# Patient Record
Sex: Male | Born: 1970 | Race: White | Hispanic: No | Smoking: Former smoker
Health system: Southern US, Community
[De-identification: ages and names within clinical notes are randomized; demographics above are authoritative.]

## PROBLEM LIST (undated history)

## (undated) DIAGNOSIS — Z800 Family history of malignant neoplasm of digestive organs: Secondary | ICD-10-CM

## (undated) DIAGNOSIS — R12 Heartburn: Secondary | ICD-10-CM

## (undated) DIAGNOSIS — R309 Painful micturition, unspecified: Secondary | ICD-10-CM

## (undated) DIAGNOSIS — D494 Neoplasm of unspecified behavior of bladder: Secondary | ICD-10-CM

## (undated) DIAGNOSIS — R42 Dizziness and giddiness: Secondary | ICD-10-CM

## (undated) DIAGNOSIS — C689 Malignant neoplasm of urinary organ, unspecified: Secondary | ICD-10-CM

## (undated) DIAGNOSIS — Z808 Family history of malignant neoplasm of other organs or systems: Secondary | ICD-10-CM

## (undated) DIAGNOSIS — R001 Bradycardia, unspecified: Secondary | ICD-10-CM

## (undated) DIAGNOSIS — F329 Major depressive disorder, single episode, unspecified: Secondary | ICD-10-CM

## (undated) DIAGNOSIS — F32A Depression, unspecified: Secondary | ICD-10-CM

## (undated) DIAGNOSIS — R972 Elevated prostate specific antigen [PSA]: Secondary | ICD-10-CM

## (undated) DIAGNOSIS — R319 Hematuria, unspecified: Secondary | ICD-10-CM

## (undated) DIAGNOSIS — C801 Malignant (primary) neoplasm, unspecified: Secondary | ICD-10-CM

## (undated) DIAGNOSIS — F419 Anxiety disorder, unspecified: Secondary | ICD-10-CM

## (undated) DIAGNOSIS — C61 Malignant neoplasm of prostate: Secondary | ICD-10-CM

## (undated) DIAGNOSIS — Z8042 Family history of malignant neoplasm of prostate: Secondary | ICD-10-CM

## (undated) HISTORY — DX: Family history of malignant neoplasm of digestive organs: Z80.0

## (undated) HISTORY — DX: Family history of malignant neoplasm of prostate: Z80.42

## (undated) HISTORY — DX: Malignant (primary) neoplasm, unspecified: C80.1

## (undated) HISTORY — DX: Family history of malignant neoplasm of other organs or systems: Z80.8

## (undated) HISTORY — DX: Dizziness and giddiness: R42

## (undated) HISTORY — DX: Malignant neoplasm of urinary organ, unspecified: C68.9

## (undated) HISTORY — DX: Bradycardia, unspecified: R00.1

---

## 1999-10-27 ENCOUNTER — Encounter: Payer: Self-pay | Admitting: Emergency Medicine

## 1999-10-28 ENCOUNTER — Inpatient Hospital Stay (HOSPITAL_COMMUNITY): Admission: EM | Admit: 1999-10-28 | Discharge: 1999-10-29 | Payer: Self-pay | Admitting: Emergency Medicine

## 1999-10-28 ENCOUNTER — Encounter: Payer: Self-pay | Admitting: Emergency Medicine

## 2003-09-14 ENCOUNTER — Emergency Department (HOSPITAL_COMMUNITY): Admission: EM | Admit: 2003-09-14 | Discharge: 2003-09-15 | Payer: Self-pay | Admitting: Emergency Medicine

## 2003-09-29 ENCOUNTER — Ambulatory Visit (HOSPITAL_COMMUNITY): Admission: RE | Admit: 2003-09-29 | Discharge: 2003-09-29 | Payer: Self-pay | Admitting: Neurological Surgery

## 2006-11-07 ENCOUNTER — Ambulatory Visit (HOSPITAL_COMMUNITY): Admission: RE | Admit: 2006-11-07 | Discharge: 2006-11-07 | Payer: Self-pay | Admitting: General Surgery

## 2006-11-07 ENCOUNTER — Encounter (INDEPENDENT_AMBULATORY_CARE_PROVIDER_SITE_OTHER): Payer: Self-pay | Admitting: General Surgery

## 2008-05-15 ENCOUNTER — Ambulatory Visit: Payer: Self-pay | Admitting: Vascular Surgery

## 2010-01-16 HISTORY — PX: LEG SURGERY: SHX1003

## 2010-01-16 HISTORY — PX: BACK SURGERY: SHX140

## 2010-03-06 ENCOUNTER — Emergency Department (HOSPITAL_COMMUNITY): Payer: PRIVATE HEALTH INSURANCE

## 2010-03-06 ENCOUNTER — Emergency Department (HOSPITAL_COMMUNITY)
Admission: EM | Admit: 2010-03-06 | Discharge: 2010-03-06 | Disposition: A | Discharge status: Other Acute Inpatient Hospital | Payer: PRIVATE HEALTH INSURANCE | Attending: Emergency Medicine | Admitting: Emergency Medicine

## 2010-03-06 ENCOUNTER — Inpatient Hospital Stay (HOSPITAL_COMMUNITY)
Admission: EM | Admit: 2010-03-06 | Discharge: 2010-03-14 | DRG: 516 | Disposition: A | Discharge status: Home / Self Care | Payer: PRIVATE HEALTH INSURANCE | Attending: General Surgery | Admitting: General Surgery

## 2010-03-06 ENCOUNTER — Encounter: Payer: Self-pay | Admitting: Orthopedic Surgery

## 2010-03-06 DIAGNOSIS — D62 Acute posthemorrhagic anemia: Secondary | ICD-10-CM | POA: Diagnosis present

## 2010-03-06 DIAGNOSIS — F1910 Other psychoactive substance abuse, uncomplicated: Secondary | ICD-10-CM | POA: Diagnosis present

## 2010-03-06 DIAGNOSIS — T148XXA Other injury of unspecified body region, initial encounter: Secondary | ICD-10-CM

## 2010-03-06 DIAGNOSIS — S73016A Posterior dislocation of unspecified hip, initial encounter: Secondary | ICD-10-CM

## 2010-03-06 DIAGNOSIS — S32409A Unspecified fracture of unspecified acetabulum, initial encounter for closed fracture: Principal | ICD-10-CM | POA: Diagnosis present

## 2010-03-06 DIAGNOSIS — S73006A Unspecified dislocation of unspecified hip, initial encounter: Secondary | ICD-10-CM | POA: Insufficient documentation

## 2010-03-06 DIAGNOSIS — S329XXA Fracture of unspecified parts of lumbosacral spine and pelvis, initial encounter for closed fracture: Secondary | ICD-10-CM | POA: Insufficient documentation

## 2010-03-06 DIAGNOSIS — S52123A Displaced fracture of head of unspecified radius, initial encounter for closed fracture: Secondary | ICD-10-CM | POA: Diagnosis present

## 2010-03-06 DIAGNOSIS — S5290XA Unspecified fracture of unspecified forearm, initial encounter for closed fracture: Secondary | ICD-10-CM | POA: Insufficient documentation

## 2010-03-06 DIAGNOSIS — Y92410 Unspecified street and highway as the place of occurrence of the external cause: Secondary | ICD-10-CM | POA: Insufficient documentation

## 2010-03-06 DIAGNOSIS — IMO0002 Reserved for concepts with insufficient information to code with codable children: Secondary | ICD-10-CM | POA: Diagnosis present

## 2010-03-06 DIAGNOSIS — Z7901 Long term (current) use of anticoagulants: Secondary | ICD-10-CM

## 2010-03-06 LAB — BASIC METABOLIC PANEL
CO2: 25 mEq/L (ref 19–32)
Chloride: 104 mEq/L (ref 96–112)
GFR calc Af Amer: >60 mL/min (ref >60)
Potassium: 4.1 mEq/L (ref 3.5–5.1)
Sodium: 136 mEq/L (ref 135–145)

## 2010-03-06 LAB — DIFFERENTIAL
Basophils Absolute: 0.1 10*3/uL (ref 0.0–0.1)
Basophils Relative: 0 % (ref 0–1)
Eosinophils Absolute: 0.1 10*3/uL (ref 0.0–0.7)
Monocytes Relative: 8 % (ref 3–12)
Neutro Abs: 14.3 10*3/uL — ABNORMAL HIGH (ref 1.7–7.7)
Neutrophils Relative %: 81 % — ABNORMAL HIGH (ref 43–77)

## 2010-03-06 LAB — APTT: aPTT: 28 seconds (ref 24–37)

## 2010-03-06 LAB — CBC
Hemoglobin: 14.1 g/dL (ref 13.0–17.0)
MCH: 33 pg (ref 26.0–34.0)
Platelets: 218 10*3/uL (ref 150–400)
RBC: 4.27 MIL/uL (ref 4.22–5.81)
WBC: 17.7 10*3/uL — ABNORMAL HIGH (ref 4.0–10.5)

## 2010-03-06 LAB — PROTIME-INR
INR: 0.93 (ref 0.00–1.49)
Prothrombin Time: 12.7 seconds (ref 11.6–15.2)

## 2010-03-06 LAB — TYPE AND SCREEN
ABO/RH(D): O POS
Antibody Screen: NEGATIVE

## 2010-03-06 MED ORDER — IOHEXOL 300 MG/ML  SOLN
100.0000 mL | Freq: Once | INTRAMUSCULAR | Status: AC | PRN
  Administered 2010-03-06: 100 mL via INTRAVENOUS

## 2010-03-07 ENCOUNTER — Other Ambulatory Visit (HOSPITAL_COMMUNITY): Payer: PRIVATE HEALTH INSURANCE

## 2010-03-07 ENCOUNTER — Encounter (HOSPITAL_COMMUNITY): Payer: Self-pay

## 2010-03-07 ENCOUNTER — Inpatient Hospital Stay (HOSPITAL_COMMUNITY): Payer: PRIVATE HEALTH INSURANCE

## 2010-03-07 LAB — CBC
Hemoglobin: 13.4 g/dL (ref 13.0–17.0)
MCH: 32.4 pg (ref 26.0–34.0)
MCHC: 33.8 g/dL (ref 30.0–36.0)
RDW: 12.6 % (ref 11.5–15.5)

## 2010-03-07 LAB — BASIC METABOLIC PANEL
CO2: 25 mEq/L (ref 19–32)
Calcium: 8.7 mg/dL (ref 8.4–10.5)
Creatinine, Ser: 0.91 mg/dL (ref 0.4–1.5)
GFR calc Af Amer: >60 mL/min (ref >60)
GFR calc non Af Amer: >60 mL/min (ref >60)
Glucose, Bld: 110 mg/dL — ABNORMAL HIGH (ref 70–99)
Sodium: 136 mEq/L (ref 135–145)

## 2010-03-08 ENCOUNTER — Inpatient Hospital Stay (HOSPITAL_COMMUNITY): Payer: PRIVATE HEALTH INSURANCE

## 2010-03-08 LAB — CBC
Hemoglobin: 13.6 g/dL (ref 13.0–17.0)
MCH: 32.9 pg (ref 26.0–34.0)
MCHC: 33.8 g/dL (ref 30.0–36.0)
Platelets: 191 10*3/uL (ref 150–400)
RBC: 4.14 MIL/uL — ABNORMAL LOW (ref 4.22–5.81)

## 2010-03-08 LAB — ABO/RH: ABO/RH(D): O POS

## 2010-03-08 LAB — MRSA PCR SCREENING: MRSA by PCR: NEGATIVE

## 2010-03-10 LAB — BASIC METABOLIC PANEL
BUN: 10 mg/dL (ref 6–23)
Calcium: 8.5 mg/dL (ref 8.4–10.5)
GFR calc non Af Amer: >60 mL/min (ref >60)
Glucose, Bld: 99 mg/dL (ref 70–99)
Sodium: 133 mEq/L — ABNORMAL LOW (ref 135–145)

## 2010-03-10 LAB — CBC
HCT: 31.9 % — ABNORMAL LOW (ref 39.0–52.0)
MCHC: 34.2 g/dL (ref 30.0–36.0)
Platelets: 186 10*3/uL (ref 150–400)
RDW: 12.3 % (ref 11.5–15.5)
WBC: 10.6 10*3/uL — ABNORMAL HIGH (ref 4.0–10.5)

## 2010-03-11 LAB — PROTIME-INR: Prothrombin Time: 14.5 seconds (ref 11.6–15.2)

## 2010-03-12 LAB — CROSSMATCH
ABO/RH(D): O POS
Unit division: 0

## 2010-03-12 LAB — PROTIME-INR: Prothrombin Time: 15.4 seconds — ABNORMAL HIGH (ref 11.6–15.2)

## 2010-03-14 LAB — CBC
HCT: 30.8 % — ABNORMAL LOW (ref 39.0–52.0)
MCH: 32.7 pg (ref 26.0–34.0)
MCV: 96 fL (ref 78.0–100.0)
RBC: 3.21 MIL/uL — ABNORMAL LOW (ref 4.22–5.81)
WBC: 8.4 10*3/uL (ref 4.0–10.5)
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
## 2010-03-18 NOTE — Consult Note (Signed)
  NAMEKELVIN, [name]:  [id number]  MEDICAL RECORD NO.:  [id number]           PATIENT TYPE:  LOCATION:                                 FACILITY:  PHYSICIAN:  Estill Bamberg, MD      DATE OF BIRTH:  1970/06/10  DATE OF CONSULTATION:  03/07/2010 DATE OF DISCHARGE:                                CONSULTATION   PREPROCEDURE DIAGNOSIS:  Unstable right posterior wall acetabular fracture dislocation.  POSTPROCEDURE DIAGNOSIS:  Unstable right posterior wall acetabular fracture dislocation.  PROCEDURE:  Skeletal traction pin placement right proximal tibia.  SURGEON:  Estill Bamberg, MD  CLINICIAN:  Mearl Latin, PA  COMPLICATIONS:  None.  Explained risks and benefits of skeletal traction with the patient.  The patient wished to proceed.  Identified, approach to the lateral proximal tibia.  Distal and posterior tibial tuberosity area cleaned with alcohol swab and Betadine swabs x3 both laterally and medially, then 6 mL of 1% lidocaine was infiltrated laterally.  After adequate anesthesia, at the soft tissue a 15-blade was used to make small vertical incisions for easier K-wire insertion.  A 0.062 K-wire was selected and inserted into the proximal tibia going lateral to medial, then traction bow was applied and 20 pounds applied to the traction bow creating in-line traction with slight hip flexion, abduction and external rotation to maximize stable position.  The patient tolerated the procedure well. Postprocedure examination demonstrated intact deep peroneal nerve, superficial peroneal nerve, and tibial nerve motor and sensory function. Palpable dorsalis pedis and posterior tibialis pulses were noted.  The patient had immediate onset of relief after weight was applied.  DISPOSITION:  Check CT scan of pelvis for postreduction evaluation of the acetabulum, OR tomorrow for ORIF of right acetabulum.     Mearl Latin,  PA   ______________________________ Estill Bamberg, MD   KWP/MEDQ  D:  03/07/2010  T:  03/08/2010  Job:  [id number]  Electronically Signed by Montez Morita PA on 03/15/2010 05:24:17 PM Electronically Signed by Loraine Leriche Ozan Maclay  on 03/18/2010 08:14:08 AM

## 2010-03-22 NOTE — Op Note (Addendum)
  Rodney [name], ENBERG NO.:  [id number]  MEDICAL RECORD NO.:  [id number]           PATIENT TYPE:  E  LOCATION:  APED                          FACILITY:  [facility]  PHYSICIAN:  Vickki Hearing, M.D.DATE OF BIRTH:  February 20, 1970  DATE OF PROCEDURE:  03/06/2010 DATE OF DISCHARGE:  03/06/2010                              OPERATIVE REPORT   PREOPERATIVE DIAGNOSIS:  Posterior fracture dislocation of the right hip.  PREOPERATIVE DIAGNOSIS:  Posterior fracture dislocation of the right hip.  PROCEDURE:  Closed reduction of the right hip dislocation in the emergency room under conscious sedation.  Anesthesia used was 50 of propofol and 3 of Versed.  Surgeon was Dr. Romeo Apple.  PREREDUCTION FINDINGS:  Posterior dislocation of the hip.  Neurovascular status of the limb was normal.  The patient held a limit flexion and internal rotation.  The limb was shortened.  The consent was obtained with a family member.  We did a time-out to check his name and then 3 mg of Versed was used and a 50 of propofol was used and his vitals were monitored  We then internally rotated the hip, adducted the hip, full traction, and the hip reduced with an audible clunk.  Leg lengths came back to neutral.  Postreduction film was obtained which showed improvement of position of the hip with the [facility] pelvis, we did not take a lateral.  We did not do a exam under anesthesia due to the timing.  The patient is being transported to [facility] where he can have a postreduction CT scan at the treating physician's discretion.    Vickki Hearing, M.D.    SEH/MEDQ  D:  03/06/2010  T:  03/07/2010  Job:  [id number]  Electronically Signed by Fuller Canada M.D. on 03/22/2010 11:40:31 AM Electronically Signed by Fuller Canada M.D. on 03/22/2010 11:55:19 AM Electronically Signed by Fuller Canada M.D. on 03/22/2010 12:09:47 PM Electronically Signed by Fuller Canada M.D. on 03/22/2010  12:25:20 PM Electronically Signed by Fuller Canada M.D. on 03/22/2010 12:41:55 PM Electronically Signed by Fuller Canada M.D. on 03/22/2010 01:00:02 PM Electronically Signed by Fuller Canada M.D. on 03/22/2010 01:19:31 PM Electronically Signed by Fuller Canada M.D. on 03/22/2010 01:40:57 PM Electronically Signed by Fuller Canada M.D. on 03/22/2010 02:00:59 PM Electronically Signed by Fuller Canada M.D. on 03/22/2010 02:21:15 PM Electronically Signed by Fuller Canada M.D. on 03/22/2010 02:43:37 PM Electronically Signed by Fuller Canada M.D. on 03/22/2010 03:07:08 PM Electronically Signed by Fuller Canada M.D. on 03/22/2010 03:31:59 PM Electronically Signed by Fuller Canada M.D. on 03/22/2010 04:06:08 PM Electronically Signed by Fuller Canada M.D. on 03/22/2010 04:37:33 PM Electronically Signed by Fuller Canada M.D. on 03/22/2010 05:09:04 PM Electronically Signed by Fuller Canada M.D. on 03/22/2010 05:09:04 PM Electronically Signed by Fuller Canada M.D. on 03/22/2010 05:37:49 PM Electronically Signed by Fuller Canada M.D. on 03/22/2010 06:27:05 PM Electronically Signed by Fuller Canada M.D. on 03/22/2010 07:32:31 PM
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
## 2010-03-29 NOTE — Consult Note (Signed)
Summary: Hospital consult RT posterior fx dislocation  Hospital consult RT posterior fx dislocation   Imported By: Cammie Sickle 03/22/2010 19:20:49  _____________________________________________________________________  External Attachment:    Type:   Image     Comment:   External Document
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
## 2010-05-31 NOTE — H&P (Signed)
Rodney [name], CANADY NO.:  [id number]   MEDICAL RECORD NO.:  [id number]          PATIENT TYPE:  AMB   LOCATION:  DAY                           FACILITY:  [facility]   PHYSICIAN:  Dalia Heading, M.D.  DATE OF BIRTH:  04-25-1970   DATE OF ADMISSION:  DATE OF DISCHARGE:  LH                              HISTORY & PHYSICAL   CHIEF COMPLAINT:  Anal condyloma.   HISTORY OF PRESENT ILLNESS:  The patient is a 40 year old white male who  is referred for evaluation and treatment of perianal condyloma.  He has  been followed for several years now.  Condylox does not help.  He now  presents for laser fulguration of the anal condyloma.   PAST MEDICAL HISTORY:  Includes chronic back problems.   PAST SURGICAL HISTORY:  Back surgery.   CURRENT MEDICATIONS:  Flexeril, Nebutane.   ALLERGIES:  NO KNOWN DRUG ALLERGIES.   REVIEW OF SYSTEMS:  This patient smokes a pack and a half of cigarettes  daily.  He denies any significant alcohol use.  He denies any other  __________  pulmonary difficulties or bleeding disorders.   PHYSICAL EXAMINATION:  GENERAL:  On physical admission, the patient is a  well-developed, nourished white male in no acute distress.  LUNGS:  Clear to auscultation with equal breath sounds bilaterally.  HEART:  Examination reveals a regular rate and rhythm without S3, S4, or  murmurs.  ABDOMEN:  The abdomen is benign.  RECTAL:  Examination reveals condyloma around the anal verge.   IMPRESSION:  Chronic anal condyloma.  The patient is scheduled for laser  fulguration of the anal condyloma on November 07, 2006.  Risks and  benefits of the procedure including bleeding, infection, and recurrence  of the condyloma were fully explained to the patient, gave informed  consent.      Dalia Heading, M.D.  Electronically Signed     MAJ/MEDQ  D:  11/01/2006  T:  11/02/2006  Job:  [id number]   cc:   Dalia Heading, M.D.  Fax: [phone]

## 2010-05-31 NOTE — Op Note (Signed)
NAMEHAYDYN, Rodney [name]:  [id number]   MEDICAL RECORD NO.:  [id number]          PATIENT TYPE:  AMB   LOCATION:  DAY                           FACILITY:  [facility]   PHYSICIAN:  Dalia Heading, M.D.  DATE OF BIRTH:  [birth year]   DATE OF PROCEDURE:  11/07/2006  DATE OF DISCHARGE:                               OPERATIVE REPORT   PREOPERATIVE DIAGNOSIS:  Perianal condyloma.   POSTOPERATIVE DIAGNOSIS:  Perianal condyloma.   PROCEDURE:  Laser fulguration of anal condyloma.   SURGEON:  Dalia Heading, M.D.   ANESTHESIA:  General endotracheal.   INDICATIONS:  The patient is a 40 year old white male who presents with  a perianal condyloma.  The risks and benefits of the procedure including  the recurrence of the condyloma were fully explained to the patient who  gave informed consent.   PROCEDURE NOTE:  The patient was placed in the lithotomy position.  After general anesthesia was administered, the perineum was prepped and  draped using the usual sterile technique.  Dampened towels were placed  around the anal region.  Surgical site confirmation was performed.   The patient had multiple condyloma in the perianal region as well as the  anal verge.  Using both Bovie electrocautery for the larger lesions and  the Holmium laser, the areas were fulgurated without difficulty.  No  abnormal bleeding was noted at the end of the procedure.  The patient  was awakened in the operating room and went back to the recovery room  awake in stable condition.   COMPLICATIONS:  None.   SPECIMEN:  Perianal condyloma.   BLOOD LOSS:  Minimal.      Dalia Heading, M.D.  Electronically Signed    MAJ/MEDQ  D:  11/07/2006  T:  11/07/2006  Job:  [id number]
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
## 2010-06-03 NOTE — Op Note (Signed)
Rodney [name], Rodney [name]                           ACCOUNT NO.:  [id number]   MEDICAL RECORD NO.:  [id number]                   PATIENT TYPE:  OIB   LOCATION:  2899                                 FACILITY:  [facility]   PHYSICIAN:  Stefani Dama, M.D.               DATE OF BIRTH:  Sep 25, 1970   DATE OF PROCEDURE:  09/29/2003  DATE OF DISCHARGE:                                 OPERATIVE REPORT   PREOPERATIVE DIAGNOSES:  Herniated nucleus pulposus at L5-S1, with right S1  radiculopathy.   POSTOPERATIVE DIAGNOSES:  Herniated nucleus pulposus at L5-S1, with right S1  radiculopathy.   PROCEDURE:  L5-S1 MET-RX laminotomy and diskectomy, with operating  microscope, microdissection technique.   SURGEON:  Stefani Dama, M.D.   ASSISTANT:  Hilda Lias, M.D.   ANESTHESIA:  General endotracheal.   INDICATIONS FOR PROCEDURE:  The patient is a 40 year old individual who has  had significant back and right lower extremity pain that came on acutely a  couple of weeks ago.  He has been refractory to conservative measures, and  has been taking large doses of narcotic pain medication, to maintain some  level of comfort.  He has some modest weakness in the gastrocnemius on the  right side, and after careful evaluation, he was advised regarding surgical  decompression of the L5-S1 space on the right side with the use of MET-RX  diskectomy.  He has had a number of previous back operations and has been on  disability because of these problems.   DESCRIPTION OF PROCEDURE:  The patient was brought to the operating room  supine on the stretcher.  After the smooth induction of general endotracheal  anesthesia, he was turned prone.  His back was prepped with Hibiclens and  alcohol, and then draped in a sterile fashion.  Using fluoroscopic guidance  then the L5-S1 space was localized on the left side, and the skin was marked  over the appropriate areas.  It was infiltrated with 1% lidocaine with  epinephrine mixed 50:50 with 0.5% Marcaine.  The skin was incised and the K-  wire was then passed to the laminar arch of L5 using a wanding technique.  A  series of dilators were then passed into this area to allow for the  placement of an 18 mm x 5 cm deep endoscopic cannula, which was affixed to  the operating room table.  The position of the cannula was directly over the  L5-S1 interspace on that right side.  Then the microscope was draped and  brought into the field and through the microscopic aperture, the soft  tissues were dissected from the laminar arch and the internal laminar space.  The 2 and 3 mm Kerrison punches were then used to elevate the laminar arch  and to carefully dissect the redundant yellow ligament under this region.  Under this the common dural tube was identified on the lateral  aspect.  The  S1 nerve root was noted to be dorsally.  The nerve root was invested with a  fair amount of scar tissue, as though there had been a previous laminotomy  in this area.  This was dissected out to the lateral aspect of the nerve  root, and then by working from lateral to medial, the nerve root was well-  protected, dissected carefully, and elevated and turned medially.  The  underlying surface was then identified as a glistening whitish area which  was incised.  Through this aperture then a large fragment of disk material  was removed, which allowed for immediate decompression of the common dural  tube and the nerve root.  Further dissection revealed a second fragment,  which was somewhat smaller.  Further exploration revealed no other fragments  of disk; however, there was an opening into the disk space.  This was  enlarged again with a #15 blade, and a combination of curets and rongeurs  were used to evacuate the disk space at L5-S1 of a substantial quantity of  severely degenerated disk material.  In the end, the space was free and  clear of any disk material.  The common dural  tube and the S1 nerve root,  which were quite separate in the way they laid, were well-decompressed.  Hemostasis in the soft tissues was achieved, and then the endoscopic cannula  was removed.  The fascia was closed with singular #3-0 Vicryl suture, and #3-  0 Vicryl was used in the subcuticular skin.  The patient tolerated the procedure well and was returned to the recovery  room.  The procedure was done with the help of Dr. Jeral Fruit, who provided some  retraction to the common dural tube and the S1 nerve root.  He also  performed the lateral portion of the dissection, decompressing the  undersurface of the S1 nerve root towards the lateral aspect of the  exposure.                                               Stefani Dama, M.D.    [name]  D:  09/29/2003  T:  09/29/2003  Job:  [id number]

## 2010-06-03 NOTE — H&P (Signed)
[place]  Patient:    Rodney [name], Rodney [name]                        MRN: [id number] Adm. Date:  [id number] Disc. Date: [id number] Attending:  Molpus, John L                         History and Physical  ADMITTING DIAGNOSIS:  Herniated nucleus pulposus L5-S1 right with right lumbar radiculopathy.  HISTORY OF PRESENT ILLNESS:  The patient is a 40 year old right-handed individual who has had back pain since a work-related injury on August 12. He was seen by Dr. Annett Gula in [place] and told that he had primarily musculoligamentous strain. He was treated with bed rest and some medications. About a week and a half ago he started to develop severe and excruciating pain in his right lower extremity. This has progressed to the point where he came to the emergency room, because he could not obtain any relief. He had called our office and obtained an appointment with Dr. Channing Mutters, to be seen the following week. However, the patient notes that he cannot bear any weight, he cannot move about at all. He denies bowel or bladder control problems, but he has been essentially incapacitated. Yesterday, in the emergency room, an MRI of the lumbar spine was performed, and this demonstrates the presence of postsurgical changes on the left at L4-5, and a new moderate to large size disc herniation on the right at L5-S1.  PAST MEDICAL HISTORY:  His general health has been good.  PAST SURGICAL HISTORY:  His only other surgery has been a laminotomy and discectomy in 1997, and a vasectomy ten years ago.  ALLERGIES:  He notes no allergies to any medications.  MEDICATIONS:  He uses no medications on a chronic basis.  SOCIAL HISTORY:  He is engaged to be married. 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 In fact, a work-related injury was while he was using a jackhammer.  FAMILY HISTORY:  Noncontributory for any significant problems with back problems or disc disease.  REVIEW OF  SYSTEMS:  Notable only for the items in the history of present illness. He denies bowel or bladder control problems. He notes that he smokes about a pack of cigarettes a day. He denies any upper airway disease. A 14 point systems review is negative.  PHYSICAL EXAMINATION:  VITAL SIGNS:  Blood pressure 130/67, heart rate 54, respirations 16, temperature 97.1 degrees.  GENERAL:  He is a well-developed, well-nourished individual in moderate distress when he moves about with his back.  BACK:  He has a significant amount of paravertebral spasm and marked tenderness to palpation and percussion in his paravertebral muscles.  NEUROLOGIC:  When attempting to stand him he will not bear weight on his right lower extremity. He has a severe amount of paravertebral spasm on the right side with a functional scoliosis. His motor strength in the lower extremities reveals the iliopsoas, quadriceps, tibialis anterior and gastrocnemius to have good strength to confrontational testing. However, his Achilles reflex on the right side is absent. His patellar reflexes are 2+. Upper extremity reflexes are 2+, upper extremity strength and reflexes are otherwise normal. Cranial nerve examination reveals his pupils are 4 mm, brisk and reactive to light and accommodation. Extraocular movements are full. The face is symmetric to grimace. The tongue and uvula are in the midline. Sclera and conjunctiva are clear. Fundi reveal the disks to  be flat bilaterally.  HEART:  Regular rate and rhythm. No murmurs are heard.  LUNGS:  Clear to auscultation.  NECK:  No masses. No bruits heard.  EXTREMITIES:  Reveal no cyanosis, clubbing or edema, otherwise.  IMPRESSION:  The patient has evidence of herniated nucleus pulposus at L5-S1 on the right side.  PLAN:  He is now being admitted and will likely require surgical intervention emergently as he has not had any relief with parenteral medications, and he has been  progressively worsening over the past week and a half time at home. DD:  10/28/99 TD:  10/28/99 Job: [id number]

## 2010-06-03 NOTE — Op Note (Signed)
[place]  Patient:    Rodney [name], Rodney [name]                        MRN: [id number] Proc. Date: 10/28/99 Adm. Date:  [id number] Disc. Date: [id number] Attending:  Molpus, John L                           Operative Report  PREOPERATIVE DIAGNOSIS:  L5-S1 herniated nucleus pulposus on the right, with right lumbar radiculopathy.  POSTOPERATIVE DIAGNOSIS:  L5-S1 herniated nucleus pulposus on the right, with right lumbar radiculopathy.  PROCEDURE:  Right lumbar microdiskectomy, with microdissection technique and surgical microscope, L5-S1 right.  SURGEON:  Stefani Dama, M.D.  FIRST ASSISTANT:  Danae Orleans. Venetia Maxon, M.D.  ANESTHESIA:  General endotracheal.  INDICATIONS:  The patient is a 40 year old individual who has had significant back and right lower extremity pain.  He came to the emergency room yesterday complaining that he could not walk.  An MRI demonstrated the presence of a herniated nucleus pulposus at L5-S1 on the right side.  He was advised regarding surgery.  DESCRIPTION OF PROCEDURE:  The patient was brought to the operating room supine on a stretcher.  After a smooth induction of general endotracheal anesthesia he was turned onto the back, shaved and prepped with Duraprep and draped in a sterile fashion.  A midline incision was created and carried down to the lumbodorsal fascia on the right side.  The fascia was opened on the right side over a low spinous process.  The interlaminar space was cleared, and the L5-S1 space was identified positively, after needle placement identified the L4 interlaminar space.  McCullough retractor was used.  The dissection was extended to include area around the facet.  The margin of the lamina of L5 was then cleared using a Midas Rex and an AM-3 bur.  The yellow ligament was taken up in this region, and the interlaminar space and the common dural tube was identified.  The area was explored by using  microdissection technique, with the operating microscope to relieve epidural veins in this region and expose the common dural tube and the L5-S1 nerve root.  The S1 nerve root was noted to be bulged over a mass ventral to it.  When this was dissected free and moved medially, the mass was noted to be a portion of disk.  After some dissection, several fragments of disk were removed from outside the disk space and the free space underneath the nerve.  Further dissection identified that there was significant disk material that was loose within the disk space.  The disk space was cleaned with a combination of curettage and rongeurs, used to remove significant quantity of degenerative disk material from within it.  Once this was cleared, the area was copiously irrigated with antibiotic irrigating solution.  Several other small fragments of disk were identified laterally.  These were cleared using a combination of curets and punches. Then hemostasis was again achieved.  The area was then copiously irrigated again.  Hemostasis was doubly checked and then the microscope was brought out of the field, and the lumbodorsal fascia was closed with #1 Vicryl in interrupted fashion; 3-0 Vicryl was used in the subcuticular tissues.  A clear plastic dressing was placed on the skin.  The patient tolerated the procedure well.  Blood loss was estimated at less than 50 cc.DD:  10/28/99 TD:  10/30/99 Job: [id number]  [id number]

## 2010-10-26 LAB — BASIC METABOLIC PANEL
CO2: 28
Chloride: 102
GFR calc Af Amer: >60
Glucose, Bld: 92
Sodium: 136

## 2010-10-26 LAB — CBC
HCT: 41.2
Hemoglobin: 14.4
MCHC: 35
MCV: 96.7
RBC: 4.26

## 2011-06-30 ENCOUNTER — Encounter: Payer: Self-pay | Admitting: *Deleted

## 2014-09-18 ENCOUNTER — Encounter (HOSPITAL_COMMUNITY): Payer: Self-pay | Admitting: Emergency Medicine

## 2014-09-18 ENCOUNTER — Emergency Department (HOSPITAL_COMMUNITY): Payer: PRIVATE HEALTH INSURANCE

## 2014-09-18 ENCOUNTER — Emergency Department (HOSPITAL_COMMUNITY)
Admission: EM | Admit: 2014-09-18 | Discharge: 2014-09-18 | Disposition: A | Discharge status: Home / Self Care | Payer: PRIVATE HEALTH INSURANCE | Attending: Emergency Medicine | Admitting: Emergency Medicine

## 2014-09-18 DIAGNOSIS — S300XXD Contusion of lower back and pelvis, subsequent encounter: Secondary | ICD-10-CM | POA: Diagnosis not present

## 2014-09-18 DIAGNOSIS — W132XXD Fall from, out of or through roof, subsequent encounter: Secondary | ICD-10-CM | POA: Diagnosis not present

## 2014-09-18 DIAGNOSIS — Z87891 Personal history of nicotine dependence: Secondary | ICD-10-CM | POA: Insufficient documentation

## 2014-09-18 DIAGNOSIS — Z9889 Other specified postprocedural states: Secondary | ICD-10-CM | POA: Insufficient documentation

## 2014-09-18 DIAGNOSIS — S3992XD Unspecified injury of lower back, subsequent encounter: Secondary | ICD-10-CM | POA: Diagnosis present

## 2014-09-18 DIAGNOSIS — S3993XA Unspecified injury of pelvis, initial encounter: Secondary | ICD-10-CM

## 2014-09-18 MED ORDER — DIAZEPAM 5 MG PO TABS
10.0000 mg | ORAL_TABLET | Freq: Once | ORAL | Status: AC
  Administered 2014-09-18: 10 mg via ORAL
  Filled 2014-09-18: qty 2

## 2014-09-18 MED ORDER — OXYCODONE-ACETAMINOPHEN 5-325 MG PO TABS
2.0000 | ORAL_TABLET | Freq: Once | ORAL | Status: DC
  Filled 2014-09-18: qty 2

## 2014-09-18 MED ORDER — KETOROLAC TROMETHAMINE 10 MG PO TABS
10.0000 mg | ORAL_TABLET | Freq: Once | ORAL | Status: AC
  Administered 2014-09-18: 10 mg via ORAL
  Filled 2014-09-18: qty 1

## 2014-09-18 MED ORDER — IOHEXOL 300 MG/ML  SOLN: 75.0000 mL | Freq: Once | INTRAMUSCULAR | Status: DC | PRN
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
## 2017-06-21 ENCOUNTER — Other Ambulatory Visit: Payer: Self-pay | Admitting: Urology

## 2017-06-28 NOTE — Patient Instructions (Addendum)
Rodney [name]  06/28/2017   Your procedure is scheduled on: 07-02-17    Report to [facility] Main  Entrance    Report to admitting at 2:30PM    Call this number if you have problems the morning of surgery [phone]     Remember: Do not eat food After Midnight. You may have clear liquids from midnight until 11am day of surgery . Nothing after 11am!      CLEAR LIQUID DIET   Foods Allowed                                                                     Foods Excluded  Coffee and tea, regular and decaf                             liquids that you cannot  Plain Jell-O in any flavor                                             see through such as: Fruit ices (not with fruit pulp)                                     milk, soups, orange juice  Iced Popsicles                                    All solid food Carbonated beverages, regular and diet                                    Cranberry, grape and apple juices Sports drinks like Gatorade Lightly seasoned clear broth or consume(fat free) Sugar, honey syrup  Sample Menu Breakfast                                Lunch                                     Supper Cranberry juice                    Beef broth                            Chicken broth Jell-O                                     Grape juice                           Apple  juice Coffee or tea                        Jell-O                                      Popsicle                                                Coffee or tea                        Coffee or tea  _____________________________________________________________________       Take these medicines the morning of surgery with A SIP OF WATER: none                                 You may not have any metal on your body including hair pins and              piercings  Do not wear jewelry, make-up, lotions, powders or perfumes, deodorant                          Men may shave face  and neck.   Do not bring valuables to the hospital. [place].  Contacts, dentures or bridgework may not be worn into surgery.       Patients discharged the day of surgery will not be allowed to drive home.  Name and phone number of your driver:  Special Instructions: N/A              Please read over the following fact sheets you were given: _____________________________________________________________________             [facility] - Preparing for Surgery Before surgery, you can play an important role.  Because skin is not sterile, your skin needs to be as free of germs as possible.  You can reduce the number of germs on your skin by washing with CHG (chlorahexidine gluconate) soap before surgery.  CHG is an antiseptic cleaner which kills germs and bonds with the skin to continue killing germs even after washing. Please DO NOT use if you have an allergy to CHG or antibacterial soaps.  If your skin becomes reddened/irritated stop using the CHG and inform your nurse when you arrive at Short Stay. Do not shave (including legs and underarms) for at least 48 hours prior to the first CHG shower.  You may shave your face/neck. Please follow these instructions carefully:  1.  Shower with CHG Soap the night before surgery and the  morning of Surgery.  2.  If you choose to wash your hair, wash your hair first as usual with your  normal  shampoo.  3.  After you shampoo, rinse your hair and body thoroughly to remove the  shampoo.                           4.  Use CHG as you would any other liquid  soap.  You can apply chg directly  to the skin and wash                       Gently with a scrungie or clean washcloth.  5.  Apply the CHG Soap to your body ONLY FROM THE NECK DOWN.   Do not use on face/ open                           Wound or open sores. Avoid contact with eyes, ears mouth and genitals (private parts).                       Wash face,   Genitals (private parts) with your normal soap.             6.  Wash thoroughly, paying special attention to the area where your surgery  will be performed.  7.  Thoroughly rinse your body with warm water from the neck down.  8.  DO NOT shower/wash with your normal soap after using and rinsing off  the CHG Soap.                9.  Pat yourself dry with a clean towel.            10.  Wear clean pajamas.            11.  Place clean sheets on your bed the night of your first shower and do not  sleep with pets. Day of Surgery : Do not apply any lotions/deodorants the morning of surgery.  Please wear clean clothes to the hospital/surgery center.  FAILURE TO FOLLOW THESE INSTRUCTIONS MAY RESULT IN THE CANCELLATION OF YOUR SURGERY PATIENT SIGNATURE_________________________________  NURSE SIGNATURE__________________________________  ________________________________________________________________________

## 2017-06-29 ENCOUNTER — Encounter (HOSPITAL_COMMUNITY)
Admission: RE | Admit: 2017-06-29 | Discharge: 2017-06-29 | Disposition: A | Discharge status: Home / Self Care | Payer: PRIVATE HEALTH INSURANCE | Source: Ambulatory Visit | Attending: Urology | Admitting: Urology

## 2017-06-29 ENCOUNTER — Encounter (HOSPITAL_COMMUNITY): Payer: Self-pay

## 2017-06-29 ENCOUNTER — Other Ambulatory Visit: Payer: Self-pay

## 2017-06-29 DIAGNOSIS — D414 Neoplasm of uncertain behavior of bladder: Secondary | ICD-10-CM | POA: Diagnosis not present

## 2017-06-29 DIAGNOSIS — Z886 Allergy status to analgesic agent status: Secondary | ICD-10-CM | POA: Diagnosis not present

## 2017-06-29 DIAGNOSIS — D494 Neoplasm of unspecified behavior of bladder: Secondary | ICD-10-CM | POA: Diagnosis present

## 2017-06-29 DIAGNOSIS — Z8249 Family history of ischemic heart disease and other diseases of the circulatory system: Secondary | ICD-10-CM | POA: Diagnosis not present

## 2017-06-29 DIAGNOSIS — N3080 Other cystitis without hematuria: Secondary | ICD-10-CM | POA: Diagnosis not present

## 2017-06-29 DIAGNOSIS — Z87891 Personal history of nicotine dependence: Secondary | ICD-10-CM | POA: Diagnosis not present

## 2017-06-29 DIAGNOSIS — Z885 Allergy status to narcotic agent status: Secondary | ICD-10-CM | POA: Diagnosis not present

## 2017-06-29 DIAGNOSIS — Z8042 Family history of malignant neoplasm of prostate: Secondary | ICD-10-CM | POA: Diagnosis not present

## 2017-06-29 DIAGNOSIS — C672 Malignant neoplasm of lateral wall of bladder: Secondary | ICD-10-CM | POA: Diagnosis not present

## 2017-06-29 HISTORY — DX: Major depressive disorder, single episode, unspecified: F32.9

## 2017-06-29 HISTORY — DX: Painful micturition, unspecified: R30.9

## 2017-06-29 HISTORY — DX: Depression, unspecified: F32.A

## 2017-06-29 HISTORY — DX: Heartburn: R12

## 2017-06-29 HISTORY — DX: Anxiety disorder, unspecified: F41.9

## 2017-06-29 HISTORY — DX: Elevated prostate specific antigen (PSA): R97.20

## 2017-06-29 HISTORY — DX: Hematuria, unspecified: R31.9

## 2017-06-29 HISTORY — DX: Neoplasm of unspecified behavior of bladder: D49.4

## 2017-06-29 LAB — BASIC METABOLIC PANEL
Anion gap: 5 (ref 5–15)
BUN: 17 mg/dL (ref 6–20)
CHLORIDE: 109 mmol/L (ref 101–111)
CO2: 27 mmol/L (ref 22–32)
CREATININE: 1.04 mg/dL (ref 0.61–1.24)
Calcium: 9.6 mg/dL (ref 8.9–10.3)
GFR calc Af Amer: >60 mL/min (ref >60)
GFR calc non Af Amer: >60 mL/min (ref >60)
Glucose, Bld: 98 mg/dL (ref 65–99)
Potassium: 4.9 mmol/L (ref 3.5–5.1)
Sodium: 141 mmol/L (ref 135–145)

## 2017-06-29 LAB — CBC
HEMATOCRIT: 42.3 % (ref 39.0–52.0)
HEMOGLOBIN: 14.3 g/dL (ref 13.0–17.0)
MCH: 32.6 pg (ref 26.0–34.0)
MCHC: 33.8 g/dL (ref 30.0–36.0)
MCV: 96.4 fL (ref 78.0–100.0)
Platelets: 257 10*3/uL (ref 150–400)
RBC: 4.39 MIL/uL (ref 4.22–5.81)
RDW: 12.4 % (ref 11.5–15.5)
WBC: 7 10*3/uL (ref 4.0–10.5)

## 2017-06-29 NOTE — H&P (Signed)
Office Visit Report     06/21/2017   --------------------------------------------------------------------------------   Rodney [name]  MRN: [id number]  PRIMARY CARE:  [facility]  DOB: 07-21-1970, 47 year old Male  REFERRING:  [facility]  SSN:   PROVIDER:  Raynelle Bring, M.D.    LOCATION:  [facility] [phone]   --------------------------------------------------------------------------------   CC/HPI: Bladder mass   [name] is a 47 year old gentleman seen today at the request of Dr. Judd Lien. Approximately 10 days ago, he noticed painless gross hematuria. This subsequently has cleared. However, he did present to his primary care office in underwent a CT scan of the abdomen and pelvis with contrast. This did not demonstrate any obvious upper tract abnormalities except for a simple appearing renal cysts. No noncontrast images were performed. Incidentally, within the bladder, a hyperdense 2 cm lesion on the right side of the bladder was noted concerning for a potential bladder tumor. He denies any other associated symptoms. He did smoke 2 packs of cigarettes per day for 15 years before quitting 1 year ago. He is otherwise completely healthy. He also informs me that his brother who is 64 was just diagnosed prostate cancer in his getting scheduled to undergo prostate surgery.     ALLERGIES: Hydrocodone-Acetaminophen Tramadol    MEDICATIONS: Percocet 7.5 mg-325 mg tablet     GU PSH: None   NON-GU PSH: Back Surgery (Unspecified) Leg surgery (unspecified)    GU PMH: None   NON-GU PMH: None   FAMILY HISTORY: 2 daughters - Runs in Family Aneurysm - Mother Heart Attack - Father   SOCIAL HISTORY: Marital Status: Single Preferred Language: English; Ethnicity: Not Hispanic Or Latino; Race: White Current Smoking Status: Patient does not smoke anymore. Has not smoked since 06/16/2016. Smoked for 15 years. Smoked 2 packs per  day.   Tobacco Use Assessment Completed: Used Tobacco in last 30 days? Does drink.  Drinks 4+ caffeinated drinks per day.    REVIEW OF SYSTEMS:    GU Review Male:   Patient denies frequent urination, hard to postpone urination, burning/ pain with urination, get up at night to urinate, leakage of urine, stream starts and stops, trouble starting your streams, and have to strain to urinate .  Gastrointestinal (Lower):   Patient denies diarrhea and constipation.  Gastrointestinal (Upper):   Patient denies nausea and vomiting.  Constitutional:   Patient denies fever, night sweats, weight loss, and fatigue.  Skin:   Patient denies skin rash/ lesion and itching.  Eyes:   Patient reports blurred vision. Patient denies double vision.  Ears/ Nose/ Throat:   Patient denies sore throat and sinus problems.  Hematologic/Lymphatic:   Patient denies swollen glands and easy bruising.  Cardiovascular:   Patient denies leg swelling and chest pains.  Respiratory:   Patient denies cough and shortness of breath.  Endocrine:   Patient denies excessive thirst.  Musculoskeletal:   Patient reports back pain. Patient denies joint pain.  Neurological:   Patient denies headaches and dizziness.  Psychologic:   Patient reports anxiety and depression.    VITAL SIGNS:      06/21/2017 01:53 PM  Weight 196 lb / 88.9 kg  Height 73 in / 185.42 cm  BP 131/82 mmHg  Pulse 57 /min  BMI 25.9 kg/m   GU PHYSICAL EXAMINATION:    Anus and Perineum: No hemorrhoids. No anal stenosis. No rectal fissure, no anal fissure. No edema, no dimple, no perineal tenderness, no anal tenderness.  Scrotum: No lesions. No edema. No cysts. No warts.  Epididymides: Right: no spermatocele, no masses, no cysts, no tenderness, no induration, no enlargement. Left: no spermatocele, no masses, no cysts, no tenderness, no induration, no enlargement.  Testes: No tenderness, no swelling, no enlargement left testes. No tenderness, no swelling, no  enlargement right testes. Normal location left testes. Normal location right testes. No mass, no cyst, no varicocele, no hydrocele left testes. No mass, no cyst, no varicocele, no hydrocele right testes.  Urethral Meatus: Normal size. No lesion, no wart, no discharge, no polyp. Normal location.  Penis: Circumcised, no warts, no cracks. No dorsal Peyronie's plaques, no left corporal Peyronie's plaques, no right corporal Peyronie's plaques, no scarring, no warts. No balanitis, no meatal stenosis.  Prostate: Prostate about 30 grams. Left lobe normal consistency, right lobe normal consistency. Symmetrical lobes. No prostate nodule. Left lobe no tenderness, right lobe no tenderness.   Seminal Vesicles: Nonpalpable.  Sphincter Tone: Normal sphincter. No rectal tenderness. No rectal mass.    MULTI-SYSTEM PHYSICAL EXAMINATION:    Constitutional: Well-nourished. No physical deformities. Normally developed. Good grooming.  Neck: Neck symmetrical, not swollen. Normal tracheal position.  Respiratory: No labored breathing, no use of accessory muscles. Normal breath sounds. Clear bilaterally.  Cardiovascular: Regular rate and rhythm. No murmur, no gallop. Normal temperature, normal extremity pulses, no swelling, no varicosities.  Lymphatic: No enlargement of neck, axillae, groin.  Skin: No paleness, no jaundice, no cyanosis. No lesion, no ulcer, no rash.  Neurologic / Psychiatric: Oriented to time, oriented to place, oriented to person. No depression, no anxiety, no agitation.  Gastrointestinal: No mass, no tenderness, no rigidity, non obese abdomen.  Eyes: Normal conjunctivae. Normal eyelids.  Ears, Nose, Mouth, and Throat: Left ear no scars, no lesions, no masses. Right ear no scars, no lesions, no masses. Nose no scars, no lesions, no masses. Normal hearing. Normal lips.  Musculoskeletal: Normal gait and station of head and neck.     PAST DATA REVIEWED:  Source Of History:  Patient  Records Review:    Previous Patient Records  Urine Test Review:   Urinalysis  X-Ray Review: C.T. Abdomen/Pelvis: Reviewed Films.     PROCEDURES:         Flexible Cystoscopy - 52000  Indication: Bladder mass Risks, benefits, and potential complications of the procedure were discussed with the patient including infection, bleeding, voiding discomfort, urinary retention, fever, chills, sepsis, and others. All questions were answered. Informed consent was obtained. Sterile technique and intraurethral analgesia were used.  Meatus:  Normal size. Normal location. Normal condition.  Urethra:  No strictures.  External Sphincter:  Normal.  Verumontanum:  Normal.  Prostate:  Non-obstructing. No hyperplasia.  Bladder Neck:  At the neck of his bladder, there was noted be a small polyp with overlying smooth mucosa extending from the right, lateral aspect of the bladder neck. This did not appear suspicious for malignancy.  Ureteral Orifices:  Normal location. Normal size. Normal shape. Effluxed clear urine.  Bladder:  Systematic examination of the bladder was performed. Just beyond the right ureteral orifice, a papillary 2 cm tumor was identified suspicious for malignancy. No other bladder tumors, stones, or other mucosal pathology was identified. A bladder washing was obtained for cytology.      Chaperone: AJ The procedure was well-tolerated and without complications. Instructions were given to call the office immediately if questions or problems.         Urinalysis Dipstick Dipstick Cont'd  Color: Yellow Bilirubin: Neg  Appearance:  Clear Ketones: Neg  Specific Gravity: 1.010 Blood: Neg  pH: 7.5 Protein: Neg  Glucose: Neg Urobilinogen: 0.2    Nitrites: Neg    Leukocyte Esterase: Neg    ASSESSMENT:      ICD-10 Details  1 GU:   Family Hx of Prostate Cancer - Z80.42   2   Bladder tumor/neoplasm - D41.4    PLAN:           Orders Labs Urine Cytology, PSA with Reflex          Schedule Return Visit/Planned  Activity: Other See Visit Notes             Note: Will call to schedule surgery.          Document Letter(s):  Created for Patient: Clinical Summary         Notes:   1. Bladder tumor: I discussed his cystoscopic and imaging findings with him today. He does have a 2 cm tumor that appears suspicious for a bladder malignancy. A bladder washing has been obtained for cytology. I recommended that he proceed with cystoscopy, pelvic exam under anesthesia, bilateral retrograde pyelography, and transurethral resection of his bladder tumor with possible postoperative intravesical installation of chemotherapy. We have discussed the potential risks, complications, and the expected recovery process associated with this procedure. He gives informed consent to proceed. This will be scheduled for the near future.   2. Family history of prostate cancer: He did have a PSA drawn today prior to cystoscopy. He will be notified of his results.   Cc: Dr. Judd Lien  [facility]         Next Appointment:      Next Appointment: 07/02/2017 05:00 PM    Appointment Type: Surgery     Location: [facility] [phone]    Provider: Raynelle Bring, M.D.    Reason for Visit: WL/OP CYSTO, EUA, BIL RPG, TURBT, POSS MITOMYCIN INSTILL      * Signed by Raynelle Bring, M.D. on 06/21/17 at 8:18 PM (EDT*

## 2017-07-02 ENCOUNTER — Ambulatory Visit (HOSPITAL_COMMUNITY): Payer: PRIVATE HEALTH INSURANCE | Admitting: Anesthesiology

## 2017-07-02 ENCOUNTER — Encounter (HOSPITAL_COMMUNITY): Payer: Self-pay

## 2017-07-02 ENCOUNTER — Ambulatory Visit (HOSPITAL_COMMUNITY): Payer: PRIVATE HEALTH INSURANCE

## 2017-07-02 ENCOUNTER — Encounter (HOSPITAL_COMMUNITY)
Admission: RE | Disposition: A | Discharge status: Home / Self Care | Payer: Self-pay | Source: Ambulatory Visit | Attending: Urology

## 2017-07-02 ENCOUNTER — Ambulatory Visit (HOSPITAL_COMMUNITY)
Admission: RE | Admit: 2017-07-02 | Discharge: 2017-07-02 | Disposition: A | Discharge status: Home / Self Care | Payer: PRIVATE HEALTH INSURANCE | Source: Ambulatory Visit | Attending: Urology | Admitting: Urology

## 2017-07-02 DIAGNOSIS — C672 Malignant neoplasm of lateral wall of bladder: Secondary | ICD-10-CM | POA: Diagnosis not present

## 2017-07-02 DIAGNOSIS — N3080 Other cystitis without hematuria: Secondary | ICD-10-CM | POA: Insufficient documentation

## 2017-07-02 DIAGNOSIS — D414 Neoplasm of uncertain behavior of bladder: Secondary | ICD-10-CM | POA: Insufficient documentation

## 2017-07-02 DIAGNOSIS — Z87891 Personal history of nicotine dependence: Secondary | ICD-10-CM | POA: Insufficient documentation

## 2017-07-02 DIAGNOSIS — Z885 Allergy status to narcotic agent status: Secondary | ICD-10-CM | POA: Insufficient documentation

## 2017-07-02 DIAGNOSIS — Z8249 Family history of ischemic heart disease and other diseases of the circulatory system: Secondary | ICD-10-CM | POA: Insufficient documentation

## 2017-07-02 DIAGNOSIS — Z886 Allergy status to analgesic agent status: Secondary | ICD-10-CM | POA: Insufficient documentation

## 2017-07-02 DIAGNOSIS — Z8042 Family history of malignant neoplasm of prostate: Secondary | ICD-10-CM | POA: Insufficient documentation

## 2017-07-02 HISTORY — PX: CYSTOSCOPY W/ RETROGRADES: SHX1426

## 2017-07-02 HISTORY — PX: TRANSURETHRAL RESECTION OF BLADDER TUMOR: SHX2575

## 2017-07-02 SURGERY — CYSTOSCOPY, WITH RETROGRADE PYELOGRAM
Anesthesia: General

## 2017-07-02 MED ORDER — ACETAMINOPHEN 325 MG PO TABS: 325.0000 mg | ORAL_TABLET | ORAL | Status: DC | PRN

## 2017-07-02 MED ORDER — SUGAMMADEX SODIUM 200 MG/2ML IV SOLN
INTRAVENOUS | Status: AC
  Filled 2017-07-02: qty 2

## 2017-07-02 MED ORDER — LIDOCAINE HCL (CARDIAC) PF 100 MG/5ML IV SOSY
PREFILLED_SYRINGE | INTRAVENOUS | Status: DC | PRN
  Administered 2017-07-02: 80 mg via INTRAVENOUS

## 2017-07-02 MED ORDER — FENTANYL CITRATE (PF) 100 MCG/2ML IJ SOLN
INTRAMUSCULAR | Status: DC | PRN
  Administered 2017-07-02: 100 ug via INTRAVENOUS

## 2017-07-02 MED ORDER — MIDAZOLAM HCL 2 MG/2ML IJ SOLN
INTRAMUSCULAR | Status: AC
  Filled 2017-07-02: qty 2

## 2017-07-02 MED ORDER — FENTANYL CITRATE (PF) 100 MCG/2ML IJ SOLN
INTRAMUSCULAR | Status: AC
  Filled 2017-07-02: qty 2

## 2017-07-02 MED ORDER — PROPOFOL 10 MG/ML IV BOLUS
INTRAVENOUS | Status: DC | PRN
  Administered 2017-07-02: 200 mg via INTRAVENOUS

## 2017-07-02 MED ORDER — OXYCODONE HCL 5 MG/5ML PO SOLN
5.0000 mg | Freq: Once | ORAL | Status: DC | PRN
  Filled 2017-07-02: qty 5

## 2017-07-02 MED ORDER — SODIUM CHLORIDE 0.9 % IR SOLN
Status: DC | PRN
  Administered 2017-07-02: 6000 mL

## 2017-07-02 MED ORDER — FENTANYL CITRATE (PF) 100 MCG/2ML IJ SOLN
25.0000 ug | INTRAMUSCULAR | Status: DC | PRN
  Administered 2017-07-02 (×2): 50 ug via INTRAVENOUS

## 2017-07-02 MED ORDER — MITOMYCIN CHEMO FOR BLADDER INSTILLATION 40 MG
40.0000 mg | Freq: Once | INTRAVENOUS | Status: AC
  Administered 2017-07-02: 40 mg via INTRAVESICAL
  Filled 2017-07-02: qty 40

## 2017-07-02 MED ORDER — OXYCODONE HCL 5 MG PO TABS: 5.0000 mg | ORAL_TABLET | Freq: Once | ORAL | Status: DC | PRN

## 2017-07-02 MED ORDER — MIDAZOLAM HCL 5 MG/5ML IJ SOLN
INTRAMUSCULAR | Status: DC | PRN
  Administered 2017-07-02: 2 mg via INTRAVENOUS

## 2017-07-02 MED ORDER — PROPOFOL 10 MG/ML IV BOLUS
INTRAVENOUS | Status: AC
  Filled 2017-07-02: qty 20

## 2017-07-02 MED ORDER — IOHEXOL 300 MG/ML  SOLN
INTRAMUSCULAR | Status: DC | PRN
  Administered 2017-07-02: 20 mL

## 2017-07-02 MED ORDER — ONDANSETRON HCL 4 MG/2ML IJ SOLN: 4.0000 mg | Freq: Once | INTRAMUSCULAR | Status: DC | PRN

## 2017-07-02 MED ORDER — FENTANYL CITRATE (PF) 100 MCG/2ML IJ SOLN
INTRAMUSCULAR | Status: AC
  Administered 2017-07-02: 50 ug via INTRAVENOUS
  Filled 2017-07-02: qty 2

## 2017-07-02 MED ORDER — MEPERIDINE HCL 50 MG/ML IJ SOLN: 6.2500 mg | INTRAMUSCULAR | Status: DC | PRN

## 2017-07-02 MED ORDER — LACTATED RINGERS IV SOLN
INTRAVENOUS | Status: DC
  Administered 2017-07-02: 14:00:00 via INTRAVENOUS

## 2017-07-02 MED ORDER — PHENAZOPYRIDINE HCL 200 MG PO TABS: 200.0000 mg | ORAL_TABLET | Freq: Three times a day (TID) | ORAL | 0 refills | Status: DC | PRN

## 2017-07-02 MED ORDER — ACETAMINOPHEN 160 MG/5ML PO SOLN: 325.0000 mg | ORAL | Status: DC | PRN

## 2017-07-02 MED ORDER — ONDANSETRON HCL 4 MG/2ML IJ SOLN
INTRAMUSCULAR | Status: AC
  Filled 2017-07-02: qty 2

## 2017-07-02 MED ORDER — CEFAZOLIN SODIUM-DEXTROSE 2-4 GM/100ML-% IV SOLN
2.0000 g | Freq: Once | INTRAVENOUS | Status: AC
  Administered 2017-07-02: 2 g via INTRAVENOUS
  Filled 2017-07-02: qty 100

## 2017-07-02 MED ORDER — SUGAMMADEX SODIUM 200 MG/2ML IV SOLN
INTRAVENOUS | Status: DC | PRN
  Administered 2017-07-02: 200 mg via INTRAVENOUS

## 2017-07-02 MED ORDER — ROCURONIUM BROMIDE 100 MG/10ML IV SOLN
INTRAVENOUS | Status: DC | PRN
  Administered 2017-07-02: 10 mg via INTRAVENOUS
  Administered 2017-07-02: 40 mg via INTRAVENOUS
  Administered 2017-07-02: 10 mg via INTRAVENOUS

## 2017-07-02 SURGICAL SUPPLY — 21 items
BAG URINE DRAINAGE (UROLOGICAL SUPPLIES) IMPLANT
BAG URO CATCHER STRL LF (MISCELLANEOUS) ×3 IMPLANT
CATH FOLEY 2WAY SLVR  5CC 16FR (CATHETERS) ×2
CATH FOLEY 2WAY SLVR 5CC 16FR (CATHETERS) ×1 IMPLANT
CATH INTERMIT  6FR 70CM (CATHETERS) ×3 IMPLANT
CLOTH BEACON ORANGE TIMEOUT ST (SAFETY) IMPLANT
COVER FOOTSWITCH UNIV (MISCELLANEOUS) IMPLANT
COVER SURGICAL LIGHT HANDLE (MISCELLANEOUS) IMPLANT
ELECT REM PT RETURN 15FT ADLT (MISCELLANEOUS) ×3 IMPLANT
GLOVE BIOGEL M STRL SZ7.5 (GLOVE) ×3 IMPLANT
GOWN STRL REUS W/TWL LRG LVL3 (GOWN DISPOSABLE) ×6 IMPLANT
GUIDEWIRE STR DUAL SENSOR (WIRE) ×3 IMPLANT
LOOP CUT BIPOLAR 24F LRG (ELECTROSURGICAL) ×3 IMPLANT
MANIFOLD NEPTUNE II (INSTRUMENTS) ×3 IMPLANT
PACK CYSTO (CUSTOM PROCEDURE TRAY) ×3 IMPLANT
PLUG CATH AND CAP STER (CATHETERS) ×3 IMPLANT
SET ASPIRATION TUBING (TUBING) IMPLANT
SYRINGE IRR TOOMEY STRL 70CC (SYRINGE) ×3 IMPLANT
TUBING CONNECTING 10 (TUBING) ×2 IMPLANT
TUBING CONNECTING 10' (TUBING) ×1
TUBING UROLOGY SET (TUBING) ×3 IMPLANT

## 2017-07-02 NOTE — Op Note (Addendum)
Preoperative diagnosis: Bladder tumor, bladder neck polyp  Postoperative diagnosis: Bladder tumor, bladder neck polyp  Procedures: 1.  Cystoscopy 2.  Pelvic exam under anesthesia 3.  Bilateral retrograde pyelography with interpretation 4.  Transurethral resection of bladder tumor (3.5 cm) 5.  Transurethral resection of bladder neck polyp  Surgeon: Pryor Curia MD  Anesthesia: General  Complications: None  EBL: Minimal  Intraoperative findings: Bilateral retrograde pyelography was performed with a 6 French ureteral catheter and Omnipaque contrast.  There were no filling defects within the renal collecting systems or ureters bilaterally.  Specimens: 1.  Right lateral bladder tumor 2.  Base of right lateral bladder tumor 3.  Bladder neck polyp  Disposition of specimens: Pathology  Indication: Rodney [name] is a 47 year old gentleman who recently presented with hematuria.  He was found to have a 3.5 cm right lateral bladder tumor.  He presents today for further evaluation and to undergo transurethral resection of his bladder tumor.  We reviewed the potential risks, complications, and the expected recovery process associated with the above procedures.  He gives informed consent to proceed.  Description of procedure: The patient was taken the operating room and a general anesthetic was administered.  He was given preoperative antibiotics, placed in the dorsolithotomy position, and prepped and draped in usual sterile fashion.  A preoperative timeout was performed.  Paralysis was administered.  Cystourethroscopy was performed which revealed a normal anterior urethra.  At the bladder neck, at approximately the 8 to 9 o'clock position, a protruding bladder neck polyp was noted.  This was smooth and did not appear to be consistent with malignancy.  On entering the bladder, the bladder was carefully examined both a 30 degree and 70 degree lens.  This revealed a large 3.5 cm papillary tumor  overlying the right lateral bladder wall.  No other bladder tumors or other mucosal pathology was identified.  The ureteral orifice ease were noted in their expected anatomic location.  Retrograde pyelography was then performed with a 6 French ureteral catheter and Omnipaque contrast as described above.  No abnormalities were noted in the upper urinary tract.  Attention then returned to the bladder and the cystoscope was removed and replaced with the 26 French resectoscope sheath.  Once the patient was confirmed to be paralyzed, bipolar transurethral resection of the right lateral bladder tumor commenced.  The tumor was systematically resected down to its base.  The specimens were all then passed off and labeled as right lateral bladder tumor.  Further resection was then performed at the base of the tumor and sent as a separate specimen labeled base of the right lateral bladder tumor.  Hemostasis was achieved with bipolar cautery.  There did not appear to be evidence of a bladder perforation.  The bladder was emptied and reinspected and hemostasis remained excellent.  The bladder neck was then reinspected in the previously noted polyp that appeared to be partially obstructing was resected with bipolar cutting resection.  This area was then evaluated and hemostasis was achieved with electrocautery.  A 16 French Foley catheter was inserted into the bladder in preparation for postoperative intravesical chemotherapy.  The pelvic exam under anesthesia was performed.  No prostatic nodules or pelvic masses were noted.  Patient was able to be awakened and transferred to recovery in satisfactory condition.  He tolerated the procedure well without complications.  In the PACU, 40 mg of mitomycin and 40 cc of sterile water was instilled into his catheter and the catheter was plugged for  1 hour.  He was monitored and the catheter was eventually placed to drainage and was then removed.  He was given a voiding trial before  being discharged home.

## 2017-07-02 NOTE — Discharge Instructions (Signed)
1. You may see some blood in the urine and may have some burning with urination for 48-72 hours. You also may notice that you have to urinate more frequently or urgently after your procedure which is normal.  °2. You should call should you develop an inability urinate, fever > 101, persistent nausea and vomiting that prevents you from eating or drinking to stay hydrated.  °

## 2017-07-02 NOTE — Interval H&P Note (Signed)
History and Physical Interval Note:  07/02/2017 2:52 PM  Rodney [name]  has presented today for surgery, with the diagnosis of BLADDER TUMOR  The various methods of treatment have been discussed with the patient and family. After consideration of risks, benefits and other options for treatment, the patient has consented to  Procedure(s): CYSTOSCOPY WITH RETROGRADE PYELOGRAM/EXAM UNDER ANESTHESIA (N/A) TRANSURETHRAL RESECTION OF BLADDER TUMOR (TURBT)/ POSSIBLE INTRAVESICAL INSTILLATION OF MITOMYCIN C (N/A) as a surgical intervention .  The patient's history has been reviewed, patient examined, no change in status, stable for surgery.  I have reviewed the patient's chart and labs.  Questions were answered to the patient's satisfaction.     Willie Loy,LES

## 2017-07-02 NOTE — Anesthesia Preprocedure Evaluation (Addendum)
Anesthesia Evaluation  Patient identified by MRN, date of birth, ID band Patient awake    Reviewed: Allergy & Precautions, H&P , NPO status , Patient's Chart, lab work & pertinent test results, reviewed documented beta blocker date and time   Airway Mallampati: II  TM Distance: >3 FB Neck ROM: full    Dental no notable dental hx. (+) Teeth Intact, Poor Dentition, Chipped   Pulmonary neg pulmonary ROS, former smoker,    Pulmonary exam normal breath sounds clear to auscultation       Cardiovascular Exercise Tolerance: Good negative cardio ROS   Rhythm:regular Rate:Normal     Neuro/Psych negative neurological ROS  negative psych ROS   GI/Hepatic negative GI ROS, Neg liver ROS,   Endo/Other  negative endocrine ROS  Renal/GU negative Renal ROS  negative genitourinary   Musculoskeletal   Abdominal   Peds  Hematology negative hematology ROS (+)   Anesthesia Other Findings   Reproductive/Obstetrics negative OB ROS                            Anesthesia Physical Anesthesia Plan  ASA: II  Anesthesia Plan: General   Post-op Pain Management:    Induction: Intravenous  PONV Risk Score and Plan: 1 and Ondansetron, Treatment may vary due to age or medical condition and Dexamethasone  Airway Management Planned: Oral ETT  Additional Equipment:   Intra-op Plan:   Post-operative Plan: Extubation in OR  Informed Consent: I have reviewed the patients History and Physical, chart, labs and discussed the procedure including the risks, benefits and alternatives for the proposed anesthesia with the patient or authorized representative who has indicated his/her understanding and acceptance.   Dental Advisory Given  Plan Discussed with: CRNA, Anesthesiologist and Surgeon  Anesthesia Plan Comments: ( )       Anesthesia Quick Evaluation

## 2017-07-02 NOTE — Anesthesia Procedure Notes (Signed)
Procedure Name: Intubation Date/Time: 07/02/2017 3:24 PM Performed by: Glory Buff, CRNA Pre-anesthesia Checklist: Patient identified, Emergency Drugs available, Suction available and Patient being monitored Patient Re-evaluated:Patient Re-evaluated prior to induction Oxygen Delivery Method: Circle system utilized Preoxygenation: Pre-oxygenation with 100% oxygen Induction Type: IV induction Ventilation: Mask ventilation without difficulty Laryngoscope Size: Miller and 3 Grade View: Grade I Tube type: Oral Tube size: 7.5 mm Number of attempts: 1 Airway Equipment and Method: Stylet and Oral airway Placement Confirmation: ETT inserted through vocal cords under direct vision,  positive ETCO2 and breath sounds checked- equal and bilateral Secured at: 25 cm Tube secured with: Tape Dental Injury: Teeth and Oropharynx as per pre-operative assessment

## 2017-07-02 NOTE — Anesthesia Postprocedure Evaluation (Signed)
Anesthesia Post Note  Patient: Rodney [name]  Procedure(s) Performed: CYSTOSCOPY WITH RETROGRADE PYELOGRAM/EXAM UNDER ANESTHESIA (N/A ) TRANSURETHRAL RESECTION OF BLADDER TUMOR (TURBT) (N/A )     Patient location during evaluation: PACU Anesthesia Type: General Level of consciousness: awake and alert Pain management: pain level controlled Vital Signs Assessment: post-procedure vital signs reviewed and stable Respiratory status: spontaneous breathing, nonlabored ventilation, respiratory function stable and patient connected to nasal cannula oxygen Cardiovascular status: blood pressure returned to baseline and stable Postop Assessment: no apparent nausea or vomiting Anesthetic complications: no    Last Vitals:  Vitals:   07/02/17 1406 07/02/17 1432  BP: (!) 140/97 127/83  Pulse: 88   Resp: 16   Temp: 36.9 C   SpO2: 98%     Last Pain:  Vitals:   07/02/17 1421  TempSrc:   PainSc: 6                  Xin Klawitter

## 2017-07-02 NOTE — Transfer of Care (Signed)
Immediate Anesthesia Transfer of Care Note  Patient: Rodney [name]  Procedure(s) Performed: CYSTOSCOPY WITH RETROGRADE PYELOGRAM/EXAM UNDER ANESTHESIA (N/A ) TRANSURETHRAL RESECTION OF BLADDER TUMOR (TURBT) (N/A )  Patient Location: PACU  Anesthesia Type:General  Level of Consciousness: awake, alert  and oriented  Airway & Oxygen Therapy: Patient Spontanous Breathing and Patient connected to face mask oxygen  Post-op Assessment: Report given to RN and Post -op Vital signs reviewed and stable  Post vital signs: Reviewed and stable  Last Vitals:  Vitals Value Taken Time  BP 136/94 07/02/2017  4:15 PM  Temp    Pulse 72 07/02/2017  4:16 PM  Resp 16 07/02/2017  4:16 PM  SpO2 100 % 07/02/2017  4:16 PM  Vitals shown include unvalidated device data.  Last Pain:  Vitals:   07/02/17 1421  TempSrc:   PainSc: 6       Patients Stated Pain Goal: 3 (03/30/92 5859)  Complications: No apparent anesthesia complications

## 2017-07-03 ENCOUNTER — Encounter (HOSPITAL_COMMUNITY): Payer: Self-pay | Admitting: Urology

## 2017-07-25 ENCOUNTER — Other Ambulatory Visit: Payer: Self-pay | Admitting: Urology

## 2017-07-25 ENCOUNTER — Other Ambulatory Visit (HOSPITAL_COMMUNITY): Payer: Self-pay | Admitting: Urology

## 2017-07-25 DIAGNOSIS — R972 Elevated prostate specific antigen [PSA]: Secondary | ICD-10-CM

## 2017-08-02 NOTE — Patient Instructions (Signed)
Rodney [name]  08/02/2017   Your procedure is scheduled on: 08-09-17   Report to [facility] Main  Entrance    Report to admitting at 5:30AM    Call this number if you have problems the morning of surgery [phone]     Remember: Do not eat food or drink liquids :After Midnight.     Take these medicines the morning of surgery with A SIP OF WATER: OXYCODONE IF NEEDED                                 You may not have any metal on your body including hair pins and              piercings  Do not wear jewelry, make-up, lotions, powders or perfumes, deodorant                      Men may shave face and neck.   Do not bring valuables to the hospital. [place].  Contacts, dentures or bridgework may not be worn into surgery.     Patients discharged the day of surgery will not be allowed to drive home.  Name and phone number of your driver:  Special Instructions: N/A              Please read over the following fact sheets you were given: _____________________________________________________________________             [facility] - Preparing for Surgery Before surgery, you can play an important role.  Because skin is not sterile, your skin needs to be as free of germs as possible.  You can reduce the number of germs on your skin by washing with CHG (chlorahexidine gluconate) soap before surgery.  CHG is an antiseptic cleaner which kills germs and bonds with the skin to continue killing germs even after washing. Please DO NOT use if you have an allergy to CHG or antibacterial soaps.  If your skin becomes reddened/irritated stop using the CHG and inform your nurse when you arrive at Short Stay. Do not shave (including legs and underarms) for at least 48 hours prior to the first CHG shower.  You may shave your face/neck. Please follow these instructions carefully:  1.  Shower with CHG Soap the night before  surgery and the  morning of Surgery.  2.  If you choose to wash your hair, wash your hair first as usual with your  normal  shampoo.  3.  After you shampoo, rinse your hair and body thoroughly to remove the  shampoo.                           4.  Use CHG as you would any other liquid soap.  You can apply chg directly  to the skin and wash                       Gently with a scrungie or clean washcloth.  5.  Apply the CHG Soap to your body ONLY FROM THE NECK DOWN.   Do not use on face/ open  Wound or open sores. Avoid contact with eyes, ears mouth and genitals (private parts).                       Wash face,  Genitals (private parts) with your normal soap.             6.  Wash thoroughly, paying special attention to the area where your surgery  will be performed.  7.  Thoroughly rinse your body with warm water from the neck down.  8.  DO NOT shower/wash with your normal soap after using and rinsing off  the CHG Soap.                9.  Pat yourself dry with a clean towel.            10.  Wear clean pajamas.            11.  Place clean sheets on your bed the night of your first shower and do not  sleep with pets. Day of Surgery : Do not apply any lotions/deodorants the morning of surgery.  Please wear clean clothes to the hospital/surgery center.  FAILURE TO FOLLOW THESE INSTRUCTIONS MAY RESULT IN THE CANCELLATION OF YOUR SURGERY PATIENT SIGNATURE_________________________________  NURSE SIGNATURE__________________________________  ________________________________________________________________________

## 2017-08-03 ENCOUNTER — Encounter (INDEPENDENT_AMBULATORY_CARE_PROVIDER_SITE_OTHER): Payer: Self-pay

## 2017-08-03 ENCOUNTER — Encounter (HOSPITAL_COMMUNITY): Payer: Self-pay

## 2017-08-03 ENCOUNTER — Encounter (HOSPITAL_COMMUNITY)
Admission: RE | Admit: 2017-08-03 | Discharge: 2017-08-03 | Disposition: A | Discharge status: Home / Self Care | Payer: PRIVATE HEALTH INSURANCE | Source: Ambulatory Visit | Attending: Urology | Admitting: Urology

## 2017-08-03 ENCOUNTER — Other Ambulatory Visit: Payer: Self-pay

## 2017-08-03 DIAGNOSIS — Z01812 Encounter for preprocedural laboratory examination: Secondary | ICD-10-CM | POA: Diagnosis present

## 2017-08-03 DIAGNOSIS — C679 Malignant neoplasm of bladder, unspecified: Secondary | ICD-10-CM | POA: Diagnosis not present

## 2017-08-03 LAB — CBC
HCT: 39.9 % (ref 39.0–52.0)
Hemoglobin: 13.3 g/dL (ref 13.0–17.0)
MCH: 32.4 pg (ref 26.0–34.0)
MCHC: 33.3 g/dL (ref 30.0–36.0)
MCV: 97.3 fL (ref 78.0–100.0)
Platelets: 260 10*3/uL (ref 150–400)
RBC: 4.1 MIL/uL — ABNORMAL LOW (ref 4.22–5.81)
RDW: 12.7 % (ref 11.5–15.5)
WBC: 7.3 10*3/uL (ref 4.0–10.5)

## 2017-08-03 LAB — BASIC METABOLIC PANEL
ANION GAP: 6 (ref 5–15)
BUN: 15 mg/dL (ref 6–20)
CALCIUM: 9.2 mg/dL (ref 8.9–10.3)
CO2: 27 mmol/L (ref 22–32)
Chloride: 108 mmol/L (ref 98–111)
Creatinine, Ser: 1.05 mg/dL (ref 0.61–1.24)
GFR calc Af Amer: >60 mL/min (ref >60)
GLUCOSE: 105 mg/dL — AB (ref 70–99)
Potassium: 4.3 mmol/L (ref 3.5–5.1)
Sodium: 141 mmol/L (ref 135–145)

## 2017-08-08 NOTE — Anesthesia Preprocedure Evaluation (Addendum)
Anesthesia Evaluation  Patient identified by MRN, date of birth, ID band Patient awake    Reviewed: Allergy & Precautions, NPO status , Patient's Chart, lab work & pertinent test results  History of Anesthesia Complications Negative for: history of anesthetic complications  Airway Mallampati: I  TM Distance: >3 FB Neck ROM: Full    Dental  (+) Poor Dentition, Missing, Dental Advisory Given   Pulmonary COPD, former smoker (quit 2018),    breath sounds clear to auscultation       Cardiovascular negative cardio ROS   Rhythm:Regular Rate:Normal     Neuro/Psych Anxiety Depression negative neurological ROS     GI/Hepatic negative GI ROS, Neg liver ROS,   Endo/Other  negative endocrine ROS  Renal/GU negative Renal ROS   Bladder tumor, elevated PSA    Musculoskeletal   Abdominal   Peds  Hematology negative hematology ROS (+)   Anesthesia Other Findings   Reproductive/Obstetrics                            Anesthesia Physical Anesthesia Plan  ASA: II  Anesthesia Plan: General   Post-op Pain Management:    Induction: Intravenous  PONV Risk Score and Plan: 2 and Ondansetron and Dexamethasone  Airway Management Planned: Oral ETT  Additional Equipment:   Intra-op Plan:   Post-operative Plan: Extubation in OR  Informed Consent: I have reviewed the patients History and Physical, chart, labs and discussed the procedure including the risks, benefits and alternatives for the proposed anesthesia with the patient or authorized representative who has indicated his/her understanding and acceptance.   Dental advisory given  Plan Discussed with: CRNA and Surgeon  Anesthesia Plan Comments: (Plan routine monitors, GETA)        Anesthesia Quick Evaluation

## 2017-08-08 NOTE — H&P (Signed)
Office Visit Report     07/24/2017   --------------------------------------------------------------------------------   Rodney [name]  MRN: [id number]  PRIMARY CARE:  [facility]  DOB: [birth year], 47 year old Male  REFERRING:  [facility]  SSN:   PROVIDER:  Raynelle Bring, M.D.    LOCATION:  [facility] [phone]   --------------------------------------------------------------------------------   CC/HPI: 1. Bladder cancer  2. Elevated PSA   Rodney [name] is a 47 year old gentleman who presents today a few weeks out from his recent transurethral resection of his bladder tumor. He has recovered uneventfully and currently denies any hematuria or voiding complaints. His pathology indicated high-grade, T1 urothelial carcinoma. Muscularis propria was present in the specimen but not involved.   In addition, he has a brother who was diagnosed with prostate cancer 34 in recently underwent surgical therapy. For this reason, Rodney [name] did undergo PSA screening. His initial PSA was noted to be elevated at 5.58 with a low free percentage of 80%.     ALLERGIES: Hydrocodone-Acetaminophen Tramadol    MEDICATIONS: Percocet 7.5 mg-325 mg tablet     GU PSH: Bladder Instill AntiCA Agent - 07/02/2017 Cystoscopy - 06/21/2017 Cystoscopy TURBT <2 cm - 07/02/2017 Cystoscopy TURBT 2-5 cm - 07/02/2017    NON-GU PSH: Back Surgery (Unspecified) Leg surgery (unspecified)    GU PMH: Bladder tumor/neoplasm - 06/21/2017 Family Hx of Prostate Cancer - 06/21/2017    NON-GU PMH: None   FAMILY HISTORY: 2 daughters - Runs in Family Aneurysm - Mother Heart Attack - Father   SOCIAL HISTORY: Marital Status: Single Preferred Language: English; Ethnicity: Not Hispanic Or Latino; Race: White Current Smoking Status: Patient does not smoke anymore. Has not smoked since 06/16/2016. Smoked for 15 years. Smoked 2 packs per day.   Tobacco Use Assessment Completed: Used  Tobacco in last 30 days? Does drink.  Drinks 4+ caffeinated drinks per day.    REVIEW OF SYSTEMS:    GU Review Male:   Patient denies frequent urination, hard to postpone urination, burning/ pain with urination, get up at night to urinate, leakage of urine, stream starts and stops, trouble starting your streams, and have to strain to urinate .  Gastrointestinal (Upper):   Patient denies nausea and vomiting.  Gastrointestinal (Lower):   Patient denies diarrhea and constipation.  Constitutional:   Patient denies fever, night sweats, weight loss, and fatigue.  Skin:   Patient denies skin rash/ lesion and itching.  Eyes:   Patient denies blurred vision and double vision.  Ears/ Nose/ Throat:   Patient denies sore throat and sinus problems.  Hematologic/Lymphatic:   Patient denies swollen glands and easy bruising.  Cardiovascular:   Patient denies leg swelling and chest pains.  Respiratory:   Patient denies cough and shortness of breath.  Endocrine:   Patient denies excessive thirst.  Musculoskeletal:   Patient denies back pain and joint pain.  Neurological:   Patient denies headaches and dizziness.  Psychologic:   Patient denies depression and anxiety.   VITAL SIGNS:      07/24/2017 10:14 AM  Weight 192 lb / 87.09 kg  Height 74 in / 187.96 cm  BP 133/85 mmHg  Pulse 67 /min  BMI 24.6 kg/m   MULTI-SYSTEM PHYSICAL EXAMINATION:    Constitutional: Well-nourished. No physical deformities. Normally developed. Good grooming.     PAST DATA REVIEWED:  Source Of History:  Patient  Lab Test Review:   PSA  Records Review:   Pathology Reports  Urine  Test Review:   Urinalysis   06/21/17  PSA  Total PSA 5.58 ng/mL  Free PSA 0.45 ng/mL  % Free PSA 8 % PSA    PROCEDURES:          Urinalysis w/Scope Dipstick Dipstick Cont'd Micro  Color: Yellow Bilirubin: Neg WBC/hpf: 0 - 5/hpf  Appearance: Clear Ketones: Neg RBC/hpf: 10 - 20/hpf  Specific Gravity: 1.015 Blood: 2+ Bacteria: NS (Not Seen)   pH: 7.0 Protein: Neg Cystals: NS (Not Seen)  Glucose: Neg Urobilinogen: 0.2 Casts: Hyaline    Nitrites: Neg Trichomonas: Not Present    Leukocyte Esterase: Neg Mucous: Not Present      Epithelial Cells: NS (Not Seen)      Yeast: NS (Not Seen)      Sperm: Not Present    ASSESSMENT:      ICD-10 Details  1 GU:   Bladder Cancer Lateral - C67.2   2   Elevated PSA - R97.20    PLAN:           Schedule Return Visit/Planned Activity: Other See Visit Notes             Note: Will call to schedule surgery          Document Letter(s):  Created for Patient: Clinical Summary         Notes:   1. High-grade, T1 urothelial carcinoma the bladder: I discussed his pathology findings in detail today. We did discuss options including proceeding with radical cystectomy verses intravesical BCG. He does wish to proceed with the latter approach. I therefore recommended that he be scheduled for a repeat transurethral resection in the next couple of weeks for further clinical staging to confirm his diagnosis prior to proceeding with treatment. We did discuss intravesical BCG therapy today including the potential risks and benefits for him. He will be scheduled in the near future for cystoscopy and repeat transurethral resection. We reviewed the potential risks, complications, and expected recovery process with this procedure.   2. Elevated PSA/strong family history of prostate cancer: We discussed his elevated PSA in more detail today. I have recommended that he proceed with a prostate needle biopsy under transrectal ultrasound guidance. Considering his need for general anesthesia in the near future, we discussed performing this at the same time as his upcoming repeat TUR. We have reviewed this procedure in detail including the potential risks and complications. He gives informed consent to proceed. He will then follow up shortly after this procedure for a cancer consultation to discuss the results of his prostate  biopsy and repeat TUR.   Cc: Dr. Judd Lien     E & M CODE: I spent at least 32 minutes face to face with the patient, more than 50% of that time was spent on counseling and/or coordinating care.     * Signed by Raynelle Bring, M.D. on 07/24/17 at 1:14 PM (EDT)*

## 2017-08-09 ENCOUNTER — Ambulatory Visit (HOSPITAL_COMMUNITY): Payer: PRIVATE HEALTH INSURANCE | Admitting: Anesthesiology

## 2017-08-09 ENCOUNTER — Ambulatory Visit (HOSPITAL_COMMUNITY)
Admission: RE | Admit: 2017-08-09 | Discharge: 2017-08-09 | Disposition: A | Discharge status: Home / Self Care | Payer: PRIVATE HEALTH INSURANCE | Source: Ambulatory Visit | Attending: Urology | Admitting: Urology

## 2017-08-09 ENCOUNTER — Encounter (HOSPITAL_COMMUNITY)
Admission: RE | Disposition: A | Discharge status: Home / Self Care | Payer: Self-pay | Source: Ambulatory Visit | Attending: Urology

## 2017-08-09 ENCOUNTER — Encounter (HOSPITAL_COMMUNITY): Payer: Self-pay

## 2017-08-09 DIAGNOSIS — Z8249 Family history of ischemic heart disease and other diseases of the circulatory system: Secondary | ICD-10-CM | POA: Insufficient documentation

## 2017-08-09 DIAGNOSIS — Z79899 Other long term (current) drug therapy: Secondary | ICD-10-CM | POA: Insufficient documentation

## 2017-08-09 DIAGNOSIS — C61 Malignant neoplasm of prostate: Secondary | ICD-10-CM | POA: Insufficient documentation

## 2017-08-09 DIAGNOSIS — D090 Carcinoma in situ of bladder: Secondary | ICD-10-CM | POA: Diagnosis not present

## 2017-08-09 DIAGNOSIS — N3020 Other chronic cystitis without hematuria: Secondary | ICD-10-CM | POA: Diagnosis not present

## 2017-08-09 DIAGNOSIS — R972 Elevated prostate specific antigen [PSA]: Secondary | ICD-10-CM | POA: Diagnosis present

## 2017-08-09 DIAGNOSIS — F329 Major depressive disorder, single episode, unspecified: Secondary | ICD-10-CM | POA: Diagnosis not present

## 2017-08-09 DIAGNOSIS — Z87891 Personal history of nicotine dependence: Secondary | ICD-10-CM | POA: Insufficient documentation

## 2017-08-09 DIAGNOSIS — J449 Chronic obstructive pulmonary disease, unspecified: Secondary | ICD-10-CM | POA: Diagnosis not present

## 2017-08-09 DIAGNOSIS — F419 Anxiety disorder, unspecified: Secondary | ICD-10-CM | POA: Diagnosis not present

## 2017-08-09 DIAGNOSIS — Z8042 Family history of malignant neoplasm of prostate: Secondary | ICD-10-CM | POA: Insufficient documentation

## 2017-08-09 DIAGNOSIS — Z885 Allergy status to narcotic agent status: Secondary | ICD-10-CM | POA: Insufficient documentation

## 2017-08-09 DIAGNOSIS — C672 Malignant neoplasm of lateral wall of bladder: Secondary | ICD-10-CM | POA: Diagnosis present

## 2017-08-09 HISTORY — PX: TRANSRECTAL ULTRASOUND: SHX5146

## 2017-08-09 HISTORY — PX: TRANSURETHRAL RESECTION OF BLADDER TUMOR: SHX2575

## 2017-08-09 HISTORY — PX: CYSTOSCOPY WITH BIOPSY: SHX5122

## 2017-08-09 SURGERY — CYSTOSCOPY, WITH BIOPSY
Anesthesia: General | Site: Rectum

## 2017-08-09 MED ORDER — SUGAMMADEX SODIUM 200 MG/2ML IV SOLN
INTRAVENOUS | Status: DC | PRN
  Administered 2017-08-09: 200 mg via INTRAVENOUS

## 2017-08-09 MED ORDER — ROCURONIUM BROMIDE 10 MG/ML (PF) SYRINGE
PREFILLED_SYRINGE | INTRAVENOUS | Status: AC
  Filled 2017-08-09: qty 10

## 2017-08-09 MED ORDER — FENTANYL CITRATE (PF) 100 MCG/2ML IJ SOLN: 25.0000 ug | INTRAMUSCULAR | Status: DC | PRN

## 2017-08-09 MED ORDER — FENTANYL CITRATE (PF) 100 MCG/2ML IJ SOLN
25.0000 ug | INTRAMUSCULAR | Status: AC | PRN
  Administered 2017-08-09 (×2): 50 ug via INTRAVENOUS
  Administered 2017-08-09 (×4): 25 ug via INTRAVENOUS

## 2017-08-09 MED ORDER — PROPOFOL 10 MG/ML IV BOLUS
INTRAVENOUS | Status: DC | PRN
  Administered 2017-08-09: 200 mg via INTRAVENOUS

## 2017-08-09 MED ORDER — PHENAZOPYRIDINE HCL 200 MG PO TABS: 200.0000 mg | ORAL_TABLET | Freq: Three times a day (TID) | ORAL | 0 refills | Status: DC | PRN

## 2017-08-09 MED ORDER — FENTANYL CITRATE (PF) 100 MCG/2ML IJ SOLN
INTRAMUSCULAR | Status: AC
  Filled 2017-08-09: qty 2

## 2017-08-09 MED ORDER — ROCURONIUM BROMIDE 10 MG/ML (PF) SYRINGE
PREFILLED_SYRINGE | INTRAVENOUS | Status: DC | PRN
  Administered 2017-08-09: 50 mg via INTRAVENOUS

## 2017-08-09 MED ORDER — ONDANSETRON HCL 4 MG/2ML IJ SOLN
INTRAMUSCULAR | Status: DC | PRN
  Administered 2017-08-09: 4 mg via INTRAVENOUS

## 2017-08-09 MED ORDER — PROMETHAZINE HCL 25 MG/ML IJ SOLN: 6.2500 mg | INTRAMUSCULAR | Status: DC | PRN

## 2017-08-09 MED ORDER — SODIUM CHLORIDE 0.9 % IR SOLN
Status: DC | PRN
  Administered 2017-08-09: 9000 mL

## 2017-08-09 MED ORDER — LIDOCAINE 2% (20 MG/ML) 5 ML SYRINGE
INTRAMUSCULAR | Status: DC | PRN
  Administered 2017-08-09: 100 mg via INTRAVENOUS

## 2017-08-09 MED ORDER — MEPERIDINE HCL 50 MG/ML IJ SOLN: 6.2500 mg | INTRAMUSCULAR | Status: DC | PRN

## 2017-08-09 MED ORDER — MIDAZOLAM HCL 5 MG/5ML IJ SOLN
INTRAMUSCULAR | Status: DC | PRN
  Administered 2017-08-09: 2 mg via INTRAVENOUS

## 2017-08-09 MED ORDER — PHENAZOPYRIDINE HCL 200 MG PO TABS
ORAL_TABLET | ORAL | Status: AC
  Filled 2017-08-09: qty 1

## 2017-08-09 MED ORDER — MIDAZOLAM HCL 2 MG/2ML IJ SOLN
INTRAMUSCULAR | Status: AC
  Filled 2017-08-09: qty 2

## 2017-08-09 MED ORDER — PROPOFOL 10 MG/ML IV BOLUS
INTRAVENOUS | Status: AC
  Filled 2017-08-09: qty 20

## 2017-08-09 MED ORDER — LIDOCAINE 2% (20 MG/ML) 5 ML SYRINGE
INTRAMUSCULAR | Status: AC
  Filled 2017-08-09: qty 5

## 2017-08-09 MED ORDER — LACTATED RINGERS IV SOLN
INTRAVENOUS | Status: DC
  Administered 2017-08-09: 06:00:00 via INTRAVENOUS

## 2017-08-09 MED ORDER — SODIUM CHLORIDE 0.9 % IV SOLN
2.0000 g | Freq: Once | INTRAVENOUS | Status: AC
  Administered 2017-08-09: 2 g via INTRAVENOUS
  Filled 2017-08-09: qty 20

## 2017-08-09 MED ORDER — MIDAZOLAM HCL 2 MG/2ML IJ SOLN: 0.5000 mg | Freq: Once | INTRAMUSCULAR | Status: DC | PRN

## 2017-08-09 MED ORDER — PHENAZOPYRIDINE HCL 200 MG PO TABS
200.0000 mg | ORAL_TABLET | Freq: Three times a day (TID) | ORAL | Status: DC
  Administered 2017-08-09: 200 mg via ORAL

## 2017-08-09 MED ORDER — FENTANYL CITRATE (PF) 100 MCG/2ML IJ SOLN
INTRAMUSCULAR | Status: DC | PRN
  Administered 2017-08-09: 100 ug via INTRAVENOUS

## 2017-08-09 MED ORDER — SUGAMMADEX SODIUM 200 MG/2ML IV SOLN
INTRAVENOUS | Status: AC
  Filled 2017-08-09: qty 2

## 2017-08-09 SURGICAL SUPPLY — 19 items
BAG URINE DRAINAGE (UROLOGICAL SUPPLIES) IMPLANT
BAG URO CATCHER STRL LF (MISCELLANEOUS) ×5 IMPLANT
COVER FOOTSWITCH UNIV (MISCELLANEOUS) IMPLANT
COVER SURGICAL LIGHT HANDLE (MISCELLANEOUS) ×5 IMPLANT
ELECT REM PT RETURN 15FT ADLT (MISCELLANEOUS) ×5 IMPLANT
GLOVE BIOGEL M STRL SZ7.5 (GLOVE) ×5 IMPLANT
GOWN STRL REUS W/TWL LRG LVL3 (GOWN DISPOSABLE) ×5 IMPLANT
INST BIOPSY MAXCORE 18GX25 (NEEDLE) IMPLANT
INSTR BIOPSY MAXCORE 18GX20 (NEEDLE) ×5 IMPLANT
LOOP CUT BIPOLAR 24F LRG (ELECTROSURGICAL) ×5 IMPLANT
MANIFOLD NEPTUNE II (INSTRUMENTS) ×5 IMPLANT
PACK CYSTO (CUSTOM PROCEDURE TRAY) ×5 IMPLANT
SET ASPIRATION TUBING (TUBING) IMPLANT
SYR CONTROL 10ML LL (SYRINGE) IMPLANT
SYRINGE IRR TOOMEY STRL 70CC (SYRINGE) ×5 IMPLANT
TUBING CONNECTING 10 (TUBING) ×4 IMPLANT
TUBING CONNECTING 10' (TUBING) ×1
TUBING UROLOGY SET (TUBING) ×5 IMPLANT
UNDERPAD 30X30 (UNDERPADS AND DIAPERS) ×5 IMPLANT

## 2017-08-09 NOTE — Discharge Instructions (Signed)
1. You may see some blood in the urine and may have some burning with urination for 48-72 hours. You also may notice that you have to urinate more frequently or urgently after your procedure which is normal.  °2. You should call should you develop an inability urinate, fever > 101, persistent nausea and vomiting that prevents you from eating or drinking to stay hydrated.  °

## 2017-08-09 NOTE — Interval H&P Note (Signed)
History and Physical Interval Note:  08/09/2017 6:39 AM  Kavin Leech  has presented today for surgery, with the diagnosis of [place]  The various methods of treatment have been discussed with the patient and family. After consideration of risks, benefits and other options for treatment, the patient has consented to  Procedure(s) with comments: CYSTOSCOPY WITH PROSTATE NEEDLE BIOPSY (N/A) - GENERAL ANESTHESIA WITH PARALYSIS/ ONLY NEEDS 60 MIN FOR ALL PROCEDURES TRANSURETHRAL RESECTION OF BLADDER TUMOR (TURBT) (N/A) TRANSRECTAL ULTRASOUND (N/A) - ONLY NEEDS 60 MIN FOR ALL PROCEDURES as a surgical intervention .  The patient's history has been reviewed, patient examined, no change in status, stable for surgery.  I have reviewed the patient's chart and labs.  Questions were answered to the patient's satisfaction.     Janae Bonser,LES

## 2017-08-09 NOTE — Transfer of Care (Signed)
Immediate Anesthesia Transfer of Care Note  Patient: RUSELL MENEELY  Procedure(s) Performed: CYSTOSCOPY WITH PROSTATE NEEDLE BIOPSY (N/A Prostate) TRANSURETHRAL RESECTION OF BLADDER TUMOR (TURBT) (N/A Bladder) TRANSRECTAL ULTRASOUND (N/A Rectum)  Patient Location: PACU  Anesthesia Type:General  Level of Consciousness: awake and alert   Airway & Oxygen Therapy: Patient Spontanous Breathing and Patient connected to face mask oxygen  Post-op Assessment: Report given to RN and Post -op Vital signs reviewed and stable  Post vital signs: Reviewed and stable  Last Vitals:  Vitals Value Taken Time  BP    Temp    Pulse 66 08/09/2017  8:37 AM  Resp 15 08/09/2017  8:37 AM  SpO2 100 % 08/09/2017  8:37 AM  Vitals shown include unvalidated device data.  Last Pain:  Vitals:   08/09/17 0605  TempSrc:   PainSc: 0-No pain         Complications: No apparent anesthesia complications

## 2017-08-09 NOTE — Anesthesia Procedure Notes (Signed)
Procedure Name: Intubation Date/Time: 08/09/2017 7:40 AM Performed by: Lind Covert, CRNA Pre-anesthesia Checklist: Patient identified, Emergency Drugs available, Suction available, Patient being monitored and Timeout performed Patient Re-evaluated:Patient Re-evaluated prior to induction Oxygen Delivery Method: Circle system utilized Preoxygenation: Pre-oxygenation with 100% oxygen Induction Type: IV induction Ventilation: Mask ventilation without difficulty Laryngoscope Size: Mac and 4 Grade View: Grade I Tube type: Oral Tube size: 7.5 mm Number of attempts: 1 Airway Equipment and Method: Stylet Placement Confirmation: positive ETCO2,  breath sounds checked- equal and bilateral and ETT inserted through vocal cords under direct vision Secured at: 22 cm Tube secured with: Tape Dental Injury: Teeth and Oropharynx as per pre-operative assessment

## 2017-08-09 NOTE — Anesthesia Postprocedure Evaluation (Signed)
Anesthesia Post Note  Patient: Rodney [name]  Procedure(s) Performed: CYSTOSCOPY WITH PROSTATE NEEDLE BIOPSY (N/A Prostate) TRANSURETHRAL RESECTION OF BLADDER TUMOR (TURBT) (N/A Bladder) TRANSRECTAL ULTRASOUND (N/A Rectum)     Patient location during evaluation: PACU Anesthesia Type: General Level of consciousness: awake and alert, oriented and patient cooperative Pain management: pain level controlled Vital Signs Assessment: post-procedure vital signs reviewed and stable Respiratory status: spontaneous breathing, nonlabored ventilation and respiratory function stable Cardiovascular status: blood pressure returned to baseline and stable Postop Assessment: no apparent nausea or vomiting Anesthetic complications: no    Last Vitals:  Vitals:   08/09/17 1000 08/09/17 1025  BP: (!) 130/98 121/86  Pulse: 60   Resp: (!) 21 20  Temp: (!) 36.4 C 37 C  SpO2: 99% 98%    Last Pain:  Vitals:   08/09/17 1000  TempSrc:   PainSc: 4                  Syrianna Schillaci,E. Kaelyn Innocent

## 2017-08-09 NOTE — Op Note (Signed)
Preoperative diagnosis: 1. High-grade, T1 urothelial carcinoma the bladder 2. Elevated PSA  Postoperative diagnosis: 1.  High-grade, T1 urothelial carcinoma the bladder 2.  Elevated PSA  Procedure:  1. Cystoscopy 2. Transurethral resection of bladder tumor (2.5 cm) 3. Transrectal ultrasound-guided prostate needle biopsy 4. Prostate ultrasound  Surgeon: Pryor Curia. M.D.  Anesthesia: General  Complications: None  Intraoperative findings:  1. The patient's prior right sided lateral bladder tumor site was identified.  This measured approximately 2.5 cm with necrotic tissue consistent with his previous resection without obvious gross tumor noted.  EBL: Minimal  Specimens: 1. Re-resection of right lateral bladder tumor 2. Right lateral base prostate 3. Right base prostate 4. Right lateral mid prostate 5. Right mid prostate 6. Right lateral apex prostate 7. Right apex prostate 8. Left lateral base prostate 9. Left base prostate 10. Left lateral mid prostate 11. Left mid prostate 12. Left lateral apex prostate 13. Left apex prostate  Disposition of specimens: Pathology  Indication: Rodney [name] is a 47 year old gentleman who recently presented with hematuria and was found to have a right-sided bladder tumor.  He underwent an initial transurethral resection of his bladder tumor that demonstrated a high-grade, T1 urothelial carcinoma.  He presents today for repeat staging transurethral resection.  In addition, he was found to have an elevated PSA of 5.58 and is therefore also scheduled to undergo a prostate needle biopsy.  We have reviewed the potential risks, complications, and the expected recovery process with these procedures.  He gives informed consent to proceed.  Description of procedure: The patient was taken to the operating room and a general anesthetic was administered.  He was given preoperative antibiotics, placed in the dorsal lithotomy position, and  prepped and draped in usual sterile fashion.  A paralysis was ensured.  Cystourethroscopy was performed with a 55 French resectoscope sheath that was placed under direct vision with the visual obturator.  Systematic examination of bladder revealed the ureteral orifices to be in their expected anatomic location and effluxing clear urine.  Further inspection of the bladder revealed his previously resected tumor site on the right lateral bladder wall measuring approximately 2.5 cm.  There was noted be necrotic tissue without obvious persisting gross tumor.  No other bladder tumors, stones, or other mucosal pathology was identified.  Using loop bipolar cutting resection, the previous resection site was then re-resected in its entirety.  Once completely resected, hemostasis was ensured with bipolar cautery.  The tumor specimen was removed with a [place] syringe.  Reinspection of the bladder revealed no bleeding sites and excellent hemostasis.  Pelvic exam under anesthesia was then performed and revealed no prostate nodules.  The prostate was estimated to be approximately 40 cc on exam.  No pelvic masses were noted.  Attention then turned to the prostate biopsy.  The transrectal ultrasound probe was placed into the rectum and the prostate was visualized.  A prostate ultrasonography was performed with care to examine the prostate systematically in both the sagittal and transverse sections.  He did appear to have hyperechoic areas consistent with BPH nodules in the transition zone but without calcifications or hypoechoic areas.  No peripheral zone abnormalities were identified.  The prostate was measured and the volume was calculated at 39.7 cc.  Preparations were then made for the prostate biopsy.  All prostate biopsies were obtained under direct ultrasound guidance.  Using a spring-loaded prostate biopsy needle, biopsies were then obtained from the right lateral base, right base, right lateral mid, right  mid, right  lateral apex, right apex, left lateral base, left base, left lateral mid, left mid, left lateral apex, and left apex.  This was performed using a standard 12 core biopsy with a sextant biopsy schematic with biopsies of the lateral and parasagittal regions of each sextant area.  The biopsy probe was then removed.  A digital rectal exam revealed no significant bleeding.    The patient tolerated the procedure well without complications.  He was able to be extubated and transferred to the recovery unit in satisfactory condition.    Pryor Curia MD

## 2017-08-10 ENCOUNTER — Encounter (HOSPITAL_COMMUNITY): Payer: Self-pay | Admitting: Urology

## 2017-08-31 ENCOUNTER — Telehealth: Payer: Self-pay | Admitting: Medical Oncology

## 2017-08-31 NOTE — Telephone Encounter (Signed)
I called pt to introduce myself as the Prostate Nurse Navigator and the Coordinator of the Prostate [facility].  1. I confirmed with the patient he is aware of his referral to the clinic 09/11/17 arriving at 12:30pm.  2. I discussed the format of the clinic and the physicians he will be seeing that day.  3. I discussed where the clinic is located, [place] parking, registration and how to contact me.  4. I confirmed his address and informed him I would be mailing a packet of information and forms to be completed. I asked him to bring them with him the day of his appointment.   He voiced understanding of the above. I asked him to call me if he has any questions or concerns regarding his appointments or the forms he needs to complete.

## 2017-09-06 ENCOUNTER — Encounter: Payer: Self-pay | Admitting: Medical Oncology

## 2017-09-10 ENCOUNTER — Telehealth: Payer: Self-pay | Admitting: Medical Oncology

## 2017-09-10 ENCOUNTER — Encounter: Payer: Self-pay | Admitting: Radiation Oncology

## 2017-09-10 NOTE — Telephone Encounter (Signed)
Left a message reminding patient of appointment for [facility] 8/27 arriving at 12:30 pm. I reviewed location, valet parking, registration and reminded him to bring completed medical forms. I also reminded him to have lunch before arrival due to length of clinic.

## 2017-09-10 NOTE — Progress Notes (Signed)
GU Location of Tumor / Histology: prostatic adenocarcinoma   If Prostate Cancer, Gleason Score is (3 + 3) and PSA is (5.58). Prostate volume: 39.7 cc.  Rodney [name] presented in June 2019 to Dr. Alinda Money with gross hematuria. He was noted to have a bladder tumor and an elevated PSA of 5.58. The patient's brother has a history of prostate cancer.   Biopsies of prostate (if applicable) revealed:  FINAL DIAGNOSIS Diagnosis 1. Prostate, needle biopsy(ies), right base lateral PROSTATIC ADENOCARCINOMA, GLEASON SCORE 3+3=6 (GRADE GROUP 1) INVOLVES 10% OF ONE CORE. PERINEURAL INVASION IDENTIFIED 2. Prostate, needle biopsy(ies), right base medial BENIGN PROSTATIC TISSUE 3. Prostate, needle biopsy(ies), right mid lateral PROSTATIC ADENOCARCINOMA, GLEASON SCORE 3+3=6 INVOLVES 10% OF ONE CORE. 4. Prostate, needle biopsy(ies), right mid medial PROSTATIC ADENOCARCINOMA, GLEASON SCORE 3+3=6 DISCONTINUOUSLY INVOLVES 50% OF ONE CORE. 5. Prostate, needle biopsy(ies), right apex lateral BENIGN PROSTATIC TISSUE 6. Prostate, needle biopsy(ies), right apex medial PROSTATIC ADENOCARCINOMA, GLEASON SCORE 3+3=6 INVOLVES 80% OF ONE CORE. 7. Prostate, needle biopsy(ies), left base lateral BENIGN PROSTATIC TISSUE 8. Prostate, needle biopsy(ies), left base medial BENIGN PROSTATIC TISSUE 9. Prostate, needle biopsy(ies), left mid lateral PROSTATIC ADENOCARCINOMA, GLEASON SCORE 3+3=6 INVOLVES 50% OF ONE CORE. 10. Prostate, needle biopsy(ies), left mid medial BENIGN PROSTATIC TISSUE 11. Prostate, needle biopsy(ies), left apex lateral TWO SMALL FOCI OF PROSTATIC ADENOCARCINOMA, GLEASON SCORE 3+3=6 DISCONTINUOUSLY INVOLVES 50% OF ONE CORE. 12. Prostate, needle biopsy(ies), left apex medial BENIGN PROSTATIC TISSUE 1 of 3 FINAL for Rodney [name], Rodney [name] [phone]) Diagnosis(continued) 13. 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 SHIM 19.   Nausea/Vomiting, if any: no  Pain issues, if any:  Right side back pain managed with Percocet   SAFETY ISSUES:  Prior radiation? yes  Pacemaker/ICD? no  Possible current pregnancy? no  Is the patient on methotrexate? no  Current Complaints / other details:  47 year old male. Single. 2 daughters. Leaning toward aggressive primary surgical treatment.

## 2017-09-11 ENCOUNTER — Other Ambulatory Visit: Payer: Self-pay

## 2017-09-11 ENCOUNTER — Ambulatory Visit
Admission: RE | Admit: 2017-09-11 | Discharge: 2017-09-11 | Disposition: A | Discharge status: Home / Self Care | Payer: PRIVATE HEALTH INSURANCE | Source: Ambulatory Visit | Attending: Radiation Oncology | Admitting: Radiation Oncology

## 2017-09-11 ENCOUNTER — Encounter: Payer: Self-pay | Admitting: Radiation Oncology

## 2017-09-11 ENCOUNTER — Inpatient Hospital Stay: Payer: PRIVATE HEALTH INSURANCE | Attending: Oncology | Admitting: Oncology

## 2017-09-11 ENCOUNTER — Encounter: Payer: Self-pay | Admitting: Medical Oncology

## 2017-09-11 DIAGNOSIS — C679 Malignant neoplasm of bladder, unspecified: Secondary | ICD-10-CM

## 2017-09-11 DIAGNOSIS — C61 Malignant neoplasm of prostate: Secondary | ICD-10-CM | POA: Diagnosis present

## 2017-09-11 DIAGNOSIS — Z885 Allergy status to narcotic agent status: Secondary | ICD-10-CM | POA: Insufficient documentation

## 2017-09-11 DIAGNOSIS — Z87891 Personal history of nicotine dependence: Secondary | ICD-10-CM | POA: Insufficient documentation

## 2017-09-11 DIAGNOSIS — Z8042 Family history of malignant neoplasm of prostate: Secondary | ICD-10-CM | POA: Diagnosis not present

## 2017-09-11 DIAGNOSIS — Z923 Personal history of irradiation: Secondary | ICD-10-CM | POA: Diagnosis not present

## 2017-09-11 HISTORY — DX: Malignant neoplasm of prostate: C61

## 2017-09-11 HISTORY — DX: Malignant neoplasm of urinary organ, unspecified: C68.9
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
## 2017-09-11 NOTE — Progress Notes (Signed)
                               Care Plan Summary  Name: Rodney [name] DOB: October 17, 1970   Your Medical Team:   Urologist -  Dr. Raynelle Bring, [facility]  Radiation Oncologist - Dr. Tyler Pita, [facility]   Medical Oncologist - Dr. Zola Button, [place]  Recommendations: 1) Removal of bladder and prostate    * These recommendations are based on information available as of today's consult.      Recommendations may change depending on the results of further tests or exams.  Next Steps: 1) Dr. Lynne Logan office will schedule surgery    When appointments need to be scheduled, you will be contacted by [facility] and/or [facility].  Patient provided with business cards for all providers and a copy of "Fall Prevention Patient Safety Plan".  Questions?  Please do not hesitate to call Cira Rue, RN, BSN, OCN at [phone] any questions or concerns.  Rodney [name] is your Oncology Nurse Navigator and is available to assist you while you're receiving your medical care at [facility].
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
## 2017-09-12 ENCOUNTER — Other Ambulatory Visit (HOSPITAL_COMMUNITY): Payer: Self-pay | Admitting: Urology

## 2017-09-12 ENCOUNTER — Telehealth: Payer: Self-pay

## 2017-09-12 DIAGNOSIS — C672 Malignant neoplasm of lateral wall of bladder: Secondary | ICD-10-CM

## 2017-09-12 NOTE — Telephone Encounter (Signed)
Per 8/27 no los °

## 2017-09-13 ENCOUNTER — Encounter: Payer: Self-pay | Admitting: General Practice

## 2017-09-13 ENCOUNTER — Other Ambulatory Visit: Payer: Self-pay | Admitting: Medical Oncology

## 2017-09-13 ENCOUNTER — Telehealth: Payer: Self-pay | Admitting: General Practice

## 2017-09-13 DIAGNOSIS — C61 Malignant neoplasm of prostate: Secondary | ICD-10-CM

## 2017-09-13 NOTE — Telephone Encounter (Signed)
[place] CSW Progress Note  Request received from chaplain to follow up w patient due to "10" on Distress Screen. CSW left VM w contact information and encouraging patient to call back for help/support/resources.  CSW team alerted of potential need.   Edwyna Shell, LCSW Clinical Social Worker Phone:  [phone]

## 2017-09-13 NOTE — Progress Notes (Signed)
[place] participated in [facility] to introduce [facility] team/resources, reviewing distress screen per protocol.  The patient scored a 10 on the Psychosocial Distress Thermometer which indicates severe distress. LVM to assess further for distress and other psychosocial needs.  ONCBCN DISTRESS SCREENING 09/13/2017  Screening Type Initial Screening  Distress experienced in past week (1-10) 10  Family Problem type Partner;Children;Other (comment)  Emotional problem type Depression;Nervousness/Anxiety;Adjusting to illness;Isolation/feeling alone;Feeling hopeless;Boredom  Spiritual/Religous concerns type Loss of sense of purpose  Information Concerns Type Lack of info about diagnosis;Lack of info about treatment;Lack of info about complementary therapy choices;Lack of info about maintaining fitness  Physical Problem type Pain;Sleep/insomnia;Talking;Changes in urination;Sexual problems  Referral to support programs Yes    Follow up needed: Yes.  Representative from [employer] team will f/u by phone to offer further support and identify current needs. Please also page if needs arise/circumstances change. Thank you.   [place], [place], [facility] Pager [phone] Voicemail [phone]

## 2017-09-18 ENCOUNTER — Telehealth: Payer: Self-pay | Admitting: Medical Oncology

## 2017-09-18 NOTE — Telephone Encounter (Signed)
Spoke with [name] to follow up post [facility]. He states he is doing well and found the clinic helpful. He is scheduled for PET 9/10. He would like to know the date of his surgery and will reach out to Dr. Laney Pastor' office. I asked him to call me if I can help him or answer any questions. He voiced understanding.

## 2017-09-20 ENCOUNTER — Encounter: Payer: Self-pay | Admitting: General Practice

## 2017-09-20 NOTE — Progress Notes (Signed)
[facility] Spiritual Care Note  LVM again to f/u, offering spiritual/emotional support via Patient and [facility] and encouraging callback.   [place], [place], [facility] Pager [phone] Voicemail [phone]

## 2017-09-24 ENCOUNTER — Other Ambulatory Visit: Payer: Self-pay | Admitting: Urology

## 2017-09-25 ENCOUNTER — Ambulatory Visit (HOSPITAL_COMMUNITY)
Admission: RE | Admit: 2017-09-25 | Discharge: 2017-09-25 | Disposition: A | Discharge status: Home / Self Care | Payer: PRIVATE HEALTH INSURANCE | Source: Ambulatory Visit | Attending: Urology | Admitting: Urology

## 2017-09-25 DIAGNOSIS — C672 Malignant neoplasm of lateral wall of bladder: Secondary | ICD-10-CM | POA: Insufficient documentation

## 2017-09-25 DIAGNOSIS — I700 Atherosclerosis of aorta: Secondary | ICD-10-CM | POA: Diagnosis not present

## 2017-09-25 LAB — GLUCOSE, CAPILLARY: Glucose-Capillary: 91 mg/dL (ref 70–99)

## 2017-09-25 MED ORDER — FLUDEOXYGLUCOSE F - 18 (FDG) INJECTION
10.2100 | Freq: Once | INTRAVENOUS | Status: AC | PRN
  Administered 2017-09-25: 10.21 via INTRAVENOUS

## 2017-10-17 ENCOUNTER — Encounter: Payer: Self-pay | Admitting: Genetic Counselor

## 2017-10-17 ENCOUNTER — Inpatient Hospital Stay: Payer: PRIVATE HEALTH INSURANCE

## 2017-10-17 ENCOUNTER — Inpatient Hospital Stay: Payer: PRIVATE HEALTH INSURANCE | Attending: Oncology | Admitting: Genetic Counselor

## 2017-10-17 DIAGNOSIS — Z808 Family history of malignant neoplasm of other organs or systems: Secondary | ICD-10-CM | POA: Insufficient documentation

## 2017-10-17 DIAGNOSIS — C679 Malignant neoplasm of bladder, unspecified: Secondary | ICD-10-CM

## 2017-10-17 DIAGNOSIS — Z8042 Family history of malignant neoplasm of prostate: Secondary | ICD-10-CM

## 2017-10-17 DIAGNOSIS — C61 Malignant neoplasm of prostate: Secondary | ICD-10-CM

## 2017-10-17 DIAGNOSIS — C689 Malignant neoplasm of urinary organ, unspecified: Secondary | ICD-10-CM | POA: Insufficient documentation

## 2017-10-17 DIAGNOSIS — Z800 Family history of malignant neoplasm of digestive organs: Secondary | ICD-10-CM | POA: Diagnosis not present

## 2017-10-17 DIAGNOSIS — Z315 Encounter for genetic counseling: Secondary | ICD-10-CM

## 2017-10-17 NOTE — Progress Notes (Signed)
REFERRING PROVIDER: [name], MD [place], [place] [address]  PRIMARY PROVIDER:  Curlene Labrum, MD  PRIMARY REASON FOR VISIT:  1. Malignant neoplasm of prostate ([place])   2. Family history of prostate cancer   3. Family history of gastric cancer   4. Family history of melanoma   5. Family history of colon cancer   6. Urothelial cancer ([place])      HISTORY OF PRESENT ILLNESS:   Mr. Acevedo, a 47 y.o. male, was seen for a [place] cancer genetics consultation at the request of Dr. [name] due to a personal and family history of cancer.  Mr. Mcadory presents to clinic today to discuss the possibility of a hereditary predisposition to cancer, genetic testing, and to further clarify his future cancer risks, as well as potential cancer risks for family members.   In June 2019, at the age of 73, Mr. Cease was diagnosed with Prostate cancer.  The Gleason score is 3+3=6. This will be treated with surgery.  It is unclear if he can have more radiation.  Working up his prostate cancer, in July 2019, Mr. Gander was diagnosed with urothelial bladder cancer.    CANCER HISTORY:   No history exists.      Past Medical History:  Diagnosis Date  . Anxiety   . Bladder tumor   . Blood in urine    saw some 3 days ago but none inthe last 2 days   . Bradycardia   . Cancer ([place])   . Depression   . Dizziness    historical   . Elevated PSA   . Family history of colon cancer   . Family history of gastric cancer   . Family history of melanoma   . Family history of prostate cancer   . Heartburn    occasional   . Pain with urination   . Prostate cancer ([place])   . Urothelial cancer ([place])   . Urothelial carcinoma [facility])     Past Surgical History:  Procedure Laterality Date  . BACK SURGERY  2012   3 disc; dr Carloyn Manner  did first and dr elsner did the last 2   . CYSTOSCOPY W/ RETROGRADES N/A 07/02/2017   Procedure: CYSTOSCOPY WITH RETROGRADE PYELOGRAM/EXAM UNDER ANESTHESIA;   Surgeon: Raynelle Bring, MD;  [facility];  Service: Urology;  Laterality: N/A;  . CYSTOSCOPY WITH BIOPSY N/A 08/09/2017   Procedure: CYSTOSCOPY WITH PROSTATE NEEDLE BIOPSY;  Surgeon: Raynelle Bring, MD;  [facility];  Service: Urology;  Laterality: N/A;  GENERAL ANESTHESIA WITH PARALYSIS/ ONLY NEEDS 60 MIN FOR ALL PROCEDURES  . LEG SURGERY  2012   4 metal plates in plates   . TRANSRECTAL ULTRASOUND N/A 08/09/2017   Procedure: TRANSRECTAL ULTRASOUND;  Surgeon: Raynelle Bring, MD;  [facility];  Service: Urology;  Laterality: N/A;  ONLY NEEDS 60 MIN FOR ALL PROCEDURES  . TRANSURETHRAL RESECTION OF BLADDER TUMOR N/A 07/02/2017   Procedure: TRANSURETHRAL RESECTION OF BLADDER TUMOR (TURBT);  Surgeon: Raynelle Bring, MD;  [facility];  Service: Urology;  Laterality: N/A;  . TRANSURETHRAL RESECTION OF BLADDER TUMOR N/A 08/09/2017   Procedure: TRANSURETHRAL RESECTION OF BLADDER TUMOR (TURBT);  Surgeon: Raynelle Bring, MD;  [facility];  Service: Urology;  Laterality: N/A;    Social History   Socioeconomic History  . Marital status: Significant Other    Spouse name: Not on file  . Number of children: Not on file  . Years of education: Not  on file  . Highest education level: Not on file  Occupational History  . Not on file  Social Needs  . Financial resource strain: Not on file  . Food insecurity:    Worry: Not on file    Inability: Not on file  . Transportation needs:    Medical: Not on file    Non-medical: Not on file  Tobacco Use  . Smoking status: Former Smoker    Packs/day: 2.00    Years: 8.00    Pack years: 16.00    Types: Cigarettes    Last attempt to quit: 05/29/2016    Years since quitting: 1.3  . Smokeless tobacco: Former Systems developer  . Tobacco comment: quti 2009  Substance and Sexual Activity  . Alcohol use: No    Comment: very  little beer   . Drug use: Yes    Types: Marijuana    Comment: last use was 05-29-2017  . Sexual activity: Yes  Lifestyle  .  Physical activity:    Days per week: Not on file    Minutes per session: Not on file  . Stress: Not on file  Relationships  . Social connections:    Talks on phone: Not on file    Gets together: Not on file    Attends religious service: Not on file    Active member of club or organization: Not on file    Attends meetings of clubs or organizations: Not on file    Relationship status: Not on file  Other Topics Concern  . Not on file  Social History Narrative  . Not on file     FAMILY HISTORY:  We obtained a detailed, 4-generation family history.  Significant diagnoses are listed below: Family History  Problem Relation Age of Onset  . Heart attack Father   . Hypertension Father   . Diabetes Father   . Heart failure Father   . Hypertension Mother   . Gastric cancer Sister 39       d. 22  . Prostate cancer Brother 69  . Melanoma Maternal Aunt   . Stroke Maternal Uncle   . Stroke Paternal Uncle   . Melanoma Maternal Grandmother        dx under 61  . Colon cancer Maternal Grandmother   . Heart disease Maternal Grandfather   . Heart attack Paternal Grandfather     The patient ha two daughters who are cancer free.  He has two sisters, three full brothers and a maternal half brother.  One full brother was diagnosed with prostate cancer in May or June of this year.  One sister was diagnosed with stomach cancer at 46 and died at 50.  Both parents are deceased.  The patient's mother died at 77.  She had a brother and sister.  Her brother died of a stroke and her sister developed melanoma.  Both maternal grandparents are deceased.  The grandfather had melanoma under age 80 and colon cancer at 54.  The grandmother died of heart disease.  The patient's father died of a heart attack at 41.  He had one sister who had a stroke.  Both paternal grandparents are deceased.  The grandfather died of a heart attack at a young age.  Mr. Bonelli is unaware of previous family history of genetic  testing for hereditary cancer risks. Patient's maternal ancestors are of Caucasian descent, and paternal ancestors are of Caucasian descent. There is no reported Ashkenazi Jewish ancestry. There is no known consanguinity.  GENETIC COUNSELING ASSESSMENT: JERRARD BRADBURN is a 47 y.o. male with a personal history of prostate and urothelial cancer and family history of stomach, colon and prostate cancer, and melanoma which is somewhat suggestive of Lynch syndrome or other hereditary cancer syndrome and predisposition to cancer. We, therefore, discussed and recommended the following at today's visit.   DISCUSSION: We disucssed that about 15-20% of prostate cancer is hereditary, with most cases due to BRCA mutations.  Based on his urothelial cancer, his personal and family history of prostate cancer, as well as the family history of young stomach cancer and a colon cancer, we are more concerned about a mutation within the MMR genes causing Lynch syndrome.  There are other genes that cause all of these cancers, including melanoma, so we will also offer genetic testing for these additional genes.  We reviewed the characteristics, features and inheritance patterns of hereditary cancer syndromes. We also discussed genetic testing, including the appropriate family members to test, the process of testing, insurance coverage and turn-around-time for results. We discussed the implications of a negative, positive and/or variant of uncertain significant result. We recommended Mr. Worley pursue genetic testing for the multi-cancer gene panel. The Multi-Gene Panel offered by [facility] includes sequencing and/or deletion duplication testing of the following 84 genes: AIP, ALK, APC, ATM, AXIN2,BAP1,  BARD1, BLM, BMPR1A, BRCA1, BRCA2, BRIP1, CASR, CDC73, CDH1, CDK4, CDKN1B, CDKN1C, CDKN2A (p14ARF), CDKN2A (p16INK4a), CEBPA, CHEK2, CTNNA1, DICER1, DIS3L2, EGFR (c.2369C>T, p.Thr790Met variant only), EPCAM (Deletion/duplication testing  only), FH, FLCN, GATA2, GPC3, GREM1 (Promoter region deletion/duplication testing only), HOXB13 (c.251G>A, p.Gly84Glu), HRAS, KIT, MAX, MEN1, MET, MITF (c.952G>A, p.Glu318Lys variant only), MLH1, MSH2, MSH3, MSH6, MUTYH, NBN, NF1, NF2, NTHL1, PALB2, PDGFRA, PHOX2B, PMS2, POLD1, POLE, POT1, PRKAR1A, PTCH1, PTEN, RAD50, RAD51C, RAD51D, RB1, RECQL4, RET, RUNX1, SDHAF2, SDHA (sequence changes only), SDHB, SDHC, SDHD, SMAD4, SMARCA4, SMARCB1, SMARCE1, STK11, SUFU, TERC, TERT, TMEM127, TP53, TSC1, TSC2, VHL, WRN and WT1.    Based on Mr. Aldaco's personal and family history of cancer, he meets medical criteria for genetic testing. Despite that he meets criteria, he may still have an out of pocket cost. We discussed that if his out of pocket cost for testing is over $100, the laboratory will call and confirm whether he wants to proceed with testing.  If the out of pocket cost of testing is less than $100 he will be billed by the genetic testing laboratory.   In order to estimate his chance of having a Lynch syndrome mutation, we used statistical models (PREMM1,2,6) and laboratory data that take into account @HIS @ personal medical history, family history and ancestry.  Because each model is different, there can be a lot of variability in the risks they give.  Therefore, these numbers must be considered a rough range and not a precise risk of having a Lynch syndrome mutation.  These models estimate that she has approximately a 3.1% chance of having a mutation. Based on this assessment of her family and personal history, genetic testing is recommended.   PLAN: After considering the risks, benefits, and limitations, Mr. Schmuck  provided informed consent to pursue genetic testing and the blood sample was sent to [facility] for analysis of the Multi-cancer panel. Results should be available within approximately 2-3 weeks' time, at which point they will be disclosed by telephone to Mr. Garbers, as will any  additional recommendations warranted by these results. Mr. Zeck will receive a summary of his genetic counseling visit and a copy of his results once  available. This information will also be available in Epic. We encouraged Mr. Mapel to remain in contact with cancer genetics annually so that we can continuously update the family history and inform him of any changes in cancer genetics and testing that may be of benefit for his family. Mr. Koval questions were answered to his satisfaction today. Our contact information was provided should additional questions or concerns arise.  Lastly, we encouraged Mr. Voland to remain in contact with cancer genetics annually so that we can continuously update the family history and inform him of any changes in cancer genetics and testing that may be of benefit for this family.   Mr.  Urieta questions were answered to his satisfaction today. Our contact information was provided should additional questions or concerns arise. Thank you for the referral and allowing [place] to share in the care of your patient.   Karen P. Florene Glen, [place], [facility] Certified Genetic Counselor [email or web address] phone: [phone]  The patient was seen for a total of 60 minutes in face-to-face genetic counseling.  This patient was discussed with Drs. Magrinat, Lindi Adie and/or Burr Medico who agrees with the above.    _______________________________________________________________________ For Office Staff:  Number of people involved in session: 1 Was an Intern/ student involved with case: no

## 2017-10-26 ENCOUNTER — Encounter: Payer: Self-pay | Admitting: Genetic Counselor

## 2017-10-26 ENCOUNTER — Telehealth: Payer: Self-pay | Admitting: Genetic Counselor

## 2017-10-26 DIAGNOSIS — Z1379 Encounter for other screening for genetic and chromosomal anomalies: Secondary | ICD-10-CM | POA: Insufficient documentation

## 2017-10-26 NOTE — Telephone Encounter (Signed)
LM on VM that results were back and to please call. ?

## 2017-11-01 NOTE — Telephone Encounter (Signed)
Left second message on VM that results are back and to please call. 

## 2017-11-02 NOTE — Patient Instructions (Addendum)
SACHIT GILMAN  11/02/2017   Your procedure is scheduled on: 11-08-17     Report to [facility] Main  Entrance    Report to Admitting at 5:30  AM    Call this number if you have problems the morning of surgery [phone]     Remember: Do not eat food or drink liquids :After Midnight.    BRUSH YOUR TEETH MORNING OF SURGERY AND RINSE YOUR MOUTH OUT, NO CHEWING GUM CANDY OR MINTS.     Take these medicines the morning of surgery with A SIP OF WATER: None                                 You may not have any metal on your body including hair pins and              piercings  Do not wear jewelry, lotions, powders, cologne or deodorant             Men may shave face and neck.   Do not bring valuables to the hospital. [place].  Contacts, dentures or bridgework may not be worn into surgery.  Leave suitcase in the car. After surgery it may be brought to your room.    Special Instructions: N/A              Please read over the following fact sheets you were given: _____________________________________________________________________             [facility] - Preparing for Surgery Before surgery, you can play an important role.  Because skin is not sterile, your skin needs to be as free of germs as possible.  You can reduce the number of germs on your skin by washing with CHG (chlorahexidine gluconate) soap before surgery.  CHG is an antiseptic cleaner which kills germs and bonds with the skin to continue killing germs even after washing. Please DO NOT use if you have an allergy to CHG or antibacterial soaps.  If your skin becomes reddened/irritated stop using the CHG and inform your nurse when you arrive at Short Stay. Do not shave (including legs and underarms) for at least 48 hours prior to the first CHG shower.  You may shave your face/neck. Please follow these instructions carefully:  1.  Shower with CHG  Soap the night before surgery and the  morning of Surgery.  2.  If you choose to wash your hair, wash your hair first as usual with your  normal  shampoo.  3.  After you shampoo, rinse your hair and body thoroughly to remove the  shampoo.                           4.  Use CHG as you would any other liquid soap.  You can apply chg directly  to the skin and wash                       Gently with a scrungie or clean washcloth.  5.  Apply the CHG Soap to your body ONLY FROM THE NECK DOWN.   Do not use on face/ open  Wound or open sores. Avoid contact with eyes, ears mouth and genitals (private parts).                       Wash face,  Genitals (private parts) with your normal soap.             6.  Wash thoroughly, paying special attention to the area where your surgery  will be performed.  7.  Thoroughly rinse your body with warm water from the neck down.  8.  DO NOT shower/wash with your normal soap after using and rinsing off  the CHG Soap.                9.  Pat yourself dry with a clean towel.            10.  Wear clean pajamas.            11.  Place clean sheets on your bed the night of your first shower and do not  sleep with pets. Day of Surgery : Do not apply any lotions/deodorants the morning of surgery.  Please wear clean clothes to the hospital/surgery center.  FAILURE TO FOLLOW THESE INSTRUCTIONS MAY RESULT IN THE CANCELLATION OF YOUR SURGERY PATIENT SIGNATURE_________________________________  NURSE SIGNATURE__________________________________  ________________________________________________________________________  WHAT IS A BLOOD TRANSFUSION? Blood Transfusion Information  A transfusion is the replacement of blood or some of its parts. Blood is made up of multiple cells which provide different functions.  Red blood cells carry oxygen and are used for blood loss replacement.  White blood cells fight against infection.  Platelets control  bleeding.  Plasma helps clot blood.  Other blood products are available for specialized needs, such as hemophilia or other clotting disorders. BEFORE THE TRANSFUSION  Who gives blood for transfusions?   Healthy volunteers who are fully evaluated to make sure their blood is safe. This is blood bank blood. Transfusion therapy is the safest it has ever been in the practice of medicine. Before blood is taken from a donor, a complete history is taken to make sure that person has no history of diseases nor engages in risky social behavior (examples are intravenous drug use or sexual activity with multiple partners). The donor's travel history is screened to minimize risk of transmitting infections, such as malaria. The donated blood is tested for signs of infectious diseases, such as HIV and hepatitis. The blood is then tested to be sure it is compatible with you in order to minimize the chance of a transfusion reaction. If you or a relative donates blood, this is often done in anticipation of surgery and is not appropriate for emergency situations. It takes many days to process the donated blood. RISKS AND COMPLICATIONS Although transfusion therapy is very safe and saves many lives, the main dangers of transfusion include:   Getting an infectious disease.  Developing a transfusion reaction. This is an allergic reaction to something in the blood you were given. Every precaution is taken to prevent this. The decision to have a blood transfusion has been considered carefully by your caregiver before blood is given. Blood is not given unless the benefits outweigh the risks. AFTER THE TRANSFUSION  Right after receiving a blood transfusion, you will usually feel much better and more energetic. This is especially true if your red blood cells have gotten low (anemic). The transfusion raises the level of the red blood cells which carry oxygen, and this usually causes an energy increase.  The  nurse  administering the transfusion will monitor you carefully for complications. HOME CARE INSTRUCTIONS  No special instructions are needed after a transfusion. You may find your energy is better. Speak with your caregiver about any limitations on activity for underlying diseases you may have. SEEK MEDICAL CARE IF:   Your condition is not improving after your transfusion.  You develop redness or irritation at the intravenous (IV) site. SEEK IMMEDIATE MEDICAL CARE IF:  Any of the following symptoms occur over the next 12 hours:  Shaking chills.  You have a temperature by mouth above 102 F (38.9 C), not controlled by medicine.  Chest, back, or muscle pain.  People around you feel you are not acting correctly or are confused.  Shortness of breath or difficulty breathing.  Dizziness and fainting.  You get a rash or develop hives.  You have a decrease in urine output.  Your urine turns a dark color or changes to pink, red, or brown. Any of the following symptoms occur over the next 10 days:  You have a temperature by mouth above 102 F (38.9 C), not controlled by medicine.  Shortness of breath.  Weakness after normal activity.  The white part of the eye turns yellow (jaundice).  You have a decrease in the amount of urine or are urinating less often.  Your urine turns a dark color or changes to pink, red, or brown. Document Released: 12/31/1999 Document Revised: 03/27/2011 Document Reviewed: 08/19/2007 [facility] Patient Information 2014 [place], [place].  _______________________________________________________________________
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
## 2017-11-05 ENCOUNTER — Other Ambulatory Visit: Payer: Self-pay

## 2017-11-05 ENCOUNTER — Encounter (HOSPITAL_COMMUNITY): Payer: Self-pay

## 2017-11-05 ENCOUNTER — Encounter (HOSPITAL_COMMUNITY)
Admission: RE | Admit: 2017-11-05 | Discharge: 2017-11-05 | Disposition: A | Discharge status: Home / Self Care | Payer: PRIVATE HEALTH INSURANCE | Source: Ambulatory Visit | Attending: Urology | Admitting: Urology

## 2017-11-05 DIAGNOSIS — Z01812 Encounter for preprocedural laboratory examination: Secondary | ICD-10-CM | POA: Insufficient documentation

## 2017-11-05 LAB — CBC
HCT: 42.1 % (ref 39.0–52.0)
Hemoglobin: 14.2 g/dL (ref 13.0–17.0)
MCH: 33 pg (ref 26.0–34.0)
MCHC: 33.7 g/dL (ref 30.0–36.0)
MCV: 97.9 fL (ref 80.0–100.0)
Platelets: 247 10*3/uL (ref 150–400)
RBC: 4.3 MIL/uL (ref 4.22–5.81)
RDW: 11.7 % (ref 11.5–15.5)
WBC: 6.2 10*3/uL (ref 4.0–10.5)
nRBC: 0 % (ref 0.0–0.2)

## 2017-11-05 LAB — BASIC METABOLIC PANEL
ANION GAP: 6 (ref 5–15)
BUN: 14 mg/dL (ref 6–20)
CALCIUM: 9.7 mg/dL (ref 8.9–10.3)
CO2: 26 mmol/L (ref 22–32)
Chloride: 108 mmol/L (ref 98–111)
Creatinine, Ser: 0.97 mg/dL (ref 0.61–1.24)
GFR calc non Af Amer: >60 mL/min (ref >60)
Glucose, Bld: 100 mg/dL — ABNORMAL HIGH (ref 70–99)
Potassium: 4.3 mmol/L (ref 3.5–5.1)
SODIUM: 140 mmol/L (ref 135–145)

## 2017-11-05 LAB — ABO/RH: ABO/RH(D): O POS

## 2017-11-06 NOTE — Telephone Encounter (Signed)
Revealed negative genetic testing.  Discussed that we do not know why he has prostate and urothelial cancer or why there is cancer in the family. It could be due to a different gene that we are not testing, or maybe our current technology may not be able to pick something up.  It will be important for him to keep in contact with genetics to keep up with whether additional testing may be needed.

## 2017-11-07 NOTE — Anesthesia Preprocedure Evaluation (Addendum)
Anesthesia Evaluation  Patient identified by MRN, date of birth, ID band Patient awake    Reviewed: Allergy & Precautions, NPO status , Patient's Chart, lab work & pertinent test results  Airway Mallampati: II  TM Distance: >3 FB     Dental   Pulmonary former smoker,    breath sounds clear to auscultation       Cardiovascular  Rhythm:Regular Rate:Normal  History noted neg cardiac work up ?2004. CG   Neuro/Psych Anxiety Depression    GI/Hepatic negative GI ROS, Neg liver ROS,   Endo/Other  negative endocrine ROS  Renal/GU negative Renal ROS     Musculoskeletal   Abdominal   Peds  Hematology   Anesthesia Other Findings   Reproductive/Obstetrics                            Anesthesia Physical Anesthesia Plan  ASA: II  Anesthesia Plan: General   Post-op Pain Management:    Induction: Intravenous  PONV Risk Score and Plan: Treatment may vary due to age or medical condition  Airway Management Planned: Oral ETT  Additional Equipment: Arterial line  Intra-op Plan:   Post-operative Plan: Possible Post-op intubation/ventilation  Informed Consent: I have reviewed the patients History and Physical, chart, labs and discussed the procedure including the risks, benefits and alternatives for the proposed anesthesia with the patient or authorized representative who has indicated his/her understanding and acceptance.   Dental advisory given  Plan Discussed with: CRNA and Anesthesiologist  Anesthesia Plan Comments:        Anesthesia Quick Evaluation

## 2017-11-08 ENCOUNTER — Encounter (HOSPITAL_COMMUNITY)
Admission: RE | Disposition: A | Discharge status: Home / Self Care | Payer: Self-pay | Source: Home / Self Care | Attending: Urology

## 2017-11-08 ENCOUNTER — Ambulatory Visit: Payer: Self-pay | Admitting: Genetic Counselor

## 2017-11-08 ENCOUNTER — Inpatient Hospital Stay (HOSPITAL_COMMUNITY): Payer: PRIVATE HEALTH INSURANCE | Admitting: Anesthesiology

## 2017-11-08 ENCOUNTER — Encounter (HOSPITAL_COMMUNITY): Payer: Self-pay | Admitting: *Deleted

## 2017-11-08 ENCOUNTER — Other Ambulatory Visit: Payer: Self-pay

## 2017-11-08 ENCOUNTER — Inpatient Hospital Stay (HOSPITAL_COMMUNITY)
Admission: RE | Admit: 2017-11-08 | Discharge: 2017-11-14 | DRG: 654 | Disposition: A | Discharge status: Home / Self Care | Payer: PRIVATE HEALTH INSURANCE | Attending: Urology | Admitting: Urology

## 2017-11-08 DIAGNOSIS — C61 Malignant neoplasm of prostate: Secondary | ICD-10-CM | POA: Diagnosis present

## 2017-11-08 DIAGNOSIS — K567 Ileus, unspecified: Secondary | ICD-10-CM | POA: Diagnosis not present

## 2017-11-08 DIAGNOSIS — Z888 Allergy status to other drugs, medicaments and biological substances status: Secondary | ICD-10-CM | POA: Diagnosis not present

## 2017-11-08 DIAGNOSIS — Z1379 Encounter for other screening for genetic and chromosomal anomalies: Secondary | ICD-10-CM

## 2017-11-08 DIAGNOSIS — Z923 Personal history of irradiation: Secondary | ICD-10-CM | POA: Diagnosis not present

## 2017-11-08 DIAGNOSIS — K9189 Other postprocedural complications and disorders of digestive system: Secondary | ICD-10-CM | POA: Diagnosis not present

## 2017-11-08 DIAGNOSIS — R109 Unspecified abdominal pain: Secondary | ICD-10-CM

## 2017-11-08 DIAGNOSIS — C679 Malignant neoplasm of bladder, unspecified: Secondary | ICD-10-CM | POA: Diagnosis present

## 2017-11-08 DIAGNOSIS — Z885 Allergy status to narcotic agent status: Secondary | ICD-10-CM | POA: Diagnosis not present

## 2017-11-08 DIAGNOSIS — Z87891 Personal history of nicotine dependence: Secondary | ICD-10-CM | POA: Diagnosis not present

## 2017-11-08 DIAGNOSIS — D090 Carcinoma in situ of bladder: Principal | ICD-10-CM | POA: Diagnosis present

## 2017-11-08 DIAGNOSIS — Z906 Acquired absence of other parts of urinary tract: Secondary | ICD-10-CM

## 2017-11-08 DIAGNOSIS — Z9079 Acquired absence of other genital organ(s): Secondary | ICD-10-CM

## 2017-11-08 DIAGNOSIS — R310 Gross hematuria: Secondary | ICD-10-CM | POA: Diagnosis present

## 2017-11-08 LAB — BASIC METABOLIC PANEL
Anion gap: 9 (ref 5–15)
BUN: 20 mg/dL (ref 6–20)
CALCIUM: 8.2 mg/dL — AB (ref 8.9–10.3)
CO2: 23 mmol/L (ref 22–32)
CREATININE: 1.72 mg/dL — AB (ref 0.61–1.24)
Chloride: 106 mmol/L (ref 98–111)
GFR calc Af Amer: 52 mL/min — ABNORMAL LOW (ref >60)
GFR, EST NON AFRICAN AMERICAN: 45 mL/min — AB (ref >60)
GLUCOSE: 173 mg/dL — AB (ref 70–99)
Potassium: 5 mmol/L (ref 3.5–5.1)
SODIUM: 138 mmol/L (ref 135–145)

## 2017-11-08 LAB — HEMOGLOBIN AND HEMATOCRIT, BLOOD
HCT: 38.9 % — ABNORMAL LOW (ref 39.0–52.0)
HEMOGLOBIN: 12.8 g/dL — AB (ref 13.0–17.0)

## 2017-11-08 LAB — TYPE AND SCREEN
ABO/RH(D): O POS
ANTIBODY SCREEN: NEGATIVE

## 2017-11-08 LAB — MRSA PCR SCREENING: MRSA by PCR: NEGATIVE

## 2017-11-08 SURGERY — ROBOT ASSISTED LAPAROSCOPIC RADICAL CYSTOPROSTATECTOMY BILATERAL PELVIC LYMPHADENECTOMY,ORTHOTOPIC NEOBLADDER
Anesthesia: General

## 2017-11-08 MED ORDER — LACTATED RINGERS IV SOLN
INTRAVENOUS | Status: DC
  Administered 2017-11-08: 06:00:00 via INTRAVENOUS

## 2017-11-08 MED ORDER — ALVIMOPAN 12 MG PO CAPS
12.0000 mg | ORAL_CAPSULE | ORAL | Status: AC
  Administered 2017-11-08: 12 mg via ORAL
  Filled 2017-11-08: qty 1

## 2017-11-08 MED ORDER — LACTATED RINGERS IV SOLN
INTRAVENOUS | Status: DC | PRN
  Administered 2017-11-08: 1000 mL

## 2017-11-08 MED ORDER — MIDAZOLAM HCL 2 MG/2ML IJ SOLN
INTRAMUSCULAR | Status: AC
  Filled 2017-11-08: qty 2

## 2017-11-08 MED ORDER — DIPHENHYDRAMINE HCL 12.5 MG/5ML PO ELIX: 12.5000 mg | ORAL_SOLUTION | Freq: Four times a day (QID) | ORAL | Status: DC | PRN

## 2017-11-08 MED ORDER — ATROPINE SULFATE 0.4 MG/ML IV SOSY
PREFILLED_SYRINGE | INTRAVENOUS | Status: DC | PRN
  Administered 2017-11-08: .4 mg via INTRAVENOUS

## 2017-11-08 MED ORDER — KETOROLAC TROMETHAMINE 30 MG/ML IJ SOLN
INTRAMUSCULAR | Status: DC | PRN
  Administered 2017-11-08: 30 mg via INTRAVENOUS

## 2017-11-08 MED ORDER — HEPARIN SODIUM (PORCINE) 5000 UNIT/ML IJ SOLN
INTRAMUSCULAR | Status: DC | PRN
  Administered 2017-11-08: 5000 [IU] via SUBCUTANEOUS

## 2017-11-08 MED ORDER — ACETAMINOPHEN 10 MG/ML IV SOLN
INTRAVENOUS | Status: AC
  Filled 2017-11-08: qty 100

## 2017-11-08 MED ORDER — PROPOFOL 10 MG/ML IV BOLUS
INTRAVENOUS | Status: AC
  Filled 2017-11-08: qty 20

## 2017-11-08 MED ORDER — ZOLPIDEM TARTRATE 5 MG PO TABS: 5.0000 mg | ORAL_TABLET | Freq: Every evening | ORAL | Status: DC | PRN

## 2017-11-08 MED ORDER — ALVIMOPAN 12 MG PO CAPS
12.0000 mg | ORAL_CAPSULE | Freq: Two times a day (BID) | ORAL | Status: DC
  Administered 2017-11-09 – 2017-11-12 (×6): 12 mg via ORAL
  Filled 2017-11-08 (×7): qty 1

## 2017-11-08 MED ORDER — ROCURONIUM BROMIDE 10 MG/ML (PF) SYRINGE
PREFILLED_SYRINGE | INTRAVENOUS | Status: AC
  Filled 2017-11-08: qty 10

## 2017-11-08 MED ORDER — BUPIVACAINE HCL (PF) 0.25 % IJ SOLN
INTRAMUSCULAR | Status: AC
  Filled 2017-11-08: qty 30

## 2017-11-08 MED ORDER — HYDROMORPHONE HCL 1 MG/ML IJ SOLN
INTRAMUSCULAR | Status: DC | PRN
  Administered 2017-11-08 (×4): 0.5 mg via INTRAVENOUS

## 2017-11-08 MED ORDER — BUPIVACAINE LIPOSOME 1.3 % IJ SUSP
20.0000 mL | Freq: Once | INTRAMUSCULAR | Status: AC
  Administered 2017-11-08: 20 mL
  Filled 2017-11-08: qty 20

## 2017-11-08 MED ORDER — SODIUM CHLORIDE 0.9 % IV SOLN
1.0000 g | Freq: Three times a day (TID) | INTRAVENOUS | Status: AC
  Administered 2017-11-08 – 2017-11-09 (×2): 1 g via INTRAVENOUS
  Filled 2017-11-08 (×2): qty 1

## 2017-11-08 MED ORDER — KCL IN DEXTROSE-NACL 20-5-0.45 MEQ/L-%-% IV SOLN
INTRAVENOUS | Status: DC
  Administered 2017-11-08 – 2017-11-13 (×16): via INTRAVENOUS
  Filled 2017-11-08 (×18): qty 1000

## 2017-11-08 MED ORDER — DIPHENHYDRAMINE HCL 50 MG/ML IJ SOLN: 12.5000 mg | Freq: Four times a day (QID) | INTRAMUSCULAR | Status: DC | PRN

## 2017-11-08 MED ORDER — SUFENTANIL CITRATE 50 MCG/ML IV SOLN
INTRAVENOUS | Status: DC | PRN
  Administered 2017-11-08 (×5): 10 ug via INTRAVENOUS
  Administered 2017-11-08: 20 ug via INTRAVENOUS
  Administered 2017-11-08 (×3): 10 ug via INTRAVENOUS

## 2017-11-08 MED ORDER — LIDOCAINE 2% (20 MG/ML) 5 ML SYRINGE
INTRAMUSCULAR | Status: DC | PRN
  Administered 2017-11-08: 1.5 mg/kg/h via INTRAVENOUS

## 2017-11-08 MED ORDER — SODIUM CHLORIDE 0.9 % IV SOLN
INTRAVENOUS | Status: AC
  Filled 2017-11-08: qty 2

## 2017-11-08 MED ORDER — LIP MEDEX EX OINT
TOPICAL_OINTMENT | CUTANEOUS | Status: AC
  Filled 2017-11-08: qty 7

## 2017-11-08 MED ORDER — HEPARIN SODIUM (PORCINE) 1000 UNIT/ML IJ SOLN
INTRAMUSCULAR | Status: AC
  Filled 2017-11-08: qty 1

## 2017-11-08 MED ORDER — KETAMINE HCL 10 MG/ML IJ SOLN
INTRAMUSCULAR | Status: AC
  Filled 2017-11-08: qty 1

## 2017-11-08 MED ORDER — SODIUM CHLORIDE 0.9 % IJ SOLN
INTRAMUSCULAR | Status: AC
  Filled 2017-11-08: qty 10

## 2017-11-08 MED ORDER — HYDROMORPHONE HCL 2 MG/ML IJ SOLN
INTRAMUSCULAR | Status: AC
  Filled 2017-11-08: qty 1

## 2017-11-08 MED ORDER — LIDOCAINE 2% (20 MG/ML) 5 ML SYRINGE
INTRAMUSCULAR | Status: DC | PRN
  Administered 2017-11-08: 100 mg via INTRAVENOUS

## 2017-11-08 MED ORDER — DEXAMETHASONE SODIUM PHOSPHATE 10 MG/ML IJ SOLN
INTRAMUSCULAR | Status: DC | PRN
  Administered 2017-11-08: 10 mg via INTRAVENOUS

## 2017-11-08 MED ORDER — SUFENTANIL CITRATE 50 MCG/ML IV SOLN
INTRAVENOUS | Status: AC
  Filled 2017-11-08: qty 1

## 2017-11-08 MED ORDER — SUGAMMADEX SODIUM 200 MG/2ML IV SOLN
INTRAVENOUS | Status: DC | PRN
  Administered 2017-11-08: 220 mg via INTRAVENOUS

## 2017-11-08 MED ORDER — ENOXAPARIN SODIUM 40 MG/0.4ML ~~LOC~~ SOLN
40.0000 mg | SUBCUTANEOUS | Status: DC
  Administered 2017-11-09 – 2017-11-14 (×6): 40 mg via SUBCUTANEOUS
  Filled 2017-11-08 (×6): qty 0.4

## 2017-11-08 MED ORDER — ACETAMINOPHEN 10 MG/ML IV SOLN
1000.0000 mg | Freq: Four times a day (QID) | INTRAVENOUS | Status: AC
  Administered 2017-11-08 – 2017-11-09 (×4): 1000 mg via INTRAVENOUS
  Filled 2017-11-08 (×3): qty 100

## 2017-11-08 MED ORDER — SODIUM CHLORIDE 0.9 % IJ SOLN
INTRAMUSCULAR | Status: DC | PRN
  Administered 2017-11-08: 20 mL

## 2017-11-08 MED ORDER — KETOROLAC TROMETHAMINE 15 MG/ML IJ SOLN
15.0000 mg | Freq: Four times a day (QID) | INTRAMUSCULAR | Status: AC
  Administered 2017-11-08 – 2017-11-09 (×6): 15 mg via INTRAVENOUS
  Filled 2017-11-08 (×6): qty 1

## 2017-11-08 MED ORDER — LIDOCAINE 2% (20 MG/ML) 5 ML SYRINGE
INTRAMUSCULAR | Status: AC
  Filled 2017-11-08: qty 10

## 2017-11-08 MED ORDER — LIDOCAINE HCL 2 % IJ SOLN
INTRAMUSCULAR | Status: AC
  Filled 2017-11-08: qty 20

## 2017-11-08 MED ORDER — HYDROMORPHONE HCL 1 MG/ML IJ SOLN
0.5000 mg | INTRAMUSCULAR | Status: DC | PRN
  Administered 2017-11-08: 1 mg via INTRAVENOUS
  Administered 2017-11-08: 0.5 mg via INTRAVENOUS
  Administered 2017-11-09: 1 mg via INTRAVENOUS
  Administered 2017-11-09: 0.5 mg via INTRAVENOUS
  Administered 2017-11-09: 1 mg via INTRAVENOUS
  Administered 2017-11-09: 0.5 mg via INTRAVENOUS
  Administered 2017-11-09 (×2): 1 mg via INTRAVENOUS
  Administered 2017-11-09: 0.5 mg via INTRAVENOUS
  Administered 2017-11-09 – 2017-11-11 (×12): 1 mg via INTRAVENOUS
  Administered 2017-11-11 (×2): 0.5 mg via INTRAVENOUS
  Administered 2017-11-11 (×2): 1 mg via INTRAVENOUS
  Administered 2017-11-11: 0.5 mg via INTRAVENOUS
  Administered 2017-11-11: 1 mg via INTRAVENOUS
  Administered 2017-11-11 – 2017-11-12 (×2): 0.5 mg via INTRAVENOUS
  Administered 2017-11-12 (×2): 1 mg via INTRAVENOUS
  Administered 2017-11-12: 0.5 mg via INTRAVENOUS
  Administered 2017-11-12 – 2017-11-13 (×3): 1 mg via INTRAVENOUS
  Filled 2017-11-08 (×38): qty 1

## 2017-11-08 MED ORDER — ONDANSETRON HCL 4 MG/2ML IJ SOLN
INTRAMUSCULAR | Status: AC
  Filled 2017-11-08: qty 2

## 2017-11-08 MED ORDER — DEXAMETHASONE SODIUM PHOSPHATE 10 MG/ML IJ SOLN
INTRAMUSCULAR | Status: AC
  Filled 2017-11-08: qty 1

## 2017-11-08 MED ORDER — HEPARIN SODIUM (PORCINE) 5000 UNIT/ML IJ SOLN
INTRAMUSCULAR | Status: AC
  Filled 2017-11-08: qty 1

## 2017-11-08 MED ORDER — 0.9 % SODIUM CHLORIDE (POUR BTL) OPTIME
TOPICAL | Status: DC | PRN
  Administered 2017-11-08: 1000 mL

## 2017-11-08 MED ORDER — LACTATED RINGERS IV SOLN
INTRAVENOUS | Status: DC | PRN
  Administered 2017-11-08 (×4): via INTRAVENOUS

## 2017-11-08 MED ORDER — SUGAMMADEX SODIUM 500 MG/5ML IV SOLN
INTRAVENOUS | Status: AC
  Filled 2017-11-08: qty 5

## 2017-11-08 MED ORDER — BUPIVACAINE HCL (PF) 0.25 % IJ SOLN
INTRAMUSCULAR | Status: DC | PRN
  Administered 2017-11-08: 30 mL

## 2017-11-08 MED ORDER — LIDOCAINE 2% (20 MG/ML) 5 ML SYRINGE
INTRAMUSCULAR | Status: AC
  Filled 2017-11-08: qty 5

## 2017-11-08 MED ORDER — SODIUM CHLORIDE 0.9 % IV SOLN
2.0000 g | INTRAVENOUS | Status: AC
  Administered 2017-11-08 (×2): 2 g via INTRAVENOUS
  Filled 2017-11-08: qty 2

## 2017-11-08 MED ORDER — ONDANSETRON HCL 4 MG/2ML IJ SOLN
INTRAMUSCULAR | Status: DC | PRN
  Administered 2017-11-08: 4 mg via INTRAVENOUS

## 2017-11-08 MED ORDER — MIDAZOLAM HCL 2 MG/2ML IJ SOLN
INTRAMUSCULAR | Status: DC | PRN
  Administered 2017-11-08: 2 mg via INTRAVENOUS

## 2017-11-08 MED ORDER — KETOROLAC TROMETHAMINE 30 MG/ML IJ SOLN
INTRAMUSCULAR | Status: AC
  Filled 2017-11-08: qty 1

## 2017-11-08 MED ORDER — FENTANYL CITRATE (PF) 100 MCG/2ML IJ SOLN
25.0000 ug | INTRAMUSCULAR | Status: DC | PRN
  Administered 2017-11-08 (×3): 50 ug via INTRAVENOUS

## 2017-11-08 MED ORDER — PROPOFOL 10 MG/ML IV BOLUS
INTRAVENOUS | Status: DC | PRN
  Administered 2017-11-08: 170 mg via INTRAVENOUS

## 2017-11-08 MED ORDER — FENTANYL CITRATE (PF) 100 MCG/2ML IJ SOLN
INTRAMUSCULAR | Status: AC
  Filled 2017-11-08: qty 4

## 2017-11-08 MED ORDER — ROCURONIUM BROMIDE 10 MG/ML (PF) SYRINGE
PREFILLED_SYRINGE | INTRAVENOUS | Status: DC | PRN
  Administered 2017-11-08: 5 mg via INTRAVENOUS
  Administered 2017-11-08: 20 mg via INTRAVENOUS
  Administered 2017-11-08 (×2): 10 mg via INTRAVENOUS
  Administered 2017-11-08 (×3): 20 mg via INTRAVENOUS
  Administered 2017-11-08: 60 mg via INTRAVENOUS
  Administered 2017-11-08 (×5): 20 mg via INTRAVENOUS
  Administered 2017-11-08: 10 mg via INTRAVENOUS

## 2017-11-08 MED ORDER — ONDANSETRON HCL 4 MG/2ML IJ SOLN: 4.0000 mg | INTRAMUSCULAR | Status: DC | PRN

## 2017-11-08 MED ORDER — HYDROMORPHONE HCL 1 MG/ML IJ SOLN
0.2500 mg | INTRAMUSCULAR | Status: DC | PRN
  Administered 2017-11-08: 0.5 mg via INTRAVENOUS

## 2017-11-08 MED ORDER — HYDROMORPHONE HCL 1 MG/ML IJ SOLN
INTRAMUSCULAR | Status: AC
  Administered 2017-11-08: 0.5 mg
  Filled 2017-11-08: qty 2

## 2017-11-08 MED ORDER — KETAMINE HCL 10 MG/ML IJ SOLN
INTRAMUSCULAR | Status: DC | PRN
  Administered 2017-11-08: 5 mg via INTRAVENOUS
  Administered 2017-11-08 (×4): 10 mg via INTRAVENOUS
  Administered 2017-11-08: 5 mg via INTRAVENOUS
  Administered 2017-11-08: 50 mg via INTRAVENOUS
  Administered 2017-11-08: 10 mg via INTRAVENOUS

## 2017-11-08 SURGICAL SUPPLY — 98 items
APPLICATOR COTTON TIP 6 STRL (MISCELLANEOUS) ×1 IMPLANT
APPLICATOR COTTON TIP 6IN STRL (MISCELLANEOUS) ×3
APPLICATOR SURGIFLO ENDO (HEMOSTASIS) IMPLANT
BAG LAPAROSCOPIC 12 15 PORT 16 (BASKET) ×1 IMPLANT
BAG RETRIEVAL 12/15 (BASKET) ×2
BAG RETRIEVAL 12/15MM (BASKET) ×1
BLADE SURG SZ10 CARB STEEL (BLADE) IMPLANT
CATH FOLEY 2WAY SLVR 30CC 20FR (CATHETERS) ×3 IMPLANT
CATH HEMA 3WAY 30CC 24FR COUDE (CATHETERS) IMPLANT
CATH ROBINSON RED A/P 16FR (CATHETERS) IMPLANT
CHLORAPREP W/TINT 26ML (MISCELLANEOUS) ×3 IMPLANT
CLIP VESOLOCK LG 6/CT PURPLE (CLIP) ×21 IMPLANT
CLIP VESOLOCK MED LG 6/CT (CLIP) ×3 IMPLANT
CLIP VESOLOCK XL 6/CT (CLIP) ×3 IMPLANT
CONT SPEC 4OZ CLIKSEAL STRL BL (MISCELLANEOUS) ×3 IMPLANT
COVER SURGICAL LIGHT HANDLE (MISCELLANEOUS) ×3 IMPLANT
COVER TIP SHEARS 8 DVNC (MISCELLANEOUS) ×2 IMPLANT
COVER TIP SHEARS 8MM DA VINCI (MISCELLANEOUS) ×4
COVER WAND RF STERILE (DRAPES) ×3 IMPLANT
CUTTER ECHEON FLEX ENDO 45 340 (ENDOMECHANICALS) ×3 IMPLANT
DECANTER SPIKE VIAL GLASS SM (MISCELLANEOUS) ×3 IMPLANT
DERMABOND ADVANCED (GAUZE/BANDAGES/DRESSINGS) ×4
DERMABOND ADVANCED .7 DNX12 (GAUZE/BANDAGES/DRESSINGS) ×2 IMPLANT
DRAIN CHANNEL 15F RND FF 3/16 (WOUND CARE) IMPLANT
DRAIN CHANNEL 19F RND (DRAIN) ×3 IMPLANT
DRAPE ARM DVNC X/XI (DISPOSABLE) ×4 IMPLANT
DRAPE COLUMN DVNC XI (DISPOSABLE) ×1 IMPLANT
DRAPE DA VINCI XI ARM (DISPOSABLE) ×8
DRAPE DA VINCI XI COLUMN (DISPOSABLE) ×2
DRAPE WARM FLUID 44X44 (DRAPE) ×3 IMPLANT
DRSG TEGADERM 4X4.75 (GAUZE/BANDAGES/DRESSINGS) ×6 IMPLANT
ELECT REM PT RETURN 15FT ADLT (MISCELLANEOUS) ×3 IMPLANT
EVACUATOR SILICONE 100CC (DRAIN) ×3 IMPLANT
GLOVE BIO SURGEON STRL SZ 6.5 (GLOVE) ×4 IMPLANT
GLOVE BIO SURGEONS STRL SZ 6.5 (GLOVE) ×2
GLOVE BIOGEL M STRL SZ7.5 (GLOVE) ×9 IMPLANT
GOWN STRL REUS W/TWL LRG LVL3 (GOWN DISPOSABLE) ×9 IMPLANT
HOLDER FOLEY CATH W/STRAP (MISCELLANEOUS) ×3 IMPLANT
IRRIG SUCT STRYKERFLOW 2 WTIP (MISCELLANEOUS) ×3
IRRIGATION SUCT STRKRFLW 2 WTP (MISCELLANEOUS) ×1 IMPLANT
LIGASURE IMPACT 36 18CM CVD LR (INSTRUMENTS) ×3 IMPLANT
LIGASURE VESSEL 5MM BLUNT TIP (ELECTROSURGICAL) IMPLANT
LOOP VESSEL MAXI BLUE (MISCELLANEOUS) ×3 IMPLANT
NDL SAFETY ECLIPSE 18X1.5 (NEEDLE) ×1 IMPLANT
NEEDLE HYPO 18GX1.5 SHARP (NEEDLE) ×2
NS IRRIG 1000ML POUR BTL (IV SOLUTION) ×3 IMPLANT
PACK ROBOT UROLOGY CUSTOM (CUSTOM PROCEDURE TRAY) ×3 IMPLANT
PLUG CATH AND CAP STER (CATHETERS) ×3 IMPLANT
POSITIONER SURGICAL ARM (MISCELLANEOUS) ×6 IMPLANT
POUCH SPECIMEN RETRIEVAL 10MM (ENDOMECHANICALS) ×3 IMPLANT
RELOAD PROXIMATE 75MM BLUE (ENDOMECHANICALS) ×9 IMPLANT
RELOAD PROXIMATE TA60MM BLUE (ENDOMECHANICALS) IMPLANT
RELOAD STAPLER GOLD 60MM (STAPLE) IMPLANT
RELOAD STAPLER WHITE 60MM (STAPLE) IMPLANT
SEAL CANN UNIV 5-8 DVNC XI (MISCELLANEOUS) ×4 IMPLANT
SEAL XI 5MM-8MM UNIVERSAL (MISCELLANEOUS) ×8
SOLUTION ELECTROLUBE (MISCELLANEOUS) ×3 IMPLANT
SPONGE LAP 18X18 RF (DISPOSABLE) ×6 IMPLANT
SPONGE LAP 4X18 RFD (DISPOSABLE) ×3 IMPLANT
STAPLE RELOAD 45 GRN (STAPLE) ×1 IMPLANT
STAPLE RELOAD 45MM GREEN (STAPLE) ×2
STAPLER ECHELON LONG 60 440 (INSTRUMENTS) IMPLANT
STAPLER GUN LINEAR PROX 60 (STAPLE) IMPLANT
STAPLER PROXIMATE 75MM BLUE (STAPLE) ×3 IMPLANT
STAPLER RELOAD GOLD 60MM (STAPLE)
STAPLER RELOAD WHITE 60MM (STAPLE)
STENT SET URETHERAL LEFT 7FR (STENTS) ×3 IMPLANT
STENT SET URETHERAL RIGHT 7FR (STENTS) ×3 IMPLANT
SURGIFLO W/THROMBIN 8M KIT (HEMOSTASIS) IMPLANT
SUT CHROMIC 3 0 SH 27 (SUTURE) IMPLANT
SUT ETHILON 3 0 PS 1 (SUTURE) ×6 IMPLANT
SUT MNCRL AB 4-0 PS2 18 (SUTURE) ×6 IMPLANT
SUT MON 2-0 UR5 36 ×18 IMPLANT
SUT MONO 2 0 UR5 27IN (SUTURE) ×18 IMPLANT
SUT PDS AB 1 CTX 36 (SUTURE) IMPLANT
SUT PDS AB 1 TP1 96 (SUTURE) ×6 IMPLANT
SUT SILK 3 0 SH CR/8 (SUTURE) ×6 IMPLANT
SUT VIC AB 0 CT1 27 (SUTURE) ×4
SUT VIC AB 0 CT1 27XBRD ANTBC (SUTURE) ×2 IMPLANT
SUT VIC AB 2-0 CT1 27 (SUTURE) ×2
SUT VIC AB 2-0 CT1 TAPERPNT 27 (SUTURE) ×1 IMPLANT
SUT VIC AB 2-0 SH 27 (SUTURE) ×2
SUT VIC AB 2-0 SH 27X BRD (SUTURE) ×1 IMPLANT
SUT VIC AB 3-0 SH 27 (SUTURE) ×20
SUT VIC AB 3-0 SH 27X BRD (SUTURE) ×3 IMPLANT
SUT VIC AB 3-0 SH 27XBRD (SUTURE) ×7 IMPLANT
SUT VIC AB 4-0 RB1 18 (SUTURE) IMPLANT
SUT VIC AB 4-0 RB1 27 (SUTURE) ×18
SUT VIC AB 4-0 RB1 27XBRD (SUTURE) ×9 IMPLANT
SUT VIC AB 4-0 SH 18 (SUTURE) IMPLANT
SYRINGE 3CC LL L/F (MISCELLANEOUS) ×3 IMPLANT
SYSTEM UROSTOMY GENTLE TOUCH (WOUND CARE) IMPLANT
TOWEL OR NON WOVEN STRL DISP B (DISPOSABLE) ×3 IMPLANT
TUBE RECTAL 22F 20 RED (TUBING) ×3 IMPLANT
TUBE RECTAL 26F 20 RED (TUBING) IMPLANT
TUBING INSUFFLATION 10FT LAP (TUBING) ×3 IMPLANT
WATER STERILE IRR 1000ML POUR (IV SOLUTION) ×3 IMPLANT
YANKAUER SUCT BULB TIP 10FT TU (MISCELLANEOUS) ×3 IMPLANT

## 2017-11-08 NOTE — Progress Notes (Signed)
Patient ID: Rodney [name], male   DOB: 01-Jan-1971, 47 y.o.   MRN: [id number]  Post-op note  Subjective: The patient is doing well.  No complaints.  Objective: Vital signs in last 24 hours: Temp:  [98.1 F (36.7 C)-100.5 F (38.1 C)] 98.4 F (36.9 C) (10/24 1744) Pulse Rate:  [74-99] 86 (10/24 1739) Resp:  [9-18] 9 (10/24 1739) BP: (129-146)/(87-102) 146/99 (10/24 1739) SpO2:  [97 %-100 %] 97 % (10/24 1739)  Intake/Output from previous day: No intake/output data recorded. Intake/Output this shift: Total I/O In: 4200 [I.V.:4000; IV Piggyback:200] Out: 665 [Urine:250; Drains:65; Blood:350]  Physical Exam:  General: Alert and oriented. Abdomen: Soft, Nondistended. Incisions: Clean and dry. GU: Urine draining well.  Lab Results: Recent Labs    11/08/17 1619  HGB 12.8*  HCT 38.9*    Assessment/Plan: POD#0   1) Continue to monitor, OOB to chair tonight   Pryor Curia. MD   LOS: 0 days   Torrance Stockley,LES 11/08/2017, 6:33 PM

## 2017-11-08 NOTE — Anesthesia Postprocedure Evaluation (Signed)
Anesthesia Post Note  Patient: Rodney [name]  Procedure(s) Performed: ROBOT ASSISTED LAPAROSCOPIC RADICAL CYSTOPROSTATECTOMY BILATERAL PELVIC LYMPHADENECTOMY,ORTHOTOPIC NEOBLADDER (N/A )     Patient location during evaluation: PACU Anesthesia Type: General Level of consciousness: awake Pain management: pain level controlled Vital Signs Assessment: post-procedure vital signs reviewed and stable Respiratory status: spontaneous breathing Cardiovascular status: stable Postop Assessment: no apparent nausea or vomiting Anesthetic complications: no    Last Vitals:  Vitals:   11/08/17 1605 11/08/17 1615  BP: (!) 136/101 (!) 145/98  Pulse: 90 86  Resp: 12 14  Temp: (!) 38.1 C   SpO2: 100% 100%    Last Pain:  Vitals:   11/08/17 0553  TempSrc:   PainSc: 2                  Maud Rubendall

## 2017-11-08 NOTE — Op Note (Signed)
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 Robotic assisted laparoscopic radical cystoprostatectomy 2. Bilateral extended robotic assisted laparoscopic pelvic lymphadenectomy 3. Orthotopic ileal neobladder creation  Surgeon: Pryor Curia. M.D.  Assistant: Debbrah Alar, PA-C  An assistant was required for this surgical procedure.  The duties of the assistant included but were not limited to suctioning, passing suture, camera manipulation, retraction. This procedure would not be able to be performed without an Environmental consultant.  Resident: Dr. Basilio Cairo  Anesthesia: General  Complications: None  EBL: 350 mL  IVF:  3700 mL crystalloid  Intraoperative findings: 1. Left ureteral margin: Benign 2. Right ureteral margin: Benign 3. Urethral margin: Benign  Specimens: 1. Bladder, prostate, and seminal vesicles 2. Left hypogastric and obturator lymph nodes 3. Left external iliac and common iliac lymph nodes 4. Right hypogastric and obturator lymph nodes 5. Right hypogastric and obturator lymph nodes  Disposition of specimens: Pathology  Drains: # 19 Blake drain to pelvis and # 19 Blake drain to abdomen  Indication: Rodney [name] is a 47 y.o. patient with urothelial carcinoma of the bladder and prostate cancer. After a thorough review of the management options for treatment of bladder cancer, he elected to proceed with surgical treatment and the above procedure.  We have discussed the potential benefits and risks of the procedure, side effects of the proposed treatment, the likelihood of the patient achieving the goals of the procedure, and any potential problems that might occur during the procedure or recuperation. Informed consent has been  obtained.  Description of procedure:  The patient was taken to the operating room and a general anesthetic was administered. He was given preoperative antibiotics, placed in the dorsal lithotomy position, and prepped and draped in the usual sterile fashion. Next a preoperative timeout was performed. A urethral catheter was placed into the bladder and a site was selected near the umbilicus for placement of the camera port. This was placed using a standard open Hassan technique which allowed entry into the peritoneal cavity under direct vision and without difficulty. A 12 mm port was placed and a pneumoperitoneum established. The camera was then used to inspect the abdomen and there was no evidence of any intra-abdominal injuries or other abnormalities. The remaining abdominal ports were then placed. 8 mm robotic ports were placed in the right lower quadrant, left lower quadrant, and far left lateral abdominal wall. An 8 mm port was placed in the right upper quadrant and a 12 mm port was placed in the right lateral abdominal wall for laparoscopic assistance. All ports were placed under direct vision without difficulty. The surgical cart was then docked.   Utilizing the cautery scissors, the sigmoid colon was reflected medially after incising the peritoneum.  The left ureter was identified at the point where it crossed the iliac vessels and was dissected free from the surrounding retroperitoneal fat.  The dissection of the ureter was then carried down into the pelvis to its insertion into the bladder.  The ureter was then ligated with a 10 mm Weck clip distally on the ureter and bladder side.  A ureteral margin was sent for frozen section analysis.  The ureter was then placed up in the abdomen out of the pelvis after it was brought under the sigmoid colon mesentery to the  right side of the abdomen. Attention then turned to the right side of the abdomen and the peritoneum was incised allowing the colon and ileum  to be mobilized medially.  The right ureter was identified and isolated from the surrounding retroperitoneal fat in a similar fashion to the contralateral side. The right ureter was ligated with 10 mm Weck clips and a ureteral margin was sent for frozen section analysis. The right ureter was placed up into the abdomen.  The posterior peritoneum was then examined and the bladder was retracted anteriorly.  The posterior peritoneum between the bladder and rectum was opened connecting the peritoneal incisions near each ureteral stump.  The posterior plane between the rectum and bladder/prostate was developed helping to isolate the vascular pedicles of the bladder. The peritoneum was incised lateral to the bladder on either side and the bladder mobilized on each side down to the endopelvic fascia leaving the bladder suspended by the urachus. The vas deferens and medial umbilical ligaments were divided and the vascular pedicles of the bladder were prepared for ligation.  The vascular pedicles of the bladder were then ligated and divided with Weck clips.   The urachus was then divided and the bladder was fully mobilized anteriorly. The periprostatic fat was then removed from the prostate allowing full exposure of the endopelvic fascia. The endopelvic fascia was then incised from the apex back to the base of the prostate bilaterally and the underlying levator muscle fibers were swept laterally off the prostate thereby isolating the dorsal venous complex. The dorsal vein was then stapled and divided with a 45 mm Flex Eschelon stapler.  The prostatic vascular pedicles were then ligated with Weck clips and the neurovascular bundles were separated away from the prostate capsule.  The neurovascular bundles were released from the urethra bilaterally and the urethra was incised sharply anteriorly.  The catheter was then clipped with a 10 mm Weck clip and divided distally. The posterior urethra was sharply divided and the  bladder and prostate specimen was placed into an Endocatch II bag. A urethral margin was sent for frozen section analysis.  The pelvis was copiously irrigated.   Attention then turned to the right pelvic sidewall. The fibrofatty tissue extending from the genitofemoral nerve laterally to the aorta proximally to the hypogastric artery posteriorly and Cooper's ligament distally was dissected free from the pelvic sidewall with care to preserve the obturator nerve, genitofemoral nerve, and major vascular structures. Weck clips were used for lymphostasis and hemostasis. An identical procedure was then performed on the contralateral side and the lymphatic packets were removed for permanent pathologic analysis.  Hemostasis was ensured and the ureters were placed back into the pelvis. A 2-0 vicryl suture was placed between the urethra and Denonvillier's fascia to bring the urethra back into the pelvis.  At this point, the surgical cart was undocked and the 12 mm port site was closed with 0-vicryl sutures placed laparoscopically.    The patient was taken out of Trendelenburg and a lower midline incision was made and carried up to the umbilicus and extended to include the camera port incision. The specimens were removed.  A self-retaining Bookwalter retractor was placed.  Six 2-0 Monocryl sutures were placed in the urethra and clamped. The small bowel mesentery was brought out of the pelvis and appeared to reach the pelvis without difficulty indicating that an ileal orthotopic neobladder would be appropriate.  The terminal ileum was inspected in a 3-0 silk suture was used to mark a place approximately  15-20 cm from the ileocecal valve.  The mesentery was transilluminated and an incision was made in the mesentery taking care to avoid any major mesenteric vessels.  Smaller distal mesenteric vessels were ligated with the Ligasure.  Extending proximally, 60 cm of ileum was measured out with silk sutures placed 40 cm from  the distal mark and also 60 cm from the distal mark.  At the most proximal extent of this marked ileum, a short mesenteric window was created with electrocautery after transillumination of the mesentery.  Again the Ligasure was used to ligate any small distal mesenteric vessels.  The edge of the ileum was cleared in anticipation of division of the bowel.  The 75 mm Endo GIA stapler was then placed across the ileum proximally and distally and the ileum was stapled and divided.  This marked portion of the ileum was then placed caudally in preparation for creation of the ileal neobladder later.  The proximal and distal ends of the native ileum were then brought together.  Curved Mayo scissors were used to cut the edge of the staple portion of the bowel allowing the 75 mm Endo GIA stapler to be inserted and the bowel oriented along its antimesenteric border resulting in a stapled side-to-side anastomosis.  Babcock clamps were then placed on the remaining open portion of the bowel and it was stapled with another fire of the 75 mm Endo GIA stapler.  The bowel anastomosis appeared to be widely patent.  Additional 3-0 silk sutures were placed at the edges of the stapled bowel for further reinforcement.  A running 3-0 silk suture was used to close the mesentery of the small intestine.  Attention then turned to the portion of the ileum that had been isolated for creation of the orthotopic neobladder.   The distal staple edge of this portion of bowel was opened with electrocautery and a small opening at the proximal 40 cm mark was made.  Using a bulb syringe, this portion of ileum was irrigated with saline allowing the remaining succus to be irrigated into a bowl for removal from the operative field.  A 26 French catheter was then inserted through this opening in the ileum and extended distally and the ileum was opened on its antimesenteric border along the length of the distal 40 cm.  This portion of the bowel was folded  upon itself and sewn together with a 3-0 Vicryl suture.  This suture was then used to begin a running closure of the posterior neobladder serosa of the small intestine resulting in a U-shaped portion of bowel with distal 40 cm some back upon itself.  The proximal 20 cm chimney was then prepared for the ureteral anastomoses.  The left ureter had been brought through a window behind the sigmoid mesentery and brought over to the right lower quadrant.  The ureter was then secured to the proximal end of the chimney with a 4-0 Vicryl suture placed to the periureteral adventitial tissue.  A small plug of bowel was then removed near the site of the ureteral orifice.  The ureter was then opened and spatulated.  3 interrupted 4-0 Vicryl sutures were placed at the spatulated end of the ureter.  A Bander ureteral stent was then placed over a wire into the renal pelvis and the wire was removed.  A Fraser-tip sucker was then brought through the ileal chimney and out the opening at the anastomotic site where the stent was able to be final through the [place] tip sucker and  into the main body of the orthotopic neobladder.  The ureteral anastomosis was then completed with 4-0 Vicryl sutures run on either side of the anastomosis and then sewn together.  This appeared to be off tension and watertight.  An identical procedure was then performed for the contralateral right ureter that was placed into a corresponding portion of the ileal chimney and anastomosed in an identical fashion with a Bander ureteral stent placed in an identical fashion.  Attention then turned to the ileal neobladder.  The neobladder was folded upon itself and secured with a 3-0 Vicryl U stitch.  The caudal opening of the neobladder was then sewn with running 3-0 Vicryl sutures.  It was noted that the deep ended portion of the neobladder was the caudal most opening.  A 1 cm opening was left for the bladder neck in the 3-0 Vicryl running Stitch was used to  completely the closure of this portion of the neobladder otherwise.  3-0 Vicryl sutures were then used to evert the mucosa of the neobladder after neo-bladder neck site.  The preplaced urethral anastomotic sutures were then brought through their corresponding portions of the neobladder neck and the neobladder was sewn down to the urethra.  I then inserted a 24 Fr hematuria catheter in the urethra and was able to manipulate it into the neobladder without significant difficulty.  The Bander ureteral stents were then cut distally with 2-0 silk sutures placed through them and attached to the distal end of the urethral catheter.  The catheter balloon was then inflated and brought down into the bladder neck area.  The cranial portion of the neobladder was then closed with 3-0 Vicryl sutures placed through the serosa.  I then irrigated the neobladder with saline.  There was no evidence of an obvious anastomotic leak or leak of the neobladder. The neobladder was again irrigated and appeared to be watertight.  2 #19 Blake drains were then placed with one brought through one of the 8 mm robotic port sites and placed near the site of the ureteral anastomoses.  The other was brought to the left robotic port sites and placed near the urethral anastomosis.  These were placed to bulb suction and secured to the skin with 3-0 nylon sutures.  Attention then turned to closure of the fascia.  Prior to closure, the abdomen was inspected.  The bowel appeared viable and the bowel anastomosis appeared to be intact and viable.  The bowel was placed back in its normal anatomic position.   The fascia was then closed with 2 looped #1 PDS sutures.  The superficial wound was copiously irrigated.  The skin was reapproximated with 4-0 monocryl sutures at all incision sites. Sterile dressings were applied.    The patient appeared to tolerate the procedure well without complications.  He was able to be extubated and transferred to the recovery  unit in satisfactory condition.

## 2017-11-08 NOTE — Anesthesia Procedure Notes (Signed)
Procedure Name: Intubation Date/Time: 11/08/2017 7:31 AM Performed by: Sharlette Dense, CRNA Patient Re-evaluated:Patient Re-evaluated prior to induction Oxygen Delivery Method: Circle system utilized Preoxygenation: Pre-oxygenation with 100% oxygen Induction Type: IV induction Ventilation: Mask ventilation without difficulty and Oral airway inserted - appropriate to patient size Laryngoscope Size: Miller and 3 Grade View: Grade I Tube type: Oral Tube size: 8.0 mm Number of attempts: 1 Airway Equipment and Method: Stylet Placement Confirmation: ETT inserted through vocal cords under direct vision,  positive ETCO2 and breath sounds checked- equal and bilateral Secured at: 22 cm Tube secured with: Tape Dental Injury: Teeth and Oropharynx as per pre-operative assessment

## 2017-11-08 NOTE — Transfer of Care (Signed)
Immediate Anesthesia Transfer of Care Note  Patient: Rodney [name]  Procedure(s) Performed: ROBOT ASSISTED LAPAROSCOPIC RADICAL CYSTOPROSTATECTOMY BILATERAL PELVIC LYMPHADENECTOMY,ORTHOTOPIC NEOBLADDER (N/A )  Patient Location: PACU  Anesthesia Type:General  Level of Consciousness: awake  Airway & Oxygen Therapy: Patient Spontanous Breathing and Patient connected to face mask oxygen  Post-op Assessment: Report given to RN and Post -op Vital signs reviewed and stable  Post vital signs: Reviewed and stable  Last Vitals:  Vitals Value Taken Time  BP 136/101 11/08/2017  4:05 PM  Temp    Pulse 91 11/08/2017  4:08 PM  Resp 18 11/08/2017  4:08 PM  SpO2 100 % 11/08/2017  4:08 PM  Vitals shown include unvalidated device data.  Last Pain:  Vitals:   11/08/17 0553  TempSrc:   PainSc: 2       Patients Stated Pain Goal: 1 (85/27/78 2423)  Complications: No apparent anesthesia complications

## 2017-11-08 NOTE — Progress Notes (Signed)
HPI:  Rodney [name] was previously seen in the [place] [facility] due to a personal and family history of cancer and concerns regarding a hereditary predisposition to cancer. Please refer to our prior cancer genetics clinic note for more information regarding Rodney [name]'s medical, social and family histories, and our assessment and recommendations, at the time. Rodney [name] recent genetic test results were disclosed to him, as were recommendations warranted by these results. These results and recommendations are discussed in more detail below.  CANCER HISTORY:  Oncology History   Negative genetic testing for Lynch syndrome     Malignant neoplasm of prostate ([place])   09/11/2017 Initial Diagnosis    Malignant neoplasm of prostate ([place])    10/24/2017 Genetic Testing    Negative genetic testing on the Multicancer panel.  The Multi-Gene Panel offered by [facility] includes sequencing and/or deletion duplication testing of the following 84 genes: AIP, ALK, APC, ATM, AXIN2,BAP1,  BARD1, BLM, BMPR1A, BRCA1, BRCA2, BRIP1, CASR, CDC73, CDH1, CDK4, CDKN1B, CDKN1C, CDKN2A (p14ARF), CDKN2A (p16INK4a), CEBPA, CHEK2, CTNNA1, DICER1, DIS3L2, EGFR (c.2369C>T, p.Thr790Met variant only), EPCAM (Deletion/duplication testing only), FH, FLCN, GATA2, GPC3, GREM1 (Promoter region deletion/duplication testing only), HOXB13 (c.251G>A, p.Gly84Glu), HRAS, KIT, MAX, MEN1, MET, MITF (c.952G>A, p.Glu318Lys variant only), MLH1, MSH2, MSH3, MSH6, MUTYH, NBN, NF1, NF2, NTHL1, PALB2, PDGFRA, PHOX2B, PMS2, POLD1, POLE, POT1, PRKAR1A, PTCH1, PTEN, RAD50, RAD51C, RAD51D, RB1, RECQL4, RET, RUNX1, SDHAF2, SDHA (sequence changes only), SDHB, SDHC, SDHD, SMAD4, SMARCA4, SMARCB1, SMARCE1, STK11, SUFU, TERC, TERT, TMEM127, TP53, TSC1, TSC2, VHL, WRN and WT1.  The report date is 10/24/2017.     Urothelial cancer [facility])    Initial Diagnosis    Urothelial cancer ([place])    10/24/2017 Genetic Testing    Negative genetic testing on the  Multicancer panel.  The Multi-Gene Panel offered by [facility] includes sequencing and/or deletion duplication testing of the following 84 genes: AIP, ALK, APC, ATM, AXIN2,BAP1,  BARD1, BLM, BMPR1A, BRCA1, BRCA2, BRIP1, CASR, CDC73, CDH1, CDK4, CDKN1B, CDKN1C, CDKN2A (p14ARF), CDKN2A (p16INK4a), CEBPA, CHEK2, CTNNA1, DICER1, DIS3L2, EGFR (c.2369C>T, p.Thr790Met variant only), EPCAM (Deletion/duplication testing only), FH, FLCN, GATA2, GPC3, GREM1 (Promoter region deletion/duplication testing only), HOXB13 (c.251G>A, p.Gly84Glu), HRAS, KIT, MAX, MEN1, MET, MITF (c.952G>A, p.Glu318Lys variant only), MLH1, MSH2, MSH3, MSH6, MUTYH, NBN, NF1, NF2, NTHL1, PALB2, PDGFRA, PHOX2B, PMS2, POLD1, POLE, POT1, PRKAR1A, PTCH1, PTEN, RAD50, RAD51C, RAD51D, RB1, RECQL4, RET, RUNX1, SDHAF2, SDHA (sequence changes only), SDHB, SDHC, SDHD, SMAD4, SMARCA4, SMARCB1, SMARCE1, STK11, SUFU, TERC, TERT, TMEM127, TP53, TSC1, TSC2, VHL, WRN and WT1.  The report date is 10/24/2017.     FAMILY HISTORY:  We obtained a detailed, 4-generation family history.  Significant diagnoses are listed below: Family History  Problem Relation Age of Onset  . Heart attack Father   . Hypertension Father   . Diabetes Father   . Heart failure Father   . Hypertension Mother   . Gastric cancer Sister 24       d. 62  . Prostate cancer Brother 28  . Melanoma Maternal Aunt   . Stroke Maternal Uncle   . Stroke Paternal Uncle   . Melanoma Maternal Grandmother        dx under 90  . Colon cancer Maternal Grandmother   . Heart disease Maternal Grandfather   . Heart attack Paternal Grandfather     The patient ha two daughters who are cancer free.  He has two sisters, three full brothers and a maternal half brother.  One full brother was diagnosed with  prostate cancer in May or June of this year.  One sister was diagnosed with stomach cancer at 38 and died at 58.  Both parents are deceased.  The patient's mother died at 25.  She had a brother and  sister.  Her brother died of a stroke and her sister developed melanoma.  Both maternal grandparents are deceased.  The grandfather had melanoma under age 68 and colon cancer at 57.  The grandmother died of heart disease.  The patient's father died of a heart attack at 72.  He had one sister who had a stroke.  Both paternal grandparents are deceased.  The grandfather died of a heart attack at a young age.  Rodney [name] is unaware of previous family history of genetic testing for hereditary cancer risks. Patient's maternal ancestors are of Caucasian descent, and paternal ancestors are of Caucasian descent. There is no reported Ashkenazi Jewish ancestry. There is no known consanguinity.  GENETIC TEST RESULTS: Genetic testing reported out on October 24, 2017 through the multi-cancer panel found no deleterious mutations.  The Multi-Gene Panel offered by [facility] includes sequencing and/or deletion duplication testing of the following 84 genes: AIP, ALK, APC, ATM, AXIN2,BAP1,  BARD1, BLM, BMPR1A, BRCA1, BRCA2, BRIP1, CASR, CDC73, CDH1, CDK4, CDKN1B, CDKN1C, CDKN2A (p14ARF), CDKN2A (p16INK4a), CEBPA, CHEK2, CTNNA1, DICER1, DIS3L2, EGFR (c.2369C>T, p.Thr790Met variant only), EPCAM (Deletion/duplication testing only), FH, FLCN, GATA2, GPC3, GREM1 (Promoter region deletion/duplication testing only), HOXB13 (c.251G>A, p.Gly84Glu), HRAS, KIT, MAX, MEN1, MET, MITF (c.952G>A, p.Glu318Lys variant only), MLH1, MSH2, MSH3, MSH6, MUTYH, NBN, NF1, NF2, NTHL1, PALB2, PDGFRA, PHOX2B, PMS2, POLD1, POLE, POT1, PRKAR1A, PTCH1, PTEN, RAD50, RAD51C, RAD51D, RB1, RECQL4, RET, RUNX1, SDHAF2, SDHA (sequence changes only), SDHB, SDHC, SDHD, SMAD4, SMARCA4, SMARCB1, SMARCE1, STK11, SUFU, TERC, TERT, TMEM127, TP53, TSC1, TSC2, VHL, WRN and WT1.   The test report has been scanned into EPIC and is located under the Molecular Pathology section of the Results Review tab.    We discussed with Rodney [name] that since the current genetic testing is  not perfect, it is possible there may be a gene mutation in one of these genes that current testing cannot detect, but that chance is small.  We also discussed, that it is possible that another gene that has not yet been discovered, or that we have not yet tested, is responsible for the cancer diagnoses in the family, and it is, therefore, important to remain in touch with cancer genetics in the future so that we can continue to offer Rodney [name] the most up to date genetic testing.   CANCER SCREENING RECOMMENDATIONS:  This result is reassuring and indicates that Rodney [name] likely does not have an increased risk for a future cancer due to a mutation in one of these genes. This normal test also suggests that Rodney [name]'s cancer was most likely not due to an inherited predisposition associated with one of these genes.  Most cancers happen by chance and this negative test suggests that his cancer falls into this category.  We, therefore, recommended he continue to follow the cancer management and screening guidelines provided by his oncology and primary healthcare provider.   An individual's cancer risk and medical management are not determined by genetic test results alone. Overall cancer risk assessment incorporates additional factors, including personal medical history, family history, and any available genetic information that may result in a personalized plan for cancer prevention and surveillance.  RECOMMENDATIONS FOR FAMILY MEMBERS:  Women in this family might be at some increased risk of  developing cancer, over the general population risk, simply due to the family history of cancer.  We recommended women in this family have a yearly mammogram beginning at age 57, or 86 years younger than the earliest onset of cancer, an annual clinical breast exam, and perform monthly breast self-exams. Women in this family should also have a gynecological exam as recommended by their primary provider. All family members  should have a colonoscopy by age 8.  FOLLOW-UP: Lastly, we discussed with Rodney [name] that cancer genetics is a rapidly advancing field and it is possible that new genetic tests will be appropriate for him and/or his family members in the future. We encouraged him to remain in contact with cancer genetics on an annual basis so we can update his personal and family histories and let him know of advances in cancer genetics that may benefit this family.   Our contact number was provided. Rodney [name] questions were answered to his satisfaction, and he knows he is welcome to call us at anytime with additional questions or concerns.   Roma Kayser, MS, [facility] Certified Genetic Counselor [email or web address]

## 2017-11-09 LAB — BASIC METABOLIC PANEL
Anion gap: 7 (ref 5–15)
BUN: 21 mg/dL — AB (ref 6–20)
CO2: 25 mmol/L (ref 22–32)
Calcium: 8.3 mg/dL — ABNORMAL LOW (ref 8.9–10.3)
Chloride: 106 mmol/L (ref 98–111)
Creatinine, Ser: 1.44 mg/dL — ABNORMAL HIGH (ref 0.61–1.24)
GFR calc Af Amer: >60 mL/min (ref >60)
GFR calc non Af Amer: 56 mL/min — ABNORMAL LOW (ref >60)
Glucose, Bld: 133 mg/dL — ABNORMAL HIGH (ref 70–99)
POTASSIUM: 4.7 mmol/L (ref 3.5–5.1)
SODIUM: 138 mmol/L (ref 135–145)

## 2017-11-09 LAB — HEMOGLOBIN AND HEMATOCRIT, BLOOD
HCT: 36.4 % — ABNORMAL LOW (ref 39.0–52.0)
HEMOGLOBIN: 11.9 g/dL — AB (ref 13.0–17.0)

## 2017-11-09 MED ORDER — METOCLOPRAMIDE HCL 5 MG/ML IJ SOLN
5.0000 mg | Freq: Three times a day (TID) | INTRAMUSCULAR | Status: DC
  Administered 2017-11-11 – 2017-11-12 (×3): 5 mg via INTRAVENOUS
  Filled 2017-11-09 (×4): qty 2

## 2017-11-09 MED ORDER — BISACODYL 10 MG RE SUPP
10.0000 mg | Freq: Once | RECTAL | Status: AC
  Administered 2017-11-09: 10 mg via RECTAL
  Filled 2017-11-09: qty 1

## 2017-11-09 NOTE — Progress Notes (Signed)
Patient ID: Rodney [name], male   DOB: 04-24-1970, 47 y.o.   MRN: [id number]  Pt doing well.  Still has not ambulated.  Feeling bloated.  Encouraged ambulation.  He will have sips of liquids and ice chips for now and most of tomorrow and will have diet progressed over the weekend as he is able to tolerate.

## 2017-11-09 NOTE — Progress Notes (Signed)
Patient continues to that state that he is "unable to get out of bed at this time due to pain." MD aware of increased pain this AM and orders received to continue current plan due to high risk of ileus. Ice offered to patient for surgical site, patient declined.  Patient compliant with using insetive spirometer. Will continue to monitor.

## 2017-11-09 NOTE — Progress Notes (Signed)
Patient ID: Rodney [name], male   DOB: [birth year], 47 y.o.   MRN: [id number]  . 1 Day Post-Op Subjective: Pt doing well.  No nausea or vomiting.  No flatus.  Pain controlled.  Has not been out of bed yet.  Objective: Vital signs in last 24 hours: Temp:  [98 F (36.7 C)-100.5 F (38.1 C)] 98 F (36.7 C) (10/25 0400) Pulse Rate:  [56-99] 72 (10/25 0600) Resp:  [9-19] 11 (10/25 0600) BP: (96-146)/(55-102) 112/69 (10/25 0600) SpO2:  [96 %-100 %] 96 % (10/25 0600)  Intake/Output from previous day: 10/24 0701 - 10/25 0700 In: 5902.2 [I.V.:5304.1; IV Piggyback:598.1] Out: 2025 [TZGYF:7494; Drains:300; Blood:350] Intake/Output this shift: No intake/output data recorded.  Physical Exam:  General: Alert and oriented CV: RRR Lungs: Clear Abdomen: Soft, ND, positive BS Incisions: C/D/I GU: Catheter with grossly clear urine Ext: NT, No erythema  Lab Results: Recent Labs    11/08/17 1619 11/09/17 0328  HGB 12.8* 11.9*  HCT 38.9* 36.4*   BMET Recent Labs    11/08/17 1619 11/09/17 0328  NA 138 138  K 5.0 4.7  CL 106 106  CO2 23 25  GLUCOSE 173* 133*  BUN 20 21*  CREATININE 1.72* 1.44*  CALCIUM 8.2* 8.3*     Studies/Results: No results found.  Assessment/Plan: POD # 1 s/p radical cystoprostatectomy/BPLND/orthotopic neobladder - Ambulate, IS, DVT prophylaxis - Will consider sips and chips this afternoon if doing well this morning - Will start irrigating catheter later today - Transfer to floor - IV pain control as needed for now with minimization of narcotics - Alvimopan   LOS: 1 day   Jame Seelig,LES 11/09/2017, 7:48 AM

## 2017-11-10 ENCOUNTER — Inpatient Hospital Stay (HOSPITAL_COMMUNITY): Payer: PRIVATE HEALTH INSURANCE

## 2017-11-10 LAB — BASIC METABOLIC PANEL
ANION GAP: 3 — AB (ref 5–15)
BUN: 21 mg/dL — ABNORMAL HIGH (ref 6–20)
CALCIUM: 8.4 mg/dL — AB (ref 8.9–10.3)
CO2: 24 mmol/L (ref 22–32)
Chloride: 108 mmol/L (ref 98–111)
Creatinine, Ser: 1.23 mg/dL (ref 0.61–1.24)
GFR calc Af Amer: >60 mL/min (ref >60)
Glucose, Bld: 131 mg/dL — ABNORMAL HIGH (ref 70–99)
Potassium: 5.1 mmol/L (ref 3.5–5.1)
SODIUM: 135 mmol/L (ref 135–145)

## 2017-11-10 LAB — HEMOGLOBIN AND HEMATOCRIT, BLOOD
HCT: 32.9 % — ABNORMAL LOW (ref 39.0–52.0)
HEMOGLOBIN: 10.6 g/dL — AB (ref 13.0–17.0)

## 2017-11-10 MED ORDER — ACETAMINOPHEN 10 MG/ML IV SOLN
1000.0000 mg | Freq: Four times a day (QID) | INTRAVENOUS | Status: AC
  Administered 2017-11-10 (×4): 1000 mg via INTRAVENOUS
  Filled 2017-11-10 (×4): qty 100

## 2017-11-10 NOTE — Progress Notes (Signed)
2 Days Post-Op  Subjective: He had some lower abdominal pain and distention in the night.   A KUB is consistent with an ileus.  He has minimal flatus.  He is better this morning.  He has had no nausea or vomiting.  His urine is slightly bloody with good output.  Drain output is declining.   Hgb is down slightly at 10.6.  Cr is 1.23.   ROS:  Review of Systems  Gastrointestinal: Positive for abdominal pain. Negative for nausea and vomiting.  All other systems reviewed and are negative.   Anti-infectives: Anti-infectives (From admission, onward)   Start     Dose/Rate Route Frequency Ordered Stop   11/08/17 2200  cefOXitin (MEFOXIN) 1 g in sodium chloride 0.9 % 100 mL IVPB     1 g 200 mL/hr over 30 Minutes Intravenous Every 8 hours 11/08/17 1740 11/09/17 0543   11/08/17 1303  sodium chloride 0.9 % with cefoTEtan (CEFOTAN) ADS Med    Note to Pharmacy:  Danley Danker   : cabinet override      11/08/17 1303 11/08/17 1317   11/08/17 0600  cefoTEtan (CEFOTAN) 2 g in sodium chloride 0.9 % 100 mL IVPB     2 g 200 mL/hr over 30 Minutes Intravenous On call to O.R. 11/08/17 0537 11/09/17 1850      Current Facility-Administered Medications  Medication Dose Route Frequency Provider Last Rate Last Dose  . acetaminophen (OFIRMEV) IV 1,000 mg  1,000 mg Intravenous Q6H Irine Seal, MD 400 mL/hr at 11/10/17 0615 1,000 mg at 11/10/17 0615  . alvimopan (ENTEREG) capsule 12 mg  12 mg Oral BID Raynelle Bring, MD   12 mg at 11/09/17 2147  . dextrose 5 % and 0.45 % NaCl with KCl 20 mEq/L infusion   Intravenous Continuous Raynelle Bring, MD 125 mL/hr at 11/10/17 0600    . diphenhydrAMINE (BENADRYL) injection 12.5-25 mg  12.5-25 mg Intravenous Q6H PRN Raynelle Bring, MD       Or  . diphenhydrAMINE (BENADRYL) 12.5 MG/5ML elixir 12.5-25 mg  12.5-25 mg Oral Q6H PRN Raynelle Bring, MD      . enoxaparin (LOVENOX) injection 40 mg  40 mg Subcutaneous Q24H Raynelle Bring, MD   40 mg at 11/10/17 0802  . HYDROmorphone  (DILAUDID) injection 0.5-1 mg  0.5-1 mg Intravenous Q2H PRN Raynelle Bring, MD   1 mg at 11/10/17 0803  . metoCLOPramide (REGLAN) injection 5 mg  5 mg Intravenous Queen Blossom, MD      . ondansetron [facility]) injection 4 mg  4 mg Intravenous Q4H PRN Raynelle Bring, MD      . zolpidem (AMBIEN) tablet 5 mg  5 mg Oral QHS PRN,MR X 1 Raynelle Bring, MD         Objective: Vital signs in last 24 hours: Temp:  [97.8 F (36.6 C)-99 F (37.2 C)] 97.8 F (36.6 C) (10/26 2440) Pulse Rate:  [71-91] 79 (10/26 0613) Resp:  [16-20] 18 (10/26 1027) BP: (105-130)/(61-84) 114/61 (10/26 2536) SpO2:  [95 %-98 %] 97 % (10/26 6440)  Intake/Output from previous day: 10/25 0701 - 10/26 0700 In: 2458.9 [P.O.:50; I.V.:2114.8; IV Piggyback:294.1] Out: 1871 [HKVQQ:5956; Drains:140; Stool:1] Intake/Output this shift: No intake/output data recorded.   Physical Exam  Constitutional: He is oriented to person, place, and time. He appears well-developed and well-nourished.  Cardiovascular: Normal rate, regular rhythm and normal heart sounds.  Pulmonary/Chest: Effort normal and breath sounds normal. No respiratory distress.  Abdominal: Soft. Bowel sounds are normal. He exhibits  distension. There is tenderness (mild).  Incisions intact without erythema.   Musculoskeletal: Normal range of motion. He exhibits no edema or tenderness.  Neurological: He is alert and oriented to person, place, and time.  Skin: Skin is warm and dry.  Psychiatric: He has a normal mood and affect.  Vitals reviewed.   Lab Results:  Recent Labs    11/09/17 0328 11/10/17 0511  HGB 11.9* 10.6*  HCT 36.4* 32.9*   BMET Recent Labs    11/09/17 0328 11/10/17 0511  NA 138 135  K 4.7 5.1  CL 106 108  CO2 25 24  GLUCOSE 133* 131*  BUN 21* 21*  CREATININE 1.44* 1.23  CALCIUM 8.3* 8.4*   PT/INR No results for input(s): LABPROT, INR in the last 72 hours. ABG No results for input(s): PHART, HCO3 in the last 72  hours.  Invalid input(s): PCO2, PO2  Studies/Results: Dg Abd 1 View  Result Date: 11/10/2017 CLINICAL DATA:  Postop ileus. EXAM: ABDOMEN - 1 VIEW COMPARISON:  None. FINDINGS: Bilateral ureteral stents are noted. Surgical drains are identified in the pelvis. Air-filled mildly dilated loops of small bowel. There is air in the colon extending to the rectum. No other acute abnormalities. IMPRESSION: 1. Support apparatus as above. 2. Probable ileus given history. Small bowel obstruction could not be excluded on this single study but is probably less likely given history. Recommend clinical correlation and attention on follow-up. Electronically Signed   By: Dorise Bullion III M.D   On: 11/10/2017 07:29     Assessment and Plan: POD#2 from radical cystoprostatectomy with BPLND and neobladder.       He has a post op ileus and will continue only on sips and chips for now.  Encouraged to ambulate.      LOS: 2 days    Irine Seal 11/10/2017 [phone] ID: Rodney [name], male   DOB: 13-Aug-1970, 47 y.o.   MRN: [id number]

## 2017-11-11 ENCOUNTER — Inpatient Hospital Stay (HOSPITAL_COMMUNITY): Payer: PRIVATE HEALTH INSURANCE

## 2017-11-11 MED ORDER — HYDROMORPHONE HCL 1 MG/ML IJ SOLN
0.5000 mg | Freq: Once | INTRAMUSCULAR | Status: AC
  Administered 2017-11-11: 0.5 mg via INTRAVENOUS

## 2017-11-11 MED ORDER — BISACODYL 10 MG RE SUPP
10.0000 mg | Freq: Once | RECTAL | Status: AC
  Administered 2017-11-11: 10 mg via RECTAL
  Filled 2017-11-11: qty 1

## 2017-11-11 MED ORDER — ACETAMINOPHEN 500 MG PO TABS
500.0000 mg | ORAL_TABLET | Freq: Four times a day (QID) | ORAL | Status: DC
  Administered 2017-11-11 – 2017-11-14 (×14): 500 mg via ORAL
  Filled 2017-11-11 (×16): qty 1

## 2017-11-11 MED ORDER — KETOROLAC TROMETHAMINE 15 MG/ML IJ SOLN
15.0000 mg | Freq: Four times a day (QID) | INTRAMUSCULAR | Status: AC
  Administered 2017-11-11 – 2017-11-12 (×8): 15 mg via INTRAVENOUS
  Filled 2017-11-11 (×8): qty 1

## 2017-11-11 NOTE — Progress Notes (Signed)
Patient still c/o 9/10 pain despite medications given. Dr. Alinda Money paged again. New orders received. Will continue to monitor.

## 2017-11-11 NOTE — Progress Notes (Signed)
3 Days Post-Op  Subjective: He continues to have some abdominal discomfort and distention but has started passing gas.  He has taken very little po sips and chips.   He has no other complaints.  JP drainage continues to decline and UOP is good.  ROS:  Review of Systems  Constitutional: Negative for chills and fever.  Respiratory: Negative for shortness of breath.   Cardiovascular: Negative for chest pain and leg swelling.  Gastrointestinal: Negative for nausea and vomiting.    Anti-infectives: Anti-infectives (From admission, onward)   Start     Dose/Rate Route Frequency Ordered Stop   11/08/17 2200  cefOXitin (MEFOXIN) 1 g in sodium chloride 0.9 % 100 mL IVPB     1 g 200 mL/hr over 30 Minutes Intravenous Every 8 hours 11/08/17 1740 11/09/17 0543   11/08/17 1303  sodium chloride 0.9 % with cefoTEtan (CEFOTAN) ADS Med    Note to Pharmacy:  Danley Danker   : cabinet override      11/08/17 1303 11/08/17 1317   11/08/17 0600  cefoTEtan (CEFOTAN) 2 g in sodium chloride 0.9 % 100 mL IVPB     2 g 200 mL/hr over 30 Minutes Intravenous On call to O.R. 11/08/17 0537 11/09/17 1850      Current Facility-Administered Medications  Medication Dose Route Frequency Provider Last Rate Last Dose  . acetaminophen (TYLENOL) tablet 500 mg  500 mg Oral Q6H Raynelle Bring, MD   500 mg at 11/11/17 0801  . alvimopan (ENTEREG) capsule 12 mg  12 mg Oral BID Raynelle Bring, MD   12 mg at 11/11/17 0858  . dextrose 5 % and 0.45 % NaCl with KCl 20 mEq/L infusion   Intravenous Continuous Raynelle Bring, MD 125 mL/hr at 11/11/17 0900    . diphenhydrAMINE (BENADRYL) injection 12.5-25 mg  12.5-25 mg Intravenous Q6H PRN Raynelle Bring, MD       Or  . diphenhydrAMINE (BENADRYL) 12.5 MG/5ML elixir 12.5-25 mg  12.5-25 mg Oral Q6H PRN Raynelle Bring, MD      . enoxaparin (LOVENOX) injection 40 mg  40 mg Subcutaneous Q24H Raynelle Bring, MD   40 mg at 11/11/17 0802  . HYDROmorphone (DILAUDID) injection 0.5-1 mg  0.5-1 mg  Intravenous Q2H PRN Raynelle Bring, MD   1 mg at 11/11/17 0858  . ketorolac (TORADOL) 15 MG/ML injection 15 mg  15 mg Intravenous Q6H Raynelle Bring, MD   15 mg at 11/11/17 0801  . metoCLOPramide (REGLAN) injection 5 mg  5 mg Intravenous Queen Blossom, MD      . ondansetron [facility]) injection 4 mg  4 mg Intravenous Q4H PRN Raynelle Bring, MD      . zolpidem (AMBIEN) tablet 5 mg  5 mg Oral QHS PRN,MR X 1 Raynelle Bring, MD         Objective: Vital signs in last 24 hours: Temp:  [98.3 F (36.8 C)-98.8 F (37.1 C)] 98.7 F (37.1 C) (10/27 0535) Pulse Rate:  [82-85] 82 (10/27 0535) Resp:  [18-20] 20 (10/27 0535) BP: (123-136)/(78-86) 136/79 (10/27 0535) SpO2:  [96 %-98 %] 97 % (10/27 0535)  Intake/Output from previous day: 10/26 0701 - 10/27 0700 In: 2534.1 [P.O.:50; I.V.:2343.8; IV Piggyback:140.3] Out: 1900 [Urine:1765; Drains:135] Intake/Output this shift: Total I/O In: -  Out: 55 [Drains:55]   Physical Exam  Constitutional: He is oriented to person, place, and time. He appears well-developed and well-nourished. No distress.  Cardiovascular: Normal rate, regular rhythm and normal heart sounds.  Pulmonary/Chest: Effort normal and breath  sounds normal. No respiratory distress.  Abdominal: Soft. Bowel sounds are normal. He exhibits distension.  Distention is improved.  Incisions are intact.   Genitourinary:  Genitourinary Comments: Mild genital edema.   Musculoskeletal: Normal range of motion. He exhibits no edema or deformity.  Neurological: He is alert and oriented to person, place, and time.  Skin: Skin is warm and dry.  Psychiatric: He has a normal mood and affect.  Vitals reviewed.   Lab Results:  Recent Labs    11/09/17 0328 11/10/17 0511  HGB 11.9* 10.6*  HCT 36.4* 32.9*   BMET Recent Labs    11/09/17 0328 11/10/17 0511  NA 138 135  K 4.7 5.1  CL 106 108  CO2 25 24  GLUCOSE 133* 131*  BUN 21* 21*  CREATININE 1.44* 1.23  CALCIUM 8.3* 8.4*    PT/INR No results for input(s): LABPROT, INR in the last 72 hours. ABG No results for input(s): PHART, HCO3 in the last 72 hours.  Invalid input(s): PCO2, PO2  Studies/Results: Dg Abd 1 View  Result Date: 11/10/2017 CLINICAL DATA:  Postop ileus. EXAM: ABDOMEN - 1 VIEW COMPARISON:  None. FINDINGS: Bilateral ureteral stents are noted. Surgical drains are identified in the pelvis. Air-filled mildly dilated loops of small bowel. There is air in the colon extending to the rectum. No other acute abnormalities. IMPRESSION: 1. Support apparatus as above. 2. Probable ileus given history. Small bowel obstruction could not be excluded on this single study but is probably less likely given history. Recommend clinical correlation and attention on follow-up. Electronically Signed   By: Dorise Bullion III M.D   On: 11/10/2017 07:29     Assessment and Plan: POD#3 from radical cystoprostatectomy with BPLND and neobladder.   He is doing well and has started passing flatus.  I will order a clear liquid diet but he is going to take it slow.       LOS: 3 days    Irine Seal 11/11/2017 [phone] ID: Kavin Leech, male   DOB: [birth year], 47 y.o.   MRN: [id number]

## 2017-11-11 NOTE — Progress Notes (Signed)
Patient is complaining of severe pain 9/10. Dr. Alinda Money paged. Verbal order for 1 time dose of 0.5mg  dilaudid IV and dulcolax suppository. Will continue to monitor patient closely.

## 2017-11-11 NOTE — Progress Notes (Signed)
Patient is feeling slight relief and rating pain at an 8/10 now. Abdominal xray performed and per Dr. Alinda Money, place NG tube. Dr. Alinda Money gave verbal order to hold off on NG tube if patient desires to wait and that he will re-assess during his AM rounds.Educated patient on reason behind needing NG tube and what it is used for. Patient states "I think I'll hold off for now." Will continue to monitor patient closely.

## 2017-11-12 ENCOUNTER — Inpatient Hospital Stay (HOSPITAL_COMMUNITY): Payer: PRIVATE HEALTH INSURANCE

## 2017-11-12 LAB — BASIC METABOLIC PANEL
Anion gap: 4 — ABNORMAL LOW (ref 5–15)
BUN: 15 mg/dL (ref 6–20)
CALCIUM: 8.5 mg/dL — AB (ref 8.9–10.3)
CO2: 22 mmol/L (ref 22–32)
CREATININE: 1.06 mg/dL (ref 0.61–1.24)
Chloride: 110 mmol/L (ref 98–111)
GFR calc non Af Amer: >60 mL/min (ref >60)
Glucose, Bld: 127 mg/dL — ABNORMAL HIGH (ref 70–99)
Potassium: 5 mmol/L (ref 3.5–5.1)
Sodium: 136 mmol/L (ref 135–145)

## 2017-11-12 LAB — CREATININE, FLUID (PLEURAL, PERITONEAL, JP DRAINAGE): Creat, Fluid: 1.1 mg/dL

## 2017-11-12 NOTE — Progress Notes (Signed)
Patient ID: Rodney [name], male   DOB: 1970/11/08, 47 y.o.   MRN: [id number]  4 Days Post-Op Subjective: Pt did ok over the weekend. Ambulating better (walked 6 times yesterday) and had flatus and a couple of bowel movements.  Was advanced to clears yesterday but admittedly did not take hardly anything by mouth.  He then developed severe upper abdominal pain last evening.  Abdominal x-ray indicated findings consistent with ileus and he also had relief after passing flatus and a small BM after a suppository.  However, he also had his catheter irrigated at that time and it began draining better with relief of his pain as well.  He states nursing staff have been irrigating catheter about once per day. He ultimately did not require NG tube placement and feels much improved this morning.  He did have a BM and flatus this morning.  Objective: Vital signs in last 24 hours: Temp:  [98 F (36.7 C)-98.2 F (36.8 C)] 98.2 F (36.8 C) (10/28 0430) Pulse Rate:  [81-87] 87 (10/28 0430) Resp:  [18-20] 20 (10/28 0430) BP: (118-134)/(77-87) 118/77 (10/28 0430) SpO2:  [97 %-99 %] 97 % (10/28 0430)  Intake/Output from previous day: 10/27 0701 - 10/28 0700 In: 3210 [P.O.:180; I.V.:3000] Out: 1555 [Urine:1315; Drains:240] Intake/Output this shift: No intake/output data recorded.  Physical Exam:  General: Alert and oriented CV: RRR Lungs: Clear Abdomen: Mild distention, hypoactive BS, NT Incisions: C/D/I, bilateral drains with serosanguineous drainage  Ext: NT, No erythema  Lab Results: Recent Labs    11/10/17 0511  HGB 10.6*  HCT 32.9*   BMET Recent Labs    11/10/17 0511 11/12/17 0549  NA 135 136  K 5.1 5.0  CL 108 110  CO2 24 22  GLUCOSE 131* 127*  BUN 21* 15  CREATININE 1.23 1.06  CALCIUM 8.4* 8.5*     Studies/Results: Dg Abd 1 View  Result Date: 11/11/2017 CLINICAL DATA:  47 y/o M; abdominal pain post surgery for prostate and bladder cancer. EXAM: ABDOMEN - 1 VIEW COMPARISON:   11/10/2017 abdomen radiographs. FINDINGS: Bilateral ureteral stents and multiple pelvic drains are stable. Stable dilatation of small bowel predominantly in left hemiabdomen. Bones are unremarkable. IMPRESSION: 1. Stable ureteral stents and pelvic drains. 2. Stable small bowel dilatation probably representing ileus, possibly small-bowel obstruction. Electronically Signed   By: Kristine Garbe M.D.   On: 11/11/2017 23:59    Assessment/Plan: POD # 4 s/p radical cystoprostatectomy, BPLND and ileal neobladder - Pathology pending - Exam improved this morning.  Will obtain formal AAS for further evaluation and keep NPO until this is reviewed.  However, pain last evening may have been due to urethral catheter obstruction since he had significant relief after irrigation.  Will make sure catheter is being irrigated every 8 hours.  After review of abdominal films and if exam is still improved later today, will start clear liquids back.  - Ambulate, IS, DVT prophylaxis (will begin Lovenox injection teaching) - Continue IVF for now   LOS: 4 days   Sylvia Helms,LES 11/12/2017, 7:55 AM

## 2017-11-13 ENCOUNTER — Encounter: Payer: Self-pay | Admitting: Medical Oncology

## 2017-11-13 LAB — CREATININE, FLUID (PLEURAL, PERITONEAL, JP DRAINAGE): CREAT FL: 0.9 mg/dL

## 2017-11-13 MED ORDER — TRAMADOL HCL 50 MG PO TABS: 50.0000 mg | ORAL_TABLET | Freq: Four times a day (QID) | ORAL | 0 refills | Status: DC | PRN

## 2017-11-13 MED ORDER — ENOXAPARIN SODIUM 40 MG/0.4ML ~~LOC~~ SOLN: 40.0000 mg | SUBCUTANEOUS | 0 refills | Status: DC

## 2017-11-13 MED ORDER — ENSURE ENLIVE PO LIQD
237.0000 mL | Freq: Three times a day (TID) | ORAL | Status: DC
  Administered 2017-11-13: 237 mL via ORAL

## 2017-11-13 MED ORDER — TRAMADOL HCL 50 MG PO TABS
50.0000 mg | ORAL_TABLET | Freq: Four times a day (QID) | ORAL | Status: DC | PRN
  Administered 2017-11-13 – 2017-11-14 (×2): 50 mg via ORAL
  Filled 2017-11-13 (×2): qty 1

## 2017-11-13 MED ORDER — DOCUSATE SODIUM 100 MG PO CAPS
100.0000 mg | ORAL_CAPSULE | Freq: Two times a day (BID) | ORAL | Status: DC
  Filled 2017-11-13: qty 1

## 2017-11-13 MED ORDER — KETOROLAC TROMETHAMINE 15 MG/ML IJ SOLN
15.0000 mg | Freq: Four times a day (QID) | INTRAMUSCULAR | Status: DC
  Administered 2017-11-13 – 2017-11-14 (×5): 15 mg via INTRAVENOUS
  Filled 2017-11-13 (×5): qty 1

## 2017-11-13 NOTE — Progress Notes (Signed)
Patient ID: Rodney [name], male   DOB: 19-Sep-1970, 47 y.o.   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.  His prognosis is excellent.

## 2017-11-13 NOTE — Progress Notes (Signed)
Patient ID: Rodney [name], male   DOB: 04-14-1970, 47 y.o.   MRN: [id number]  5 Days Post-Op Subjective: Pt feeling much better.  AAS yesterday consistent with ileus but he has progressively had increased flatus and multiple bowel movements.  No more severe abdominal pain complaints since catheter draining better and with frequent irrigation of mucus.  Tolerated clear liquid diet yesterday.  R drain Cr 1.1 - removed.  Objective: Vital signs in last 24 hours: Temp:  [98.1 F (36.7 C)-98.5 F (36.9 C)] 98.5 F (36.9 C) (10/29 0508) Pulse Rate:  [70-87] 70 (10/29 0508) Resp:  [18] 18 (10/29 0508) BP: (117-134)/(76-87) 123/84 (10/29 0508) SpO2:  [97 %-99 %] 97 % (10/29 0508)  Intake/Output from previous day: 10/28 0701 - 10/29 0700 In: 3224.7 [P.O.:60; I.V.:2994.7] Out: 2320 [Urine:2150; Drains:170] Intake/Output this shift: No intake/output data recorded.  Physical Exam:  General: Alert and oriented CV: RRR Lungs: Clear Abdomen: Soft, ND, minimal BS Incisions: C/D/I GU: Urine draining well with clear urine. Ext: NT, No erythema  Lab Results: No results for input(s): HGB, HCT in the last 72 hours. BMET Recent Labs    11/12/17 0549  NA 136  K 5.0  CL 110  CO2 22  GLUCOSE 127*  BUN 15  CREATININE 1.06  CALCIUM 8.5*     Studies/Results:   Assessment/Plan: POD #5 s/p radical cystoprostatectomy/BPLND and orthotopic ileal neobladder - Ambulate, IS - Continue Lovenox and teaching for patient to continue at home - Advance diet - Decrease IVF - Continue catheter irrigation and teaching for patient - Pathology pending - Anticipate possible discharge tomorrow if injection  - Check JP Cr on left   LOS: 5 days   Leeum Sankey,LES 11/13/2017, 7:38 AM

## 2017-11-14 LAB — BASIC METABOLIC PANEL
Anion gap: 3 — ABNORMAL LOW (ref 5–15)
BUN: 14 mg/dL (ref 6–20)
CHLORIDE: 109 mmol/L (ref 98–111)
CO2: 23 mmol/L (ref 22–32)
Calcium: 8.8 mg/dL — ABNORMAL LOW (ref 8.9–10.3)
Creatinine, Ser: 0.93 mg/dL (ref 0.61–1.24)
Glucose, Bld: 94 mg/dL (ref 70–99)
POTASSIUM: 4.1 mmol/L (ref 3.5–5.1)
SODIUM: 135 mmol/L (ref 135–145)

## 2017-11-14 NOTE — Progress Notes (Signed)
Patient ID: Rodney [name], male   DOB: 21-Jan-1970, 47 y.o.   MRN: [id number]  6 Days Post-Op Subjective: He has continued to do well.  Now tolerating regular diet.  Passing flatus and having bowel movements.  Both drains now removed (consistent with serum).  Objective: Vital signs in last 24 hours: Temp:  [97.9 F (36.6 C)-98.7 F (37.1 C)] 97.9 F (36.6 C) (10/30 0543) Pulse Rate:  [71-85] 71 (10/30 0543) Resp:  [16-18] 16 (10/30 0543) BP: (119-132)/(79-83) 132/83 (10/30 0543) SpO2:  [98 %-99 %] 98 % (10/30 0543)  Intake/Output from previous day: 10/29 0701 - 10/30 0700 In: 100 [P.O.:50] Out: 2730 [Urine:2600; Drains:130] Intake/Output this shift: No intake/output data recorded.  Physical Exam:  General: Alert and oriented Abdomen: Soft, ND, positive BS Incisions: C/D/I Ext: NT, No erythema  Lab Results:  BMET Recent Labs    11/12/17 0549 11/14/17 0534  NA 136 135  K 5.0 4.1  CL 110 109  CO2 22 23  GLUCOSE 127* 94  BUN 15 14  CREATININE 1.06 0.93  CALCIUM 8.5* 8.8*      Assessment/Plan: POD # 6 s/p radical cystoprostatectomy/BPLND and orthotopic ileal neobladder - Ambulate, IS, DVT prophylaxis (will reinforce teaching for Lovenox injections at home) - Regular diet - SL IVF - Probable D/C home later today if tolerating diet   LOS: 6 days   Mikail Goostree,LES 11/14/2017, 8:07 AM

## 2017-11-14 NOTE — Discharge Summary (Signed)
Date of admission: 11/08/2017  Date of discharge: 11/14/2017  Admission diagnosis:  1.  Prostate cancer 2.  Bladder cancer  Discharge diagnosis:  1.  Prostate cancer 2.  Bladder cancer  Secondary diagnoses: None  History and Physical: For full details, please see admission history and physical. Briefly, Rodney [name] is a 47 y.o. year old patient with prostate cancer and urothelial carcinoma the bladder.   Hospital Course: He was taken to the operating room on 11/08/2017 and underwent a robot-assisted laparoscopic radical cystoprostatectomy and bilateral pelvic lymphadenectomy and an orthotopic ileal neobladder.  He tolerated his procedure well and without complications.  Postoperatively, he was transferred to the stepdown intensive care unit for close monitoring.  He remained stable and was able to be transferred to a regular hospital room on postoperative day 1.  He was able to begin ambulating and was provided pharmacologic DVT prophylaxis.  His diet was gradually advanced.  On postoperative day 3, he did have an episode of severe abdominal pain.  Ultimately, this was thought to be related to mucous obstruction of his catheter and his pain was immediately relieved following irrigation.  He continued to demonstrate return of bowel function and his diet was gradually advanced until he was tolerating a regular diet beginning on the evening of postoperative day 5 and throughout postoperative day 6.  Both of his drains were evaluated and the fluid was found to be consistent with serum and they were removed.  His pain was able to be controlled with oral pain medication.  He demonstrated an ability to both irrigate his own catheter and administer Lovenox subcutaneously.  By postoperative day 6, he was able to be discharged home.  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  These results were discussed with the patient  during his hospitalization.  Laboratory values: No results for input(s): HGB, HCT in the last 72 hours. Recent Labs    11/12/17 0549 11/14/17 0534  CREATININE 1.06 0.93    Disposition: Home  Discharge instruction: The patient was instructed to be ambulatory but told to refrain from heavy lifting, strenuous activity, or driving.   Discharge medications:  Allergies as of 11/14/2017      Reactions   Morphine And Related Itching   Vicodin [hydrocodone-acetaminophen] Itching      Medication List    STOP taking these medications   oxyCODONE-acetaminophen 7.5-325 MG tablet Commonly known as:  PERCOCET     TAKE these medications   enoxaparin 40 MG/0.4ML injection Commonly known as:  LOVENOX Inject 0.4 mLs (40 mg total) into the skin daily.   traMADol 50 MG tablet Commonly known as:  ULTRAM Take 1-2 tablets (50-100 mg total) by mouth every 6 (six) hours as needed for moderate pain.       Followup:  Follow-up Information    Raynelle Bring, MD On 11/28/2017.   Specialty:  Urology Why:  at 12:45 Contact information: [address] [place] [address] [phone]

## 2017-11-14 NOTE — Discharge Instructions (Signed)
1. Activity:  You are encouraged to ambulate frequently (about every hour during waking hours) to help prevent blood clots from forming in your legs or lungs.  However, you should not engage in any heavy lifting (> 10-15 lbs), strenuous activity, or straining. 2. Diet: You can continue a regular diet.  It will be normal to have some amount of bloating, nausea, and abdominal discomfort intermittently. 3. Prescriptions:  You will be provided a prescription for pain medication to take as needed.  If your pain is not severe enough to require the prescription pain medication, you may take Tylenol instead.  You should also take an over the counter stool softener (Colace 100 mg twice daily) to avoid straining with bowel movements as the pain medication may constipate you.  4. Catheter care: You will be taught how to take care of the catheter by the nursing staff prior to discharge from the hospital.  You may use both a leg bag and the larger bedside bag but it is recommended to at least use the bigger bedside bag at nighttime as the leg bag is small and will fill up overnight and also does not drain as well when lying flat. You may periodically feel a strong urge to void with the catheter in place.  This is a bladder spasm and most often can occur when having a bowel movement or when you are moving around. It is typically self-limited and usually will stop after a few minutes.  You may use some Vaseline or Neosporin around the tip of the catheter to reduce friction at the tip of the penis. 5. Incisions: You may remove your dressing bandages the 2nd day after surgery.  You most likely will have a few small staples in each of the incisions and once the bandages are removed, the incisions may stay open to air.  You may start showering (not soaking or bathing in water) 48 hours after surgery and the incisions simply need to be patted dry after the shower.  No additional care is needed. 6. What to call us about: You should  call the office [phone]) if you develop fever > 101, persistent vomiting, or the catheter stops draining. Also, feel free to call with any other questions you may have and remember the handout that was provided to you as a reference preoperatively which answers many of the common questions that arise after surgery. 7. You may resume aspirin, advil, aleve, vitamins, and supplements 7 days after surgery.

## 2018-06-28 ENCOUNTER — Encounter (HOSPITAL_COMMUNITY): Payer: Self-pay

## 2018-06-28 ENCOUNTER — Emergency Department (HOSPITAL_COMMUNITY)
Admission: EM | Admit: 2018-06-28 | Discharge: 2018-06-28 | Disposition: A | Discharge status: Home / Self Care | Payer: PRIVATE HEALTH INSURANCE | Attending: Emergency Medicine | Admitting: Emergency Medicine

## 2018-06-28 DIAGNOSIS — T83091A Other mechanical complication of indwelling urethral catheter, initial encounter: Secondary | ICD-10-CM

## 2018-06-28 DIAGNOSIS — Z8546 Personal history of malignant neoplasm of prostate: Secondary | ICD-10-CM | POA: Diagnosis not present

## 2018-06-28 DIAGNOSIS — Y828 Other medical devices associated with adverse incidents: Secondary | ICD-10-CM | POA: Insufficient documentation

## 2018-06-28 DIAGNOSIS — Z87891 Personal history of nicotine dependence: Secondary | ICD-10-CM | POA: Diagnosis not present

## 2018-06-28 DIAGNOSIS — Z8551 Personal history of malignant neoplasm of bladder: Secondary | ICD-10-CM | POA: Insufficient documentation

## 2018-06-28 DIAGNOSIS — R338 Other retention of urine: Secondary | ICD-10-CM | POA: Diagnosis present

## 2018-06-28 NOTE — ED Triage Notes (Signed)
Pt complains of urinary retention since 930pm, he changed the leg bag to a regular bag and it quit draining Pt's Dr advised not to take this catheter out for any reason Pt had bladder cancer and his intestines are now his bladder, the scar tissue was growing over the urethra opening and that's why he had to have the catheter inserted yesterday.

## 2018-06-28 NOTE — ED Provider Notes (Signed)
[facility] DEPT Provider Note   CSN: [id number] Arrival date & time: 06/28/18  6568    History   Chief Complaint Chief Complaint  Patient presents with  . Urinary Retention    HPI Rodney [name] is a 48 y.o. male.     Patient presents to the emergency department for evaluation of urinary retention.  Patient has a history of ileal orthotopic neobladder surgery secondary to bladder cancer and prostate cancer.  He had been having symptoms of urinary retention for more than 1 day, was seen at urology earlier today and had a Foley catheter placed.  He has noticed that he has not been putting out any urine over the last hours and now is experiencing bladder fullness and pain similar to when he was obstructed.     Past Medical History:  Diagnosis Date  . Anxiety   . Bladder tumor   . Blood in urine    saw some 3 days ago but none inthe last 2 days   . Bradycardia   . Cancer ([place])   . Depression   . Dizziness    historical   . Elevated PSA   . Family history of colon cancer   . Family history of gastric cancer   . Family history of melanoma   . Family history of prostate cancer   . Heartburn    occasional   . Pain with urination   . Prostate cancer ([place])   . Urothelial cancer ([place])   . 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 Bladder cancer ([place]) 11/08/2017  . Genetic testing 10/26/2017  . Family history of prostate cancer   . Family history of gastric cancer   . Family history of melanoma   . Family history of colon cancer   . Urothelial cancer ([place])   . Malignant neoplasm of prostate ([place]) 09/11/2017    Past Surgical History:  Procedure Laterality Date  . BACK SURGERY  2012   3 disc; dr Carloyn Manner  did first and dr elsner did the last 2   . CYSTOSCOPY W/ RETROGRADES N/A 07/02/2017   Procedure: CYSTOSCOPY WITH RETROGRADE PYELOGRAM/EXAM  UNDER ANESTHESIA;  Surgeon: Raynelle Bring, MD;  Location: [facility];  Service: Urology;  Laterality: N/A;  . CYSTOSCOPY WITH BIOPSY N/A 08/09/2017   Procedure: CYSTOSCOPY WITH PROSTATE NEEDLE BIOPSY;  Surgeon: Raynelle Bring, MD;  Location: [facility];  Service: Urology;  Laterality: N/A;  GENERAL ANESTHESIA WITH PARALYSIS/ ONLY NEEDS 60 MIN FOR ALL PROCEDURES  . LEG SURGERY  2012   4 metal plates in plates   . TRANSRECTAL ULTRASOUND N/A 08/09/2017   Procedure: TRANSRECTAL ULTRASOUND;  Surgeon: Raynelle Bring, MD;  [facility];  Service: Urology;  Laterality: N/A;  ONLY NEEDS 60 MIN FOR ALL PROCEDURES  . TRANSURETHRAL RESECTION OF BLADDER TUMOR N/A 07/02/2017   Procedure: TRANSURETHRAL RESECTION OF BLADDER TUMOR (TURBT);  Surgeon: Raynelle Bring, MD;  [facility];  Service: Urology;  Laterality: N/A;  . TRANSURETHRAL RESECTION OF BLADDER TUMOR N/A 08/09/2017   Procedure: TRANSURETHRAL RESECTION OF BLADDER TUMOR (TURBT);  Surgeon: Raynelle Bring, MD;  [facility];  Service: Urology;  Laterality: N/A;        Home Medications    Prior to Admission medications   Medication Sig Start Date End Date Taking? Authorizing Provider  enoxaparin (LOVENOX) 40 MG/0.4ML injection Inject 0.4 mLs (40 mg total)  into the skin daily. 11/14/17   Raynelle Bring, MD  traMADol (ULTRAM) 50 MG tablet Take 1-2 tablets (50-100 mg total) by mouth every 6 (six) hours as needed for moderate pain. 11/13/17   Raynelle Bring, MD    Family History Family History  Problem Relation Age of Onset  . Heart attack Father   . Hypertension Father   . Diabetes Father   . Heart failure Father   . Hypertension Mother   . Gastric cancer Sister 26       d. 18  . Prostate cancer Brother 1  . Melanoma Maternal Aunt   . Stroke Maternal Uncle   . Stroke Paternal Uncle   . Melanoma Maternal Grandmother        dx under 40  . Colon cancer Maternal Grandmother   . Heart disease Maternal Grandfather   . Heart attack Paternal  Grandfather     Social History Social History   Tobacco Use  . Smoking status: Former Smoker    Packs/day: 2.00    Years: 8.00    Pack years: 16.00    Types: Cigarettes    Quit date: 05/29/2016    Years since quitting: 2.0  . Smokeless tobacco: Former Systems developer  . Tobacco comment: [place] 2009  Substance Use Topics  . Alcohol use: Not Currently  . Drug use: Not Currently    Types: Marijuana    Comment: last use was 05-29-2017     Allergies   Morphine and related and Vicodin [hydrocodone-acetaminophen]   Review of Systems Review of Systems  Genitourinary: Positive for decreased urine volume.  All other systems reviewed and are negative.    Physical Exam Updated Vital Signs BP 131/89 (BP Location: Left Arm)   Pulse 67   Temp 97.8 F (36.6 C) (Oral)   Resp 18   SpO2 98%   Physical Exam Vitals signs and nursing note reviewed.  Constitutional:      General: He is not in acute distress.    Appearance: Normal appearance. He is well-developed.  HENT:     Head: Normocephalic and atraumatic.     Right Ear: Hearing normal.     Left Ear: Hearing normal.     Nose: Nose normal.  Eyes:     Conjunctiva/sclera: Conjunctivae normal.     Pupils: Pupils are equal, round, and reactive to light.  Neck:     Musculoskeletal: Normal range of motion and neck supple.  Cardiovascular:     Rate and Rhythm: Regular rhythm.     Heart sounds: S1 normal and S2 normal. No murmur. No friction rub. No gallop.   Pulmonary:     Effort: Pulmonary effort is normal. No respiratory distress.     Breath sounds: Normal breath sounds.  Chest:     Chest wall: No tenderness.  Abdominal:     General: Bowel sounds are normal.     Palpations: Abdomen is soft.     Tenderness: There is no abdominal tenderness. There is no guarding or rebound. Negative signs include Murphy's sign and McBurney's sign.     Hernia: No hernia is present.  Musculoskeletal: Normal range of motion.  Skin:    General: Skin is  warm and dry.     Findings: No rash.  Neurological:     Mental Status: He is alert and oriented to person, place, and time.     GCS: GCS eye subscore is 4. GCS verbal subscore is 5. GCS motor subscore is 6.  Cranial Nerves: No cranial nerve deficit.     Sensory: No sensory deficit.     Coordination: Coordination normal.  Psychiatric:        Speech: Speech normal.        Behavior: Behavior normal.        Thought Content: Thought content normal.      ED Treatments / Results  Labs (all labs ordered are listed, but only abnormal results are displayed) Labs Reviewed - No data to display  EKG None  Radiology No results found.  Procedures Procedures (including critical care time)  Medications Ordered in ED Medications - No data to display   Initial Impression / Assessment and Plan / ED Course  I have reviewed the triage vital signs and the nursing notes.  Pertinent labs & imaging results that were available during my care of the patient were reviewed by me and considered in my medical decision making (see chart for details).        Patient presents to the emergency department for evaluation of urinary retention.  Patient had a Foley catheter placed earlier at his urologists office.  He reports that they had difficulty placing the Foley catheter at the urologist office this morning and therefore replacing the catheter was not considered.  He reports that he has significant amount of mucus production secondary to his ileal neobladder, aching obstruction likely.  His Foley catheter was irrigated and promptly began to drain. He will be discharged, follow-up with urology as needed.  Final Clinical Impressions(s) / ED Diagnoses   Final diagnoses:  Obstruction of Foley catheter, initial encounter [facility])    ED Discharge Orders    None       Betsey Holiday Gwenyth Allegra, MD 06/28/18 [phone]

## 2018-07-04 ENCOUNTER — Encounter (HOSPITAL_COMMUNITY): Payer: Self-pay | Admitting: Emergency Medicine

## 2018-07-04 ENCOUNTER — Emergency Department (HOSPITAL_COMMUNITY)
Admission: EM | Admit: 2018-07-04 | Discharge: 2018-07-05 | Disposition: A | Discharge status: Home / Self Care | Payer: PRIVATE HEALTH INSURANCE | Attending: Emergency Medicine | Admitting: Emergency Medicine

## 2018-07-04 DIAGNOSIS — Z87891 Personal history of nicotine dependence: Secondary | ICD-10-CM | POA: Insufficient documentation

## 2018-07-04 DIAGNOSIS — Z8546 Personal history of malignant neoplasm of prostate: Secondary | ICD-10-CM | POA: Diagnosis not present

## 2018-07-04 DIAGNOSIS — R1010 Upper abdominal pain, unspecified: Secondary | ICD-10-CM | POA: Diagnosis present

## 2018-07-04 DIAGNOSIS — R1013 Epigastric pain: Secondary | ICD-10-CM | POA: Insufficient documentation

## 2018-07-04 DIAGNOSIS — Z8551 Personal history of malignant neoplasm of bladder: Secondary | ICD-10-CM | POA: Diagnosis not present

## 2018-07-04 MED ORDER — DICYCLOMINE HCL 10 MG PO CAPS
10.0000 mg | ORAL_CAPSULE | Freq: Once | ORAL | Status: AC
  Administered 2018-07-04: 10 mg via ORAL
  Filled 2018-07-04: qty 1

## 2018-07-04 MED ORDER — ALUM & MAG HYDROXIDE-SIMETH 200-200-20 MG/5ML PO SUSP
30.0000 mL | Freq: Once | ORAL | Status: AC
  Administered 2018-07-04: 30 mL via ORAL
  Filled 2018-07-04: qty 30

## 2018-07-04 NOTE — ED Triage Notes (Signed)
Patient here from home with complaints of urinary retention that started at 6pm tonight. Reports that he has tried to flush foley with no relief. Hx of bladder cancer with extensive surgery.

## 2018-07-05 ENCOUNTER — Emergency Department (HOSPITAL_COMMUNITY): Payer: PRIVATE HEALTH INSURANCE

## 2018-07-05 LAB — COMPREHENSIVE METABOLIC PANEL
ALT: 15 U/L (ref 0–44)
AST: 15 U/L (ref 15–41)
Albumin: 3.6 g/dL (ref 3.5–5.0)
Alkaline Phosphatase: 124 U/L (ref 38–126)
Anion gap: 9 (ref 5–15)
BUN: 24 mg/dL — ABNORMAL HIGH (ref 6–20)
CO2: 27 mmol/L (ref 22–32)
Calcium: 10 mg/dL (ref 8.9–10.3)
Chloride: 102 mmol/L (ref 98–111)
Creatinine, Ser: 0.92 mg/dL (ref 0.61–1.24)
GFR calc Af Amer: >60 mL/min (ref >60)
GFR calc non Af Amer: >60 mL/min (ref >60)
Glucose, Bld: 112 mg/dL — ABNORMAL HIGH (ref 70–99)
Potassium: 4.5 mmol/L (ref 3.5–5.1)
Sodium: 138 mmol/L (ref 135–145)
Total Bilirubin: 0.2 mg/dL — ABNORMAL LOW (ref 0.3–1.2)
Total Protein: 7.5 g/dL (ref 6.5–8.1)

## 2018-07-05 LAB — CBC WITH DIFFERENTIAL/PLATELET
Abs Immature Granulocytes: 0.06 10*3/uL (ref 0.00–0.07)
Basophils Absolute: 0 10*3/uL (ref 0.0–0.1)
Basophils Relative: 0 %
Eosinophils Absolute: 0.2 10*3/uL (ref 0.0–0.5)
Eosinophils Relative: 2 %
HCT: 38 % — ABNORMAL LOW (ref 39.0–52.0)
Hemoglobin: 12.3 g/dL — ABNORMAL LOW (ref 13.0–17.0)
Immature Granulocytes: 1 %
Lymphocytes Relative: 22 %
Lymphs Abs: 2.6 10*3/uL (ref 0.7–4.0)
MCH: 32.6 pg (ref 26.0–34.0)
MCHC: 32.4 g/dL (ref 30.0–36.0)
MCV: 100.8 fL — ABNORMAL HIGH (ref 80.0–100.0)
Monocytes Absolute: 0.9 10*3/uL (ref 0.1–1.0)
Monocytes Relative: 7 %
Neutro Abs: 8.1 10*3/uL — ABNORMAL HIGH (ref 1.7–7.7)
Neutrophils Relative %: 68 %
Platelets: 354 10*3/uL (ref 150–400)
RBC: 3.77 MIL/uL — ABNORMAL LOW (ref 4.22–5.81)
RDW: 11.6 % (ref 11.5–15.5)
WBC: 12 10*3/uL — ABNORMAL HIGH (ref 4.0–10.5)
nRBC: 0 % (ref 0.0–0.2)

## 2018-07-05 LAB — LIPASE, BLOOD: Lipase: 34 U/L (ref 11–51)

## 2018-07-05 MED ORDER — POLYETHYLENE GLYCOL 3350 17 G PO PACK
17.0000 g | PACK | Freq: Once | ORAL | Status: AC
  Administered 2018-07-05: 17 g via ORAL
  Filled 2018-07-05: qty 1

## 2018-07-05 NOTE — ED Provider Notes (Signed)
[facility] DEPT Provider Note   CSN: [id number] Arrival date & time: 07/04/18  2139    History   Chief Complaint Chief Complaint  Patient presents with   Urinary Retention    HPI Rodney [name] is a 48 y.o. male.     48 yo M with a chief complaints of upper abdominal pain.  Going on for about a week and a half.  Denies fevers or chills denies chest pain or shortness of breath.  Denies vomiting.  Patient has unfortunately had his prostate and bladder removed due to cancer and has a neobladder in place.  He is worried that it is obstructed.  Was seen in the ED not long ago with a similar presentation and had his catheter irrigated with improvement.  Describing the upper abdominal pain is sharp and goes to his back.  Feels that the pain is actually worse in his back.  Worse with movement palpation twisting.  Not worse with eating.  The history is provided by the patient.  Illness Severity:  Moderate Onset quality:  Gradual Duration:  10 days Timing:  Constant Progression:  Worsening Chronicity:  New Associated symptoms: abdominal pain   Associated symptoms: no chest pain, no congestion, no diarrhea, no fever, no headaches, no myalgias, no rash, no shortness of breath and no vomiting     Past Medical History:  Diagnosis Date   Anxiety    Bladder tumor    Blood in urine    saw some 3 days ago but none inthe last 2 days    Bradycardia    Cancer ([facility])    Depression    Dizziness    historical    Elevated PSA    Family history of colon cancer    Family history of gastric cancer    Family history of melanoma    Family history of prostate cancer    Heartburn    occasional    Pain with urination    Prostate cancer ([place])    Urothelial cancer ([place])    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[place])     Patient Active Problem List   Diagnosis Date Noted   Status post radical cystoprostatectomy 11/08/2017   Bladder cancer ([place]) 11/08/2017    Genetic testing 10/26/2017   Family history of prostate cancer    Family history of gastric cancer    Family history of melanoma    Family history of colon cancer    Urothelial cancer ([place])    Malignant neoplasm of prostate ([place]) 09/11/2017    Past Surgical History:  Procedure Laterality Date   BACK SURGERY  2012   3 disc; dr Carloyn Manner  did first and dr elsner did the last 2    CYSTOSCOPY W/ RETROGRADES N/A 07/02/2017   Procedure: CYSTOSCOPY WITH RETROGRADE PYELOGRAM/EXAM UNDER ANESTHESIA;  Surgeon: Raynelle Bring, MD;  [facility];  Service: Urology;  Laterality: N/A;   CYSTOSCOPY WITH BIOPSY N/A 08/09/2017   Procedure: CYSTOSCOPY WITH PROSTATE NEEDLE BIOPSY;  Surgeon: Raynelle Bring, MD;  [facility];  Service: Urology;  Laterality: N/A;  GENERAL ANESTHESIA WITH PARALYSIS/ ONLY NEEDS 60 MIN FOR ALL PROCEDURES   LEG SURGERY  2012   4 metal plates in plates    TRANSRECTAL ULTRASOUND N/A 08/09/2017   Procedure: TRANSRECTAL ULTRASOUND;  Surgeon: Raynelle Bring, MD;  [facility];  Service: Urology;  Laterality: N/A;  ONLY NEEDS 60 MIN FOR ALL PROCEDURES   TRANSURETHRAL RESECTION OF BLADDER TUMOR N/A 07/02/2017   Procedure:  TRANSURETHRAL RESECTION OF BLADDER TUMOR (TURBT);  Surgeon: Raynelle Bring, MD;  [facility];  Service: Urology;  Laterality: N/A;   TRANSURETHRAL RESECTION OF BLADDER TUMOR N/A 08/09/2017   Procedure: TRANSURETHRAL RESECTION OF BLADDER TUMOR (TURBT);  Surgeon: Raynelle Bring, MD;  [facility];  Service: Urology;  Laterality: N/A;        Home Medications    Prior to Admission medications   Medication Sig Start Date End Date Taking? Authorizing Provider  enoxaparin (LOVENOX) 40 MG/0.4ML injection Inject 0.4 mLs (40 mg total) into the skin daily. 11/14/17   Raynelle Bring, MD  traMADol (ULTRAM) 50 MG tablet Take 1-2 tablets (50-100 mg total) by mouth every 6 (six) hours as needed for moderate pain. 11/13/17   Raynelle Bring, MD    Family  History Family History  Problem Relation Age of Onset   Heart attack Father    Hypertension Father    Diabetes Father    Heart failure Father    Hypertension Mother    Gastric cancer Sister 25       d. 6   Prostate cancer Brother 52   Melanoma Maternal Aunt    Stroke Maternal Uncle    Stroke Paternal Uncle    Melanoma Maternal Grandmother        dx under 35   Colon cancer Maternal Grandmother    Heart disease Maternal Grandfather    Heart attack Paternal Grandfather     Social History Social History   Tobacco Use   Smoking status: Former Smoker    Packs/day: 2.00    Years: 8.00    Pack years: 16.00    Types: Cigarettes    Quit date: 05/29/2016    Years since quitting: 2.1   Smokeless tobacco: Former Systems developer   Tobacco comment: [place] 2009  Substance Use Topics   Alcohol use: Not Currently   Drug use: Not Currently    Types: Marijuana    Comment: last use was 05-29-2017     Allergies   Morphine and related and Vicodin [hydrocodone-acetaminophen]   Review of Systems Review of Systems  Constitutional: Negative for chills and fever.  HENT: Negative for congestion and facial swelling.   Eyes: Negative for discharge and visual disturbance.  Respiratory: Negative for shortness of breath.   Cardiovascular: Negative for chest pain and palpitations.  Gastrointestinal: Positive for abdominal pain. Negative for diarrhea and vomiting.  Musculoskeletal: Negative for arthralgias and myalgias.  Skin: Negative for color change and rash.  Neurological: Negative for tremors, syncope and headaches.  Psychiatric/Behavioral: Negative for confusion and dysphoric mood.     Physical Exam Updated Vital Signs BP 128/89 (BP Location: Right Arm)    Pulse (!) 106    Temp 98.6 F (37 C) (Oral)    Resp 20    SpO2 97%   Physical Exam Vitals signs and nursing note reviewed.  Constitutional:      Appearance: He is well-developed.  HENT:     Head: Normocephalic and  atraumatic.  Eyes:     Pupils: Pupils are equal, round, and reactive to light.  Neck:     Musculoskeletal: Normal range of motion and neck supple.     Vascular: No JVD.  Cardiovascular:     Rate and Rhythm: Normal rate and regular rhythm.     Heart sounds: No murmur. No friction rub. No gallop.   Pulmonary:     Effort: No respiratory distress.     Breath sounds: No wheezing.  Abdominal:  General: There is no distension.     Tenderness: There is abdominal tenderness. There is no guarding or rebound.     Comments: Diffusely tender to the abdomen along the upper portion.  Worse in the right upper quadrant.  Negative Murphy sign.  Musculoskeletal: Normal range of motion.  Skin:    Coloration: Skin is not pale.     Findings: No rash.  Neurological:     Mental Status: He is alert and oriented to person, place, and time.  Psychiatric:        Behavior: Behavior normal.      ED Treatments / Results  Labs (all labs ordered are listed, but only abnormal results are displayed) Labs Reviewed  CBC WITH DIFFERENTIAL/PLATELET - Abnormal; Notable for the following components:      Result Value   WBC 12.0 (*)    RBC 3.77 (*)    Hemoglobin 12.3 (*)    HCT 38.0 (*)    MCV 100.8 (*)    Neutro Abs 8.1 (*)    All other components within normal limits  COMPREHENSIVE METABOLIC PANEL - Abnormal; Notable for the following components:   Glucose, Bld 112 (*)    BUN 24 (*)    Total Bilirubin 0.2 (*)    All other components within normal limits  LIPASE, BLOOD    EKG None  Radiology Ct Renal Stone Study  Result Date: 07/05/2018 CLINICAL DATA:  History of prostate cancer, bladder cancer, postop. Flank pain. EXAM: CT ABDOMEN AND PELVIS WITHOUT CONTRAST TECHNIQUE: Multidetector CT imaging of the abdomen and pelvis was performed following the standard protocol without IV contrast. COMPARISON:  PET CT 09/25/2017.  CT abdomen and pelvis 06/18/2017. FINDINGS: Lower chest: Mild wall thickening in  the lingula with airspace opacity, likely atelectasis. No effusions. Heart is normal size. Hepatobiliary: No focal hepatic abnormality. Gallbladder unremarkable. Pancreas: No focal abnormality or ductal dilatation. Spleen: No focal abnormality.  Normal size. Adrenals/Urinary Tract: Multiple bilateral renal cysts. No hydronephrosis. No renal or ureteral stones. Adrenal glands unremarkable. Foley catheter present in the neobladder which is grossly unremarkable. Stomach/Bowel: Postoperative changes in the right lower quadrant. Moderate stool burden in the colon. No evidence of a bowel obstruction. Vascular/Lymphatic: Aortic atherosclerosis. No enlarged abdominal or pelvic lymph nodes. Reproductive: No visible focal abnormality. Other: No free fluid or free air. Musculoskeletal: Postoperative changes in the right pelvis. No acute bony abnormality. IMPRESSION: Foley catheter in decompressed neobladder.  No hydronephrosis. Bilateral renal cysts are stable since prior study. Moderate stool in the colon. Aortic atherosclerosis. No acute findings. Electronically Signed   By: Rolm Baptise M.D.   On: 07/05/2018 00:49    Procedures Procedures (including critical care time)  Medications Ordered in ED Medications  polyethylene glycol (MIRALAX / GLYCOLAX) packet 17 g (has no administration in time range)  alum & mag hydroxide-simeth (MAALOX/MYLANTA) 200-200-20 MG/5ML suspension 30 mL (30 mLs Oral Given 07/04/18 2352)  dicyclomine (BENTYL) capsule 10 mg (10 mg Oral Given 07/04/18 2352)     Initial Impression / Assessment and Plan / ED Course  I have reviewed the triage vital signs and the nursing notes.  Pertinent labs & imaging results that were available during my care of the patient were reviewed by me and considered in my medical decision making (see chart for details).        48 yo M with a chief complaint of upper abdominal pain.  Going on for the past week and a half.  Patient has a history of  prostate  cancer which required removal of the prostate and the bladder.  Patient is concerned that he may be has an obstruction to his Foley catheter.  Patient had a bladder scan with no urine in the bladder and a freely flowing urine in his catheter bag.  His pain for me is mostly to the upper portion of the abdomen.  Will obtain abdominal lab work.  Give a GI cocktail.  CT stone study.  Patient has a very mild leukocytosis.  LFTs and lipase are normal.  His CT scan has an increase stool burden as viewed by me.  No acute findings otherwise.  I discussed this with him and he will do a trial of MiraLAX at home.  Urology and PCP follow-up.  1:10 AM:  I have discussed the diagnosis/risks/treatment options with the patient and believe the pt to be eligible for discharge home to follow-up with PCP, urology. We also discussed returning to the ED immediately if new or worsening sx occur. We discussed the sx which are most concerning (e.g., sudden worsening pain, fever, inability to tolerate by mouth) that necessitate immediate return. Medications administered to the patient during their visit and any new prescriptions provided to the patient are listed below.  Medications given during this visit Medications  polyethylene glycol (MIRALAX / GLYCOLAX) packet 17 g (has no administration in time range)  alum & mag hydroxide-simeth (MAALOX/MYLANTA) 200-200-20 MG/5ML suspension 30 mL (30 mLs Oral Given 07/04/18 2352)  dicyclomine (BENTYL) capsule 10 mg (10 mg Oral Given 07/04/18 2352)     The patient appears reasonably screen and/or stabilized for discharge and I doubt any other medical condition or other [facility] requiring further screening, evaluation, or treatment in the ED at this time prior to discharge.    Final Clinical Impressions(s) / ED Diagnoses   Final diagnoses:  Epigastric abdominal pain    ED Discharge Orders    None       Deno Etienne, DO 07/05/18 0110

## 2018-07-05 NOTE — Discharge Instructions (Signed)
Take 8 scoops of miralax in 32oz of whatever you would like to drink.(Gatorade comes in this size) You can also use a fleets enema which you can buy over the counter at the pharmacy.  Return for worsening abdominal pain, vomiting or fever. ? ?

## 2018-07-07 ENCOUNTER — Emergency Department (HOSPITAL_COMMUNITY): Payer: PRIVATE HEALTH INSURANCE

## 2018-07-07 ENCOUNTER — Emergency Department (HOSPITAL_COMMUNITY)
Admission: EM | Admit: 2018-07-07 | Discharge: 2018-07-07 | Disposition: A | Discharge status: Home / Self Care | Payer: PRIVATE HEALTH INSURANCE | Attending: Emergency Medicine | Admitting: Emergency Medicine

## 2018-07-07 ENCOUNTER — Other Ambulatory Visit: Payer: Self-pay

## 2018-07-07 DIAGNOSIS — Z7901 Long term (current) use of anticoagulants: Secondary | ICD-10-CM | POA: Diagnosis not present

## 2018-07-07 DIAGNOSIS — G8929 Other chronic pain: Secondary | ICD-10-CM | POA: Diagnosis not present

## 2018-07-07 DIAGNOSIS — M545 Low back pain, unspecified: Secondary | ICD-10-CM

## 2018-07-07 DIAGNOSIS — Z87891 Personal history of nicotine dependence: Secondary | ICD-10-CM | POA: Diagnosis not present

## 2018-07-07 MED ORDER — OXYCODONE-ACETAMINOPHEN 10-325 MG PO TABS: 1.0000 | ORAL_TABLET | ORAL | 0 refills | Status: DC | PRN

## 2018-07-07 MED ORDER — OXYCODONE-ACETAMINOPHEN 5-325 MG PO TABS
1.0000 | ORAL_TABLET | Freq: Once | ORAL | Status: AC
  Administered 2018-07-07: 1 via ORAL
  Filled 2018-07-07: qty 1

## 2018-07-07 MED ORDER — DEXAMETHASONE SODIUM PHOSPHATE 10 MG/ML IJ SOLN
10.0000 mg | Freq: Once | INTRAMUSCULAR | Status: AC
  Administered 2018-07-07: 07:00:00 10 mg via INTRAMUSCULAR
  Filled 2018-07-07: qty 1

## 2018-07-07 MED ORDER — PREDNISONE 10 MG PO TABS: 20.0000 mg | ORAL_TABLET | Freq: Every day | ORAL | 0 refills | Status: DC

## 2018-07-07 MED ORDER — GADOBUTROL 1 MMOL/ML IV SOLN
8.0000 mL | Freq: Once | INTRAVENOUS | Status: AC | PRN
  Administered 2018-07-07: 8 mL via INTRAVENOUS

## 2018-07-07 NOTE — ED Notes (Addendum)
Attempted to ambulate pt. Pt states having pain in left leg and lower abdominal pain. Pt unable to ambulate at this time.

## 2018-07-07 NOTE — ED Provider Notes (Signed)
Patient seen after signout from prior ED provider.  Patient's back pain is failed to improve significantly.  He is unable to ambulate without significant discomfort.  MRI L-spine ordered for further evaluation.  Will re-evaluate pending MRI imaging.    Valarie Merino, MD 07/07/18 [phone]

## 2018-07-07 NOTE — ED Provider Notes (Signed)
Patient seen after signout from prior ED provider.  Patient with persistent low back discomfort.  Patient's symptoms have not improved with minimal pain intervention.  ED MRI obtained for further work-up.  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  No evidence of spinal cord impingement or emergent process.   Patient does have established follow-up for further treatment of this finding.  He now desires discharge home following his ED evaluation.  He reports that he feels improved.     Valarie Merino, MD 07/07/18 1525

## 2018-07-07 NOTE — ED Triage Notes (Signed)
Pt c/o Left lower back pain that shoots down his left leg.  Pt states he can't stand on his left leg

## 2018-07-07 NOTE — ED Notes (Signed)
Pt c/o lower lt sided back pain and lt lower abd pain for one week worse for the past 2 days  He has a foley catheter

## 2018-07-07 NOTE — ED Provider Notes (Signed)
[facility] Provider Note   CSN: [id number] Arrival date & time: 07/07/18  0205     History   Chief Complaint Chief Complaint  Patient presents with  . Back Pain    HPI Rodney [name] is a 48 y.o. male.     HPI  This is a 48 year old male with a history of bladder cancer, urinary retention with Foley catheter who presents with back pain.  Patient reports 1 week history of worsening back pain that radiates into the left leg.  He reports history of the same and sciatica.  He states the pain has gotten so bad that he has had difficulty walking.  He denies weakness or numbness.  He has a urinary catheter so does not know whether he has retention.  He rates his pain at 10 out of 10.  It is worse with ambulation.  Patient has taken Aleve with minimal relief.  Of note, he had a recent CT scan 2 days ago which was reassuring.  There were no obvious metastatic lesions to the lower lumbar spine.  Past Medical History:  Diagnosis Date  . Anxiety   . Bladder tumor   . Blood in urine    saw some 3 days ago but none inthe last 2 days   . Bradycardia   . Cancer ([place])   . Depression   . Dizziness    historical   . Elevated PSA   . Family history of colon cancer   . Family history of gastric cancer   . Family history of melanoma   . Family history of prostate cancer   . Heartburn    occasional   . Pain with urination   . Prostate cancer ([place])   . Urothelial cancer ([place])   . Urothelial carcinoma [facility])     Patient Active Problem List   Diagnosis Date Noted  . 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 Bladder cancer ([place]) 11/08/2017  . Genetic testing 10/26/2017  . Family history of prostate cancer   . Family history of gastric cancer   . Family history of melanoma   . Family history of colon cancer   . Urothelial cancer ([place])   . Malignant neoplasm of prostate ([place]) 09/11/2017    Past Surgical History:  Procedure  Laterality Date  . BACK SURGERY  2012   3 disc; dr Carloyn Manner  did first and dr elsner did the last 2   . CYSTOSCOPY W/ RETROGRADES N/A 07/02/2017   Procedure: CYSTOSCOPY WITH RETROGRADE PYELOGRAM/EXAM UNDER ANESTHESIA;  Surgeon: Raynelle Bring, MD;  Location: [facility];  Service: Urology;  Laterality: N/A;  . CYSTOSCOPY WITH BIOPSY N/A 08/09/2017   Procedure: CYSTOSCOPY WITH PROSTATE NEEDLE BIOPSY;  Surgeon: Raynelle Bring, MD;  Location: [facility];  Service: Urology;  Laterality: N/A;  GENERAL ANESTHESIA WITH PARALYSIS/ ONLY NEEDS 60 MIN FOR ALL PROCEDURES  . LEG SURGERY  2012   4 metal plates in plates   . TRANSRECTAL ULTRASOUND N/A 08/09/2017   Procedure: TRANSRECTAL ULTRASOUND;  Surgeon: Raynelle Bring, MD;  Location: [facility];  Service: Urology;  Laterality: N/A;  ONLY NEEDS 60 MIN FOR ALL PROCEDURES  . TRANSURETHRAL RESECTION OF BLADDER TUMOR N/A 07/02/2017   Procedure: TRANSURETHRAL RESECTION OF BLADDER TUMOR (TURBT);  Surgeon: Raynelle Bring, MD;  Location: [facility];  Service: Urology;  Laterality: N/A;  . TRANSURETHRAL RESECTION OF BLADDER TUMOR N/A 08/09/2017   Procedure: TRANSURETHRAL RESECTION OF BLADDER TUMOR (TURBT);  Surgeon: Raynelle Bring, MD;  Location: [facility];  Service: Urology;  Laterality: N/A;        Home Medications    Prior to Admission medications   Medication Sig Start Date End Date Taking? Authorizing Provider  enoxaparin (LOVENOX) 40 MG/0.4ML injection Inject 0.4 mLs (40 mg total) into the skin daily. 11/14/17   Raynelle Bring, MD  traMADol (ULTRAM) 50 MG tablet Take 1-2 tablets (50-100 mg total) by mouth every 6 (six) hours as needed for moderate pain. 11/13/17   Raynelle Bring, MD    Family History Family History  Problem Relation Age of Onset  . Heart attack Father   . Hypertension Father   . Diabetes Father   . Heart failure Father   . Hypertension Mother   . Gastric cancer Sister 78       d. 18  . Prostate cancer Brother 38  . Melanoma Maternal Aunt   . Stroke  Maternal Uncle   . Stroke Paternal Uncle   . Melanoma Maternal Grandmother        dx under 31  . Colon cancer Maternal Grandmother   . Heart disease Maternal Grandfather   . Heart attack Paternal Grandfather     Social History Social History   Tobacco Use  . Smoking status: Former Smoker    Packs/day: 2.00    Years: 8.00    Pack years: 16.00    Types: Cigarettes    Quit date: 05/29/2016    Years since quitting: 2.1  . Smokeless tobacco: Former Systems developer  . Tobacco comment: quti 2009  Substance Use Topics  . Alcohol use: Not Currently  . Drug use: Not Currently    Types: Marijuana    Comment: last use was 05-29-2017     Allergies   Morphine and related and Vicodin [hydrocodone-acetaminophen]   Review of Systems Review of Systems  Constitutional: Negative for fever.  Respiratory: Negative for shortness of breath.   Cardiovascular: Negative for chest pain.  Gastrointestinal: Negative for abdominal pain.  Genitourinary: Negative for difficulty urinating.  Musculoskeletal: Positive for back pain.  Neurological: Negative for weakness and numbness.  All other systems reviewed and are negative.    Physical Exam Updated Vital Signs BP 122/85   Pulse 87   Temp 98.6 F (37 C) (Oral)   Resp 18   Ht 1.88 m (6\' 2" )   Wt 88 kg   SpO2 95%   BMI 24.91 kg/m   Physical Exam Vitals signs and nursing note reviewed.  Constitutional:      Appearance: He is well-developed.  HENT:     Head: Normocephalic and atraumatic.  Eyes:     Pupils: Pupils are equal, round, and reactive to light.  Neck:     Musculoskeletal: Neck supple.  Cardiovascular:     Rate and Rhythm: Normal rate and regular rhythm.     Heart sounds: Normal heart sounds. No murmur.  Pulmonary:     Effort: Pulmonary effort is normal. No respiratory distress.     Breath sounds: Normal breath sounds. No wheezing.  Abdominal:     General: Bowel sounds are normal.     Palpations: Abdomen is soft.     Tenderness:  There is no abdominal tenderness. There is no rebound.  Genitourinary:    Comments: Urinary catheter in place Musculoskeletal:     Comments: No midline tenderness palpation, step-off, deformity  Lymphadenopathy:     Cervical: No cervical adenopathy.  Skin:    General: Skin is warm and dry.  Neurological:  Mental Status: He is alert and oriented to person, place, and time.     Comments: Cranial nerves II through XII intact, speech, 5 out of 5 strength bilateral upper extremities, 4+/5 bilateral lower extremities, symmetric, no clonus, normal patellar reflexes bilaterally, positive straight leg raise on the left      ED Treatments / Results  Labs (all labs ordered are listed, but only abnormal results are displayed) Labs Reviewed - No data to display  EKG    Radiology No results found.  Procedures Procedures (including critical care time)  Medications Ordered in ED Medications  dexamethasone (DECADRON) injection 10 mg (10 mg Intramuscular Given 07/07/18 0636)  oxyCODONE-acetaminophen (PERCOCET/ROXICET) 5-325 MG per tablet 1 tablet (1 tablet Oral Given 07/07/18 0636)     Initial Impression / Assessment and Plan / ED Course  I have reviewed the triage vital signs and the nursing notes.  Pertinent labs & imaging results that were available during my care of the patient were reviewed by me and considered in my medical decision making (see chart for details).        Patient presents with back pain radiating into the left leg.  Symptoms are very suggestive of sciatica.  He is nonfocal on exam and strength appears intact.  He was given Decadron and hydrocodone.  I have reviewed his chart.  He had a CT scan 2 days ago with no obvious metastatic lesions.  However, given his history of cancer, this would be a red flag.  If he does not improve significantly with conservative management, would recommend MRI of the lower lumbar spine to rule out cauda equina.  Cannot assess for  bladder dysfunction given that he has a chronic indwelling Foley at this time.  Patient signed out to oncoming provider.  Final Clinical Impressions(s) / ED Diagnoses   Final diagnoses:  Chronic low back pain without sciatica, unspecified back pain laterality    ED Discharge Orders    None       Merryl Hacker, MD 07/07/18 2257

## 2018-07-07 NOTE — Discharge Instructions (Signed)
Please return any problem.  Follow-up with your regular care providers as instructed.  Your MRI performed today suggest the possibility of metastatic disease in your spine.  This is likely the cause of your pain.  It is important to follow-up with your regular care providers for further pain management planning.  Use Percocet and prednisone as prescribed for pain control over the short term.

## 2018-07-09 ENCOUNTER — Other Ambulatory Visit: Payer: Self-pay | Admitting: Oncology

## 2018-07-09 ENCOUNTER — Telehealth: Payer: Self-pay | Admitting: Oncology

## 2018-07-09 DIAGNOSIS — C61 Malignant neoplasm of prostate: Secondary | ICD-10-CM

## 2018-07-09 NOTE — Telephone Encounter (Signed)
Scheduled appt per 6/23 sch message - unable to reach pt . Left message with app date and time

## 2018-07-11 ENCOUNTER — Telehealth: Payer: Self-pay

## 2018-07-11 NOTE — Telephone Encounter (Signed)
Called and left voicemail regarding pre-screening questions for appt on 6/26  

## 2018-07-12 ENCOUNTER — Other Ambulatory Visit: Payer: Self-pay

## 2018-07-12 ENCOUNTER — Inpatient Hospital Stay: Payer: PRIVATE HEALTH INSURANCE | Attending: Oncology | Admitting: Oncology

## 2018-07-12 ENCOUNTER — Inpatient Hospital Stay: Payer: PRIVATE HEALTH INSURANCE

## 2018-07-12 VITALS — BP 125/89 | HR 88 | Temp 99.1°F | Resp 18 | Ht 74.0 in | Wt 189.9 lb

## 2018-07-12 DIAGNOSIS — K5900 Constipation, unspecified: Secondary | ICD-10-CM | POA: Insufficient documentation

## 2018-07-12 DIAGNOSIS — C679 Malignant neoplasm of bladder, unspecified: Secondary | ICD-10-CM | POA: Insufficient documentation

## 2018-07-12 DIAGNOSIS — M898X9 Other specified disorders of bone, unspecified site: Secondary | ICD-10-CM

## 2018-07-12 DIAGNOSIS — C7951 Secondary malignant neoplasm of bone: Secondary | ICD-10-CM | POA: Diagnosis not present

## 2018-07-12 DIAGNOSIS — C61 Malignant neoplasm of prostate: Secondary | ICD-10-CM | POA: Diagnosis present

## 2018-07-12 MED ORDER — PREDNISONE 10 MG PO TABS: 20.0000 mg | ORAL_TABLET | Freq: Every day | ORAL | 0 refills | Status: DC

## 2018-07-12 MED ORDER — OXYCODONE-ACETAMINOPHEN 10-325 MG PO TABS: 1.0000 | ORAL_TABLET | ORAL | 0 refills | Status: DC | PRN

## 2018-07-12 NOTE — Progress Notes (Signed)
Hematology and Oncology Follow Up Visit  Rodney [name] [id number] 1970/05/04 48 y.o. 07/12/2018 1:32 PM Rodney [name], MDBurdine, [name], MD   Principle Diagnosis: 48 year old man with:  1.  Prostate cancer diagnosed in July 2019.  His PSA was 5.58 with a Gleason score 3+3 = 6.  He is status post prostatectomy.  2.  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  He is status post cystectomy.  3.  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   Prior Therapy:  He is status post a robotic assisted laparoscopic radical cystoprostatectomy and bilateral pelvic lymphadenectomy and neobladder creation completed on November 08, 2017 by Rodney [name].  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  Prostate adenocarcinoma is 3+4 = 7 with 0 out of 14 lymph nodes noted.  Current therapy: Under further therapy related to his metastatic malignancy to the bone.  Interim History: Rodney [name] is a 48 year old man was seen in the prostate cancer multidisciplinary clinic for evaluation for diagnosis of both bladder and prostate cancer.  He underwent a cystoprostatectomy with successful surgery and recovery back in October 2019.    He was seen in the emergency department on 06/28/2018 for urinary retention and subsequently on 07/05/2018 for the same reason.  He had a CT scan of the abdomen and pelvis which did not show any abnormalities.  He presented to the emergency department on 07/07/2018 with complaint of lower back discomfort and MRI obtained at the time showed widespread metastatic disease throughout the lower thoracic spine and lumbar spine as well as the sacrum.  He had a 3 cm expansile mass in the left iliac bone without any pathological fractures.  He was started on Percocet and prednisone which have helped his pain slightly.  He is able to sleep and rest most of the night.  During the day his pain gets worse with increased activity.  He denies any  neurological deficits.  His urinary retention has resolved and his catheter has been removed.  He does report some constipation for which she takes MiraLAX.  He denies any respiratory complaints including cough or wheezing.  He denies any shortness of breath or difficulty breathing.  He denies any neurological complaints.  He does not report any headaches, blurry vision, syncope or seizures. Does not report any fevers, chills or sweats.  Does not report any cough, wheezing or hemoptysis.  Does not report any chest pain, palpitation, orthopnea or leg edema.  Does not report any nausea, vomiting or abdominal pain.  Does not report frequency, urgency or hematuria.  Does not report any skin rashes or lesions. Does not report any heat or cold intolerance.  Does not report any lymphadenopathy or petechiae.  Does not report any anxiety or depression.  Remaining review of systems is negative.    Medications: I have reviewed the patient's current medications.  Current Outpatient Medications  Medication Sig Dispense Refill  . amoxicillin-clavulanate (AUGMENTIN) 875-125 MG tablet Take 1 tablet by mouth 2 (two) times daily. 7 day course starting on 06/30/2018    . cyanocobalamin (,VITAMIN B-12,) 1000 MCG/ML injection Inject 1,000 mcg into the muscle every 30 (thirty) days.    . Multiple Vitamin (MULTIVITAMIN WITH MINERALS) TABS tablet Take 1 tablet by mouth daily.    . naproxen sodium (ALEVE) 220 MG tablet Take 220 mg by mouth daily as needed (for pain).    [name] oxyCODONE-acetaminophen (PERCOCET) 10-325 MG tablet Take 1 tablet by  mouth every 4 (four) hours as needed for pain. 30 tablet 0  . predniSONE (DELTASONE) 10 MG tablet Take 2 tablets (20 mg total) by mouth daily. 10 tablet 0  . sildenafil (VIAGRA) 100 MG tablet Take 100 mg by mouth daily as needed.    . vitamin B-12 (CYANOCOBALAMIN) 1000 MCG tablet Take 1,000 mcg by mouth 2 (two) times a day.     No current facility-administered medications for this visit.       Allergies:  Allergies  Allergen Reactions  . Morphine And Related Itching  . Vicodin [Hydrocodone-Acetaminophen] Itching    Past Medical History, Surgical history, Social history, and Family History were reviewed and updated.    Physical Exam: Blood pressure 125/89, pulse 88, temperature 99.1 F (37.3 C), temperature source Oral, resp. rate 18, height 6\' 2"  (1.88 m), weight 189 lb 14.4 oz (86.1 kg), SpO2 98 %.   ECOG: 1 General appearance: alert and cooperative appeared without distress. Head: Normocephalic, without obvious abnormality Oropharynx: No oral thrush or ulcers. Eyes: No scleral icterus.  Pupils are equal and round reactive to light. Lymph nodes: Cervical, supraclavicular, and axillary nodes normal. Heart:regular rate and rhythm, S1, S2 normal, no murmur, click, rub or gallop Lung:chest clear, no wheezing, rales, normal symmetric air entry Abdomin: soft, non-tender, without masses or organomegaly. Neurological: No motor, sensory deficits.  Intact deep tendon reflexes.  He is able to ambulate without any major difficulties. Skin: No rashes or lesions.  No ecchymosis or petechiae. Musculoskeletal: No joint deformity or effusion. Psychiatric: Mood and affect are appropriate.    Lab Results: Lab Results  Component Value Date   WBC 12.0 (H) 07/04/2018   HGB 12.3 (L) 07/04/2018   HCT 38.0 (L) 07/04/2018   MCV 100.8 (H) 07/04/2018   PLT 354 07/04/2018     Chemistry      Component Value Date/Time   NA 138 07/04/2018 2324   K 4.5 07/04/2018 2324   CL 102 07/04/2018 2324   CO2 27 07/04/2018 2324   BUN 24 (H) 07/04/2018 2324   CREATININE 0.92 07/04/2018 2324      Component Value Date/Time   CALCIUM 10.0 07/04/2018 2324   ALKPHOS 124 07/04/2018 2324   AST 15 07/04/2018 2324   ALT 15 07/04/2018 2324   BILITOT 0.2 (L) 07/04/2018 2324       Radiological Studies:  IMPRESSION: Widespread metastatic disease throughout the lower thoracic spine, tire  lumbar spine, and sacrum. 3 cm expansile mass left iliac bone.   Flank pain.  EXAM: CT ABDOMEN AND PELVIS WITHOUT CONTRAST  TECHNIQUE: Multidetector CT imaging of the abdomen and pelvis was performed following the standard protocol without IV contrast.  COMPARISON:  PET CT 09/25/2017.  CT abdomen and pelvis 06/18/2017.  FINDINGS: Lower chest: Mild wall thickening in the lingula with airspace opacity, likely atelectasis. No effusions. Heart is normal size.  Hepatobiliary: No focal hepatic abnormality. Gallbladder unremarkable.  Pancreas: No focal abnormality or ductal dilatation.  Spleen: No focal abnormality.  Normal size.  Adrenals/Urinary Tract: Multiple bilateral renal cysts. No hydronephrosis. No renal or ureteral stones. Adrenal glands unremarkable. Foley catheter present in the neobladder which is grossly unremarkable.  Stomach/Bowel: Postoperative changes in the right lower quadrant. Moderate stool burden in the colon. No evidence of a bowel obstruction.  Vascular/Lymphatic: Aortic atherosclerosis. No enlarged abdominal or pelvic lymph nodes.  Reproductive: No visible focal abnormality.  Other: No free fluid or free air.  Musculoskeletal: Postoperative changes in the right pelvis. No acute bony  abnormality.  IMPRESSION: Foley catheter in decompressed neobladder.  No hydronephrosis.  Bilateral renal cysts are stable since prior study.  Moderate stool in the colon.  Aortic atherosclerosis.  No acute findings.   No pathologic fracture  Lower lumbar degenerative change as above. Prior laminectomy at L3-4, L4-5, L5-S1.     Impression and Plan:   48 year old with the following:  1.  Metastatic malignancy with bone involvement noted on MRI of the spine on 07/07/2018.  He has widespread disease in the thoracic spine as well as lumbar spine and sacrum.  He has also a 3 cm expansile mass in the left iliac bone.  His case was  discussed in the GU tumor board with discussion with Rodney [name] as well as radiology.  These findings are concerning for advanced malignancy although it would be unlikely to be of a prostate primary given his low disease volume as well as comprehensive surgery that he is received.  These findings are also new compared to PET/CT scan that was done in September 2019.  The differential diagnosis would include metastatic bladder cancer, primary lung neoplasm, or cancer of unknown primary.  From a management standpoint, I recommended completing staging with CT scan chest abdomen pelvis which is scheduled by Rodney [name] in the near future.  We also discussed obtaining tissue biopsy from his spine and iliac lesion appears to be amendable.  He is agreeable to proceed with this at this time.  After the results of the pathology is available, we will discuss treatment options moving forward.  He will likely require systemic therapy will be in the form of systemic chemotherapy, immunotherapy or combination.  It is unlikely that we are dealing with a benign process.  2.  Bone pain: Prescription for Percocet and redness on was refilled at least for the time being.  I will also refer him to Dr. Tammi Klippel for palliative radiation therapy of the spine.  3.  Follow-up: We will be in the near future after completing his staging work-up and biopsy.  40  minutes was spent with the patient face-to-face today.  More than 50% of time was dedicated to reviewing his disease status, imaging studies, differential diagnosis and management options.    Zola Button, MD 6/26/20201:32 PM

## 2018-07-15 ENCOUNTER — Telehealth: Payer: Self-pay | Admitting: Radiation Oncology

## 2018-07-15 NOTE — Telephone Encounter (Signed)
Contacted pt to verify webex visit for pre reg °

## 2018-07-15 NOTE — Progress Notes (Signed)
Histology and Location of Primary Cancer: Status post a robotic assisted laparoscopic radical cystoprostatectomy and bilateral pelvic lymphadenectomy and neobladder creation completed on November 08, 2017 by Dr. Alinda Money.  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  Prostate adenocarcinoma is 3+4 = 7 with 0 out of 14 lymph nodes noted.  Sites of Visceral and Bony Metastatic Disease: bony-low thoracic spine, lumbar spine, sacrum, 3 cm left iliac bone  Location(s) of Symptomatic Metastases: spine  Past/Anticipated chemotherapy by medical oncology, if any: No prior chemotherapy   Pain on a scale of 0-10 is: Low back pain. Right side flank pain. Worse with activity. Taking percocet and prednisone.   If Spine Met(s), symptoms, if any, include:  Bowel/Bladder retention or incontinence (please describe):   Numbness or weakness in extremities (please describe):   Current Decadron regimen, if applicable:   Ambulatory status? Walker? Wheelchair?: Ambulatory  SAFETY ISSUES:  Prior radiation?   Pacemaker/ICD?   Possible current pregnancy? no, male patient  Is the patient on methotrexate? no  Current Complaints / other details:  48 year old male. Originally consulted in [facility] on 09/10/2017. Presented to ED on 06/28/2018 with urinary retention and again on 07/05/2018. Patient returned again to ED on 07/07/2018 with low back pain.   APPOINTMENT CANCELLED BY ASHLYN. PATIENT URGENTLY REFERRED TO [facility]. SHIRLEY CONTACTED PATIENT.

## 2018-07-16 ENCOUNTER — Telehealth: Payer: Self-pay | Admitting: Oncology

## 2018-07-16 ENCOUNTER — Telehealth: Payer: Self-pay

## 2018-07-16 ENCOUNTER — Encounter: Payer: Self-pay | Admitting: Medical Oncology

## 2018-07-16 ENCOUNTER — Ambulatory Visit
Admission: RE | Admit: 2018-07-16 | Discharge: 2018-07-16 | Disposition: A | Discharge status: Home / Self Care | Payer: PRIVATE HEALTH INSURANCE | Source: Ambulatory Visit | Attending: Radiation Oncology | Admitting: Radiation Oncology

## 2018-07-16 ENCOUNTER — Other Ambulatory Visit: Payer: Self-pay | Admitting: Urology

## 2018-07-16 ENCOUNTER — Ambulatory Visit: Payer: PRIVATE HEALTH INSURANCE

## 2018-07-16 ENCOUNTER — Telehealth: Payer: Self-pay | Admitting: Urology

## 2018-07-16 ENCOUNTER — Other Ambulatory Visit: Payer: Self-pay | Admitting: Oncology

## 2018-07-16 DIAGNOSIS — C7951 Secondary malignant neoplasm of bone: Secondary | ICD-10-CM

## 2018-07-16 DIAGNOSIS — C61 Malignant neoplasm of prostate: Secondary | ICD-10-CM

## 2018-07-16 DIAGNOSIS — C689 Malignant neoplasm of urinary organ, unspecified: Secondary | ICD-10-CM

## 2018-07-16 NOTE — Telephone Encounter (Signed)
Called and spoke with patient. Confirmed appt  °

## 2018-07-16 NOTE — Telephone Encounter (Signed)
I called and spoke with Rodney [name] by phone prior to our scheduled consult visit this afternoon to obtain additional information.  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  More recently, he developed severe mid to low back pain and acute urinary retention requiring multiple visits to the emergency room.  Recent MRI scans show diffuse osseous metastatic disease throughout the thoracic and lumbar spine as well as a 3 cm expansile metastasis in the left iliac bone.  His case was discussed at recent multidisciplinary urology conference and recommendations are to proceed with disease restaging with CT chest, abdomen and pelvis as well as a biopsy of the iliac and/or spinal lesion(s) for tissue confirmation.  He is scheduled for CT imaging at [facility] urology with Dr. Alinda Money on 07/30/2018 and has not yet been scheduled for the CT biopsy.  However, he reports that his pain is very poorly controlled on prednisone and Percocet.  The pain is severely limiting his mobility and ability to participate in daily ADLs.  I have discussed his case with Dr. Tammi Klippel who recommends proceeding with palliative radiation to the painful disease in the spine without delay while awaiting restaging scans and tissue biopsy.  In speaking with the patient, he expresses concerns for daily trips over for XRT since he lives close to the [place], in [place], [place].  Therefore, we will make arrangements for him to have a consultation with Dr. Francesca Jewett at [facility], ASAP for consideration of palliative radiation to the spine closer to home in [place].  He is quite grateful for this consideration and eager to start treatment to obtain better pain control and improved quality of life.  In the interim, he will continue taking prednisone and Percocet as  prescribed.  I have placed the referral for urgent consult and spoken personally with Dr. Francesca Jewett who will make arrangements to get the patient in to be seen and treated in the very near future.  Nicholos Johns, MMS, PA-C [facility] at [place]: [phone]  Fax: [phone]

## 2018-07-16 NOTE — Telephone Encounter (Signed)
Records have been faxed to [facility] in [place] at [phone]

## 2018-07-17 ENCOUNTER — Other Ambulatory Visit: Payer: Self-pay | Admitting: Radiology

## 2018-07-18 ENCOUNTER — Other Ambulatory Visit: Payer: Self-pay | Admitting: Radiology

## 2018-07-20 ENCOUNTER — Emergency Department (HOSPITAL_COMMUNITY): Payer: PRIVATE HEALTH INSURANCE

## 2018-07-20 ENCOUNTER — Inpatient Hospital Stay (HOSPITAL_COMMUNITY)
Admission: EM | Admit: 2018-07-20 | Discharge: 2018-08-02 | DRG: 854 | Disposition: A | Discharge status: Home Health | Payer: PRIVATE HEALTH INSURANCE | Attending: Internal Medicine | Admitting: Internal Medicine

## 2018-07-20 ENCOUNTER — Encounter (HOSPITAL_COMMUNITY): Payer: Self-pay | Admitting: Emergency Medicine

## 2018-07-20 ENCOUNTER — Other Ambulatory Visit: Payer: Self-pay

## 2018-07-20 DIAGNOSIS — Z87891 Personal history of nicotine dependence: Secondary | ICD-10-CM

## 2018-07-20 DIAGNOSIS — C7951 Secondary malignant neoplasm of bone: Secondary | ICD-10-CM | POA: Diagnosis present

## 2018-07-20 DIAGNOSIS — G893 Neoplasm related pain (acute) (chronic): Secondary | ICD-10-CM | POA: Diagnosis present

## 2018-07-20 DIAGNOSIS — R7303 Prediabetes: Secondary | ICD-10-CM | POA: Diagnosis present

## 2018-07-20 DIAGNOSIS — C799 Secondary malignant neoplasm of unspecified site: Secondary | ICD-10-CM

## 2018-07-20 DIAGNOSIS — F329 Major depressive disorder, single episode, unspecified: Secondary | ICD-10-CM | POA: Diagnosis present

## 2018-07-20 DIAGNOSIS — Z79899 Other long term (current) drug therapy: Secondary | ICD-10-CM | POA: Diagnosis not present

## 2018-07-20 DIAGNOSIS — Z8546 Personal history of malignant neoplasm of prostate: Secondary | ICD-10-CM

## 2018-07-20 DIAGNOSIS — Z923 Personal history of irradiation: Secondary | ICD-10-CM

## 2018-07-20 DIAGNOSIS — E860 Dehydration: Secondary | ICD-10-CM | POA: Diagnosis present

## 2018-07-20 DIAGNOSIS — F419 Anxiety disorder, unspecified: Secondary | ICD-10-CM | POA: Diagnosis present

## 2018-07-20 DIAGNOSIS — C787 Secondary malignant neoplasm of liver and intrahepatic bile duct: Secondary | ICD-10-CM | POA: Diagnosis present

## 2018-07-20 DIAGNOSIS — Z808 Family history of malignant neoplasm of other organs or systems: Secondary | ICD-10-CM

## 2018-07-20 DIAGNOSIS — Z1159 Encounter for screening for other viral diseases: Secondary | ICD-10-CM | POA: Diagnosis not present

## 2018-07-20 DIAGNOSIS — Z8042 Family history of malignant neoplasm of prostate: Secondary | ICD-10-CM | POA: Diagnosis not present

## 2018-07-20 DIAGNOSIS — N390 Urinary tract infection, site not specified: Secondary | ICD-10-CM | POA: Diagnosis present

## 2018-07-20 DIAGNOSIS — Z800 Family history of malignant neoplasm of digestive organs: Secondary | ICD-10-CM | POA: Diagnosis not present

## 2018-07-20 DIAGNOSIS — M79609 Pain in unspecified limb: Secondary | ICD-10-CM

## 2018-07-20 DIAGNOSIS — K5903 Drug induced constipation: Secondary | ICD-10-CM | POA: Diagnosis not present

## 2018-07-20 DIAGNOSIS — M8458XA Pathological fracture in neoplastic disease, other specified site, initial encounter for fracture: Secondary | ICD-10-CM | POA: Diagnosis present

## 2018-07-20 DIAGNOSIS — R0902 Hypoxemia: Secondary | ICD-10-CM | POA: Diagnosis present

## 2018-07-20 DIAGNOSIS — D72829 Elevated white blood cell count, unspecified: Secondary | ICD-10-CM | POA: Diagnosis present

## 2018-07-20 DIAGNOSIS — T402X5A Adverse effect of other opioids, initial encounter: Secondary | ICD-10-CM | POA: Diagnosis present

## 2018-07-20 DIAGNOSIS — T380X5A Adverse effect of glucocorticoids and synthetic analogues, initial encounter: Secondary | ICD-10-CM | POA: Diagnosis present

## 2018-07-20 DIAGNOSIS — Z8551 Personal history of malignant neoplasm of bladder: Secondary | ICD-10-CM

## 2018-07-20 DIAGNOSIS — I472 Ventricular tachycardia: Secondary | ICD-10-CM | POA: Diagnosis not present

## 2018-07-20 DIAGNOSIS — K219 Gastro-esophageal reflux disease without esophagitis: Secondary | ICD-10-CM | POA: Diagnosis present

## 2018-07-20 DIAGNOSIS — Z79891 Long term (current) use of opiate analgesic: Secondary | ICD-10-CM

## 2018-07-20 DIAGNOSIS — R739 Hyperglycemia, unspecified: Secondary | ICD-10-CM | POA: Diagnosis present

## 2018-07-20 DIAGNOSIS — K5900 Constipation, unspecified: Secondary | ICD-10-CM | POA: Diagnosis present

## 2018-07-20 DIAGNOSIS — Z7952 Long term (current) use of systemic steroids: Secondary | ICD-10-CM | POA: Diagnosis not present

## 2018-07-20 DIAGNOSIS — M545 Low back pain: Secondary | ICD-10-CM | POA: Diagnosis present

## 2018-07-20 DIAGNOSIS — R0602 Shortness of breath: Secondary | ICD-10-CM

## 2018-07-20 DIAGNOSIS — R52 Pain, unspecified: Secondary | ICD-10-CM | POA: Diagnosis not present

## 2018-07-20 DIAGNOSIS — Z66 Do not resuscitate: Secondary | ICD-10-CM | POA: Diagnosis present

## 2018-07-20 DIAGNOSIS — A419 Sepsis, unspecified organism: Secondary | ICD-10-CM | POA: Diagnosis not present

## 2018-07-20 DIAGNOSIS — C801 Malignant (primary) neoplasm, unspecified: Secondary | ICD-10-CM

## 2018-07-20 DIAGNOSIS — M62838 Other muscle spasm: Secondary | ICD-10-CM | POA: Diagnosis present

## 2018-07-20 DIAGNOSIS — Z7189 Other specified counseling: Secondary | ICD-10-CM

## 2018-07-20 DIAGNOSIS — C61 Malignant neoplasm of prostate: Secondary | ICD-10-CM

## 2018-07-20 DIAGNOSIS — E875 Hyperkalemia: Secondary | ICD-10-CM | POA: Diagnosis present

## 2018-07-20 LAB — URINALYSIS, ROUTINE W REFLEX MICROSCOPIC
Bilirubin Urine: NEGATIVE
Glucose, UA: NEGATIVE mg/dL
Ketones, ur: NEGATIVE mg/dL
Nitrite: NEGATIVE
Protein, ur: NEGATIVE mg/dL
Specific Gravity, Urine: 1.01 (ref 1.005–1.030)
WBC, UA: >50 WBC/hpf — ABNORMAL HIGH (ref 0–5)
pH: 7 (ref 5.0–8.0)

## 2018-07-20 LAB — BASIC METABOLIC PANEL
Anion gap: 13 (ref 5–15)
BUN: 25 mg/dL — ABNORMAL HIGH (ref 6–20)
CO2: 26 mmol/L (ref 22–32)
Calcium: 10.4 mg/dL — ABNORMAL HIGH (ref 8.9–10.3)
Chloride: 96 mmol/L — ABNORMAL LOW (ref 98–111)
Creatinine, Ser: 0.93 mg/dL (ref 0.61–1.24)
GFR calc Af Amer: >60 mL/min (ref >60)
GFR calc non Af Amer: >60 mL/min (ref >60)
Glucose, Bld: 129 mg/dL — ABNORMAL HIGH (ref 70–99)
Potassium: 4.3 mmol/L (ref 3.5–5.1)
Sodium: 135 mmol/L (ref 135–145)

## 2018-07-20 LAB — CBC WITH DIFFERENTIAL/PLATELET
Abs Immature Granulocytes: 0.06 10*3/uL (ref 0.00–0.07)
Basophils Absolute: 0 10*3/uL (ref 0.0–0.1)
Basophils Relative: 0 %
Eosinophils Absolute: 0.1 10*3/uL (ref 0.0–0.5)
Eosinophils Relative: 1 %
HCT: 41.5 % (ref 39.0–52.0)
Hemoglobin: 13.1 g/dL (ref 13.0–17.0)
Immature Granulocytes: 1 %
Lymphocytes Relative: 7 %
Lymphs Abs: 0.9 10*3/uL (ref 0.7–4.0)
MCH: 31.6 pg (ref 26.0–34.0)
MCHC: 31.6 g/dL (ref 30.0–36.0)
MCV: 100.2 fL — ABNORMAL HIGH (ref 80.0–100.0)
Monocytes Absolute: 1.1 10*3/uL — ABNORMAL HIGH (ref 0.1–1.0)
Monocytes Relative: 9 %
Neutro Abs: 10.1 10*3/uL — ABNORMAL HIGH (ref 1.7–7.7)
Neutrophils Relative %: 82 %
Platelets: 388 10*3/uL (ref 150–400)
RBC: 4.14 MIL/uL — ABNORMAL LOW (ref 4.22–5.81)
RDW: 11.6 % (ref 11.5–15.5)
WBC: 12.2 10*3/uL — ABNORMAL HIGH (ref 4.0–10.5)
nRBC: 0 % (ref 0.0–0.2)

## 2018-07-20 LAB — HEPATIC FUNCTION PANEL
ALT: 19 U/L (ref 0–44)
AST: 14 U/L — ABNORMAL LOW (ref 15–41)
Albumin: 2.8 g/dL — ABNORMAL LOW (ref 3.5–5.0)
Alkaline Phosphatase: 129 U/L — ABNORMAL HIGH (ref 38–126)
Bilirubin, Direct: <0.1 mg/dL (ref 0.0–0.2)
Total Bilirubin: 0.6 mg/dL (ref 0.3–1.2)
Total Protein: 6.9 g/dL (ref 6.5–8.1)

## 2018-07-20 LAB — PROTIME-INR
INR: 1.1 (ref 0.8–1.2)
Prothrombin Time: 13.7 seconds (ref 11.4–15.2)

## 2018-07-20 LAB — LIPASE, BLOOD: Lipase: 20 U/L (ref 11–51)

## 2018-07-20 LAB — TROPONIN I (HIGH SENSITIVITY)
Troponin I (High Sensitivity): <2 ng/L (ref <18)
Troponin I (High Sensitivity): 2 ng/L (ref <18)

## 2018-07-20 LAB — SARS CORONAVIRUS 2 BY RT PCR (HOSPITAL ORDER, PERFORMED IN ~~LOC~~ HOSPITAL LAB): SARS Coronavirus 2: NEGATIVE

## 2018-07-20 MED ORDER — DEXAMETHASONE SODIUM PHOSPHATE 4 MG/ML IJ SOLN
4.0000 mg | Freq: Once | INTRAMUSCULAR | Status: AC
  Administered 2018-07-20: 4 mg via INTRAVENOUS
  Filled 2018-07-20: qty 1

## 2018-07-20 MED ORDER — SODIUM CHLORIDE 0.9 % IV SOLN
1.0000 g | Freq: Once | INTRAVENOUS | Status: AC
  Administered 2018-07-20: 1 g via INTRAVENOUS
  Filled 2018-07-20: qty 10

## 2018-07-20 MED ORDER — SODIUM CHLORIDE 0.9% FLUSH: 9.0000 mL | INTRAVENOUS | Status: DC | PRN

## 2018-07-20 MED ORDER — ENOXAPARIN SODIUM 40 MG/0.4ML ~~LOC~~ SOLN
40.0000 mg | Freq: Every day | SUBCUTANEOUS | Status: DC
  Administered 2018-07-21: 40 mg via SUBCUTANEOUS
  Filled 2018-07-20: qty 0.4

## 2018-07-20 MED ORDER — NALOXONE HCL 0.4 MG/ML IJ SOLN: 0.4000 mg | INTRAMUSCULAR | Status: DC | PRN

## 2018-07-20 MED ORDER — HYDROMORPHONE HCL 1 MG/ML IJ SOLN: 1.0000 mg | INTRAMUSCULAR | Status: DC | PRN

## 2018-07-20 MED ORDER — ACETAMINOPHEN 650 MG RE SUPP: 650.0000 mg | Freq: Four times a day (QID) | RECTAL | Status: DC | PRN

## 2018-07-20 MED ORDER — HYDROMORPHONE HCL 1 MG/ML IJ SOLN
1.0000 mg | INTRAMUSCULAR | Status: AC | PRN
  Administered 2018-07-21: 1 mg via INTRAVENOUS
  Filled 2018-07-20: qty 1

## 2018-07-20 MED ORDER — PREDNISONE 20 MG PO TABS
20.0000 mg | ORAL_TABLET | Freq: Every day | ORAL | Status: DC
  Administered 2018-07-21: 20 mg via ORAL
  Filled 2018-07-20: qty 1

## 2018-07-20 MED ORDER — DIPHENHYDRAMINE HCL 50 MG/ML IJ SOLN: 12.5000 mg | Freq: Four times a day (QID) | INTRAMUSCULAR | Status: DC | PRN

## 2018-07-20 MED ORDER — FENTANYL CITRATE (PF) 100 MCG/2ML IJ SOLN
50.0000 ug | INTRAMUSCULAR | Status: AC | PRN
  Administered 2018-07-20 (×2): 50 ug via INTRAVENOUS
  Filled 2018-07-20 (×2): qty 2

## 2018-07-20 MED ORDER — HYDROMORPHONE HCL 1 MG/ML IJ SOLN
0.5000 mg | Freq: Once | INTRAMUSCULAR | Status: AC
  Administered 2018-07-20: 0.5 mg via INTRAVENOUS
  Filled 2018-07-20: qty 1

## 2018-07-20 MED ORDER — SODIUM CHLORIDE 0.9 % IV SOLN
1.0000 g | INTRAVENOUS | Status: DC
  Administered 2018-07-21 – 2018-07-23 (×3): 1 g via INTRAVENOUS
  Filled 2018-07-20 (×3): qty 10
  Filled 2018-07-20: qty 1

## 2018-07-20 MED ORDER — ONDANSETRON HCL 4 MG/2ML IJ SOLN
4.0000 mg | Freq: Four times a day (QID) | INTRAMUSCULAR | Status: DC | PRN
  Administered 2018-07-22 – 2018-07-30 (×5): 4 mg via INTRAVENOUS
  Filled 2018-07-20 (×5): qty 2

## 2018-07-20 MED ORDER — ONDANSETRON HCL 4 MG PO TABS: 4.0000 mg | ORAL_TABLET | Freq: Four times a day (QID) | ORAL | Status: DC | PRN

## 2018-07-20 MED ORDER — GADOBUTROL 1 MMOL/ML IV SOLN
8.0000 mL | Freq: Once | INTRAVENOUS | Status: AC | PRN
  Administered 2018-07-20: 20:00:00 8 mL via INTRAVENOUS

## 2018-07-20 MED ORDER — ALBUTEROL SULFATE (2.5 MG/3ML) 0.083% IN NEBU: 2.5000 mg | INHALATION_SOLUTION | RESPIRATORY_TRACT | Status: DC | PRN

## 2018-07-20 MED ORDER — ACETAMINOPHEN 325 MG PO TABS
650.0000 mg | ORAL_TABLET | Freq: Four times a day (QID) | ORAL | Status: DC | PRN
  Administered 2018-07-21 – 2018-07-29 (×2): 650 mg via ORAL
  Filled 2018-07-20 (×2): qty 2

## 2018-07-20 MED ORDER — SODIUM CHLORIDE 0.9% FLUSH
3.0000 mL | Freq: Two times a day (BID) | INTRAVENOUS | Status: DC
  Administered 2018-07-21 – 2018-07-31 (×12): 3 mL via INTRAVENOUS

## 2018-07-20 MED ORDER — SORBITOL 70 % SOLN
960.0000 mL | TOPICAL_OIL | Freq: Once | ORAL | Status: AC
  Administered 2018-07-21: 960 mL via RECTAL
  Filled 2018-07-20: qty 473

## 2018-07-20 MED ORDER — OXYCODONE HCL 5 MG PO TABS: 10.0000 mg | ORAL_TABLET | ORAL | Status: DC | PRN

## 2018-07-20 MED ORDER — HYDROMORPHONE 1 MG/ML IV SOLN
INTRAVENOUS | Status: DC
  Administered 2018-07-21: 30 mg via INTRAVENOUS
  Filled 2018-07-20: qty 30

## 2018-07-20 MED ORDER — OXYCODONE HCL ER 10 MG PO T12A
10.0000 mg | EXTENDED_RELEASE_TABLET | Freq: Two times a day (BID) | ORAL | Status: DC
  Administered 2018-07-21: 10 mg via ORAL
  Filled 2018-07-20: qty 1

## 2018-07-20 MED ORDER — DIPHENHYDRAMINE HCL 12.5 MG/5ML PO ELIX: 12.5000 mg | ORAL_SOLUTION | Freq: Four times a day (QID) | ORAL | Status: DC | PRN

## 2018-07-20 MED ORDER — SENNOSIDES-DOCUSATE SODIUM 8.6-50 MG PO TABS: 1.0000 | ORAL_TABLET | Freq: Two times a day (BID) | ORAL | Status: DC

## 2018-07-20 MED ORDER — IOHEXOL 300 MG/ML  SOLN
100.0000 mL | Freq: Once | INTRAMUSCULAR | Status: AC | PRN
  Administered 2018-07-20: 100 mL via INTRAVENOUS

## 2018-07-20 NOTE — ED Notes (Signed)
Report given to [facility]. 

## 2018-07-20 NOTE — ED Notes (Signed)
ED TO INPATIENT HANDOFF REPORT  ED Nurse Name and Phone #: Clayton Bibles [phone]  S Name/Age/Gender Rodney [name] 48 y.o. male Room/Bed: APA09/APA09  Code Status   Code Status: Prior  Home/SNF/Other Home Patient oriented to: self, place, time and situation Is this baseline? Yes   Triage Complete: Triage complete  Chief Complaint Chest Pain  Triage Note Pt has cancer of the spine he is hurting in his back chest and going down his legs.    Allergies Allergies  Allergen Reactions  . Morphine And Related Itching  . Vicodin [Hydrocodone-Acetaminophen] Itching    Level of Care/Admitting Diagnosis ED Disposition    ED Disposition Condition Comment   Transfer to Another Facility  The patient appears reasonably stabilized for transfer considering the current resources, flow, and capabilities available in the ED at this time, and I doubt any other [facility] requiring further screening and/or treatment in the ED prior to transfer is p resent.       B Medical/Surgery History Past Medical History:  Diagnosis Date  . Anxiety   . Bladder tumor   . Blood in urine    saw some 3 days ago but none inthe last 2 days   . Bradycardia   . Cancer ([place])   . Depression   . Dizziness    historical   . Elevated PSA   . Family history of colon cancer   . Family history of gastric cancer   . Family history of melanoma   . Family history of prostate cancer   . Heartburn    occasional   . Pain with urination   . Prostate cancer ([place])   . Urothelial cancer ([place])   . 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 BACK SURGERY  2012   3 disc; dr Carloyn Manner  did first and dr elsner did the last 2   . CYSTOSCOPY W/ RETROGRADES N/A 07/02/2017   Procedure: CYSTOSCOPY WITH RETROGRADE PYELOGRAM/EXAM UNDER ANESTHESIA;  Surgeon: Raynelle Bring, MD;  Location: [facility];  Service: Urology;  Laterality: N/A;  . CYSTOSCOPY WITH BIOPSY N/A 08/09/2017   Procedure:  CYSTOSCOPY WITH PROSTATE NEEDLE BIOPSY;  Surgeon: Raynelle Bring, MD;  Location: [facility];  Service: Urology;  Laterality: N/A;  GENERAL ANESTHESIA WITH PARALYSIS/ ONLY NEEDS 60 MIN FOR ALL PROCEDURES  . LEG SURGERY  2012   4 metal plates in plates   . TRANSRECTAL ULTRASOUND N/A 08/09/2017   Procedure: TRANSRECTAL ULTRASOUND;  Surgeon: Raynelle Bring, MD;  Location: [facility];  Service: Urology;  Laterality: N/A;  ONLY NEEDS 60 MIN FOR ALL PROCEDURES  . TRANSURETHRAL RESECTION OF BLADDER TUMOR N/A 07/02/2017   Procedure: TRANSURETHRAL RESECTION OF BLADDER TUMOR (TURBT);  Surgeon: Raynelle Bring, MD;  Location: [facility];  Service: Urology;  Laterality: N/A;  . TRANSURETHRAL RESECTION OF BLADDER TUMOR N/A 08/09/2017   Procedure: TRANSURETHRAL RESECTION OF BLADDER TUMOR (TURBT);  Surgeon: Raynelle Bring, MD;  Location: [facility];  Service: Urology;  Laterality: N/A;     A IV Location/Drains/Wounds Patient Lines/Drains/Airways Status   Active Line/Drains/Airways    Name:   Placement date:   Placement time:   Site:   Days:   Peripheral IV 07/20/18 Right Antecubital   07/20/18    1053    Antecubital   less than 1   Urethral Catheter DR. BORDEN Latex;Triple-lumen 24 Fr.   11/08/17    1430    Latex;Triple-lumen   254   Ureteral Drain/Stent Left  ureter 7 Fr.   11/08/17    1412    Left ureter   254   Ureteral Drain/Stent Right ureter 7 Fr.   11/08/17    1414    Right ureter   254   Incision (Closed) 11/08/17 Abdomen   11/08/17    1449     254   Incision - 3 Ports Abdomen 1: Left;Lateral 2: Right;Lateral 3: Right;Upper;Lateral   11/08/17    0810     254   Pressure Injury 11/08/17 Stage I -  Intact skin with non-blanchable redness of a localized area usually over a bony prominence.   11/08/17    1730     254          Intake/Output Last 24 hours No intake or output data in the 24 hours ending 07/20/18 1230  Labs/Imaging Results for orders placed or performed during the hospital encounter of 07/20/18 (from the  past 48 hour(s))  Basic metabolic panel     Status: Abnormal   Collection Time: 07/20/18 10:53 AM  Result Value Ref Range   Sodium 135 135 - 145 mmol/L   Potassium 4.3 3.5 - 5.1 mmol/L   Chloride 96 (L) 98 - 111 mmol/L   CO2 26 22 - 32 mmol/L   Glucose, Bld 129 (H) 70 - 99 mg/dL   BUN 25 (H) 6 - 20 mg/dL   Creatinine, Ser 0.93 0.61 - 1.24 mg/dL   Calcium 10.4 (H) 8.9 - 10.3 mg/dL   GFR calc non Af Amer >60 >60 mL/min   GFR calc Af Amer >60 >60 mL/min   Anion gap 13 5 - 15    Comment: Performed at [facility], [address], [place], [place] [address]  Troponin I (High Sensitivity)     Status: None   Collection Time: 07/20/18 10:53 AM  Result Value Ref Range   Troponin I (High Sensitivity) <2.0 <18 ng/L    Comment: Performed at [facility], [address] [place], [place] [address]  CBC with Differential     Status: Abnormal   Collection Time: 07/20/18 10:53 AM  Result Value Ref Range   WBC 12.2 (H) 4.0 - 10.5 K/uL   RBC 4.14 (L) 4.22 - 5.81 MIL/uL   Hemoglobin 13.1 13.0 - 17.0 g/dL   HCT 41.5 39.0 - 52.0 %   MCV 100.2 (H) 80.0 - 100.0 fL   MCH 31.6 26.0 - 34.0 pg   MCHC 31.6 30.0 - 36.0 g/dL   RDW 11.6 11.5 - 15.5 %   Platelets 388 150 - 400 K/uL   nRBC 0.0 0.0 - 0.2 %   Neutrophils Relative % 82 %   Neutro Abs 10.1 (H) 1.7 - 7.7 K/uL   Lymphocytes Relative 7 %   Lymphs Abs 0.9 0.7 - 4.0 K/uL   Monocytes Relative 9 %   Monocytes Absolute 1.1 (H) 0.1 - 1.0 K/uL   Eosinophils Relative 1 %   Eosinophils Absolute 0.1 0.0 - 0.5 K/uL   Basophils Relative 0 %   Basophils Absolute 0.0 0.0 - 0.1 K/uL   Immature Granulocytes 1 %   Abs Immature Granulocytes 0.06 0.00 - 0.07 K/uL    Comment: Performed at [facility], [address], [place], [place] [address]  Protime-INR     Status: None   Collection Time: 07/20/18 10:53 AM  Result Value Ref Range   Prothrombin Time 13.7 11.4 - 15.2 seconds   INR 1.1 0.8 - 1.2    Comment: (NOTE)  INR goal varies based on device and disease  states. Performed at [facility], [address], [place], [place] [address]   SARS Coronavirus 2 ([place] - Performed in [facility] hospital lab), Hosp Order     Status: None   Collection Time: 07/20/18 11:06 AM   Specimen: Nasopharyngeal Swab  Result Value Ref Range   SARS Coronavirus 2 NEGATIVE NEGATIVE    Comment: (NOTE) If result is NEGATIVE SARS-CoV-2 target nucleic acids are NOT DETECTED. The SARS-CoV-2 RNA is generally detectable in upper and lower  respiratory specimens during the acute phase of infection. The lowest  concentration of SARS-CoV-2 viral copies this assay can detect is 250  copies / mL. A negative result does not preclude SARS-CoV-2 infection  and should not be used as the sole basis for treatment or other  patient management decisions.  A negative result may occur with  improper specimen collection / handling, submission of specimen other  than nasopharyngeal swab, presence of viral mutation(s) within the  areas targeted by this assay, and inadequate number of viral copies  (<250 copies / mL). A negative result must be combined with clinical  observations, patient history, and epidemiological information. If result is POSITIVE SARS-CoV-2 target nucleic acids are DETECTED. The SARS-CoV-2 RNA is generally detectable in upper and lower  respiratory specimens dur ing the acute phase of infection.  Positive  results are indicative of active infection with SARS-CoV-2.  Clinical  correlation with patient history and other diagnostic information is  necessary to determine patient infection status.  Positive results do  not rule out bacterial infection or co-infection with other viruses. If result is PRESUMPTIVE POSTIVE SARS-CoV-2 nucleic acids MAY BE PRESENT.   A presumptive positive result was obtained on the submitted specimen  and confirmed on repeat testing.  While 2019 novel coronavirus  (SARS-CoV-2) nucleic acids may be present in the submitted sample   additional confirmatory testing may be necessary for epidemiological  and / or clinical management purposes  to differentiate between  SARS-CoV-2 and other Sarbecovirus currently known to infect humans.  If clinically indicated additional testing with an alternate test  methodology [phone]) is advised. The SARS-CoV-2 RNA is generally  detectable in upper and lower respiratory sp ecimens during the acute  phase of infection. The expected result is Negative. Fact Sheet for Patients:  StrictlyIdeas.no Fact Sheet for Healthcare Providers: BankingDealers.co.za This test is not yet approved or cleared by the [place] FDA and has been authorized for detection and/or diagnosis of SARS-CoV-2 by FDA under an Emergency Use Authorization (EUA).  This EUA will remain in effect (meaning this test can be used) for the duration of the COVID-19 declaration under Section 564(b)(1) of the Act, 21 U.S.C. section 360bbb-3(b)(1), unless the authorization is terminated or revoked sooner. Performed at [facility], [address], [place], [place] [address]    Dg Chest Port 1 View  Result Date: 07/20/2018 CLINICAL DATA:  Chest pain.  Metastatic bladder and prostate cancer. EXAM: PORTABLE CHEST 1 VIEW COMPARISON:  Chest x-ray dated November 12, 2017. FINDINGS: The heart size and mediastinal contours are within normal limits. Normal pulmonary vascularity. Low lung volumes with mild right basilar atelectasis. No acute osseous abnormality. No focal consolidation, pleural effusion, or pneumothorax. IMPRESSION: Mild right basilar atelectasis.  No active disease. Electronically Signed   By: Titus Dubin M.D.   On: 07/20/2018 11:31    Pending Labs [employer] (From admission, onward)    Start     Ordered  07/20/18 1041  Troponin I (High Sensitivity)  STAT Now then every 2 hours,   STAT     07/20/18 1040   07/20/18 1041  Urinalysis, Routine w reflex  microscopic  ONCE - STAT,   STAT     07/20/18 1040          Vitals/Pain Today's Vitals   07/20/18 1150 07/20/18 1200 07/20/18 1207 07/20/18 1215  BP:  (!) 130/98    Pulse:  (!) 106  (!) 103  Resp:  (!) 28  (!) 23  Temp:      TempSrc:      SpO2:  96%  95%  Weight:      Height:      PainSc: 10-Worst pain ever  9      Isolation Precautions No active isolations  Medications Medications  fentaNYL (SUBLIMAZE) injection 50 mcg (50 mcg Intravenous Given 07/20/18 1142)  dexamethasone (DECADRON) injection 4 mg (4 mg Intravenous Given 07/20/18 1146)    Mobility walks with person assist Low fall risk   Focused Assessments    R Recommendations: See Admitting Provider Note  Report given to:   Additional Notes:

## 2018-07-20 NOTE — ED Provider Notes (Signed)
48 yo M with a chief complaints of diffuse back pain.  This been an ongoing issue for him.  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  Patient was transferred here from [facility] for MRI of the spine and then admission to the hospital for pain control and evaluation by oncology and radiation oncology.  He has had 2 radiation treatments.  On my exam the patient has diffuse abdominal pain which the patient thinks is related to constipation.  He had a spasm of severe pain as he was getting examined.  We will add LFTs and lipase to the work-up.  CT scan of the abdomen pelvis with contrast.  MRI of the CT and L-spine.  CT scan with a large stool burden but no other acute issue.  Awaiting MRI.  Signed out to Langston Masker, PA-C please see her note for further details of care in the ED.   Deno Etienne, DO 07/20/18 1920

## 2018-07-20 NOTE — ED Provider Notes (Signed)
[facility] EMERGENCY DEPARTMENT Provider Note   CSN: [id number] Arrival date & time: 07/20/18  1013     History   Chief Complaint Chief Complaint  Patient presents with   pain management   Chest Pain   Back Pain    HPI Rodney [name] is a 48 y.o. male.     HPI  Pt was seen at 1030. Per pt, c/o gradual onset and worsening of persistent "entire back pains" for the past 2 weeks, worse over the past 1 week. States his back pain radiates around his ribs to the front of his chest, and this has been constant for the past week. Has been associated with generalized weakness. Pt states he has new dx metastatic disease to his spine 2 weeks ago, and is currently scheduled for biopsy of left iliac crest metastatic lesion on Monday (in 2 days). Pt states he has been taking PO pain meds, muscle relaxer and steroids without improvement. Pt states for the past 1-2 weeks he has been unable to lift his LLE "without using my left arm" because "it hurts my back too much." Pt also states he has been unable to lift his arms up without causing increased pain in his neck and back. Denies falls, no LOC/AMS, no slurred speech, no facial droop, no incont bowels/bladder, no palpitations, no SOB/cough, no abd pain, no N/V/D, no rash, no fevers.    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   Status post radical cystoprostatectomy 11/08/2017    Bladder cancer ([place]) 11/08/2017   Genetic testing 10/26/2017   Family history of prostate cancer    Family history of gastric cancer    Family history of melanoma    Family history of colon cancer    Urothelial cancer ([place])    Malignant neoplasm of prostate ([place]) 09/11/2017    Past Surgical History:  Procedure Laterality Date   BACK SURGERY  2012   3 disc; dr Carloyn Manner  did first and dr elsner did the last 2    CYSTOSCOPY W/ RETROGRADES N/A 07/02/2017   Procedure: CYSTOSCOPY WITH RETROGRADE PYELOGRAM/EXAM UNDER ANESTHESIA;  Surgeon: Raynelle Bring, MD;  Location: [facility];  Service: Urology;  Laterality: N/A;   CYSTOSCOPY WITH BIOPSY N/A 08/09/2017   Procedure: CYSTOSCOPY WITH PROSTATE NEEDLE BIOPSY;  Surgeon: Raynelle Bring, MD;  Location: [facility];  Service: Urology;  Laterality: N/A;  GENERAL ANESTHESIA WITH PARALYSIS/ ONLY NEEDS 60 MIN FOR ALL PROCEDURES   LEG SURGERY  2012   4 metal plates in plates    TRANSRECTAL ULTRASOUND N/A 08/09/2017   Procedure: TRANSRECTAL ULTRASOUND;  Surgeon: Raynelle Bring, MD;  Location: [facility];  Service: Urology;  Laterality: N/A;  ONLY NEEDS 60 MIN FOR  ALL PROCEDURES   TRANSURETHRAL RESECTION OF BLADDER TUMOR N/A 07/02/2017   Procedure: TRANSURETHRAL RESECTION OF BLADDER TUMOR (TURBT);  Surgeon: Raynelle Bring, MD;  Location: [facility];  Service: Urology;  Laterality: N/A;   TRANSURETHRAL RESECTION OF BLADDER TUMOR N/A 08/09/2017   Procedure: TRANSURETHRAL RESECTION OF BLADDER TUMOR (TURBT);  Surgeon: Raynelle Bring, MD;  Location: [facility];  Service: Urology;  Laterality: N/A;        Home Medications    Prior to Admission medications   Medication Sig Start Date End Date Taking? Authorizing Provider  Oxycodone HCl 10 MG TABS Take 1 tablet by mouth every 4 (four) hours as needed. 07/18/18  Yes [provider]  cyanocobalamin (,VITAMIN B-12,) 1000 MCG/ML injection Inject 1,000 mcg into the muscle every 30 (thirty) days.    [provider]  cyclobenzaprine (FLEXERIL) 10 MG tablet Take 10 mg by mouth 3 (three) times daily.    [provider]  dexamethasone (DECADRON) 4 MG tablet Take 1 tablet by mouth 2 (two) times a day. Take 1 tablet by mouth twice a day for seven days 07/18/18   [provider]  Multiple Vitamin (MULTIVITAMIN WITH MINERALS) TABS tablet Take 1 tablet by mouth daily.    [provider]  naproxen sodium (ALEVE) 220 MG tablet Take 220-440 mg by mouth 2 (two) times daily as needed.    [provider]  oxyCODONE-acetaminophen (PERCOCET) 10-325 MG tablet Take 1 tablet by mouth every 4 (four) hours as needed for pain. Patient taking differently: Take 1 tablet by mouth every 4 (four) hours.  07/12/18   [name], MD  OXYCONTIN 10 MG 12 hr tablet Take 1 tablet by mouth every 12 (twelve) hours. 07/18/18   [provider]  predniSONE (DELTASONE) 10 MG tablet Take 2 tablets (20 mg total) by mouth daily. 07/12/18   [name], MD  sildenafil (VIAGRA) 100 MG tablet Take 100 mg by mouth daily as needed. 03/22/18   [provider]  vitamin B-12 (CYANOCOBALAMIN) 1000 MCG tablet Take 1,000 mcg by mouth 2 (two) times a day.    [provider]    Family History Family History  Problem Relation Age of Onset   Heart attack Father    Hypertension Father    Diabetes Father    Heart failure Father    Hypertension Mother    Gastric cancer Sister 36       d. 94   Prostate cancer Brother 60   Melanoma Maternal Aunt    Stroke Maternal Uncle    Stroke Paternal Uncle    Melanoma Maternal Grandmother        dx under 75   Colon cancer Maternal Grandmother    Heart disease Maternal Grandfather    Heart attack Paternal Grandfather     Social History Social History   Tobacco Use   Smoking status: Former Smoker    Packs/day: 2.00    Years: 8.00    Pack years: 16.00    Types: Cigarettes    Quit date: 05/29/2016    Years since quitting: 2.1    Smokeless tobacco: Former Systems developer   Tobacco comment: [place] 2009  Substance Use Topics   Alcohol use: Not Currently   Drug use: Not Currently    Types: Marijuana    Comment: last use was 05-29-2017     Allergies   Morphine and related and Vicodin [hydrocodone-acetaminophen]   Review of Systems Review of Systems ROS: Statement: All systems negative except  as marked or noted in the HPI; Constitutional: Negative for fever and chills. ; ; Eyes: Negative for eye pain, redness and discharge. ; ; ENMT: Negative for ear pain, hoarseness, nasal congestion, sinus pressure and sore throat. ; ; Cardiovascular: Negative for chest pain, palpitations, diaphoresis, dyspnea and peripheral edema. ; ; Respiratory: Negative for cough, wheezing and stridor. ; ; Gastrointestinal: Negative for nausea, vomiting, diarrhea, abdominal pain, blood in stool, hematemesis, jaundice and rectal bleeding. . ; ; Genitourinary: Negative for dysuria, flank pain and hematuria. ; ; Musculoskeletal: +back pain, neck pain, left hip pain. Negative for swelling and trauma.; ; Skin: Negative for pruritus, rash, abrasions, blisters, bruising and skin lesion.; ; Neuro: Negative for headache, lightheadedness and neck stiffness. Negative for weakness, altered level of consciousness, altered mental status, extremity weakness, paresthesias, involuntary movement, seizure and syncope.       Physical Exam Updated Vital Signs BP (!) 142/96 (BP Location: Left Arm)    Pulse (!) 120    Temp 98.3 F (36.8 C) (Oral)    Resp (!) 23    Ht 6\' 2"  (1.88 m)    Wt 83.5 kg    SpO2 97%    BMI 23.62 kg/m   Patient Vitals for the past 24 hrs:  BP Temp Temp src Pulse Resp SpO2 Height Weight  07/20/18 1020 (!) 142/96 98.3 F (36.8 C) Oral (!) 120 (!) 23 97 % -- --  07/20/18 1019 -- -- -- -- -- -- 6\' 2"  (1.88 m) 83.5 kg      Physical Exam 1035: Physical examination:  Nursing notes reviewed; Vital signs and O2 SAT reviewed;  Constitutional: Well  developed, Well nourished, Uncomfortable appearing.; Head:  Normocephalic, atraumatic; Eyes: EOMI, PERRL, No scleral icterus; ENMT: Mouth and pharynx normal, Mucous membranes moist; Neck: Supple, Full range of motion, No lymphadenopathy; Cardiovascular: Tachycardic rate and rhythm, No gallop; Respiratory: Breath sounds clear & equal bilaterally, No wheezes.  Speaking full sentences with ease, Normal respiratory effort/excursion; Chest: Nontender, Movement normal; Abdomen: Soft, Nontender, Nondistended, Normal bowel sounds; Genitourinary: No CVA tenderness; Spine:  +TTP midline CS, TS, LS tenderness.;;  Extremities: Peripheral pulses normal, +left>right hip tenderness to palp. No edema, No calf edema or asymmetry.; Neuro: AA&Ox3, Major CN grossly intact. No facial droop. Speech clear. Grips equal. Pt states he is unable to raise his arms up due to increasing pain in CS/TS. Pt states he can bend his left knee, but "I have to lift the leg up with my arm for the past 2 weeks."  +5/5 strength bilat feet plantarflexion and dorsiflexion, including great toes..; Skin: Color normal, Warm, Dry.   ED Treatments / Results  Labs (all labs ordered are listed, but only abnormal results are displayed)   EKG EKG Interpretation  Date/Time:  Saturday July 20 2018 10:20:18 EDT Ventricular Rate:  117 PR Interval:    QRS Duration: 89 QT Interval:  297 QTC Calculation: 415 R Axis:   65 Text Interpretation:  Sinus tachycardia Baseline wander Artifact No old tracing to compare Confirmed by Francine Graven [phone]) on 07/20/2018 11:23:00 AM   Radiology   Procedures Procedures (including critical care time)  Medications Ordered in ED Medications  fentaNYL (SUBLIMAZE) injection 50 mcg (has no administration in time range)     Initial Impression / Assessment and Plan / ED Course  I have reviewed the triage vital signs and the nursing notes.  Pertinent labs & imaging results that were available during my care  of the patient were reviewed by  me and considered in my medical decision making (see chart for details).  Clinical Course as of Jul 22 743  Sat Jul 20, 2018  2135 Discussed case with Dr. Maylon Peppers who recommends medical admission for pain control and once pain is more adequately controlled, recommends further assessment of possible isolated metastasis that are hurting the worst.  These areas may be amenable to radiation oncology.  Therefore, he does not think that there is a indication to acutely transfer patient to [facility].  Recommends continue to treat constipation.  Appreciate his involvement.   [AM]    Clinical Course User Index [AM] Tamala Julian    MDM Reviewed: vitals, nursing note and previous chart Reviewed previous: MRI, ECG and labs Interpretation: labs, ECG and x-ray Total time providing critical care: 30-74 minutes. This excludes time spent performing separately reportable procedures and services. Consults: Elsie Lincoln MD, EDP, Neurosurgery.   CRITICAL CARE Performed by: Francine Graven Total critical care time: 45 minutes Critical care time was exclusive of separately billable procedures and treating other patients. Critical care was necessary to treat or prevent imminent or life-threatening deterioration. Critical care was time spent personally by me on the following activities: development of treatment plan with patient and/or surrogate as well as nursing, discussions with consultants, evaluation of patient's response to treatment, examination of patient, obtaining history from patient or surrogate, ordering and performing treatments and interventions, ordering and review of laboratory studies, ordering and review of radiographic studies, pulse oximetry and re-evaluation of patient's condition.    Freeman Caldron, PA-C   07/16/18 2:03 PM Note   I called and spoke with Mr. Halleck by phone prior to our scheduled consult visit this afternoon to obtain  additional information.  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  More recently, he developed severe mid to low back pain and acute urinary retention requiring multiple visits to the emergency room.  Recent MRI scans show diffuse osseous metastatic disease throughout the thoracic and lumbar spine as well as a 3 cm expansile metastasis in the left iliac bone.  His case was discussed at recent multidisciplinary urology conference and recommendations are to proceed with disease restaging with CT chest, abdomen and pelvis as well as a biopsy of the iliac and/or spinal lesion(s) for tissue confirmation.  He is scheduled for CT imaging at [facility] urology with Dr. Alinda Money on 07/30/2018 and has not yet been scheduled for the CT biopsy.  However, he reports that his pain is very poorly controlled on prednisone and Percocet.  The pain is severely limiting his mobility and ability to participate in daily ADLs.  I have discussed his case with Dr. Tammi Klippel who recommends proceeding with palliative radiation to the painful disease in the spine without delay while awaiting restaging scans and tissue biopsy.  In speaking with the patient, he expresses concerns for daily trips over for XRT since he lives close to the [place], in [place], [place].  Therefore, we will make arrangements for him to have a consultation with Dr. Francesca Jewett at [facility], ASAP for consideration of palliative radiation to the spine closer to home in [place].  He is quite grateful for this consideration and eager to start treatment to obtain better pain control and improved quality of life.  In the interim, he will continue taking prednisone and  Percocet as prescribed.  I have placed the referral for urgent consult and spoken personally with Dr. Francesca Jewett who will  make arrangements to get the patient in to be seen and treated in the very near future.  Nicholos Johns, MMS, PA-C [place] [facility] [place]: [phone]   Fax: [phone]       Pryor Curia, MD - 07/16/2018 2:00 PM EDT Formatting of this note might be different from the original. Radiation Oncology Initial Visit Note  Patient Name: Mecca Barga Patient Age: 48 y.o. Encounter Date: 07/16/2018 MRN: [id number]  Assessment:  Mr. Benning is a 48 y.o. man with multiple sites of osseous metastatic disease presenting to discuss options for palliative management with radiation. The underlying malignant pathology is not entirely clear to me since he was found to have favorable intermediate-risk prostate cancer with a recent undetectable PSA and a non-muscle invasive bladder cancer that were both appropriately treated less than a year ago. Further, he has a negative PET/CT scan nine months ago, so this is very rapid progression of a highly aggressive malignant process. He dose have a heavy smoking history and a concerning family history of malignancies so a metachronous third primary is feasible, but for someone to have three separate malignancies within one year would be remarkable.   Mr. Crean was originally going to see Dr. Ledon Snare in [place] but given the patient's situation and since he lives in [place], Dr. Tammi Klippel kindly referred the patient to our facility. I will speak with the patient's other treating physicians, including Dr. [name] to understand his plan and see his interest in obtaining tissue. I do not think we need tissue to proceed with radiation and would like to expedite the patient's planning both to help with his pain and prevent neurologic progression of disease. I will complete the evaluation of his spine and will obtain a C- and T-spine MRI with and without contrast today. The patient  is in such terrible pain and his last MRI was difficult for him to obtain. I will forego brain MRI for the time being since a careful neurologic exam revealed no suspicious findings and I would like to make sure we can full evaluate the upper spine first. We will plan to have the patient return tomorrow for a CT simulation followed by his first fraction of radiation, pending results of the remainder of the spine. I suspect the patient will have additional sites of disease, but should the only measurable disease be within the planned radiation field and if Dr. [name] agrees a biopsy would be helpful, we will wait until a biopsy can be obtained before proceeding with radiation, but I think this would be unlikely. Finally, the patient is taking 20mg  prednisone (I believe 10mg  BID) along with percocet (? Dose) with some relief of pain. He had good relief of pain from flexeril that he had left over from an old injury, so I will provide a new prescription for this.  Mr. Laura asked several good questions and agreed to proceed with treatment. I explained the possible side effects of radiation to the lower spine and possibly the pelvis, but will speak with him about any additional risks once we have additional cross-sectional studies.  Recommendations: - MRI C- and T-spine with and without contrast today - Will consider PET and MRI brain with and without in the future - Will plan for palliative-intent radiation to the lower spine and possibly the  pelvis pending further imaging.  - Likely sim and treat tomorrow. Exact dose and fractionation to be determined tomorrow. I'll hold RT if he needs a biopsy and the only disease is within the field but I am comfortable proceeding with radiation without new tissue - Consider biopsy of new site of disease and also genetics consultation, I will speak with Dr. [name] about these along with the overall plan - Continue steroids  - Given Rx for flexeril  *ADDENDUM  07/17/2018* MRI C- and T-spine were obtained late yesterday and I'm addending my initial consult note for clarity in the plan. The patient has widespread disease through the spine and likely numerous liver lesions. Of particular concern, he has metastases at C3, T4 and T8 that I believe come with the highest risk of neurologic complications. Based on the L-spine MRI and my CT simulation this morning, he also has two lesions in the L1 vertebral body with the superior most lesion breaking through the right lateral posterior aspect of the vertebral body and entering the canal, but I do not believe there is epidural disease here, which is just above the conus. Patient also has multiple lesions in the pelvis, including the bilateral acetabulum.  I will speak with the patient and am getting in touch with Dr. [name]. My plan will be to treat the C3 lesion and T4-8 lesions since these are the highest risk and we can consider palliative treatment to the other spine and pelvic lesions as needed in the future. I would like to preserve as much functioning marrow as possible and I do not think the patient could tolerate more than two sites of treatment at a time anyway.  Thank you for involving me in the care of Mr. Wynonia Lawman, please contact my office with any questions."    Results for orders placed or performed during the hospital encounter of 07/20/18  SARS Coronavirus 2 (CEPHEID - Performed in [facility] hospital lab), [facility] Order   Specimen: Nasopharyngeal Swab  Result Value Ref Range   SARS Coronavirus 2 NEGATIVE NEGATIVE  Basic metabolic panel  Result Value Ref Range   Sodium 135 135 - 145 mmol/L   Potassium 4.3 3.5 - 5.1 mmol/L   Chloride 96 (L) 98 - 111 mmol/L   CO2 26 22 - 32 mmol/L   Glucose, Bld 129 (H) 70 - 99 mg/dL   BUN 25 (H) 6 - 20 mg/dL   Creatinine, Ser 0.93 0.61 - 1.24 mg/dL   Calcium 10.4 (H) 8.9 - 10.3 mg/dL   GFR calc non Af Amer >60 >60 mL/min   GFR calc Af Amer >60 >60 mL/min   Anion  gap 13 5 - 15  Troponin I (High Sensitivity)  Result Value Ref Range   Troponin I (High Sensitivity) <2.0 <18 ng/L  CBC with Differential  Result Value Ref Range   WBC 12.2 (H) 4.0 - 10.5 K/uL   RBC 4.14 (L) 4.22 - 5.81 MIL/uL   Hemoglobin 13.1 13.0 - 17.0 g/dL   HCT 41.5 39.0 - 52.0 %   MCV 100.2 (H) 80.0 - 100.0 fL   MCH 31.6 26.0 - 34.0 pg   MCHC 31.6 30.0 - 36.0 g/dL   RDW 11.6 11.5 - 15.5 %   Platelets 388 150 - 400 K/uL   nRBC 0.0 0.0 - 0.2 %   Neutrophils Relative % 82 %   Neutro Abs 10.1 (H) 1.7 - 7.7 K/uL   Lymphocytes Relative 7 %   Lymphs Abs 0.9 0.7 -  4.0 K/uL   Monocytes Relative 9 %   Monocytes Absolute 1.1 (H) 0.1 - 1.0 K/uL   Eosinophils Relative 1 %   Eosinophils Absolute 0.1 0.0 - 0.5 K/uL   Basophils Relative 0 %   Basophils Absolute 0.0 0.0 - 0.1 K/uL   Immature Granulocytes 1 %   Abs Immature Granulocytes 0.06 0.00 - 0.07 K/uL  Protime-INR  Result Value Ref Range   Prothrombin Time 13.7 11.4 - 15.2 seconds   INR 1.1 0.8 - 1.2  Urinalysis, Routine w reflex microscopic  Result Value Ref Range   Color, Urine YELLOW YELLOW   APPearance CLOUDY (A) CLEAR   Specific Gravity, Urine 1.010 1.005 - 1.030   pH 7.0 5.0 - 8.0   Glucose, UA NEGATIVE NEGATIVE mg/dL   Hgb urine dipstick SMALL (A) NEGATIVE   Bilirubin Urine NEGATIVE NEGATIVE   Ketones, ur NEGATIVE NEGATIVE mg/dL   Protein, ur NEGATIVE NEGATIVE mg/dL   Nitrite NEGATIVE NEGATIVE   Leukocytes,Ua MODERATE (A) NEGATIVE   RBC / HPF 0-5 0 - 5 RBC/hpf   WBC, UA >50 (H) 0 - 5 WBC/hpf   Bacteria, UA MANY (A) NONE SEEN   Squamous Epithelial / LPF 0-5 0 - 5   WBC Clumps PRESENT    Non Squamous Epithelial 0-5 (A) NONE SEEN    Dg Chest Port 1 View Result Date: 07/20/2018 CLINICAL DATA:  Chest pain.  Metastatic bladder and prostate cancer. EXAM: PORTABLE CHEST 1 VIEW COMPARISON:  Chest x-ray dated November 12, 2017. FINDINGS: The heart size and mediastinal contours are within normal limits. Normal pulmonary  vascularity. Low lung volumes with mild right basilar atelectasis. No acute osseous abnormality. No focal consolidation, pleural effusion, or pneumothorax. IMPRESSION: Mild right basilar atelectasis.  No active disease. Electronically Signed   By: Titus Dubin M.D.   On: 07/20/2018 11:31     CHARLIE SEDA was evaluated in Emergency Department on 07/20/2018 for the symptoms described in the history of present illness. He was evaluated in the context of the global COVID-19 pandemic, which necessitated consideration that the patient might be at risk for infection with the SARS-CoV-2 virus that causes COVID-19. Institutional protocols and algorithms that pertain to the evaluation of patients at risk for COVID-19 are in a state of rapid change based on information released by regulatory bodies including the CDC and federal and state organizations. These policies and algorithms were followed during the patient's care in the ED.    1055:  Rad Onc MD note from [facility] has above. Pt denies any new focal deficits, but ROM is limited by extreme pain at this time. Pt will need MRI CS/TS/LS, to r/o any acute neurosurgical issue first, then admit to [facility] service for Med Onc and Rad Onc consults.  T[facility]C returned [facility] Rad Onc Dr. Isidore Moos, case discussed, including:  HPI, pertinent PM/SHx, VS/PE, dx testing, ED course and treatment:  Agrees with MDM above and pt will need repeat MRI CS/TS/LS first , OK to give IV decadron 4mg  now, transfer to [facility] for MRI's then admit for consults.    1115:  T/C to [facility] EDP Dr. Ralene Bathe, case discussed, including:  HPI, pertinent PM/SHx, VS/PE, dx testing, ED course and treatment:  Agrees to accept transfer for emergent MRI's, then admit for consults.   1200:  T/C returned from [facility] Neuro Surgery Dr. Zada Finders, case discussed, including:  HPI, pertinent PM/SHx, VS/PE, dx testing, ED course and treatment:  Aware of pt's transfer to ED to obtain  MRI's, agreeable to consult if needed.        Final Clinical Impressions(s) / ED Diagnoses   Final diagnoses:  None    ED Discharge Orders    None       Francine Graven, DO 07/22/18 [phone]

## 2018-07-20 NOTE — ED Provider Notes (Signed)
Physical Exam  BP (!) 131/92   Pulse (!) 107   Temp 98.3 F (36.8 C) (Oral)   Resp (!) 21   Ht 6\' 2"  (1.88 m)   Wt 83.5 kg   SpO2 93%   BMI 23.62 kg/m   Assumed care from Dr. Deno Etienne at [phone]. Briefly, the patient is a 48 y.o. 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 here with diffuse spinal pain.  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  Labs Reviewed  BASIC METABOLIC PANEL - Abnormal; Notable for the following components:      Result Value   Chloride 96 (*)    Glucose, Bld 129 (*)    BUN 25 (*)    Calcium 10.4 (*)    All other components within normal limits  CBC WITH DIFFERENTIAL/PLATELET - Abnormal; Notable for the following components:   WBC 12.2 (*)    RBC 4.14 (*)    MCV 100.2 (*)    Neutro Abs 10.1 (*)    Monocytes Absolute 1.1 (*)    All other components within normal limits  URINALYSIS, ROUTINE W REFLEX MICROSCOPIC - Abnormal; Notable for the following components:   APPearance CLOUDY (*)    Hgb urine dipstick SMALL (*)    Leukocytes,Ua MODERATE (*)    WBC, UA >50 (*)    Bacteria, UA MANY (*)    Non Squamous Epithelial 0-5 (*)    All other components within normal limits  HEPATIC FUNCTION PANEL - Abnormal; Notable for the following components:   Albumin 2.8 (*)    AST 14 (*)    Alkaline Phosphatase 129 (*)    All other components within normal limits  SARS CORONAVIRUS 2 (HOSPITAL ORDER, [facility])  URINE CULTURE  TROPONIN I (HIGH SENSITIVITY)  TROPONIN I (HIGH SENSITIVITY)  PROTIME-INR  LIPASE, BLOOD    Course of Care:   Physical Exam Vitals signs and  nursing note reviewed.  Constitutional:      General: He is not in acute distress.    Appearance: He is well-developed. He is not ill-appearing or diaphoretic.     Comments: Appears uncomfortable but nontoxic appearing.   HENT:     Head: Normocephalic and atraumatic.  Eyes:     General:        Right eye: No discharge.        Left eye: No discharge.     Conjunctiva/sclera: Conjunctivae normal.     Comments: EOMs normal to gross examination.  Neck:     Musculoskeletal: Normal range of motion.  Cardiovascular:     Rate and Rhythm: Normal rate and regular rhythm.     Comments: Intact, 2+ radial pulse. Abdominal:     General: There is no distension.  Musculoskeletal: Normal range of motion.     Comments: TTP diffusely over ribs.   Skin:    General: Skin is warm and dry.  Neurological:     Mental Status: He is alert.     Comments: Cranial nerves intact to gross observation. Patient moves extremities without difficulty.  Psychiatric:        Behavior: Behavior normal.        Thought  Content: Thought content normal.        Judgment: Judgment normal.     ED Course/Procedures   Clinical Course as of Jul 20 2134  Sat Jul 20, 2018  2135 Discussed case with Dr. Maylon Peppers who recommends medical admission for pain control and once pain is more adequately controlled, recommends further assessment of possible isolated metastasis that are hurting the worst.  These areas may be amenable to radiation oncology.  Therefore, he does not think that there is a indication to acutely transfer patient to [facility].  Recommends continue to treat constipation.  Appreciate his involvement.   [AM]    Clinical Course User Index [AM] Albesa Seen, PA-C    Procedures  MDM   Patient awaiting MRI of cervical, thoracic, and lumbar spine.  If there is evidence of spinal cord compression, will consult neurosurgery.  Otherwise, patient to be admitted for pain control by hospital medicine as well as  treatment of urinary tract infection.  MRI demonstrating diffuse progression of bony mets throughout the spine.  He also has a large medial left iliac wing metastasis with extraosseous extension of tumor substantially increase in's MRI on 07-07-18.  Case was discussed with Dr. Eliezer Lofts, who recommends further medical management in the hospital, and only transferred to [facility] long once an area of more focal pain is identified that may be amenable to palliative radiation.  Otherwise, medical treatment is indicated.  Appreciate his involvement.  Dr. Tamala Julian to admit.  Appreciate his involvement.    Albesa Seen, PA-C 07/20/18 [place], [place], DO 07/21/18 Gloriajean Dell

## 2018-07-20 NOTE — ED Notes (Signed)
Patient transported to MRI 

## 2018-07-20 NOTE — H&P (Signed)
History and Physical    Rodney [name] ZOX:[id number] DOB: [birth year] DOA: 07/20/2018  Referring MD/NP/PA: Langston Masker, PA-C PCP: Curlene Labrum, MD  Patient coming from: home  Chief Complaint: Back pain  I have personally briefly reviewed patient's old medical records in [place]   HPI: Rodney [name] is a 48 y.o. male with medical history significant of bladder tumor, prostate cancer s/p robotic assisted cystoprostatectomy, anxiety, and depression; who presents with complaints of severe pain in his back over the last week.  Complains of having pain in the right shoulder moved to the left side and now goes down  his back wrapping around his chest.  He denies any recent falls or sustaining any trauma.  He reports utilizing his home pain medications, steroids, and muscle relaxants without relief of symptoms.  He has been unable to lift his legs due to symptoms.  Patient reports that he was supposed to have a biopsy of the left iliac crest lesion on 7/7.  Associated symptoms include constipation as patient reports last bowel movement was approximately 6 days ago.  Denies having any fever, shortness of breath, cough, or abdominal pain.  ED Course: Upon admission into the emergency department patient was noted to be afebrile, pulse up to 120, respirations up to 28, and O2 saturation relatively maintained on room air.  Labs revealed WBC 12.2.  Urinalysis positive for moderate leukocytes, many bacteria, and greater than 50 WBCs.  COVID-19 testing was negative.  Patient was initially seen at [facility] and transferred here to [facility] for need of MRI.  MRI revealed diffuse progression of bony mets throughout the spine and a large medial left iliac wing metastases with extraosseous extension of tumor increased from previous study done on 6/21.  Dr. Maylon Peppers of oncology recommended patient admission for pain control.  Patient may need palliative radiation but no need to transport to [place] [facility] at  this time.  Patient was given Rocephin IV, dexamethasone 4 mg, fentanyl IV, and Dilaudid IV.  Review of Systems  Constitutional: Negative for fever.  HENT: Negative for congestion and ear discharge.   Eyes: Negative for double vision and pain.  Respiratory: Negative for cough.   Cardiovascular: Positive for chest pain. Negative for leg swelling.  Gastrointestinal: Positive for constipation.  Musculoskeletal: Positive for back pain and joint pain. Negative for falls.  Skin: Positive for itching.  Neurological: Negative for focal weakness and loss of consciousness.  All other systems reviewed and are negative.   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[place])     Past Surgical History:  Procedure Laterality Date   BACK SURGERY  2012   3 disc; dr Carloyn Manner  did first and dr elsner did the last 2    CYSTOSCOPY W/ RETROGRADES N/A 07/02/2017   Procedure: CYSTOSCOPY WITH RETROGRADE  PYELOGRAM/EXAM UNDER ANESTHESIA;  Surgeon: Raynelle Bring, MD;  Location: [facility];  Service: Urology;  Laterality: N/A;   CYSTOSCOPY WITH BIOPSY N/A 08/09/2017   Procedure: CYSTOSCOPY WITH PROSTATE NEEDLE BIOPSY;  Surgeon: Raynelle Bring, MD;  Location: [facility];  Service: Urology;  Laterality: N/A;  GENERAL ANESTHESIA WITH PARALYSIS/ ONLY NEEDS 60 MIN FOR ALL PROCEDURES   LEG SURGERY  2012   4 metal plates in plates    TRANSRECTAL ULTRASOUND N/A 08/09/2017   Procedure: TRANSRECTAL ULTRASOUND;  Surgeon: Raynelle Bring, MD;  Location: [facility];  Service:  Urology;  Laterality: N/A;  ONLY NEEDS 60 MIN FOR ALL PROCEDURES   TRANSURETHRAL RESECTION OF BLADDER TUMOR N/A 07/02/2017   Procedure: TRANSURETHRAL RESECTION OF BLADDER TUMOR (TURBT);  Surgeon: Raynelle Bring, MD;  Location: [facility];  Service: Urology;  Laterality: N/A;   TRANSURETHRAL RESECTION OF BLADDER TUMOR N/A 08/09/2017   Procedure: TRANSURETHRAL RESECTION OF BLADDER TUMOR (TURBT);  Surgeon: Raynelle Bring, MD;  Location: [facility];  Service: Urology;  Laterality: N/A;     reports that he quit smoking about 2 years ago. His smoking use included cigarettes. He has a 16.00 pack-year smoking history. He has quit using smokeless tobacco. He reports previous alcohol use. He reports previous drug use. Drug: Marijuana.  Allergies  Allergen Reactions   Morphine And Related Itching   Vicodin [Hydrocodone-Acetaminophen] Itching    Family History  Problem Relation Age of Onset   Heart attack Father    Hypertension Father    Diabetes Father    Heart failure Father    Hypertension Mother    Gastric cancer Sister 73       d. 76   Prostate cancer Brother 57   Melanoma Maternal Aunt    Stroke Maternal Uncle    Stroke Paternal Uncle    Melanoma Maternal Grandmother        dx under 49   Colon cancer Maternal Grandmother    Heart disease Maternal Grandfather    Heart attack Paternal Grandfather     Prior to Admission medications   Medication Sig Start Date End Date Taking? Authorizing Provider  cyanocobalamin (,VITAMIN B-12,) 1000 MCG/ML injection Inject 1,000 mcg into the muscle every 30 (thirty) days.   Yes [provider]  cyclobenzaprine (FLEXERIL) 10 MG tablet Take 10 mg by mouth 3 (three) times daily.   Yes [provider]  Multiple Vitamin (MULTIVITAMIN WITH MINERALS) TABS tablet Take 1 tablet by mouth daily.   Yes [provider]  naproxen sodium (ALEVE) 220 MG tablet Take 220-440 mg by mouth 2 (two) times daily as needed.   Yes [provider]  Oxycodone HCl 10 MG TABS Take 1 tablet by mouth every 4 (four) hours as needed. 07/18/18  Yes [provider]  oxyCODONE-acetaminophen (PERCOCET) 10-325 MG tablet Take 1 tablet by mouth every 4 (four) hours as needed for pain. Patient taking differently: Take 1 tablet by mouth every 4 (four) hours.  07/12/18  Yes Shadad, Mathis Dad, MD  OXYCONTIN 10 MG 12 hr tablet Take 1 tablet by mouth every 12 (twelve) hours. 07/18/18  Yes [provider]  predniSONE (DELTASONE) 10 MG tablet Take 2 tablets (20 mg total) by mouth daily. 07/12/18  Yes [name], MD  sildenafil (VIAGRA) 100 MG tablet Take 100 mg by mouth daily as needed. 03/22/18  Yes [provider]  vitamin B-12 (CYANOCOBALAMIN) 1000 MCG tablet Take 1,000 mcg by mouth 2 (two) times a day.   Yes [provider]    Physical Exam:  Constitutional: Middle-aged male who appears to be in acute distress Vitals:   07/20/18 1215 07/20/18 1230 07/20/18 1300 07/20/18 1330  BP:  (!) 117/92 127/84 (!) 131/92  Pulse: (!) 103 (!) 107 95 (!) 107  Resp: (!) 23 (!) 26 17 (!) 21  Temp:      TempSrc:      SpO2: 95% (!) 89% 94% 93%  Weight:      Height:       Eyes: PERRL, lids and conjunctivae normal ENMT: Mucous membranes are dry. Posterior pharynx clear of any exudate or lesions.  Neck: normal, supple, no masses, no thyromegaly Respiratory: clear to auscultation bilaterally, no wheezing, no crackles. Normal respiratory effort. No accessory muscle use.  Cardiovascular: Regular rate and rhythm, no murmurs / rubs / gallops. No extremity edema. 2+ pedal pulses. No carotid bruits.  Abdomen: No significant tenderness to palpation.  Appreciate fullness of the abdomen.  Bowel sounds are present, but slightly decreased. Musculoskeletal: Patient appears to be significant distress with any movement or manipulation Skin: no rashes, lesions, ulcers. No induration Neurologic: CN 2-12 grossly intact. Sensation intact,  DTR normal. Strength 5/5 in all 4.  Psychiatric: Normal judgment and insight. Alert and oriented x 3.  Anxious mood.     Labs on Admission: I have personally reviewed following labs and imaging studies  CBC: Recent Labs  Lab 07/20/18 1053  WBC 12.2*  NEUTROABS 10.1*  HGB 13.1  HCT 41.5  MCV 100.2*  PLT 628   Basic Metabolic Panel: Recent Labs  Lab 07/20/18 1053  NA 135  K 4.3  CL 96*  CO2 26  GLUCOSE 129*  BUN 25*  CREATININE 0.93  CALCIUM 10.4*   GFR: Estimated Creatinine Clearance: 110.5 mL/min (by C-G formula based on SCr of 0.93 mg/dL). Liver Function Tests: Recent Labs  Lab 07/20/18 1527  AST 14*  ALT 19  ALKPHOS 129*  BILITOT 0.6  PROT 6.9  ALBUMIN 2.8*   Recent Labs  Lab 07/20/18 1527  LIPASE 20   No results for input(s): AMMONIA in the last 168 hours. Coagulation Profile: Recent Labs  Lab 07/20/18 1053  INR 1.1   Cardiac Enzymes: No results for input(s): CKTOTAL, CKMB, CKMBINDEX, TROPONINI in the last 168 hours. BNP (last 3 results) No results for input(s): PROBNP in the last 8760 hours. HbA1C: No results for input(s): HGBA1C in the last 72 hours. CBG: No results for input(s): GLUCAP in the last 168 hours. Lipid Profile: No results for input(s): CHOL, HDL, LDLCALC, TRIG, CHOLHDL, LDLDIRECT in the last 72 hours. Thyroid Function Tests: No results for input(s): TSH, T4TOTAL, FREET4, T3FREE, THYROIDAB in the last 72 hours. Anemia Panel: No results for input(s): VITAMINB12, FOLATE, FERRITIN, TIBC, IRON, RETICCTPCT in the last 72 hours. Urine analysis:    Component Value Date/Time   COLORURINE YELLOW 07/20/2018 1208   APPEARANCEUR CLOUDY (A) 07/20/2018 1208   LABSPEC 1.010 07/20/2018 1208   PHURINE 7.0 07/20/2018 1208   GLUCOSEU NEGATIVE 07/20/2018 1208   HGBUR SMALL (A) 07/20/2018 1208   BILIRUBINUR NEGATIVE 07/20/2018 [place] 07/20/2018 1208   PROTEINUR NEGATIVE 07/20/2018 1208   NITRITE NEGATIVE 07/20/2018 1208     LEUKOCYTESUR MODERATE (A) 07/20/2018 1208   Sepsis Labs: Recent Results (from the past 240 hour(s))  SARS Coronavirus 2 (CEPHEID - Performed in [place] hospital lab), Hosp Order     Status: None   Collection Time: 07/20/18 11:06 AM   Specimen:  Nasopharyngeal Swab  Result Value Ref Range Status   SARS Coronavirus 2 NEGATIVE NEGATIVE Final    Comment: (NOTE) If result is NEGATIVE SARS-CoV-2 target nucleic acids are NOT DETECTED. The SARS-CoV-2 RNA is generally detectable in upper and lower  respiratory specimens during the acute phase of infection. The lowest  concentration of SARS-CoV-2 viral copies this assay can detect is 250  copies / mL. A negative result does not preclude SARS-CoV-2 infection  and should not be used as the sole basis for treatment or other  patient management decisions.  A negative result may occur with  improper specimen collection / handling, submission of specimen other  than nasopharyngeal swab, presence of viral mutation(s) within the  areas targeted by this assay, and inadequate number of viral copies  (<250 copies / mL). A negative result must be combined with clinical  observations, patient history, and epidemiological information. If result is POSITIVE SARS-CoV-2 target nucleic acids are DETECTED. The SARS-CoV-2 RNA is generally detectable in upper and lower  respiratory specimens dur ing the acute phase of infection.  Positive  results are indicative of active infection with SARS-CoV-2.  Clinical  correlation with patient history and other diagnostic information is  necessary to determine patient infection status.  Positive results do  not rule out bacterial infection or co-infection with other viruses. If result is PRESUMPTIVE POSTIVE SARS-CoV-2 nucleic acids MAY BE PRESENT.   A presumptive positive result was obtained on the submitted specimen  and confirmed on repeat testing.  While 2019 novel coronavirus  (SARS-CoV-2) nucleic acids may be  present in the submitted sample  additional confirmatory testing may be necessary for epidemiological  and / or clinical management purposes  to differentiate between  SARS-CoV-2 and other Sarbecovirus currently known to infect humans.  If clinically indicated additional testing with an alternate test  methodology [id number]) is advised. The SARS-CoV-2 RNA is generally  detectable in upper and lower respiratory sp ecimens during the acute  phase of infection. The expected result is Negative. Fact Sheet for Patients:  StrictlyIdeas.no Fact Sheet for Healthcare Providers: BankingDealers.co.za This test is not yet approved or cleared by the [place] FDA and has been authorized for detection and/or diagnosis of SARS-CoV-2 by FDA under an Emergency Use Authorization (EUA).  This EUA will remain in effect (meaning this test can be used) for the duration of the COVID-19 declaration under Section 564(b)(1) of the Act, 21 U.S.C. section 360bbb-3(b)(1), unless the authorization is terminated or revoked sooner. Performed at [facility], [address], [place], [place] [address]      Radiological Exams on Admission: Mr Cervical Spine W Or Wo Contrast  Result Date: 07/20/2018 CLINICAL DATA:  48 year old male with metastatic prostate cancer. EXAM: MRI TOTAL SPINE WITHOUT AND WITH CONTRAST TECHNIQUE: Multisequence MR imaging of the spine from the cervical spine to the sacrum was performed prior to and following IV contrast administration for evaluation of spinal metastatic disease. CONTRAST:  8 milliliters Gadavist COMPARISON:  Cervical and thoracic MRI 07/16/2018. Lumbar MRI 07/07/2018. FINDINGS: MRI CERVICAL SPINE FINDINGS Alignment: Stable cervical lordosis. Vertebrae: C3 vertebral body metastasis redemonstrated and stable in size, approximately 13 millimeters diameter. No epidural or extraosseous extension. There is a subtle C2 inferior endplate  metastasis (series 6, image 7) with no extraosseous extension, increased. No other cervical spine metastasis; mild degenerative marrow edema suspected in the right C3 inferior articulating facet is also stable. No metastasis at the visible skull base. Cord: Spinal cord signal is within normal limits  at all visualized levels. No abnormal intradural enhancement. No dural thickening. Posterior Fossa, vertebral arteries, paraspinal tissues: Cervicomedullary junction is within normal limits. Negative visible posterior fossa. Preserved major vascular flow voids in the neck. The right vertebral artery appears mildly dominant. Disc levels: Stable degenerative changes with spinal stenosis at C3-C4, C5-C6 and C6-C7 again noted. Degenerative cervical neural foraminal stenosis as detailed last month. MRI THORACIC SPINE FINDINGS Segmentation:  Normal. Alignment:  Stable thoracic kyphosis. Vertebrae: Widespread thoracic metastases. A small T2 metastasis is redemonstrated in the midline lamina, and demonstrates early dorsal epidural space extension on series 7, image 7 which appears increased. Posterior vertebral body T4 metastasis with epidural extension to the right of midline measures about 19 millimeters diameter and has not significantly changed. There is no associated spinal stenosis, although there is early epidural involvement of the right T4 neural foramen on series 30, image 6, stable. Metastasis in the T5 spinous process and enlarging the right costovertebral junction is stable (the latter up to 22 millimeters). There is early extension into the right T5 neural foramen but no associated neural impingement. T6 left inferior pedicle metastasis and posterior left 6th rib metastasis redemonstrated, the former with epidural extension into the left T6 neural foramen on series 29, image 13, stable. Left T7 vertebral body metastasis is stable without extraosseous extension. Bulky left side metastasis with ventral epidural and  left T8 neural foraminal extension is stable measuring about 31 millimeters diameter. There is associated left foraminal stenosis, stable. Possible small T9 posterior inferior endplate metastasis is stable, and there are expansile bilateral posterior 9th rib metastases located near the costovertebral junctions (series 32, image 22). The larger is 24 millimeters. The T10 level appears spared. Superior endplate metastases at F79 and T12 are redemonstrated without extraosseous extension. There is a left 12 costovertebral junction metastasis, stable (series 33, image 33). Cord: Capacious spinal canal. Spinal cord signal is within normal limits at all visualized levels. No abnormal intradural enhancement. No dural thickening. Paraspinal and other soft tissues: Small pleural effusions. Disc levels: No thoracic spinal stenosis despite ventral epidural tumor at T4 and T8. There is stable neural foraminal impingement at the left T6 and T8 levels. MRI LUMBAR SPINE FINDINGS Segmentation:  Normal. Alignment:  Stable mild straightening of lumbar lordosis. Vertebrae: Multilevel lumbar and partially visible sac, T2, ral and pelvic metastases. L1 posterosuperior endplate, right side vertebral body, and right inferior articulating facet metastases re-demonstrated. There is early ventral epidural extension of tumor with the former (series 35, image 10) which has increased since 07/07/2018. The facet metastasis has also increased. Small L2 superior endplate metastasis is stable without extraosseous extension. Three L3 vertebral body metastases, with mildly enlarged involvement at the anterior superior endplate since 02/40/9735, and early extension into the inferior left L3 neural foramen which is stable (series 38, image 16). Previous left L4 laminectomy. A small right L4 vertebral metastasis is minimally increased on series 37, image 5. A midline posterosuperior endplate L5 metastasis with early ventral epidural extension also  appears slightly larger on series 38, image 23. At that same level a bulky metastasis in the left iliac wing with extraosseous extension has increased from 31-45 millimeters over the same time. 24 millimeter left ventral S1 metastasis with early extraosseous extension into the presacral space has also mildly increased. There is no sacral metastasis related neural impingement identified. Conus medullaris: Extends to the T12-L1 level and appears normal. No lower spinal cord or conus signal abnormality. Normal cauda equina nerve roots. No abnormal  intradural enhancement or dural thickening. Paraspinal and other soft tissues: Stable visualized abdominal viscera. Disc levels: No lumbar spinal stenosis or malignant neural impingement despite early epidural and foraminal involvement at several levels. IMPRESSION: 1. 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 Epidural and/or neural foraminal extension of tumor at the C3, T4, T6, T8, L1, L3, and L5. But no malignant spinal stenosis or cord compression. Multiple metastases throughout the spine are slightly larger since the MRIs last month, but there is no pathologic vertebral fracture. 2. No intradural metastasis. 3. A large medial left iliac wing metastasis with extraosseous extension of tumor has substantially increased since 07/07/18 (45 mm now versus 31 mm previously). Electronically Signed   By: Genevie Ann M.D.   On: 07/20/2018 21:16   Mr Thoracic Spine W Wo Contrast  Result Date: 07/20/2018 CLINICAL DATA:  48 year old male with metastatic prostate cancer. EXAM: MRI TOTAL SPINE WITHOUT AND WITH CONTRAST TECHNIQUE: Multisequence MR imaging of the spine from the cervical spine to the sacrum was performed prior to and following IV contrast administration for evaluation of spinal metastatic disease. CONTRAST:  8 milliliters Gadavist COMPARISON:  Cervical and thoracic MRI 07/16/2018. Lumbar MRI 07/07/2018. FINDINGS: MRI CERVICAL SPINE FINDINGS  Alignment: Stable cervical lordosis. Vertebrae: C3 vertebral body metastasis redemonstrated and stable in size, approximately 13 millimeters diameter. No epidural or extraosseous extension. There is a subtle C2 inferior endplate metastasis (series 6, image 7) with no extraosseous extension, increased. No other cervical spine metastasis; mild degenerative marrow edema suspected in the right C3 inferior articulating facet is also stable. No metastasis at the visible skull base. Cord: Spinal cord signal is within normal limits at all visualized levels. No abnormal intradural enhancement. No dural thickening. Posterior Fossa, vertebral arteries, paraspinal tissues: Cervicomedullary junction is within normal limits. Negative visible posterior fossa. Preserved major vascular flow voids in the neck. The right vertebral artery appears mildly dominant. Disc levels: Stable degenerative changes with spinal stenosis at C3-C4, C5-C6 and C6-C7 again noted. Degenerative cervical neural foraminal stenosis as detailed last month. MRI THORACIC SPINE FINDINGS Segmentation:  Normal. Alignment:  Stable thoracic kyphosis. Vertebrae: Widespread thoracic metastases. A small T2 metastasis is redemonstrated in the midline lamina, and demonstrates early dorsal epidural space extension on series 7, image 7 which appears increased. Posterior vertebral body T4 metastasis with epidural extension to the right of midline measures about 19 millimeters diameter and has not significantly changed. There is no associated spinal stenosis, although there is early epidural involvement of the right T4 neural foramen on series 30, image 6, stable. Metastasis in the T5 spinous process and enlarging the right costovertebral junction is stable (the latter up to 22 millimeters). There is early extension into the right T5 neural foramen but no associated neural impingement. T6 left inferior pedicle metastasis and posterior left 6th rib metastasis redemonstrated,  the former with epidural extension into the left T6 neural foramen on series 29, image 13, stable. Left T7 vertebral body metastasis is stable without extraosseous extension. Bulky left side metastasis with ventral epidural and left T8 neural foraminal extension is stable measuring about 31 millimeters diameter. There is associated left foraminal stenosis, stable. Possible small T9 posterior inferior endplate metastasis is stable, and there are expansile bilateral posterior 9th rib metastases located near the costovertebral junctions (series 32, image 22). The larger is 24 millimeters. The T10 level appears spared. Superior endplate metastases at U20 and T12 are redemonstrated without extraosseous extension. There is a left 12 costovertebral junction metastasis, stable (  series 33, image 33). Cord: Capacious spinal canal. Spinal cord signal is within normal limits at all visualized levels. No abnormal intradural enhancement. No dural thickening. Paraspinal and other soft tissues: Small pleural effusions. Disc levels: No thoracic spinal stenosis despite ventral epidural tumor at T4 and T8. There is stable neural foraminal impingement at the left T6 and T8 levels. MRI LUMBAR SPINE FINDINGS Segmentation:  Normal. Alignment:  Stable mild straightening of lumbar lordosis. Vertebrae: Multilevel lumbar and partially visible sac, T2, ral and pelvic metastases. L1 posterosuperior endplate, right side vertebral body, and right inferior articulating facet metastases re-demonstrated. There is early ventral epidural extension of tumor with the former (series 35, image 10) which has increased since 07/07/2018. The facet metastasis has also increased. Small L2 superior endplate metastasis is stable without extraosseous extension. Three L3 vertebral body metastases, with mildly enlarged involvement at the anterior superior endplate since 16/10/9602, and early extension into the inferior left L3 neural foramen which is stable (series  38, image 16). Previous left L4 laminectomy. A small right L4 vertebral metastasis is minimally increased on series 37, image 5. A midline posterosuperior endplate L5 metastasis with early ventral epidural extension also appears slightly larger on series 38, image 23. At that same level a bulky metastasis in the left iliac wing with extraosseous extension has increased from 31-45 millimeters over the same time. 24 millimeter left ventral S1 metastasis with early extraosseous extension into the presacral space has also mildly increased. There is no sacral metastasis related neural impingement identified. Conus medullaris: Extends to the T12-L1 level and appears normal. No lower spinal cord or conus signal abnormality. Normal cauda equina nerve roots. No abnormal intradural enhancement or dural thickening. Paraspinal and other soft tissues: Stable visualized abdominal viscera. Disc levels: No lumbar spinal stenosis or malignant neural impingement despite early epidural and foraminal involvement at several levels. IMPRESSION: 1. 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 Epidural and/or neural foraminal extension of tumor at the C3, T4, T6, T8, L1, L3, and L5. But no malignant spinal stenosis or cord compression. Multiple metastases throughout the spine are slightly larger since the MRIs last month, but there is no pathologic vertebral fracture. 2. No intradural metastasis. 3. A large medial left iliac wing metastasis with extraosseous extension of tumor has substantially increased since 07/07/18 (45 mm now versus 31 mm previously). Electronically Signed   By: Genevie Ann M.D.   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 EXAM: MRI TOTAL SPINE WITHOUT AND WITH CONTRAST TECHNIQUE: Multisequence MR imaging of the spine from the cervical spine to the sacrum was performed prior to and following IV  contrast administration for evaluation of spinal metastatic disease. CONTRAST:  8 milliliters Gadavist COMPARISON:  Cervical and thoracic MRI 07/16/2018. Lumbar MRI 07/07/2018. FINDINGS: MRI CERVICAL SPINE FINDINGS Alignment: Stable cervical lordosis. Vertebrae: C3 vertebral body metastasis redemonstrated and stable in size, approximately 13 millimeters diameter. No epidural or extraosseous extension. There is a subtle C2 inferior endplate metastasis (series 6, image 7) with no extraosseous extension, increased. No other cervical spine metastasis; mild degenerative marrow edema suspected in the right C3 inferior articulating facet is also stable. No metastasis at the visible skull base. Cord: Spinal cord signal is within normal limits at all visualized levels. No abnormal intradural enhancement. No dural thickening. Posterior Fossa, vertebral arteries, paraspinal tissues: Cervicomedullary junction is within normal limits. Negative visible posterior fossa. Preserved  major vascular flow voids in the neck. The right vertebral artery appears mildly dominant. Disc levels: Stable degenerative changes with spinal stenosis at C3-C4, C5-C6 and C6-C7 again noted. Degenerative cervical neural foraminal stenosis as detailed last month. MRI THORACIC SPINE FINDINGS Segmentation:  Normal. Alignment:  Stable thoracic kyphosis. Vertebrae: Widespread thoracic metastases. A small T2 metastasis is redemonstrated in the midline lamina, and demonstrates early dorsal epidural space extension on series 7, image 7 which appears increased. Posterior vertebral body T4 metastasis with epidural extension to the right of midline measures about 19 millimeters diameter and has not significantly changed. There is no associated spinal stenosis, although there is early epidural involvement of the right T4 neural foramen on series 30, image 6, stable. Metastasis in the T5 spinous process and enlarging the right costovertebral junction is stable (the  latter up to 22 millimeters). There is early extension into the right T5 neural foramen but no associated neural impingement. T6 left inferior pedicle metastasis and posterior left 6th rib metastasis redemonstrated, the former with epidural extension into the left T6 neural foramen on series 29, image 13, stable. Left T7 vertebral body metastasis is stable without extraosseous extension. Bulky left side metastasis with ventral epidural and left T8 neural foraminal extension is stable measuring about 31 millimeters diameter. There is associated left foraminal stenosis, stable. Possible small T9 posterior inferior endplate metastasis is stable, and there are expansile bilateral posterior 9th rib metastases located near the costovertebral junctions (series 32, image 22). The larger is 24 millimeters. The T10 level appears spared. Superior endplate metastases at B14 and T12 are redemonstrated without extraosseous extension. There is a left 12 costovertebral junction metastasis, stable (series 33, image 33). Cord: Capacious spinal canal. Spinal cord signal is within normal limits at all visualized levels. No abnormal intradural enhancement. No dural thickening. Paraspinal and other soft tissues: Small pleural effusions. Disc levels: No thoracic spinal stenosis despite ventral epidural tumor at T4 and T8. There is stable neural foraminal impingement at the left T6 and T8 levels. MRI LUMBAR SPINE FINDINGS Segmentation:  Normal. Alignment:  Stable mild straightening of lumbar lordosis. Vertebrae: Multilevel lumbar and partially visible sac, T2, ral and pelvic metastases. L1 posterosuperior endplate, right side vertebral body, and right inferior articulating facet metastases re-demonstrated. There is early ventral epidural extension of tumor with the former (series 35, image 10) which has increased since 07/07/2018. The facet metastasis has also increased. Small L2 superior endplate metastasis is stable without extraosseous  extension. Three L3 vertebral body metastases, with mildly enlarged involvement at the anterior superior endplate since 78/29/5621, and early extension into the inferior left L3 neural foramen which is stable (series 38, image 16). Previous left L4 laminectomy. A small right L4 vertebral metastasis is minimally increased on series 37, image 5. A midline posterosuperior endplate L5 metastasis with early ventral epidural extension also appears slightly larger on series 38, image 23. At that same level a bulky metastasis in the left iliac wing with extraosseous extension has increased from 31-45 millimeters over the same time. 24 millimeter left ventral S1 metastasis with early extraosseous extension into the presacral space has also mildly increased. There is no sacral metastasis related neural impingement identified. Conus medullaris: Extends to the T12-L1 level and appears normal. No lower spinal cord or conus signal abnormality. Normal cauda equina nerve roots. No abnormal intradural enhancement or dural thickening. Paraspinal and other soft tissues: Stable visualized abdominal viscera. Disc levels: No lumbar spinal stenosis or malignant neural impingement despite early epidural and  foraminal involvement at several levels. IMPRESSION: 1. 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 Epidural and/or neural foraminal extension of tumor at the C3, T4, T6, T8, L1, L3, and L5. But no malignant spinal stenosis or cord compression. Multiple metastases throughout the spine are slightly larger since the MRIs last month, but there is no pathologic vertebral fracture. 2. No intradural metastasis. 3. A large medial left iliac wing metastasis with extraosseous extension of tumor has substantially increased since 07/07/18 (45 mm now versus 31 mm previously). Electronically Signed   By: Genevie Ann M.D.   On: 07/20/2018 21:16   Ct Abdomen Pelvis W Contrast  Result Date: 07/20/2018 CLINICAL DATA:  Back pain.  EXAM: CT ABDOMEN AND PELVIS WITH CONTRAST TECHNIQUE: Multidetector CT imaging of the abdomen and pelvis was performed using the standard protocol following bolus administration of intravenous contrast. CONTRAST:  158mL OMNIPAQUE IOHEXOL 300 MG/ML  SOLN COMPARISON:  CT scan of July 05, 2018.  MRI of July 07, 2018. FINDINGS: Lower chest: Mild bilateral posterior basilar subsegmental atelectasis is noted. Hepatobiliary: No gallstones or biliary dilatation is noted. Multiple small rounded hypoechoic lesions are seen throughout hepatic parenchyma concerning for metastatic disease. The largest measures 13 mm. Pancreas: Unremarkable. No pancreatic ductal dilatation or surrounding inflammatory changes. Spleen: Normal in size without focal abnormality. Adrenals/Urinary Tract: Adrenal glands appear normal. Bilateral renal cysts are noted. No hydronephrosis or renal obstruction is noted. No renal or ureteral calculi are noted. The bladder is very irregular in contour which may be postsurgical in etiology. Stomach/Bowel: The stomach appears normal. There is no evidence of bowel obstruction or inflammation. Stool is noted throughout the colon. The appendix appears normal. Vascular/Lymphatic: No significant vascular findings are present. No enlarged abdominal or pelvic lymph nodes. Reproductive: No definite mass is noted. Other: No abdominal wall hernia or abnormality. No abdominopelvic ascites. Musculoskeletal: Multiple lytic metastatic lesions are noted in the visualized left ribs, spine and pelvis. The largest lesion is noted in the T8 vertebral body and measures 2.9 cm. It extends slightly posteriorly into the posterior spinal canal. IMPRESSION: Multiple small hepatic metastatic lesions are noted. Multiple lytic metastatic lesions are noted in the visualized ribs, spine and pelvis. The bladder is irregular in contour which most likely is postsurgical in etiology. Stool is noted throughout the colon. Electronically Signed    By: Marijo Conception M.D.   On: 07/20/2018 16:40   Dg Chest Port 1 View  Result Date: 07/20/2018 CLINICAL DATA:  Chest pain.  Metastatic bladder and prostate cancer. EXAM: PORTABLE CHEST 1 VIEW COMPARISON:  Chest x-ray dated November 12, 2017. FINDINGS: The heart size and mediastinal contours are within normal limits. Normal pulmonary vascularity. Low lung volumes with mild right basilar atelectasis. No acute osseous abnormality. No focal consolidation, pleural effusion, or pneumothorax. IMPRESSION: Mild right basilar atelectasis.  No active disease. Electronically Signed   By: Titus Dubin M.D.   On: 07/20/2018 11:31    EKG: Independently reviewed.  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  MRI of the spine revealed several lesions from the cervical down to the lumbar spine.  Patient was given 4 mg of Decadron IV, fentanyl, and Dilaudid IV without relief of symptoms. -Admit to MedSurg bed -Continuous pulse oximetry overnight -Continue home medications of OxyContin twice daily -Dilaudid PCA pump  per protocol -Continue prednisone  -Palliative care consult for pain management  Malignant prostate cancer: Patient with history of prostate cancer status post robotic radical cystoprostatectomy with bilateral pelvic lymphadenectomy with creation of neobladder in 10/2017.  Patient followed in the outpatient setting currently by Dr. [name]. -Oncology notified -May warrant/benefit from palliative radiation if able to localize pain  Urinary tract infection: Acute.  Urinalysis was positive for moderate leukocytes with many bacteria seen.  -Follow-up urine culture -Continue Rocephin  SIRS/sepsis, leukocytosis: Acute.  Patient was initially noted to be tachycardic and tachypneic with WBC elevated up to 12.2  meeting SIRS criteria.  Patient was found to have a urinary tract infection which could be caused leukocytosis, but he is also on steroids.  -Add-on lactic acid level -Recheck CBC in a.m.  Constipation: Patient reports that he has not had a bowel movement since Monday.  Imaging shows significant stool burden with no signs of obstruction. -Senokot twice daily -Trial of smog enema in a.m.   DVT prophylaxis: lovenox Code Status: full Family Communication: No family present at bedside. Disposition Plan: TBD Consults called: oncology Admission status: observation  Norval Morton MD [facility] Pager [phone]   If 7PM-7AM, please contact night-coverage [email or web address] Password [facility]  07/20/2018, 9:51 PM

## 2018-07-20 NOTE — ED Triage Notes (Signed)
Pt has cancer of the spine he is hurting in his back chest and going down his legs.

## 2018-07-21 ENCOUNTER — Encounter (HOSPITAL_COMMUNITY): Payer: Self-pay

## 2018-07-21 DIAGNOSIS — C801 Malignant (primary) neoplasm, unspecified: Secondary | ICD-10-CM

## 2018-07-21 DIAGNOSIS — C689 Malignant neoplasm of urinary organ, unspecified: Secondary | ICD-10-CM | POA: Diagnosis present

## 2018-07-21 DIAGNOSIS — Z79891 Long term (current) use of opiate analgesic: Secondary | ICD-10-CM | POA: Diagnosis not present

## 2018-07-21 DIAGNOSIS — M545 Low back pain: Secondary | ICD-10-CM | POA: Diagnosis present

## 2018-07-21 DIAGNOSIS — D72829 Elevated white blood cell count, unspecified: Secondary | ICD-10-CM | POA: Diagnosis not present

## 2018-07-21 DIAGNOSIS — Z8042 Family history of malignant neoplasm of prostate: Secondary | ICD-10-CM | POA: Diagnosis not present

## 2018-07-21 DIAGNOSIS — Z7189 Other specified counseling: Secondary | ICD-10-CM

## 2018-07-21 DIAGNOSIS — A419 Sepsis, unspecified organism: Secondary | ICD-10-CM | POA: Diagnosis present

## 2018-07-21 DIAGNOSIS — Z7952 Long term (current) use of systemic steroids: Secondary | ICD-10-CM | POA: Diagnosis not present

## 2018-07-21 DIAGNOSIS — K5903 Drug induced constipation: Secondary | ICD-10-CM | POA: Diagnosis not present

## 2018-07-21 DIAGNOSIS — R52 Pain, unspecified: Secondary | ICD-10-CM | POA: Diagnosis not present

## 2018-07-21 DIAGNOSIS — Z515 Encounter for palliative care: Secondary | ICD-10-CM

## 2018-07-21 DIAGNOSIS — Z1159 Encounter for screening for other viral diseases: Secondary | ICD-10-CM | POA: Diagnosis not present

## 2018-07-21 DIAGNOSIS — C7951 Secondary malignant neoplasm of bone: Secondary | ICD-10-CM

## 2018-07-21 DIAGNOSIS — Z79899 Other long term (current) drug therapy: Secondary | ICD-10-CM | POA: Diagnosis not present

## 2018-07-21 DIAGNOSIS — I472 Ventricular tachycardia: Secondary | ICD-10-CM | POA: Diagnosis not present

## 2018-07-21 DIAGNOSIS — G893 Neoplasm related pain (acute) (chronic): Secondary | ICD-10-CM | POA: Diagnosis present

## 2018-07-21 DIAGNOSIS — E875 Hyperkalemia: Secondary | ICD-10-CM | POA: Diagnosis present

## 2018-07-21 DIAGNOSIS — Z8546 Personal history of malignant neoplasm of prostate: Secondary | ICD-10-CM | POA: Diagnosis not present

## 2018-07-21 DIAGNOSIS — Z808 Family history of malignant neoplasm of other organs or systems: Secondary | ICD-10-CM | POA: Diagnosis not present

## 2018-07-21 DIAGNOSIS — C61 Malignant neoplasm of prostate: Secondary | ICD-10-CM | POA: Diagnosis present

## 2018-07-21 DIAGNOSIS — C799 Secondary malignant neoplasm of unspecified site: Secondary | ICD-10-CM | POA: Diagnosis not present

## 2018-07-21 DIAGNOSIS — Z87891 Personal history of nicotine dependence: Secondary | ICD-10-CM | POA: Diagnosis not present

## 2018-07-21 DIAGNOSIS — Z800 Family history of malignant neoplasm of digestive organs: Secondary | ICD-10-CM | POA: Diagnosis not present

## 2018-07-21 DIAGNOSIS — N390 Urinary tract infection, site not specified: Secondary | ICD-10-CM | POA: Diagnosis present

## 2018-07-21 DIAGNOSIS — M8458XA Pathological fracture in neoplastic disease, other specified site, initial encounter for fracture: Secondary | ICD-10-CM | POA: Diagnosis present

## 2018-07-21 DIAGNOSIS — Z8551 Personal history of malignant neoplasm of bladder: Secondary | ICD-10-CM | POA: Diagnosis not present

## 2018-07-21 DIAGNOSIS — C787 Secondary malignant neoplasm of liver and intrahepatic bile duct: Secondary | ICD-10-CM | POA: Diagnosis present

## 2018-07-21 DIAGNOSIS — C679 Malignant neoplasm of bladder, unspecified: Secondary | ICD-10-CM | POA: Diagnosis not present

## 2018-07-21 LAB — BASIC METABOLIC PANEL
Anion gap: 11 (ref 5–15)
BUN: 23 mg/dL — ABNORMAL HIGH (ref 6–20)
CO2: 28 mmol/L (ref 22–32)
Calcium: 10.7 mg/dL — ABNORMAL HIGH (ref 8.9–10.3)
Chloride: 96 mmol/L — ABNORMAL LOW (ref 98–111)
Creatinine, Ser: 1.05 mg/dL (ref 0.61–1.24)
GFR calc Af Amer: >60 mL/min (ref >60)
GFR calc non Af Amer: >60 mL/min (ref >60)
Glucose, Bld: 97 mg/dL (ref 70–99)
Potassium: 5.2 mmol/L — ABNORMAL HIGH (ref 3.5–5.1)
Sodium: 135 mmol/L (ref 135–145)

## 2018-07-21 LAB — CBC
HCT: 41.5 % (ref 39.0–52.0)
Hemoglobin: 13.1 g/dL (ref 13.0–17.0)
MCH: 31.3 pg (ref 26.0–34.0)
MCHC: 31.6 g/dL (ref 30.0–36.0)
MCV: 99.3 fL (ref 80.0–100.0)
Platelets: 384 10*3/uL (ref 150–400)
RBC: 4.18 MIL/uL — ABNORMAL LOW (ref 4.22–5.81)
RDW: 11.9 % (ref 11.5–15.5)
WBC: 12.2 10*3/uL — ABNORMAL HIGH (ref 4.0–10.5)
nRBC: 0 % (ref 0.0–0.2)

## 2018-07-21 LAB — LACTIC ACID, PLASMA: Lactic Acid, Venous: 1 mmol/L (ref 0.5–1.9)

## 2018-07-21 MED ORDER — HYDROMORPHONE HCL 1 MG/ML IJ SOLN
2.0000 mg | INTRAMUSCULAR | Status: DC | PRN
  Administered 2018-07-21 (×2): 2 mg via INTRAVENOUS
  Filled 2018-07-21 (×2): qty 2

## 2018-07-21 MED ORDER — FENTANYL CITRATE (PF) 100 MCG/2ML IJ SOLN
50.0000 ug | Freq: Once | INTRAMUSCULAR | Status: AC
  Administered 2018-07-21: 50 ug via INTRAVENOUS
  Filled 2018-07-21: qty 2

## 2018-07-21 MED ORDER — FENTANYL CITRATE (PF) 100 MCG/2ML IJ SOLN
75.0000 ug | INTRAMUSCULAR | Status: DC | PRN
  Administered 2018-07-21 – 2018-07-22 (×10): 75 ug via INTRAVENOUS
  Filled 2018-07-21 (×10): qty 2

## 2018-07-21 MED ORDER — FENTANYL 50 MCG/HR TD PT72
1.0000 | MEDICATED_PATCH | TRANSDERMAL | Status: DC
  Administered 2018-07-21: 1 via TRANSDERMAL
  Filled 2018-07-21: qty 1

## 2018-07-21 MED ORDER — SENNOSIDES-DOCUSATE SODIUM 8.6-50 MG PO TABS
2.0000 | ORAL_TABLET | Freq: Two times a day (BID) | ORAL | Status: DC
  Administered 2018-07-21 – 2018-07-22 (×4): 2 via ORAL
  Filled 2018-07-21 (×5): qty 2

## 2018-07-21 MED ORDER — SODIUM CHLORIDE 0.9 % IV SOLN
INTRAVENOUS | Status: DC
  Administered 2018-07-21 – 2018-08-01 (×20): via INTRAVENOUS

## 2018-07-21 MED ORDER — DEXAMETHASONE SODIUM PHOSPHATE 4 MG/ML IJ SOLN
4.0000 mg | Freq: Two times a day (BID) | INTRAMUSCULAR | Status: DC
  Administered 2018-07-21 – 2018-08-02 (×25): 4 mg via INTRAVENOUS
  Filled 2018-07-21 (×26): qty 1

## 2018-07-21 MED ORDER — BISACODYL 5 MG PO TBEC
10.0000 mg | DELAYED_RELEASE_TABLET | Freq: Every day | ORAL | Status: DC
  Administered 2018-07-21 – 2018-07-23 (×3): 10 mg via ORAL
  Filled 2018-07-21 (×3): qty 2

## 2018-07-21 MED ORDER — POLYETHYLENE GLYCOL 3350 17 G PO PACK
17.0000 g | PACK | Freq: Every day | ORAL | Status: DC
  Administered 2018-07-22 – 2018-07-25 (×4): 17 g via ORAL
  Filled 2018-07-21 (×4): qty 1

## 2018-07-21 MED ORDER — OXYCODONE HCL ER 10 MG PO T12A
20.0000 mg | EXTENDED_RELEASE_TABLET | Freq: Two times a day (BID) | ORAL | Status: DC
  Administered 2018-07-21: 20 mg via ORAL
  Filled 2018-07-21: qty 2

## 2018-07-21 NOTE — Progress Notes (Addendum)
PROGRESS NOTE    Rodney [name]  MBE:[id number] DOB: 1970/05/18 DOA: 07/20/2018 PCP: Curlene Labrum, MD    Brief Narrative:48 y.o. male with medical history significant of bladder tumor, prostate cancer s/p robotic assisted cystoprostatectomy, anxiety, and depression; who presents with complaints of severe pain in his back over the last week.  Complains of having pain in the right shoulder moved to the left side and now goes down  his back wrapping around his chest.  He denies any recent falls or sustaining any trauma.  He reports utilizing his home pain medications, steroids, and muscle relaxants without relief of symptoms.  He has been unable to lift his legs due to symptoms.  Patient reports that he was supposed to have a biopsy of the left iliac crest lesion on 7/7.  Associated symptoms include constipation as patient reports last bowel movement was approximately 6 days ago.  Denies having any fever, shortness of breath, cough, or abdominal pain.  ED Course: Upon admission into the emergency department patient was noted to be afebrile, pulse up to 120, respirations up to 28, and O2 saturation relatively maintained on room air.  Labs revealed WBC 12.2.  Urinalysis positive for moderate leukocytes, many bacteria, and greater than 50 WBCs.  COVID-19 testing was negative.  Patient was initially seen at [facility] and transferred here to [facility] for need of MRI.  MRI revealed diffuse progression of bony mets throughout the spine and a large medial left iliac wing metastases with extraosseous extension of tumor increased from previous study done on 6/21.  Dr. Maylon Peppers of oncology recommended patient admission for pain control.  Patient may need palliative radiation but no need to transport to [place] [facility] at this time.  Patient was given Rocephin IV, dexamethasone 4 mg, fentanyl IV, and Dilaudid IV.  Assessment & Plan:   Principal Problem:   Intractable pain Active Problems:   Malignant neoplasm of  prostate (HCC)   Spine metastasis (HCC)   Constipation   Acute lower UTI   Leukocytosis  #1 metastatic prostate cancer-patient admitted for pain control.  He was started on Dilaudid PCA which the patient said is not working and wanted to get off of it as it is making noise all the time.  However he was willing to take IV Dilaudid.  He was in 10 out of 10 pain when I saw him this morning.  I gave him 1 dose of fentanyl 50 MCG after that the nursing staff were able to move him in bed and reposition him.  I will change his pain management to fentanyl patch with Dilaudid as needed and OxyContin to be continued.  Palliative care has been consulted for pain management.  Oncology has been informed at the time of admission.?  If he might need palliative radiation to spine.  MRI of the spine  Widespread spinal and partially visible rib and pelvic metastases. Epidural and/or neural foraminal extension of tumor at the C3, T4, T6, T8, L1, L3, and L5. But no malignant spinal stenosis or cord compression. Multiple metastases throughout the spine are slightly larger since the MRIs last month, but there is no pathologic vertebral fracture.. No intradural metastasis.  A large medial left iliac wing metastasis with extraosseous extension of tumor has substantially increased since 07/07/18 (45 mm now versus 31 mm previously).  With widespread meta stasis as above patient in excruciating pain and needs ongoing IV pain management and palliative care input prior to discharge.  He failed outpatient treatment  with oxycodone OxyContin Flexeril and Percocet.  #2 constipation -reports no bowel movements for 6 days prior to admission to hospital.  Continue Senokot and smog enema today.  I will add Dulcolax.  #3 patient met Sirs criteria upon admission with tachycardia tachypnea and leukocytosis.  UA appears dirty with leukocytes and many bacteria started on Rocephin continue and follow-up urine culture.  #4  hyperkalemia with increase in creatinine since admission I will start him on IV fluids patient appears dehydrated with dry mucous membrane not had much to eat or drink due to pain.  Follow-up labs tomorrow.  #5 hypercalcemia increase since admission from 10.4-10.8 possibly due to decreased p.o. intake and dehydration we will see if this improves with IV fluids.  DVT prophylaxis: lovenox Code Status: full Family Communication: He did not want me to call anyone. Disposition Plan: TBD Consults called: oncology palliative care  Estimated body mass index is 23.62 kg/m as calculated from the following:   Height as of this encounter: 6' 2"  (1.88 m).   Weight as of this encounter: 83.5 kg.    Subjective: Complaints  of 10 out of 10 pain cannot move at all staff gave him 50 MCG of fentanyl was able to reposition him in bed after that.  Reports no BMs for the last 6 days.  Objective: Vitals:   07/20/18 1330 07/21/18 0037 07/21/18 0423 07/21/18 0805  BP: (!) 131/92 128/82 124/89 121/90  Pulse: (!) 107 96 (!) 103 (!) 108  Resp: (!) 21 16 (!) 24 20  Temp:  97.8 F (36.6 C) 98.6 F (37 C) 98.6 F (37 C)  TempSrc:  Oral Oral Oral  SpO2: 93% 99% 95% 97%  Weight:      Height:        Intake/Output Summary (Last 24 hours) at 07/21/2018 3532 Last data filed at 07/21/2018 9924 Gross per 24 hour  Intake --  Output 900 ml  Net -900 ml   Filed Weights   07/20/18 1019  Weight: 83.5 kg    Examination: Oral mucosa dry  General exam: Appears restless uncomfortable in distress due to excruciating back pain  Respiratory system: Clear to auscultation. Respiratory effort normal. Cardiovascular system: S1 & S2 heard, RRR. No JVD, murmurs, rubs, gallops or clicks. No pedal edema. Gastrointestinal system: Abdomen is distended, soft and tender. No organomegaly or masses felt. Normal bowel sounds heard. Central nervous system: Alert and oriented. No focal neurological deficits. Extremities: Symmetric 5  x 5 power. Skin: No rashes, lesions or ulcers Psychiatry: Judgement and insight appear normal. Mood & affect appropriate.     Data Reviewed: I have personally reviewed following labs and imaging studies  CBC: Recent Labs  Lab 07/20/18 1053 07/21/18 0743  WBC 12.2* 12.2*  NEUTROABS 10.1*  --   HGB 13.1 13.1  HCT 41.5 41.5  MCV 100.2* 99.3  PLT 388 268   Basic Metabolic Panel: Recent Labs  Lab 07/20/18 1053 07/21/18 0743  NA 135 135  K 4.3 5.2*  CL 96* 96*  CO2 26 28  GLUCOSE 129* 97  BUN 25* 23*  CREATININE 0.93 1.05  CALCIUM 10.4* 10.7*   GFR: Estimated Creatinine Clearance: 97.9 mL/min (by C-G formula based on SCr of 1.05 mg/dL). Liver Function Tests: Recent Labs  Lab 07/20/18 1527  AST 14*  ALT 19  ALKPHOS 129*  BILITOT 0.6  PROT 6.9  ALBUMIN 2.8*   Recent Labs  Lab 07/20/18 1527  LIPASE 20   No results for input(s): AMMONIA in  the last 168 hours. Coagulation Profile: Recent Labs  Lab 07/20/18 1053  INR 1.1   Cardiac Enzymes: No results for input(s): CKTOTAL, CKMB, CKMBINDEX, TROPONINI in the last 168 hours. BNP (last 3 results) No results for input(s): PROBNP in the last 8760 hours. HbA1C: No results for input(s): HGBA1C in the last 72 hours. CBG: No results for input(s): GLUCAP in the last 168 hours. Lipid Profile: No results for input(s): CHOL, HDL, LDLCALC, TRIG, CHOLHDL, LDLDIRECT in the last 72 hours. Thyroid Function Tests: No results for input(s): TSH, T4TOTAL, FREET4, T3FREE, THYROIDAB in the last 72 hours. Anemia Panel: No results for input(s): VITAMINB12, FOLATE, FERRITIN, TIBC, IRON, RETICCTPCT in the last 72 hours. Sepsis Labs: Recent Labs  Lab 07/21/18 0039  LATICACIDVEN 1.0    Recent Results (from the past 240 hour(s))  SARS Coronavirus 2 (CEPHEID - Performed in [facility] hospital lab), Hosp Order     Status: None   Collection Time: 07/20/18 11:06 AM   Specimen: Nasopharyngeal Swab  Result Value Ref Range Status    SARS Coronavirus 2 NEGATIVE NEGATIVE Final    Comment: (NOTE) If result is NEGATIVE SARS-CoV-2 target nucleic acids are NOT DETECTED. The SARS-CoV-2 RNA is generally detectable in upper and lower  respiratory specimens during the acute phase of infection. The lowest  concentration of SARS-CoV-2 viral copies this assay can detect is 250  copies / mL. A negative result does not preclude SARS-CoV-2 infection  and should not be used as the sole basis for treatment or other  patient management decisions.  A negative result may occur with  improper specimen collection / handling, submission of specimen other  than nasopharyngeal swab, presence of viral mutation(s) within the  areas targeted by this assay, and inadequate number of viral copies  (<250 copies / mL). A negative result must be combined with clinical  observations, patient history, and epidemiological information. If result is POSITIVE SARS-CoV-2 target nucleic acids are DETECTED. The SARS-CoV-2 RNA is generally detectable in upper and lower  respiratory specimens dur ing the acute phase of infection.  Positive  results are indicative of active infection with SARS-CoV-2.  Clinical  correlation with patient history and other diagnostic information is  necessary to determine patient infection status.  Positive results do  not rule out bacterial infection or co-infection with other viruses. If result is PRESUMPTIVE POSTIVE SARS-CoV-2 nucleic acids MAY BE PRESENT.   A presumptive positive result was obtained on the submitted specimen  and confirmed on repeat testing.  While 2019 novel coronavirus  (SARS-CoV-2) nucleic acids may be present in the submitted sample  additional confirmatory testing may be necessary for epidemiological  and / or clinical management purposes  to differentiate between  SARS-CoV-2 and other Sarbecovirus currently known to infect humans.  If clinically indicated additional testing with an alternate test    methodology [phone]) is advised. The SARS-CoV-2 RNA is generally  detectable in upper and lower respiratory sp ecimens during the acute  phase of infection. The expected result is Negative. Fact Sheet for Patients:  StrictlyIdeas.no Fact Sheet for Healthcare Providers: BankingDealers.co.za This test is not yet approved or cleared by the [place] FDA and has been authorized for detection and/or diagnosis of SARS-CoV-2 by FDA under an Emergency Use Authorization (EUA).  This EUA will remain in effect (meaning this test can be used) for the duration of the COVID-19 declaration under Section 564(b)(1) of the Act, 21 U.S.C. section 360bbb-3(b)(1), unless the authorization is terminated or revoked sooner. Performed  at [facility], [address] [place], [place] [address]          Radiology Studies: Mr Cervical Spine W Or Wo Contrast  Result Date: 07/20/2018 CLINICAL DATA:  48 year old male with metastatic prostate cancer. EXAM: MRI TOTAL SPINE WITHOUT AND WITH CONTRAST TECHNIQUE: Multisequence MR imaging of the spine from the cervical spine to the sacrum was performed prior to and following IV contrast administration for evaluation of spinal metastatic disease. CONTRAST:  8 milliliters Gadavist COMPARISON:  Cervical and thoracic MRI 07/16/2018. Lumbar MRI 07/07/2018. FINDINGS: MRI CERVICAL SPINE FINDINGS Alignment: Stable cervical lordosis. Vertebrae: C3 vertebral body metastasis redemonstrated and stable in size, approximately 13 millimeters diameter. No epidural or extraosseous extension. There is a subtle C2 inferior endplate metastasis (series 6, image 7) with no extraosseous extension, increased. No other cervical spine metastasis; mild degenerative marrow edema suspected in the right C3 inferior articulating facet is also stable. No metastasis at the visible skull base. Cord: Spinal cord signal is within normal limits at all visualized  levels. No abnormal intradural enhancement. No dural thickening. Posterior Fossa, vertebral arteries, paraspinal tissues: Cervicomedullary junction is within normal limits. Negative visible posterior fossa. Preserved major vascular flow voids in the neck. The right vertebral artery appears mildly dominant. Disc levels: Stable degenerative changes with spinal stenosis at C3-C4, C5-C6 and C6-C7 again noted. Degenerative cervical neural foraminal stenosis as detailed last month. MRI THORACIC SPINE FINDINGS Segmentation:  Normal. Alignment:  Stable thoracic kyphosis. Vertebrae: Widespread thoracic metastases. A small T2 metastasis is redemonstrated in the midline lamina, and demonstrates early dorsal epidural space extension on series 7, image 7 which appears increased. Posterior vertebral body T4 metastasis with epidural extension to the right of midline measures about 19 millimeters diameter and has not significantly changed. There is no associated spinal stenosis, although there is early epidural involvement of the right T4 neural foramen on series 30, image 6, stable. Metastasis in the T5 spinous process and enlarging the right costovertebral junction is stable (the latter up to 22 millimeters). There is early extension into the right T5 neural foramen but no associated neural impingement. T6 left inferior pedicle metastasis and posterior left 6th rib metastasis redemonstrated, the former with epidural extension into the left T6 neural foramen on series 29, image 13, stable. Left T7 vertebral body metastasis is stable without extraosseous extension. Bulky left side metastasis with ventral epidural and left T8 neural foraminal extension is stable measuring about 31 millimeters diameter. There is associated left foraminal stenosis, stable. Possible small T9 posterior inferior endplate metastasis is stable, and there are expansile bilateral posterior 9th rib metastases located near the costovertebral junctions (series  32, image 22). The larger is 24 millimeters. The T10 level appears spared. Superior endplate metastases at W62 and T12 are redemonstrated without extraosseous extension. There is a left 12 costovertebral junction metastasis, stable (series 33, image 33). Cord: Capacious spinal canal. Spinal cord signal is within normal limits at all visualized levels. No abnormal intradural enhancement. No dural thickening. Paraspinal and other soft tissues: Small pleural effusions. Disc levels: No thoracic spinal stenosis despite ventral epidural tumor at T4 and T8. There is stable neural foraminal impingement at the left T6 and T8 levels. MRI LUMBAR SPINE FINDINGS Segmentation:  Normal. Alignment:  Stable mild straightening of lumbar lordosis. Vertebrae: Multilevel lumbar and partially visible sac, T2, ral and pelvic metastases. L1 posterosuperior endplate, right side vertebral body, and right inferior articulating facet metastases re-demonstrated. There is early ventral epidural extension of tumor with the  former (series 35, image 10) which has increased since 07/07/2018. The facet metastasis has also increased. Small L2 superior endplate metastasis is stable without extraosseous extension. Three L3 vertebral body metastases, with mildly enlarged involvement at the anterior superior endplate since 35/36/1443, and early extension into the inferior left L3 neural foramen which is stable (series 38, image 16). Previous left L4 laminectomy. A small right L4 vertebral metastasis is minimally increased on series 37, image 5. A midline posterosuperior endplate L5 metastasis with early ventral epidural extension also appears slightly larger on series 38, image 23. At that same level a bulky metastasis in the left iliac wing with extraosseous extension has increased from 31-45 millimeters over the same time. 24 millimeter left ventral S1 metastasis with early extraosseous extension into the presacral space has also mildly increased. There  is no sacral metastasis related neural impingement identified. Conus medullaris: Extends to the T12-L1 level and appears normal. No lower spinal cord or conus signal abnormality. Normal cauda equina nerve roots. No abnormal intradural enhancement or dural thickening. Paraspinal and other soft tissues: Stable visualized abdominal viscera. Disc levels: No lumbar spinal stenosis or malignant neural impingement despite early epidural and foraminal involvement at several levels. IMPRESSION: 1. 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 Epidural and/or neural foraminal extension of tumor at the C3, T4, T6, T8, L1, L3, and L5. But no malignant spinal stenosis or cord compression. Multiple metastases throughout the spine are slightly larger since the MRIs last month, but there is no pathologic vertebral fracture. 2. No intradural metastasis. 3. A large medial left iliac wing metastasis with extraosseous extension of tumor has substantially increased since 07/07/18 (45 mm now versus 31 mm previously). Electronically Signed   By: Genevie Ann M.D.   On: 07/20/2018 21:16   Mr Thoracic Spine W Wo Contrast  Result Date: 07/20/2018 CLINICAL DATA:  48 year old male with metastatic prostate cancer. EXAM: MRI TOTAL SPINE WITHOUT AND WITH CONTRAST TECHNIQUE: Multisequence MR imaging of the spine from the cervical spine to the sacrum was performed prior to and following IV contrast administration for evaluation of spinal metastatic disease. CONTRAST:  8 milliliters Gadavist COMPARISON:  Cervical and thoracic MRI 07/16/2018. Lumbar MRI 07/07/2018. FINDINGS: MRI CERVICAL SPINE FINDINGS Alignment: Stable cervical lordosis. Vertebrae: C3 vertebral body metastasis redemonstrated and stable in size, approximately 13 millimeters diameter. No epidural or extraosseous extension. There is a subtle C2 inferior endplate metastasis (series 6, image 7) with no extraosseous extension, increased. No other cervical spine  metastasis; mild degenerative marrow edema suspected in the right C3 inferior articulating facet is also stable. No metastasis at the visible skull base. Cord: Spinal cord signal is within normal limits at all visualized levels. No abnormal intradural enhancement. No dural thickening. Posterior Fossa, vertebral arteries, paraspinal tissues: Cervicomedullary junction is within normal limits. Negative visible posterior fossa. Preserved major vascular flow voids in the neck. The right vertebral artery appears mildly dominant. Disc levels: Stable degenerative changes with spinal stenosis at C3-C4, C5-C6 and C6-C7 again noted. Degenerative cervical neural foraminal stenosis as detailed last month. MRI THORACIC SPINE FINDINGS Segmentation:  Normal. Alignment:  Stable thoracic kyphosis. Vertebrae: Widespread thoracic metastases. A small T2 metastasis is redemonstrated in the midline lamina, and demonstrates early dorsal epidural space extension on series 7, image 7 which appears increased. Posterior vertebral body T4 metastasis with epidural extension to the right of midline measures about 19 millimeters diameter and has not significantly changed. There is no associated spinal stenosis, although there is early  epidural involvement of the right T4 neural foramen on series 30, image 6, stable. Metastasis in the T5 spinous process and enlarging the right costovertebral junction is stable (the latter up to 22 millimeters). There is early extension into the right T5 neural foramen but no associated neural impingement. T6 left inferior pedicle metastasis and posterior left 6th rib metastasis redemonstrated, the former with epidural extension into the left T6 neural foramen on series 29, image 13, stable. Left T7 vertebral body metastasis is stable without extraosseous extension. Bulky left side metastasis with ventral epidural and left T8 neural foraminal extension is stable measuring about 31 millimeters diameter. There is  associated left foraminal stenosis, stable. Possible small T9 posterior inferior endplate metastasis is stable, and there are expansile bilateral posterior 9th rib metastases located near the costovertebral junctions (series 32, image 22). The larger is 24 millimeters. The T10 level appears spared. Superior endplate metastases at E72 and T12 are redemonstrated without extraosseous extension. There is a left 12 costovertebral junction metastasis, stable (series 33, image 33). Cord: Capacious spinal canal. Spinal cord signal is within normal limits at all visualized levels. No abnormal intradural enhancement. No dural thickening. Paraspinal and other soft tissues: Small pleural effusions. Disc levels: No thoracic spinal stenosis despite ventral epidural tumor at T4 and T8. There is stable neural foraminal impingement at the left T6 and T8 levels. MRI LUMBAR SPINE FINDINGS Segmentation:  Normal. Alignment:  Stable mild straightening of lumbar lordosis. Vertebrae: Multilevel lumbar and partially visible sac, T2, ral and pelvic metastases. L1 posterosuperior endplate, right side vertebral body, and right inferior articulating facet metastases re-demonstrated. There is early ventral epidural extension of tumor with the former (series 35, image 10) which has increased since 07/07/2018. The facet metastasis has also increased. Small L2 superior endplate metastasis is stable without extraosseous extension. Three L3 vertebral body metastases, with mildly enlarged involvement at the anterior superior endplate since 09/47/0962, and early extension into the inferior left L3 neural foramen which is stable (series 38, image 16). Previous left L4 laminectomy. A small right L4 vertebral metastasis is minimally increased on series 37, image 5. A midline posterosuperior endplate L5 metastasis with early ventral epidural extension also appears slightly larger on series 38, image 23. At that same level a bulky metastasis in the left  iliac wing with extraosseous extension has increased from 31-45 millimeters over the same time. 24 millimeter left ventral S1 metastasis with early extraosseous extension into the presacral space has also mildly increased. There is no sacral metastasis related neural impingement identified. Conus medullaris: Extends to the T12-L1 level and appears normal. No lower spinal cord or conus signal abnormality. Normal cauda equina nerve roots. No abnormal intradural enhancement or dural thickening. Paraspinal and other soft tissues: Stable visualized abdominal viscera. Disc levels: No lumbar spinal stenosis or malignant neural impingement despite early epidural and foraminal involvement at several levels. IMPRESSION: 1. 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 Epidural and/or neural foraminal extension of tumor at the C3, T4, T6, T8, L1, L3, and L5. But no malignant spinal stenosis or cord compression. Multiple metastases throughout the spine are slightly larger since the MRIs last month, but there is no pathologic vertebral fracture. 2. No intradural metastasis. 3. A large medial left iliac wing metastasis with extraosseous extension of tumor has substantially increased since 07/07/18 (45 mm now versus 31 mm previously). Electronically Signed   By: Genevie Ann M.D.   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 EXAM: MRI TOTAL SPINE WITHOUT AND WITH CONTRAST TECHNIQUE: Multisequence MR imaging of the spine from the cervical spine to the sacrum was performed prior to and following IV contrast administration for evaluation of spinal metastatic disease. CONTRAST:  8 milliliters Gadavist COMPARISON:  Cervical and thoracic MRI 07/16/2018. Lumbar MRI 07/07/2018. FINDINGS: MRI CERVICAL SPINE FINDINGS Alignment: Stable cervical lordosis. Vertebrae: C3 vertebral body metastasis redemonstrated and stable in  size, approximately 13 millimeters diameter. No epidural or extraosseous extension. There is a subtle C2 inferior endplate metastasis (series 6, image 7) with no extraosseous extension, increased. No other cervical spine metastasis; mild degenerative marrow edema suspected in the right C3 inferior articulating facet is also stable. No metastasis at the visible skull base. Cord: Spinal cord signal is within normal limits at all visualized levels. No abnormal intradural enhancement. No dural thickening. Posterior Fossa, vertebral arteries, paraspinal tissues: Cervicomedullary junction is within normal limits. Negative visible posterior fossa. Preserved major vascular flow voids in the neck. The right vertebral artery appears mildly dominant. Disc levels: Stable degenerative changes with spinal stenosis at C3-C4, C5-C6 and C6-C7 again noted. Degenerative cervical neural foraminal stenosis as detailed last month. MRI THORACIC SPINE FINDINGS Segmentation:  Normal. Alignment:  Stable thoracic kyphosis. Vertebrae: Widespread thoracic metastases. A small T2 metastasis is redemonstrated in the midline lamina, and demonstrates early dorsal epidural space extension on series 7, image 7 which appears increased. Posterior vertebral body T4 metastasis with epidural extension to the right of midline measures about 19 millimeters diameter and has not significantly changed. There is no associated spinal stenosis, although there is early epidural involvement of the right T4 neural foramen on series 30, image 6, stable. Metastasis in the T5 spinous process and enlarging the right costovertebral junction is stable (the latter up to 22 millimeters). There is early extension into the right T5 neural foramen but no associated neural impingement. T6 left inferior pedicle metastasis and posterior left 6th rib metastasis redemonstrated, the former with epidural extension into the left T6 neural foramen on series 29, image 13, stable. Left T7  vertebral body metastasis is stable without extraosseous extension. Bulky left side metastasis with ventral epidural and left T8 neural foraminal extension is stable measuring about 31 millimeters diameter. There is associated left foraminal stenosis, stable. Possible small T9 posterior inferior endplate metastasis is stable, and there are expansile bilateral posterior 9th rib metastases located near the costovertebral junctions (series 32, image 22). The larger is 24 millimeters. The T10 level appears spared. Superior endplate metastases at G62 and T12 are redemonstrated without extraosseous extension. There is a left 12 costovertebral junction metastasis, stable (series 33, image 33). Cord: Capacious spinal canal. Spinal cord signal is within normal limits at all visualized levels. No abnormal intradural enhancement. No dural thickening. Paraspinal and other soft tissues: Small pleural effusions. Disc levels: No thoracic spinal stenosis despite ventral epidural tumor at T4 and T8. There is stable neural foraminal impingement at the left T6 and T8 levels. MRI LUMBAR SPINE FINDINGS Segmentation:  Normal. Alignment:  Stable mild straightening of lumbar lordosis. Vertebrae: Multilevel lumbar and partially visible sac, T2, ral and pelvic metastases. L1 posterosuperior endplate, right side vertebral body, and right inferior articulating facet metastases re-demonstrated. There is early ventral epidural extension of tumor with the former (series 35, image 10) which has increased since 07/07/2018. The facet metastasis has also increased. Small L2 superior endplate metastasis is stable without extraosseous extension. Three L3 vertebral  body metastases, with mildly enlarged involvement at the anterior superior endplate since 38/45/3646, and early extension into the inferior left L3 neural foramen which is stable (series 38, image 16). Previous left L4 laminectomy. A small right L4 vertebral metastasis is minimally increased  on series 37, image 5. A midline posterosuperior endplate L5 metastasis with early ventral epidural extension also appears slightly larger on series 38, image 23. At that same level a bulky metastasis in the left iliac wing with extraosseous extension has increased from 31-45 millimeters over the same time. 24 millimeter left ventral S1 metastasis with early extraosseous extension into the presacral space has also mildly increased. There is no sacral metastasis related neural impingement identified. Conus medullaris: Extends to the T12-L1 level and appears normal. No lower spinal cord or conus signal abnormality. Normal cauda equina nerve roots. No abnormal intradural enhancement or dural thickening. Paraspinal and other soft tissues: Stable visualized abdominal viscera. Disc levels: No lumbar spinal stenosis or malignant neural impingement despite early epidural and foraminal involvement at several levels. IMPRESSION: 1. 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 Epidural and/or neural foraminal extension of tumor at the C3, T4, T6, T8, L1, L3, and L5. But no malignant spinal stenosis or cord compression. Multiple metastases throughout the spine are slightly larger since the MRIs last month, but there is no pathologic vertebral fracture. 2. No intradural metastasis. 3. A large medial left iliac wing metastasis with extraosseous extension of tumor has substantially increased since 07/07/18 (45 mm now versus 31 mm previously). Electronically Signed   By: Genevie Ann M.D.   On: 07/20/2018 21:16   Ct Abdomen Pelvis W Contrast  Result Date: 07/20/2018 CLINICAL DATA:  Back pain. EXAM: CT ABDOMEN AND PELVIS WITH CONTRAST TECHNIQUE: Multidetector CT imaging of the abdomen and pelvis was performed using the standard protocol following bolus administration of intravenous contrast. CONTRAST:  185m OMNIPAQUE IOHEXOL 300 MG/ML  SOLN COMPARISON:  CT scan of July 05, 2018.  MRI of July 07, 2018. FINDINGS:  Lower chest: Mild bilateral posterior basilar subsegmental atelectasis is noted. Hepatobiliary: No gallstones or biliary dilatation is noted. Multiple small rounded hypoechoic lesions are seen throughout hepatic parenchyma concerning for metastatic disease. The largest measures 13 mm. Pancreas: Unremarkable. No pancreatic ductal dilatation or surrounding inflammatory changes. Spleen: Normal in size without focal abnormality. Adrenals/Urinary Tract: Adrenal glands appear normal. Bilateral renal cysts are noted. No hydronephrosis or renal obstruction is noted. No renal or ureteral calculi are noted. The bladder is very irregular in contour which may be postsurgical in etiology. Stomach/Bowel: The stomach appears normal. There is no evidence of bowel obstruction or inflammation. Stool is noted throughout the colon. The appendix appears normal. Vascular/Lymphatic: No significant vascular findings are present. No enlarged abdominal or pelvic lymph nodes. Reproductive: No definite mass is noted. Other: No abdominal wall hernia or abnormality. No abdominopelvic ascites. Musculoskeletal: Multiple lytic metastatic lesions are noted in the visualized left ribs, spine and pelvis. The largest lesion is noted in the T8 vertebral body and measures 2.9 cm. It extends slightly posteriorly into the posterior spinal canal. IMPRESSION: Multiple small hepatic metastatic lesions are noted. Multiple lytic metastatic lesions are noted in the visualized ribs, spine and pelvis. The bladder is irregular in contour which most likely is postsurgical in etiology. Stool is noted throughout the colon. Electronically Signed   By: JMarijo ConceptionM.D.   On: 07/20/2018 16:40   Dg Chest Port 1 View  Result Date: 07/20/2018 CLINICAL DATA:  Chest  pain.  Metastatic bladder and prostate cancer. EXAM: PORTABLE CHEST 1 VIEW COMPARISON:  Chest x-ray dated November 12, 2017. FINDINGS: The heart size and mediastinal contours are within normal limits. Normal  pulmonary vascularity. Low lung volumes with mild right basilar atelectasis. No acute osseous abnormality. No focal consolidation, pleural effusion, or pneumothorax. IMPRESSION: Mild right basilar atelectasis.  No active disease. Electronically Signed   By: Titus Dubin M.D.   On: 07/20/2018 11:31        Scheduled Meds:  enoxaparin (LOVENOX) injection  40 mg Subcutaneous Daily   fentaNYL  1 patch Transdermal Q72H   oxyCODONE  10 mg Oral Q12H   predniSONE  20 mg Oral Q breakfast   senna-docusate  1 tablet Oral BID   sodium chloride flush  3 mL Intravenous Q12H   sorbitol, milk of mag, mineral oil, glycerin (SMOG) enema  960 mL Rectal Once   Continuous Infusions:  cefTRIAXone (ROCEPHIN)  IV       LOS: 0 days     Georgette Shell, MD [facility]  If 7PM-7AM, please contact night-coverage [email or web address] Password [email or web address] 07/21/2018, 9:07 AM

## 2018-07-21 NOTE — Progress Notes (Addendum)
Patient states that Dilaudid is not touching his pain.  Pain level is 10 out of 10 and states that the patient is sharp on right side and in chest  Patient is requesting additional pain medication  RN notified provider on call

## 2018-07-21 NOTE — Consult Note (Signed)
Consultation Note Date: 07/21/2018   Patient Name: Rodney [name]  DOB: Dec 08, 1970  MRN: [id number]  Age / Sex: 48 y.o., male  PCP: Pleas Koch [name], MD Referring Physician: Georgette Shell, MD  Reason for Consultation: Pain control  HPI/Patient Profile: 48 y.o. male  with past medical history of widely metastatic disease (biopsy scheduled for tomorrow) admitted on 07/20/2018 with uncontrolled pain.  Palliative care consulted for pain management.   Clinical Assessment and Goals of Care: Palliative consult received.  Chart reviewed including personal review of pertinent labs and imaging.    I met with Rodney [name] and his daughter, Rodney [name].  He reports that his pain is sharp, located in right upper quadrant, radiates to back, worse with certain movements, and somewhat better pain medication.  At home, he was taking Oxycontin and percocet with poor pain management.    In the hospital, he has had fentanyl and is now getting dilaudid 52m Q3 hours.  He does not feel the dilaudid is working as well.  Was on PCA last night and states it was not effective and beeping kept him up all night.    His daughter reports many questions about care plan.  We reviewed his case, chart, imaging, and care plan in detail.  Discussed that he has widely metastatic disease and we will know more about potential treatments once biopsy returns.  He is invested in plan for aggressive care at this point.  SUMMARY OF RECOMMENDATIONS   - Pain: Started on fentanyl patch 581m this AM.  Will plan to increase steroids to dexamethasone 53m83mID and transition back from dilaudid (he reports less effective than fentanyl) to fentnayl 53m12mvery 2 hours as needed. - Discussed the fact that he has widely metastatic disease.  His goal is to have biopsy and then follow-up with Dr. ShadAlen Blewdiscuss options for treatment. - He reports that he would  like to have his daughter, Rodney Connt as his surrogate if he cannot make his own decisions.  Code Status/Advance Care Planning:  Full code  Prognosis:   Unable to determine  Discharge Planning: To Be Determined      Primary Diagnoses: Present on Admission: . Intractable pain . Malignant ne[place] of [place] ([place]CC[place] . Acute lower UTI . Leukocytosis   I have reviewed the medical record, interviewed the patient and family, and examined the patient. The following aspects are pertinent.  Past Medical History:  Diagnosis Date  . Anxiety   . Bladder tumor   . Blood in urine    saw some 3 days ago but none inthe last 2 days   . Bradycardia   . Cancer (HCC)[place]. Depression   . Dizziness    historical   . Elevated PSA   . Family history of colon cancer   . Family history of gastric cancer   . Family history of melanoma   . Family history of prostate cancer   . Heartburn    occasional   .  Pain with urination   . Prostate cancer ([place])   . Urothelial cancer ([place])   . 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 Marital status: Significant Other    Spouse name: Not on file  . Number of children: Not on file  . Years of education: Not on file  . Highest education level: Not on file  Occupational History  . Not on file  Social Needs  . Financial resource strain: Not on file  . Food insecurity    Worry: Not on file    Inability: Not on file  . Transportation needs    Medical: Not on file    Non-medical: Not on file  Tobacco Use  . Smoking status: Former Smoker    Packs/day: 2.00    Years: 8.00    Pack years: 16.00    Types: Cigarettes    Quit date: 05/29/2016    Years since quitting: 2.1  . Smokeless tobacco: Former Systems developer  . Tobacco comment: quti 2009  Substance and Sexual Activity  . Alcohol use: Not Currently  . Drug use: Not Currently    Types: Marijuana    Comment: last use was  05-29-2017  . Sexual activity: Yes  Lifestyle  . Physical activity    Days per week: Not on file    Minutes per session: Not on file  . Stress: Not on file  Relationships  . Social Herbalist on phone: Not on file    Gets together: Not on file    Attends religious service: Not on file    Active member of club or organization: Not on file    Attends meetings of clubs or organizations: Not on file    Relationship status: Not on file  Other Topics Concern  . Not on file  Social History Narrative  . Not on file   Family History  Problem Relation Age of Onset  . Heart attack Father   . Hypertension Father   . Diabetes Father   . Heart failure Father   . Hypertension Mother   . Gastric cancer Sister 68       d. 32  . Prostate cancer Brother 41  . Melanoma Maternal Aunt   . Stroke Maternal Uncle   . Stroke Paternal Uncle   . Melanoma Maternal Grandmother        dx under 52  . Colon cancer Maternal Grandmother   . Heart disease Maternal Grandfather   . Heart attack Paternal Grandfather    Scheduled Meds: . bisacodyl  10 mg Oral Daily  . dexamethasone (DECADRON) injection  4 mg Intravenous BID  . enoxaparin (LOVENOX) injection  40 mg Subcutaneous Daily  . fentaNYL  1 patch Transdermal Q72H  . [START ON 07/22/2018] polyethylene glycol  17 g Oral Daily  . senna-docusate  2 tablet Oral BID  . sodium chloride flush  3 mL Intravenous Q12H   Continuous Infusions: . sodium chloride 100 mL/hr at 07/21/18 2208  . cefTRIAXone (ROCEPHIN)  IV 1 g (07/21/18 2204)   PRN Meds:.acetaminophen **OR** acetaminophen, albuterol, fentaNYL (SUBLIMAZE) injection, ondansetron **OR** ondansetron (ZOFRAN) IV Medications Prior to Admission:  Prior to Admission medications   Medication Sig Start Date End Date Taking? Authorizing Provider  cyanocobalamin (,VITAMIN B-12,) 1000 MCG/ML injection Inject 1,000 mcg into the muscle every 30 (thirty) days.   Yes [provider]   cyclobenzaprine (FLEXERIL) 10 MG tablet Take 10 mg by mouth 3 (three)  times daily.   Yes [provider]  Multiple Vitamin (MULTIVITAMIN WITH MINERALS) TABS tablet Take 1 tablet by mouth daily.   Yes [provider]  naproxen sodium (ALEVE) 220 MG tablet Take 220-440 mg by mouth 2 (two) times daily as needed.   Yes [provider]  Oxycodone HCl 10 MG TABS Take 1 tablet by mouth every 4 (four) hours as needed. 07/18/18  Yes [provider]  oxyCODONE-acetaminophen (PERCOCET) 10-325 MG tablet Take 1 tablet by mouth every 4 (four) hours as needed for pain. Patient taking differently: Take 1 tablet by mouth every 4 (four) hours.  07/12/18  Yes Shadad, Mathis Dad, MD  OXYCONTIN 10 MG 12 hr tablet Take 1 tablet by mouth every 12 (twelve) hours. 07/18/18  Yes [provider]  predniSONE (DELTASONE) 10 MG tablet Take 2 tablets (20 mg total) by mouth daily. 07/12/18  Yes [name], MD  sildenafil (VIAGRA) 100 MG tablet Take 100 mg by mouth daily as needed. 03/22/18  Yes [provider]  vitamin B-12 (CYANOCOBALAMIN) 1000 MCG tablet Take 1,000 mcg by mouth 2 (two) times a day.   Yes [provider]   Allergies  Allergen Reactions  . Morphine And Related Itching  . Vicodin [Hydrocodone-Acetaminophen] Itching   Review of Systems  Constitutional: Positive for activity change and fatigue.  Gastrointestinal: Positive for abdominal pain.  Neurological: Positive for weakness.   Physical Exam General: Alert, awake, in no moderate distress from pain.  HEENT: No bruits, no goiter, no JVD Heart: Tachycardic. No murmur appreciated. Lungs: Good air movement, clear Abdomen: Soft, nontender, nondistended, positive bowel sounds.  Ext: No significant edema Skin: Warm and dry Neuro: Grossly intact, nonfocal.  Vital Signs: BP 123/73 (BP Location: Right Arm)   Pulse 96   Temp 97.8 F (36.6 C) (Oral)   Resp 20   Ht 6' 2" (1.88 m)   Wt 83.5 kg    SpO2 97%   BMI 23.62 kg/m  Pain Scale: 0-10 POSS *See Group Information*: 1-Acceptable,Awake and alert Pain Score: Asleep   SpO2: SpO2: 97 % O2 Device:SpO2: 97 % O2 Flow Rate: .O2 Flow Rate (L/min): 2 L/min  IO: Intake/output summary:   Intake/Output Summary (Last 24 hours) at 07/21/2018 2325 Last data filed at 07/21/2018 1303 Gross per 24 hour  Intake -  Output 1900 ml  Net -1900 ml    LBM: Last BM Date: 07/21/18 Baseline Weight: Weight: 83.5 kg Most recent weight: Weight: 83.5 kg     Palliative Assessment/Data:   Flowsheet Rows     Most Recent Value  Intake Tab  Referral Department  Hospitalist  Unit at Time of Referral  Med/Surg Unit  Palliative Care Primary Diagnosis  Cancer  Date Notified  07/21/18  Palliative Care Type  New Palliative care  Reason for referral  Pain  Date of Admission  07/20/18  # of days IP prior to Palliative referral  1  Clinical Assessment  Palliative Performance Scale Score  60%  Psychosocial & Spiritual Assessment  Palliative Care Outcomes      Time In: 1440 Time Out: 1600 Time Total: 80 Greater than 50%  of this time was spent counseling and coordinating care related to the above assessment and plan.  Signed by: Micheline Rough, MD   Please contact Palliative Medicine Team phone at [phone] for questions and concerns.  For individual provider: See Shea Evans

## 2018-07-22 ENCOUNTER — Encounter (HOSPITAL_COMMUNITY): Payer: Self-pay

## 2018-07-22 ENCOUNTER — Ambulatory Visit (HOSPITAL_COMMUNITY)
Admission: RE | Admit: 2018-07-22 | Discharge: 2018-07-22 | Disposition: A | Discharge status: Home / Self Care | Payer: PRIVATE HEALTH INSURANCE | Source: Ambulatory Visit | Attending: Oncology | Admitting: Oncology

## 2018-07-22 ENCOUNTER — Inpatient Hospital Stay (HOSPITAL_COMMUNITY): Payer: PRIVATE HEALTH INSURANCE

## 2018-07-22 DIAGNOSIS — C61 Malignant neoplasm of prostate: Secondary | ICD-10-CM | POA: Insufficient documentation

## 2018-07-22 DIAGNOSIS — C689 Malignant neoplasm of urinary organ, unspecified: Secondary | ICD-10-CM | POA: Insufficient documentation

## 2018-07-22 DIAGNOSIS — G893 Neoplasm related pain (acute) (chronic): Secondary | ICD-10-CM

## 2018-07-22 LAB — COMPREHENSIVE METABOLIC PANEL
ALT: 16 U/L (ref 0–44)
AST: 17 U/L (ref 15–41)
Albumin: 2.4 g/dL — ABNORMAL LOW (ref 3.5–5.0)
Alkaline Phosphatase: 110 U/L (ref 38–126)
Anion gap: 9 (ref 5–15)
BUN: 23 mg/dL — ABNORMAL HIGH (ref 6–20)
CO2: 26 mmol/L (ref 22–32)
Calcium: 10.2 mg/dL (ref 8.9–10.3)
Chloride: 99 mmol/L (ref 98–111)
Creatinine, Ser: 0.78 mg/dL (ref 0.61–1.24)
GFR calc Af Amer: >60 mL/min (ref >60)
GFR calc non Af Amer: >60 mL/min (ref >60)
Glucose, Bld: 117 mg/dL — ABNORMAL HIGH (ref 70–99)
Potassium: 4.8 mmol/L (ref 3.5–5.1)
Sodium: 134 mmol/L — ABNORMAL LOW (ref 135–145)
Total Bilirubin: 0.6 mg/dL (ref 0.3–1.2)
Total Protein: 6.7 g/dL (ref 6.5–8.1)

## 2018-07-22 MED ORDER — MIDAZOLAM HCL 2 MG/2ML IJ SOLN
INTRAMUSCULAR | Status: AC | PRN
  Administered 2018-07-22 (×2): 0.5 mg via INTRAVENOUS
  Administered 2018-07-22: 1 mg via INTRAVENOUS

## 2018-07-22 MED ORDER — FENTANYL CITRATE (PF) 100 MCG/2ML IJ SOLN
100.0000 ug | INTRAMUSCULAR | Status: DC | PRN
  Administered 2018-07-22 – 2018-07-23 (×8): 100 ug via INTRAVENOUS
  Filled 2018-07-22 (×8): qty 2

## 2018-07-22 MED ORDER — MIDAZOLAM HCL 2 MG/2ML IJ SOLN
INTRAMUSCULAR | Status: AC
  Filled 2018-07-22: qty 4

## 2018-07-22 MED ORDER — ENOXAPARIN SODIUM 40 MG/0.4ML ~~LOC~~ SOLN
40.0000 mg | Freq: Every day | SUBCUTANEOUS | Status: DC
  Administered 2018-07-23 – 2018-08-02 (×11): 40 mg via SUBCUTANEOUS
  Filled 2018-07-22 (×11): qty 0.4

## 2018-07-22 MED ORDER — CYANOCOBALAMIN 1000 MCG/ML IJ SOLN
1000.0000 ug | Freq: Once | INTRAMUSCULAR | Status: AC
  Administered 2018-07-22: 1000 ug via INTRAMUSCULAR
  Filled 2018-07-22: qty 1

## 2018-07-22 MED ORDER — FENTANYL CITRATE (PF) 100 MCG/2ML IJ SOLN
INTRAMUSCULAR | Status: AC | PRN
  Administered 2018-07-22: 50 ug via INTRAVENOUS
  Administered 2018-07-22 (×3): 25 ug via INTRAVENOUS

## 2018-07-22 MED ORDER — SODIUM CHLORIDE 0.9 % IV SOLN
INTRAVENOUS | Status: AC | PRN
  Administered 2018-07-22: 10 mL/h via INTRAVENOUS

## 2018-07-22 MED ORDER — LIDOCAINE HCL 1 % IJ SOLN
INTRAMUSCULAR | Status: AC
  Filled 2018-07-22: qty 20

## 2018-07-22 MED ORDER — FENTANYL CITRATE (PF) 100 MCG/2ML IJ SOLN
INTRAMUSCULAR | Status: AC
  Filled 2018-07-22: qty 4

## 2018-07-22 NOTE — Plan of Care (Signed)
  Problem: Elimination: Goal: Will not experience complications related to bowel motility Outcome: Progressing   

## 2018-07-22 NOTE — Consult Note (Signed)
Chief Complaint: Patient was seen in consultation today for left iliac bone lesion biopsy Chief Complaint  Patient presents with   pain management   Chest Pain   Back Pain   at the request of Dr Coralee Pesa  Referring Physician(s):Dr [name]  Supervising Physician: Markus Daft  Patient Status: [facility] - In-pt  History of Present Illness: Rodney [name] is a 48 y.o. male   Urothelial cancer Bladder tumor/ prostate cancer Sever back pain and body pain Home pain meds; steroids- no relief  MR 7/4:  IMPRESSION: 1. 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 Epidural and/or neural foraminal extension of tumor at the C3, T4, T6, T8, L1, L3, and L5. But no malignant spinal stenosis or cord compression. Multiple metastases throughout the spine are slightly larger since the MRIs last month, but there is no pathologic vertebral fracture. 2. No intradural metastasis. 3. A large medial left iliac wing metastasis with extraosseous extension of tumor has substantially increased since 07/07/18 (45 mm now versus 31 mm previously).  Was scheduled for left iliac bone biopsy as OP today Was admitted over weekend for pain control Now scheduled for bx today per MD request Approved by IR Rad  Past Medical History:  Diagnosis Date   Anxiety    Bladder tumor    Blood in urine    saw some 3 days ago but none inthe last 2 days    Bradycardia    Cancer ([place])    Depression    Dizziness    historical    Elevated PSA    Family history of colon cancer    Family history of gastric cancer    Family history of melanoma    Family history of prostate cancer    Heartburn    occasional    Pain with urination    Prostate cancer ([place])    Urothelial cancer ([place])    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[place])     Past Surgical History:  Procedure Laterality Date   BACK SURGERY  2012   3 disc; dr Carloyn Manner  did first and dr elsner did the last 2    CYSTOSCOPY W/  RETROGRADES N/A 07/02/2017   Procedure: CYSTOSCOPY WITH RETROGRADE PYELOGRAM/EXAM UNDER ANESTHESIA;  Surgeon: Raynelle Bring, MD;  Location: [facility];  Service: Urology;  Laterality: N/A;   CYSTOSCOPY WITH BIOPSY N/A 08/09/2017   Procedure: CYSTOSCOPY WITH PROSTATE NEEDLE BIOPSY;  Surgeon: Raynelle Bring, MD;  Location: [facility];  Service: Urology;  Laterality: N/A;  GENERAL ANESTHESIA WITH PARALYSIS/ ONLY NEEDS 60 MIN FOR ALL PROCEDURES   LEG SURGERY  2012   4 metal plates in plates    TRANSRECTAL ULTRASOUND N/A 08/09/2017   Procedure: TRANSRECTAL ULTRASOUND;  Surgeon: Raynelle Bring, MD;  Location: [facility];  Service: Urology;  Laterality: N/A;  ONLY NEEDS 60 MIN FOR ALL PROCEDURES   TRANSURETHRAL RESECTION OF BLADDER TUMOR N/A 07/02/2017   Procedure: TRANSURETHRAL RESECTION OF BLADDER TUMOR (TURBT);  Surgeon: Raynelle Bring, MD;  Location: [facility];  Service: Urology;  Laterality: N/A;   TRANSURETHRAL RESECTION OF BLADDER TUMOR N/A 08/09/2017   Procedure: TRANSURETHRAL RESECTION OF BLADDER TUMOR (TURBT);  Surgeon: Raynelle Bring, MD;  Location: [facility];  Service: Urology;  Laterality: N/A;    Allergies: Morphine and related and Vicodin [hydrocodone-acetaminophen]  Medications: Prior to Admission medications   Medication Sig Start Date End Date Taking? Authorizing Provider  cyanocobalamin (,VITAMIN B-12,) 1000 MCG/ML injection Inject 1,000 mcg into the muscle every 30 (thirty) days.  Yes [provider]  cyclobenzaprine (FLEXERIL) 10 MG tablet Take 10 mg by mouth 3 (three) times daily.   Yes [provider]  Multiple Vitamin (MULTIVITAMIN WITH MINERALS) TABS tablet Take 1 tablet by mouth daily.   Yes [provider]  naproxen sodium (ALEVE) 220 MG tablet Take 220-440 mg by mouth 2 (two) times daily as needed.   Yes [provider]  Oxycodone HCl 10 MG TABS Take 1 tablet by mouth every 4 (four) hours as needed. 07/18/18  Yes [provider]    oxyCODONE-acetaminophen (PERCOCET) 10-325 MG tablet Take 1 tablet by mouth every 4 (four) hours as needed for pain. Patient taking differently: Take 1 tablet by mouth every 4 (four) hours.  07/12/18  Yes Shadad, Mathis Dad, MD  OXYCONTIN 10 MG 12 hr tablet Take 1 tablet by mouth every 12 (twelve) hours. 07/18/18  Yes [provider]  predniSONE (DELTASONE) 10 MG tablet Take 2 tablets (20 mg total) by mouth daily. 07/12/18  Yes [name], MD  sildenafil (VIAGRA) 100 MG tablet Take 100 mg by mouth daily as needed. 03/22/18  Yes [provider]  vitamin B-12 (CYANOCOBALAMIN) 1000 MCG tablet Take 1,000 mcg by mouth 2 (two) times a day.   Yes [provider]     Family History  Problem Relation Age of Onset   Heart attack Father    Hypertension Father    Diabetes Father    Heart failure Father    Hypertension Mother    Gastric cancer Sister 61       d. 26   Prostate cancer Brother 50   Melanoma Maternal Aunt    Stroke Maternal Uncle    Stroke Paternal Uncle    Melanoma Maternal Grandmother        dx under 65   Colon cancer Maternal Grandmother    Heart disease Maternal Grandfather    Heart attack Paternal Grandfather     Social History   Socioeconomic History   Marital status: Significant Other    Spouse name: Not on file   Number of children: Not on file   Years of education: Not on file   Highest education level: Not on file  Occupational History   Not on file  Social Needs   Financial resource strain: Not on file   Food insecurity    Worry: Not on file    Inability: Not on file   Transportation needs    Medical: Not on file    Non-medical: Not on file  Tobacco Use   Smoking status: Former Smoker    Packs/day: 2.00    Years: 8.00    Pack years: 16.00    Types: Cigarettes    Quit date: 05/29/2016    Years since quitting: 2.1   Smokeless tobacco: Former Systems developer   Tobacco comment: [place] 2009  Substance and Sexual Activity    Alcohol use: Not Currently   Drug use: Not Currently    Types: Marijuana    Comment: last use was 05-29-2017   Sexual activity: Yes  Lifestyle   Physical activity    Days per week: Not on file    Minutes per session: Not on file   Stress: Not on file  Relationships   Social connections    Talks on phone: Not on file    Gets together: Not on file    Attends religious service: Not on file    Active member of club or organization: Not on file  Attends meetings of clubs or organizations: Not on file    Relationship status: Not on file  Other Topics Concern   Not on file  Social History Narrative   Not on file    Review of Systems: A 12 point ROS discussed and pertinent positives are indicated in the HPI above.  All other systems are negative.  Review of Systems  Constitutional: Positive for activity change, appetite change and fatigue.  Respiratory: Negative for shortness of breath.   Cardiovascular: Negative for chest pain.  Gastrointestinal: Positive for abdominal pain and nausea.  Musculoskeletal: Positive for back pain and gait problem.  Neurological: Positive for weakness.  Psychiatric/Behavioral: Negative for behavioral problems and confusion.    Vital Signs: BP (!) 137/97 (BP Location: Right Arm)    Pulse 93    Temp 97.9 F (36.6 C) (Oral)    Resp 18    Ht 6\' 2"  (1.88 m)    Wt 184 lb (83.5 kg)    SpO2 95%    BMI 23.62 kg/m   Physical Exam Vitals signs reviewed.  Cardiovascular:     Rate and Rhythm: Normal rate and regular rhythm.     Heart sounds: Normal heart sounds.  Pulmonary:     Effort: Pulmonary effort is normal.  Abdominal:     Palpations: Abdomen is soft.  Skin:    General: Skin is warm and dry.  Neurological:     Mental Status: He is alert and oriented to person, place, and time.  Psychiatric:        Behavior: Behavior normal.     Imaging: Mr Cervical Spine W Or Wo Contrast  Result Date: 07/20/2018 CLINICAL DATA:  48 year old male  with metastatic prostate cancer. EXAM: MRI TOTAL SPINE WITHOUT AND WITH CONTRAST TECHNIQUE: Multisequence MR imaging of the spine from the cervical spine to the sacrum was performed prior to and following IV contrast administration for evaluation of spinal metastatic disease. CONTRAST:  8 milliliters Gadavist COMPARISON:  Cervical and thoracic MRI 07/16/2018. Lumbar MRI 07/07/2018. FINDINGS: MRI CERVICAL SPINE FINDINGS Alignment: Stable cervical lordosis. Vertebrae: C3 vertebral body metastasis redemonstrated and stable in size, approximately 13 millimeters diameter. No epidural or extraosseous extension. There is a subtle C2 inferior endplate metastasis (series 6, image 7) with no extraosseous extension, increased. No other cervical spine metastasis; mild degenerative marrow edema suspected in the right C3 inferior articulating facet is also stable. No metastasis at the visible skull base. Cord: Spinal cord signal is within normal limits at all visualized levels. No abnormal intradural enhancement. No dural thickening. Posterior Fossa, vertebral arteries, paraspinal tissues: Cervicomedullary junction is within normal limits. Negative visible posterior fossa. Preserved major vascular flow voids in the neck. The right vertebral artery appears mildly dominant. Disc levels: Stable degenerative changes with spinal stenosis at C3-C4, C5-C6 and C6-C7 again noted. Degenerative cervical neural foraminal stenosis as detailed last month. MRI THORACIC SPINE FINDINGS Segmentation:  Normal. Alignment:  Stable thoracic kyphosis. Vertebrae: Widespread thoracic metastases. A small T2 metastasis is redemonstrated in the midline lamina, and demonstrates early dorsal epidural space extension on series 7, image 7 which appears increased. Posterior vertebral body T4 metastasis with epidural extension to the right of midline measures about 19 millimeters diameter and has not significantly changed. There is no associated spinal stenosis,  although there is early epidural involvement of the right T4 neural foramen on series 30, image 6, stable. Metastasis in the T5 spinous process and enlarging the right costovertebral junction is stable (the latter up to  22 millimeters). There is early extension into the right T5 neural foramen but no associated neural impingement. T6 left inferior pedicle metastasis and posterior left 6th rib metastasis redemonstrated, the former with epidural extension into the left T6 neural foramen on series 29, image 13, stable. Left T7 vertebral body metastasis is stable without extraosseous extension. Bulky left side metastasis with ventral epidural and left T8 neural foraminal extension is stable measuring about 31 millimeters diameter. There is associated left foraminal stenosis, stable. Possible small T9 posterior inferior endplate metastasis is stable, and there are expansile bilateral posterior 9th rib metastases located near the costovertebral junctions (series 32, image 22). The larger is 24 millimeters. The T10 level appears spared. Superior endplate metastases at X52 and T12 are redemonstrated without extraosseous extension. There is a left 12 costovertebral junction metastasis, stable (series 33, image 33). Cord: Capacious spinal canal. Spinal cord signal is within normal limits at all visualized levels. No abnormal intradural enhancement. No dural thickening. Paraspinal and other soft tissues: Small pleural effusions. Disc levels: No thoracic spinal stenosis despite ventral epidural tumor at T4 and T8. There is stable neural foraminal impingement at the left T6 and T8 levels. MRI LUMBAR SPINE FINDINGS Segmentation:  Normal. Alignment:  Stable mild straightening of lumbar lordosis. Vertebrae: Multilevel lumbar and partially visible sac, T2, ral and pelvic metastases. L1 posterosuperior endplate, right side vertebral body, and right inferior articulating facet metastases re-demonstrated. There is early ventral  epidural extension of tumor with the former (series 35, image 10) which has increased since 07/07/2018. The facet metastasis has also increased. Small L2 superior endplate metastasis is stable without extraosseous extension. Three L3 vertebral body metastases, with mildly enlarged involvement at the anterior superior endplate since 84/13/2440, and early extension into the inferior left L3 neural foramen which is stable (series 38, image 16). Previous left L4 laminectomy. A small right L4 vertebral metastasis is minimally increased on series 37, image 5. A midline posterosuperior endplate L5 metastasis with early ventral epidural extension also appears slightly larger on series 38, image 23. At that same level a bulky metastasis in the left iliac wing with extraosseous extension has increased from 31-45 millimeters over the same time. 24 millimeter left ventral S1 metastasis with early extraosseous extension into the presacral space has also mildly increased. There is no sacral metastasis related neural impingement identified. Conus medullaris: Extends to the T12-L1 level and appears normal. No lower spinal cord or conus signal abnormality. Normal cauda equina nerve roots. No abnormal intradural enhancement or dural thickening. Paraspinal and other soft tissues: Stable visualized abdominal viscera. Disc levels: No lumbar spinal stenosis or malignant neural impingement despite early epidural and foraminal involvement at several levels. IMPRESSION: 1. 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 Epidural and/or neural foraminal extension of tumor at the C3, T4, T6, T8, L1, L3, and L5. But no malignant spinal stenosis or cord compression. Multiple metastases throughout the spine are slightly larger since the MRIs last month, but there is no pathologic vertebral fracture. 2. No intradural metastasis. 3. A large medial left iliac wing metastasis with extraosseous extension of tumor has substantially  increased since 07/07/18 (45 mm now versus 31 mm previously). Electronically Signed   By: Genevie Ann M.D.   On: 07/20/2018 21:16   Mr Thoracic Spine W Wo Contrast  Result Date: 07/20/2018 CLINICAL DATA:  48 year old male with metastatic prostate cancer. EXAM: MRI TOTAL SPINE WITHOUT AND WITH CONTRAST TECHNIQUE: Multisequence MR imaging of the spine from the cervical spine  to the sacrum was performed prior to and following IV contrast administration for evaluation of spinal metastatic disease. CONTRAST:  8 milliliters Gadavist COMPARISON:  Cervical and thoracic MRI 07/16/2018. Lumbar MRI 07/07/2018. FINDINGS: MRI CERVICAL SPINE FINDINGS Alignment: Stable cervical lordosis. Vertebrae: C3 vertebral body metastasis redemonstrated and stable in size, approximately 13 millimeters diameter. No epidural or extraosseous extension. There is a subtle C2 inferior endplate metastasis (series 6, image 7) with no extraosseous extension, increased. No other cervical spine metastasis; mild degenerative marrow edema suspected in the right C3 inferior articulating facet is also stable. No metastasis at the visible skull base. Cord: Spinal cord signal is within normal limits at all visualized levels. No abnormal intradural enhancement. No dural thickening. Posterior Fossa, vertebral arteries, paraspinal tissues: Cervicomedullary junction is within normal limits. Negative visible posterior fossa. Preserved major vascular flow voids in the neck. The right vertebral artery appears mildly dominant. Disc levels: Stable degenerative changes with spinal stenosis at C3-C4, C5-C6 and C6-C7 again noted. Degenerative cervical neural foraminal stenosis as detailed last month. MRI THORACIC SPINE FINDINGS Segmentation:  Normal. Alignment:  Stable thoracic kyphosis. Vertebrae: Widespread thoracic metastases. A small T2 metastasis is redemonstrated in the midline lamina, and demonstrates early dorsal epidural space extension on series 7, image 7 which  appears increased. Posterior vertebral body T4 metastasis with epidural extension to the right of midline measures about 19 millimeters diameter and has not significantly changed. There is no associated spinal stenosis, although there is early epidural involvement of the right T4 neural foramen on series 30, image 6, stable. Metastasis in the T5 spinous process and enlarging the right costovertebral junction is stable (the latter up to 22 millimeters). There is early extension into the right T5 neural foramen but no associated neural impingement. T6 left inferior pedicle metastasis and posterior left 6th rib metastasis redemonstrated, the former with epidural extension into the left T6 neural foramen on series 29, image 13, stable. Left T7 vertebral body metastasis is stable without extraosseous extension. Bulky left side metastasis with ventral epidural and left T8 neural foraminal extension is stable measuring about 31 millimeters diameter. There is associated left foraminal stenosis, stable. Possible small T9 posterior inferior endplate metastasis is stable, and there are expansile bilateral posterior 9th rib metastases located near the costovertebral junctions (series 32, image 22). The larger is 24 millimeters. The T10 level appears spared. Superior endplate metastases at G40 and T12 are redemonstrated without extraosseous extension. There is a left 12 costovertebral junction metastasis, stable (series 33, image 33). Cord: Capacious spinal canal. Spinal cord signal is within normal limits at all visualized levels. No abnormal intradural enhancement. No dural thickening. Paraspinal and other soft tissues: Small pleural effusions. Disc levels: No thoracic spinal stenosis despite ventral epidural tumor at T4 and T8. There is stable neural foraminal impingement at the left T6 and T8 levels. MRI LUMBAR SPINE FINDINGS Segmentation:  Normal. Alignment:  Stable mild straightening of lumbar lordosis. Vertebrae:  Multilevel lumbar and partially visible sac, T2, ral and pelvic metastases. L1 posterosuperior endplate, right side vertebral body, and right inferior articulating facet metastases re-demonstrated. There is early ventral epidural extension of tumor with the former (series 35, image 10) which has increased since 07/07/2018. The facet metastasis has also increased. Small L2 superior endplate metastasis is stable without extraosseous extension. Three L3 vertebral body metastases, with mildly enlarged involvement at the anterior superior endplate since 11/12/2534, and early extension into the inferior left L3 neural foramen which is stable (series 38, image 16). Previous  left L4 laminectomy. A small right L4 vertebral metastasis is minimally increased on series 37, image 5. A midline posterosuperior endplate L5 metastasis with early ventral epidural extension also appears slightly larger on series 38, image 23. At that same level a bulky metastasis in the left iliac wing with extraosseous extension has increased from 31-45 millimeters over the same time. 24 millimeter left ventral S1 metastasis with early extraosseous extension into the presacral space has also mildly increased. There is no sacral metastasis related neural impingement identified. Conus medullaris: Extends to the T12-L1 level and appears normal. No lower spinal cord or conus signal abnormality. Normal cauda equina nerve roots. No abnormal intradural enhancement or dural thickening. Paraspinal and other soft tissues: Stable visualized abdominal viscera. Disc levels: No lumbar spinal stenosis or malignant neural impingement despite early epidural and foraminal involvement at several levels. IMPRESSION: 1. 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 Epidural and/or neural foraminal extension of tumor at the C3, T4, T6, T8, L1, L3, and L5. But no malignant spinal stenosis or cord compression. Multiple metastases throughout the spine  are slightly larger since the MRIs last month, but there is no pathologic vertebral fracture. 2. No intradural metastasis. 3. A large medial left iliac wing metastasis with extraosseous extension of tumor has substantially increased since 07/07/18 (45 mm now versus 31 mm previously). Electronically Signed   By: Genevie Ann M.D.   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 EXAM: MRI TOTAL SPINE WITHOUT AND WITH CONTRAST TECHNIQUE: Multisequence MR imaging of the spine from the cervical spine to the sacrum was performed prior to and following IV contrast administration for evaluation of spinal metastatic disease. CONTRAST:  8 milliliters Gadavist COMPARISON:  Cervical and thoracic MRI 07/16/2018. Lumbar MRI 07/07/2018. FINDINGS: MRI CERVICAL SPINE FINDINGS Alignment: Stable cervical lordosis. Vertebrae: C3 vertebral body metastasis redemonstrated and stable in size, approximately 13 millimeters diameter. No epidural or extraosseous extension. There is a subtle C2 inferior endplate metastasis (series 6, image 7) with no extraosseous extension, increased. No other cervical spine metastasis; mild degenerative marrow edema suspected in the right C3 inferior articulating facet is also stable. No metastasis at the visible skull base. Cord: Spinal cord signal is within normal limits at all visualized levels. No abnormal intradural enhancement. No dural thickening. Posterior Fossa, vertebral arteries, paraspinal tissues: Cervicomedullary junction is within normal limits. Negative visible posterior fossa. Preserved major vascular flow voids in the neck. The right vertebral artery appears mildly dominant. Disc levels: Stable degenerative changes with spinal stenosis at C3-C4, C5-C6 and C6-C7 again noted. Degenerative cervical neural foraminal stenosis as detailed last month. MRI THORACIC SPINE FINDINGS Segmentation:  Normal.  Alignment:  Stable thoracic kyphosis. Vertebrae: Widespread thoracic metastases. A small T2 metastasis is redemonstrated in the midline lamina, and demonstrates early dorsal epidural space extension on series 7, image 7 which appears increased. Posterior vertebral body T4 metastasis with epidural extension to the right of midline measures about 19 millimeters diameter and has not significantly changed. There is no associated spinal stenosis, although there is early epidural involvement of the right T4 neural foramen on series 30, image 6, stable. Metastasis in the T5 spinous process and enlarging the right costovertebral junction is stable (the latter up to 22 millimeters). There is early extension into the right T5 neural foramen but no associated neural impingement. T6 left inferior pedicle metastasis and posterior left 6th rib metastasis  redemonstrated, the former with epidural extension into the left T6 neural foramen on series 29, image 13, stable. Left T7 vertebral body metastasis is stable without extraosseous extension. Bulky left side metastasis with ventral epidural and left T8 neural foraminal extension is stable measuring about 31 millimeters diameter. There is associated left foraminal stenosis, stable. Possible small T9 posterior inferior endplate metastasis is stable, and there are expansile bilateral posterior 9th rib metastases located near the costovertebral junctions (series 32, image 22). The larger is 24 millimeters. The T10 level appears spared. Superior endplate metastases at N82 and T12 are redemonstrated without extraosseous extension. There is a left 12 costovertebral junction metastasis, stable (series 33, image 33). Cord: Capacious spinal canal. Spinal cord signal is within normal limits at all visualized levels. No abnormal intradural enhancement. No dural thickening. Paraspinal and other soft tissues: Small pleural effusions. Disc levels: No thoracic spinal stenosis despite ventral  epidural tumor at T4 and T8. There is stable neural foraminal impingement at the left T6 and T8 levels. MRI LUMBAR SPINE FINDINGS Segmentation:  Normal. Alignment:  Stable mild straightening of lumbar lordosis. Vertebrae: Multilevel lumbar and partially visible sac, T2, ral and pelvic metastases. L1 posterosuperior endplate, right side vertebral body, and right inferior articulating facet metastases re-demonstrated. There is early ventral epidural extension of tumor with the former (series 35, image 10) which has increased since 07/07/2018. The facet metastasis has also increased. Small L2 superior endplate metastasis is stable without extraosseous extension. Three L3 vertebral body metastases, with mildly enlarged involvement at the anterior superior endplate since 95/62/1308, and early extension into the inferior left L3 neural foramen which is stable (series 38, image 16). Previous left L4 laminectomy. A small right L4 vertebral metastasis is minimally increased on series 37, image 5. A midline posterosuperior endplate L5 metastasis with early ventral epidural extension also appears slightly larger on series 38, image 23. At that same level a bulky metastasis in the left iliac wing with extraosseous extension has increased from 31-45 millimeters over the same time. 24 millimeter left ventral S1 metastasis with early extraosseous extension into the presacral space has also mildly increased. There is no sacral metastasis related neural impingement identified. Conus medullaris: Extends to the T12-L1 level and appears normal. No lower spinal cord or conus signal abnormality. Normal cauda equina nerve roots. No abnormal intradural enhancement or dural thickening. Paraspinal and other soft tissues: Stable visualized abdominal viscera. Disc levels: No lumbar spinal stenosis or malignant neural impingement despite early epidural and foraminal involvement at several levels. IMPRESSION: 1. Widespread spinal and partially  visible rib and pelvic metastases. Epidural and/or neural foraminal extension of tumor at the C3, T4, T6, T8, L1, L3, and L5. But no malignant spinal stenosis or cord compression. Multiple metastases throughout the spine are slightly larger since the MRIs last month, but there is no pathologic vertebral fracture. 2. No intradural metastasis. 3. A large medial left iliac wing metastasis with extraosseous extension of tumor has substantially increased since 07/07/18 (45 mm now versus 31 mm previously). Electronically Signed   By: Genevie Ann M.D.   On: 07/20/2018 21:16   Mr Lumbar Spine W Wo Contrast  Result Date: 07/07/2018 CLINICAL DATA:  History of prostate cancer and bladder cancer. Back pain EXAM: MRI LUMBAR SPINE WITHOUT AND WITH CONTRAST TECHNIQUE: Multiplanar and multiecho pulse sequences of the lumbar spine were obtained without and with intravenous contrast. CONTRAST:  8 mL Gadovist IV COMPARISON:  CT abdomen pelvis 07/05/2018 FINDINGS: Segmentation:  Normal Alignment:  Normal  Vertebrae: Multiple enhancing bone marrow lesions are seen compatible with metastatic disease throughout the lumbar spine as well as in the lower thoracic spine and the sacrum. Metastatic deposits are seen throughout the vertebral bodies from T11 through S1. 3 cm mass in the left iliac bone. No epidural tumor. Conus medullaris and cauda equina: Conus extends to the L1-2 level. Conus and cauda equina appear normal. Paraspinal and other soft tissues: Bilateral renal cysts. No retroperitoneal mass or adenopathy. Disc levels: L1-2: Negative L2-3: Mild disc bulging L3-4: Prior laminectomy on the left. Small left paracentral disc protrusion with mild subarticular stenosis on the left L4-5: Prior laminectomy on the left. Broad-based central disc protrusion with associated endplate spurring. Mild subarticular stenosis bilaterally L5-S1: Moderate disc degeneration and spurring. Subarticular stenosis bilaterally due to spurring. Right  laminectomy. IMPRESSION: Widespread metastatic disease throughout the lower thoracic spine, tire lumbar spine, and sacrum. 3 cm expansile mass left iliac bone. No pathologic fracture Lower lumbar degenerative change as above. Prior laminectomy at L3-4, L4-5, L5-S1. Electronically Signed   By: Franchot Gallo M.D.   On: 07/07/2018 14:45   Ct Abdomen Pelvis W Contrast  Result Date: 07/20/2018 CLINICAL DATA:  Back pain. EXAM: CT ABDOMEN AND PELVIS WITH CONTRAST TECHNIQUE: Multidetector CT imaging of the abdomen and pelvis was performed using the standard protocol following bolus administration of intravenous contrast. CONTRAST:  176mL OMNIPAQUE IOHEXOL 300 MG/ML  SOLN COMPARISON:  CT scan of July 05, 2018.  MRI of July 07, 2018. FINDINGS: Lower chest: Mild bilateral posterior basilar subsegmental atelectasis is noted. Hepatobiliary: No gallstones or biliary dilatation is noted. Multiple small rounded hypoechoic lesions are seen throughout hepatic parenchyma concerning for metastatic disease. The largest measures 13 mm. Pancreas: Unremarkable. No pancreatic ductal dilatation or surrounding inflammatory changes. Spleen: Normal in size without focal abnormality. Adrenals/Urinary Tract: Adrenal glands appear normal. Bilateral renal cysts are noted. No hydronephrosis or renal obstruction is noted. No renal or ureteral calculi are noted. The bladder is very irregular in contour which may be postsurgical in etiology. Stomach/Bowel: The stomach appears normal. There is no evidence of bowel obstruction or inflammation. Stool is noted throughout the colon. The appendix appears normal. Vascular/Lymphatic: No significant vascular findings are present. No enlarged abdominal or pelvic lymph nodes. Reproductive: No definite mass is noted. Other: No abdominal wall hernia or abnormality. No abdominopelvic ascites. Musculoskeletal: Multiple lytic metastatic lesions are noted in the visualized left ribs, spine and pelvis. The largest  lesion is noted in the T8 vertebral body and measures 2.9 cm. It extends slightly posteriorly into the posterior spinal canal. IMPRESSION: Multiple small hepatic metastatic lesions are noted. Multiple lytic metastatic lesions are noted in the visualized ribs, spine and pelvis. The bladder is irregular in contour which most likely is postsurgical in etiology. Stool is noted throughout the colon. Electronically Signed   By: Marijo Conception M.D.   On: 07/20/2018 16:40   Dg Chest Port 1 View  Result Date: 07/20/2018 CLINICAL DATA:  Chest pain.  Metastatic bladder and prostate cancer. EXAM: PORTABLE CHEST 1 VIEW COMPARISON:  Chest x-ray dated November 12, 2017. FINDINGS: The heart size and mediastinal contours are within normal limits. Normal pulmonary vascularity. Low lung volumes with mild right basilar atelectasis. No acute osseous abnormality. No focal consolidation, pleural effusion, or pneumothorax. IMPRESSION: Mild right basilar atelectasis.  No active disease. Electronically Signed   By: Titus Dubin M.D.   On: 07/20/2018 11:31   Ct Renal Stone Study  Result Date: 07/05/2018 CLINICAL DATA:  History of prostate cancer, bladder cancer, postop. Flank pain. EXAM: CT ABDOMEN AND PELVIS WITHOUT CONTRAST TECHNIQUE: Multidetector CT imaging of the abdomen and pelvis was performed following the standard protocol without IV contrast. COMPARISON:  PET CT 09/25/2017.  CT abdomen and pelvis 06/18/2017. FINDINGS: Lower chest: Mild wall thickening in the lingula with airspace opacity, likely atelectasis. No effusions. Heart is normal size. Hepatobiliary: No focal hepatic abnormality. Gallbladder unremarkable. Pancreas: No focal abnormality or ductal dilatation. Spleen: No focal abnormality.  Normal size. Adrenals/Urinary Tract: Multiple bilateral renal cysts. No hydronephrosis. No renal or ureteral stones. Adrenal glands unremarkable. Foley catheter present in the neobladder which is grossly unremarkable.  Stomach/Bowel: Postoperative changes in the right lower quadrant. Moderate stool burden in the colon. No evidence of a bowel obstruction. Vascular/Lymphatic: Aortic atherosclerosis. No enlarged abdominal or pelvic lymph nodes. Reproductive: No visible focal abnormality. Other: No free fluid or free air. Musculoskeletal: Postoperative changes in the right pelvis. No acute bony abnormality. IMPRESSION: Foley catheter in decompressed neobladder.  No hydronephrosis. Bilateral renal cysts are stable since prior study. Moderate stool in the colon. Aortic atherosclerosis. No acute findings. Electronically Signed   By: Rolm Baptise M.D.   On: 07/05/2018 00:49    Labs:  CBC: Recent Labs    11/05/17 1012  11/10/17 0511 07/04/18 2324 07/20/18 1053 07/21/18 0743  WBC 6.2  --   --  12.0* 12.2* 12.2*  HGB 14.2   < > 10.6* 12.3* 13.1 13.1  HCT 42.1   < > 32.9* 38.0* 41.5 41.5  PLT 247  --   --  354 388 384   < > = values in this interval not displayed.    COAGS: Recent Labs    07/20/18 1053  INR 1.1    BMP: Recent Labs    11/14/17 0534 07/04/18 2324 07/20/18 1053 07/21/18 0743  NA 135 138 135 135  K 4.1 4.5 4.3 5.2*  CL 109 102 96* 96*  CO2 23 27 26 28   GLUCOSE 94 112* 129* 97  BUN 14 24* 25* 23*  CALCIUM 8.8* 10.0 10.4* 10.7*  CREATININE 0.93 0.92 0.93 1.05  GFRNONAA >60 >60 >60 >60  GFRAA >60 >60 >60 >60    LIVER FUNCTION TESTS: Recent Labs    07/04/18 2324 07/20/18 1527  BILITOT 0.2* 0.6  AST 15 14*  ALT 15 19  ALKPHOS 124 129*  PROT 7.5 6.9  ALBUMIN 3.6 2.8*    TUMOR MARKERS: No results for input(s): AFPTM, CEA, CA199, CHROMGRNA in the last 8760 hours.  Assessment and Plan:  Urothelial cancer Bony mets Was scheduled as OP today for Left iliac bone biopsy Now is Inpt-- admitted for pain control For biopsy today Risks and benefits of left iliac bone lesion bx was discussed with the patient and/or patient's family including, but not limited to bleeding,  infection, damage to adjacent structures or low yield requiring additional tests.  All of the questions were answered and there is agreement to proceed. Consent signed and in chart.  Thank you for this interesting consult.  I greatly enjoyed meeting SILVIANO NEUSER and look forward to participating in their care.  A copy of this report was sent to the requesting provider on this date.  Electronically Signed: Lavonia Drafts, PA-C 07/22/2018, 8:04 AM   I spent a total of 40 Minutes    in face to face in clinical consultation, greater than 50% of which was counseling/coordinating care for left iliac bone lesion bx

## 2018-07-22 NOTE — Progress Notes (Signed)
PROGRESS NOTE    Rodney [name]  GPQ:[id number] DOB: [birth year] DOA: 07/20/2018 PCP: Curlene Labrum, MD    Brief Narrative:48 y.o. male with medical history significant of bladder tumor, prostate cancer s/p robotic assisted cystoprostatectomy, anxiety, and depression; who presents with complaints of severe pain in his back over the last week.  Complains of having pain in the right shoulder moved to the left side and now goes down  his back wrapping around his chest.  He denies any recent falls or sustaining any trauma.  He reports utilizing his home pain medications, steroids, and muscle relaxants without relief of symptoms.  He has been unable to lift his legs due to symptoms.  Patient reports that he was supposed to have a biopsy of the left iliac crest lesion on 7/7.  Associated symptoms include constipation as patient reports last bowel movement was approximately 6 days ago.  Denies having any fever, shortness of breath, cough, or abdominal pain.  ED Course: Upon admission into the emergency department patient was noted to be afebrile, pulse up to 120, respirations up to 28, and O2 saturation relatively maintained on room air.  Labs revealed WBC 12.2.  Urinalysis positive for moderate leukocytes, many bacteria, and greater than 50 WBCs.  COVID-19 testing was negative.  Patient was initially seen at [facility] and transferred here to [facility] for need of MRI.  MRI revealed diffuse progression of bony mets throughout the spine and a large medial left iliac wing metastases with extraosseous extension of tumor increased from previous study done on 6/21.  Dr. Maylon Peppers of oncology recommended patient admission for pain control.  Patient may need palliative radiation but no need to transport to [facility] at this time.  Patient was given Rocephin IV, dexamethasone 4 mg, fentanyl IV, and Dilaudid IV.  Assessment & Plan:   Principal Problem:   Intractable pain Active Problems:   Malignant neoplasm of  prostate (HCC)   Spine metastasis (HCC)   Constipation   Acute lower UTI   Leukocytosis  #1 metastatic prostate cancer-patient admitted for pain control. Palliative care following for pain control.?  If he might need palliative radiation to spine.  MRI of the spine  Widespread spinal and partially visible rib and pelvic metastases. Epidural and/or neural foraminal extension of tumor at the C3, T4, T6, T8, L1, L3, and L5. But no malignant spinal stenosis or cord compression. Multiple metastases throughout the spine are slightly larger since the MRIs last month, but there is no pathologic vertebral fracture.. No intradural metastasis.  A large medial left iliac wing metastasis with extraosseous extension of tumor has substantially increased since 07/07/18 (45 mm now versus 31 mm previously).  With widespread meta stasis as above patient in excruciating pain and needs ongoing IV pain management and palliative care input prior to discharge.  He failed outpatient treatment with oxycodone OxyContin Flexeril and Percocet.  On fentanyl 100 mcg q2 prn,duragesic patch 75 mcg,decadron for pain control patient still in pain  DW dr moody who will look into xrt patient was getting xrt at the [place] [facility].  #2 constipation -had BMS yesterday continue stool softners and laxatives   #3 patient met Sirs criteria upon admission with tachycardia tachypnea and leukocytosis.  UA appears dirty with leukocytes and many bacteria started on Rocephin continue and follow-up urine culture.  #4 hyperkalemia with increase in creatinine improved with fluids   #5 hypercalcemia increase since admission from 10.4-10.8 possibly due to decreased p.o. intake and dehydration we  will see if this improves with IV fluids.  DVT prophylaxis: lovenox Code Status: full Family Communication: dw daughter. Disposition Plan: TBD Consults called: oncology palliative care  Estimated body mass index is 23.62 kg/m as calculated  from the following:   Height as of this encounter: _0  (1.88 m).   Weight as of this encounter: 83.5 kg.    Subjective: somewhat better than yesterday but still with lots of discomfort and pain Objective: Vitals:   07/22/18 1240 07/22/18 1255 07/22/18 1309 07/22/18 1603  BP: 109/78 110/77 131/89 (!) 132/99  Pulse: (!) 107 (!) 112 (!) 108 (!) 105  Resp: _1 Temp:   99.1 F (37.3 C) 97.6 F (36.4 C)  TempSrc:   Oral Oral  SpO2: 93% 92% 92% 91%  Weight:      Height:        Intake/Output Summary (Last 24 hours) at 07/22/2018 1614 Last data filed at 07/22/2018 1500 Gross per 24 hour  Intake 2260 ml  Output 2200 ml  Net 60 ml   Filed Weights   07/20/18 1019  Weight: 83.5 kg    Examination: Oral mucosa dry  General exam: Appears restless uncomfortable in distress due to excruciating back pain  Respiratory system: Clear to auscultation. Respiratory effort normal. Cardiovascular system: S1 & S2 heard, RRR. No JVD, murmurs, rubs, gallops or clicks. No pedal edema. Gastrointestinal system: Abdomen is distended, soft and tender. No organomegaly or masses felt. Normal bowel sounds heard. Central nervous system: Alert and oriented. No focal neurological deficits. Extremities: Symmetric 5 x 5 power. Skin: No rashes, lesions or ulcers Psychiatry: Judgement and insight appear normal. Mood & affect appropriate.     Data Reviewed: I have personally reviewed following labs and imaging studies  CBC: Recent Labs  Lab 07/20/18 1053 07/21/18 0743  WBC 12.2* 12.2*  NEUTROABS 10.1*  --   HGB 13.1 13.1  HCT 41.5 41.5  MCV 100.2* 99.3  PLT 388 378   Basic Metabolic Panel: Recent Labs  Lab 07/20/18 1053 07/21/18 0743 07/22/18 0712  NA 135 135 134*  K 4.3 5.2* 4.8  CL 96* 96* 99  CO2 _2 GLUCOSE 129* 97 117*  BUN 25* 23* 23*  CREATININE 0.93 1.05 0.78  CALCIUM 10.4* 10.7* 10.2   GFR: Estimated Creatinine Clearance: 128.4 mL/min (by C-G formula based on SCr  of 0.78 mg/dL). Liver Function Tests: Recent Labs  Lab 07/20/18 1527 07/22/18 0712  AST 14* 17  ALT 19 16  ALKPHOS 129* 110  BILITOT 0.6 0.6  PROT 6.9 6.7  ALBUMIN 2.8* 2.4*   Recent Labs  Lab 07/20/18 1527  LIPASE 20   No results for input(s): AMMONIA in the last 168 hours. Coagulation Profile: Recent Labs  Lab 07/20/18 1053  INR 1.1   Cardiac Enzymes: No results for input(s): CKTOTAL, CKMB, CKMBINDEX, TROPONINI in the last 168 hours. BNP (last 3 results) No results for input(s): PROBNP in the last 8760 hours. HbA1C: No results for input(s): HGBA1C in the last 72 hours. CBG: No results for input(s): GLUCAP in the last 168 hours. Lipid Profile: No results for input(s): CHOL, HDL, LDLCALC, TRIG, CHOLHDL, LDLDIRECT in the last 72 hours. Thyroid Function Tests: No results for input(s): TSH, T4TOTAL, FREET4, T3FREE, THYROIDAB in the last 72 hours. Anemia Panel: No results for input(s): VITAMINB12, FOLATE, FERRITIN, TIBC, IRON, RETICCTPCT in the last 72 hours. Sepsis Labs: Recent Labs  Lab 07/21/18 0039  LATICACIDVEN 1.0    Recent  Results (from the past 240 hour(s))  SARS Coronavirus 2 (CEPHEID - Performed in [place] hospital lab), Hosp Order     Status: None   Collection Time: 07/20/18 11:06 AM   Specimen: Nasopharyngeal Swab  Result Value Ref Range Status   SARS Coronavirus 2 NEGATIVE NEGATIVE Final    Comment: (NOTE) If result is NEGATIVE SARS-CoV-2 target nucleic acids are NOT DETECTED. The SARS-CoV-2 RNA is generally detectable in upper and lower  respiratory specimens during the acute phase of infection. The lowest  concentration of SARS-CoV-2 viral copies this assay can detect is 250  copies / mL. A negative result does not preclude SARS-CoV-2 infection  and should not be used as the sole basis for treatment or other  patient management decisions.  A negative result may occur with  improper specimen collection / handling, submission of specimen other   than nasopharyngeal swab, presence of viral mutation(s) within the  areas targeted by this assay, and inadequate number of viral copies  (<250 copies / mL). A negative result must be combined with clinical  observations, patient history, and epidemiological information. If result is POSITIVE SARS-CoV-2 target nucleic acids are DETECTED. The SARS-CoV-2 RNA is generally detectable in upper and lower  respiratory specimens dur ing the acute phase of infection.  Positive  results are indicative of active infection with SARS-CoV-2.  Clinical  correlation with patient history and other diagnostic information is  necessary to determine patient infection status.  Positive results do  not rule out bacterial infection or co-infection with other viruses. If result is PRESUMPTIVE POSTIVE SARS-CoV-2 nucleic acids MAY BE PRESENT.   A presumptive positive result was obtained on the submitted specimen  and confirmed on repeat testing.  While 2019 novel coronavirus  (SARS-CoV-2) nucleic acids may be present in the submitted sample  additional confirmatory testing may be necessary for epidemiological  and / or clinical management purposes  to differentiate between  SARS-CoV-2 and other Sarbecovirus currently known to infect humans.  If clinically indicated additional testing with an alternate test  methodology [phone]) is advised. The SARS-CoV-2 RNA is generally  detectable in upper and lower respiratory sp ecimens during the acute  phase of infection. The expected result is Negative. Fact Sheet for Patients:  StrictlyIdeas.no Fact Sheet for Healthcare Providers: BankingDealers.co.za This test is not yet approved or cleared by the [place] FDA and has been authorized for detection and/or diagnosis of SARS-CoV-2 by FDA under an Emergency Use Authorization (EUA).  This EUA will remain in effect (meaning this test can be used) for the duration of  the COVID-19 declaration under Section 564(b)(1) of the Act, 21 U.S.C. section 360bbb-3(b)(1), unless the authorization is terminated or revoked sooner. Performed at [facility], [address], [place], [place] [address]          Radiology Studies: Mr Cervical Spine W Or Wo Contrast  Result Date: 07/20/2018 CLINICAL DATA:  48 year old male with metastatic prostate cancer. EXAM: MRI TOTAL SPINE WITHOUT AND WITH CONTRAST TECHNIQUE: Multisequence MR imaging of the spine from the cervical spine to the sacrum was performed prior to and following IV contrast administration for evaluation of spinal metastatic disease. CONTRAST:  8 milliliters Gadavist COMPARISON:  Cervical and thoracic MRI 07/16/2018. Lumbar MRI 07/07/2018. FINDINGS: MRI CERVICAL SPINE FINDINGS Alignment: Stable cervical lordosis. Vertebrae: C3 vertebral body metastasis redemonstrated and stable in size, approximately 13 millimeters diameter. No epidural or extraosseous extension. There is a subtle C2 inferior endplate metastasis (series 6, image 7) with no  extraosseous extension, increased. No other cervical spine metastasis; mild degenerative marrow edema suspected in the right C3 inferior articulating facet is also stable. No metastasis at the visible skull base. Cord: Spinal cord signal is within normal limits at all visualized levels. No abnormal intradural enhancement. No dural thickening. Posterior Fossa, vertebral arteries, paraspinal tissues: Cervicomedullary junction is within normal limits. Negative visible posterior fossa. Preserved major vascular flow voids in the neck. The right vertebral artery appears mildly dominant. Disc levels: Stable degenerative changes with spinal stenosis at C3-C4, C5-C6 and C6-C7 again noted. Degenerative cervical neural foraminal stenosis as detailed last month. MRI THORACIC SPINE FINDINGS Segmentation:  Normal. Alignment:  Stable thoracic kyphosis. Vertebrae: Widespread thoracic metastases. A small  T2 metastasis is redemonstrated in the midline lamina, and demonstrates early dorsal epidural space extension on series 7, image 7 which appears increased. Posterior vertebral body T4 metastasis with epidural extension to the right of midline measures about 19 millimeters diameter and has not significantly changed. There is no associated spinal stenosis, although there is early epidural involvement of the right T4 neural foramen on series 30, image 6, stable. Metastasis in the T5 spinous process and enlarging the right costovertebral junction is stable (the latter up to 22 millimeters). There is early extension into the right T5 neural foramen but no associated neural impingement. T6 left inferior pedicle metastasis and posterior left 6th rib metastasis redemonstrated, the former with epidural extension into the left T6 neural foramen on series 29, image 13, stable. Left T7 vertebral body metastasis is stable without extraosseous extension. Bulky left side metastasis with ventral epidural and left T8 neural foraminal extension is stable measuring about 31 millimeters diameter. There is associated left foraminal stenosis, stable. Possible small T9 posterior inferior endplate metastasis is stable, and there are expansile bilateral posterior 9th rib metastases located near the costovertebral junctions (series 32, image 22). The larger is 24 millimeters. The T10 level appears spared. Superior endplate metastases at B63 and T12 are redemonstrated without extraosseous extension. There is a left 12 costovertebral junction metastasis, stable (series 33, image 33). Cord: Capacious spinal canal. Spinal cord signal is within normal limits at all visualized levels. No abnormal intradural enhancement. No dural thickening. Paraspinal and other soft tissues: Small pleural effusions. Disc levels: No thoracic spinal stenosis despite ventral epidural tumor at T4 and T8. There is stable neural foraminal impingement at the left T6 and  T8 levels. MRI LUMBAR SPINE FINDINGS Segmentation:  Normal. Alignment:  Stable mild straightening of lumbar lordosis. Vertebrae: Multilevel lumbar and partially visible sac, T2, ral and pelvic metastases. L1 posterosuperior endplate, right side vertebral body, and right inferior articulating facet metastases re-demonstrated. There is early ventral epidural extension of tumor with the former (series 35, image 10) which has increased since 07/07/2018. The facet metastasis has also increased. Small L2 superior endplate metastasis is stable without extraosseous extension. Three L3 vertebral body metastases, with mildly enlarged involvement at the anterior superior endplate since 84/53/6468, and early extension into the inferior left L3 neural foramen which is stable (series 38, image 16). Previous left L4 laminectomy. A small right L4 vertebral metastasis is minimally increased on series 37, image 5. A midline posterosuperior endplate L5 metastasis with early ventral epidural extension also appears slightly larger on series 38, image 23. At that same level a bulky metastasis in the left iliac wing with extraosseous extension has increased from 31-45 millimeters over the same time. 24 millimeter left ventral S1 metastasis with early extraosseous extension into the presacral space  has also mildly increased. There is no sacral metastasis related neural impingement identified. Conus medullaris: Extends to the T12-L1 level and appears normal. No lower spinal cord or conus signal abnormality. Normal cauda equina nerve roots. No abnormal intradural enhancement or dural thickening. Paraspinal and other soft tissues: Stable visualized abdominal viscera. Disc levels: No lumbar spinal stenosis or malignant neural impingement despite early epidural and foraminal involvement at several levels. IMPRESSION: 1. 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 Epidural and/or neural foraminal extension of tumor at the  C3, T4, T6, T8, L1, L3, and L5. But no malignant spinal stenosis or cord compression. Multiple metastases throughout the spine are slightly larger since the MRIs last month, but there is no pathologic vertebral fracture. 2. No intradural metastasis. 3. A large medial left iliac wing metastasis with extraosseous extension of tumor has substantially increased since 07/07/18 (45 mm now versus 31 mm previously). Electronically Signed   By: Genevie Ann M.D.   On: 07/20/2018 21:16   Mr Thoracic Spine W Wo Contrast  Result Date: 07/20/2018 CLINICAL DATA:  48 year old male with metastatic prostate cancer. EXAM: MRI TOTAL SPINE WITHOUT AND WITH CONTRAST TECHNIQUE: Multisequence MR imaging of the spine from the cervical spine to the sacrum was performed prior to and following IV contrast administration for evaluation of spinal metastatic disease. CONTRAST:  8 milliliters Gadavist COMPARISON:  Cervical and thoracic MRI 07/16/2018. Lumbar MRI 07/07/2018. FINDINGS: MRI CERVICAL SPINE FINDINGS Alignment: Stable cervical lordosis. Vertebrae: C3 vertebral body metastasis redemonstrated and stable in size, approximately 13 millimeters diameter. No epidural or extraosseous extension. There is a subtle C2 inferior endplate metastasis (series 6, image 7) with no extraosseous extension, increased. No other cervical spine metastasis; mild degenerative marrow edema suspected in the right C3 inferior articulating facet is also stable. No metastasis at the visible skull base. Cord: Spinal cord signal is within normal limits at all visualized levels. No abnormal intradural enhancement. No dural thickening. Posterior Fossa, vertebral arteries, paraspinal tissues: Cervicomedullary junction is within normal limits. Negative visible posterior fossa. Preserved major vascular flow voids in the neck. The right vertebral artery appears mildly dominant. Disc levels: Stable degenerative changes with spinal stenosis at C3-C4, C5-C6 and C6-C7 again  noted. Degenerative cervical neural foraminal stenosis as detailed last month. MRI THORACIC SPINE FINDINGS Segmentation:  Normal. Alignment:  Stable thoracic kyphosis. Vertebrae: Widespread thoracic metastases. A small T2 metastasis is redemonstrated in the midline lamina, and demonstrates early dorsal epidural space extension on series 7, image 7 which appears increased. Posterior vertebral body T4 metastasis with epidural extension to the right of midline measures about 19 millimeters diameter and has not significantly changed. There is no associated spinal stenosis, although there is early epidural involvement of the right T4 neural foramen on series 30, image 6, stable. Metastasis in the T5 spinous process and enlarging the right costovertebral junction is stable (the latter up to 22 millimeters). There is early extension into the right T5 neural foramen but no associated neural impingement. T6 left inferior pedicle metastasis and posterior left 6th rib metastasis redemonstrated, the former with epidural extension into the left T6 neural foramen on series 29, image 13, stable. Left T7 vertebral body metastasis is stable without extraosseous extension. Bulky left side metastasis with ventral epidural and left T8 neural foraminal extension is stable measuring about 31 millimeters diameter. There is associated left foraminal stenosis, stable. Possible small T9 posterior inferior endplate metastasis is stable, and there are expansile bilateral posterior 9th rib metastases located near  the costovertebral junctions (series 32, image 22). The larger is 24 millimeters. The T10 level appears spared. Superior endplate metastases at W10 and T12 are redemonstrated without extraosseous extension. There is a left 12 costovertebral junction metastasis, stable (series 33, image 33). Cord: Capacious spinal canal. Spinal cord signal is within normal limits at all visualized levels. No abnormal intradural enhancement. No dural  thickening. Paraspinal and other soft tissues: Small pleural effusions. Disc levels: No thoracic spinal stenosis despite ventral epidural tumor at T4 and T8. There is stable neural foraminal impingement at the left T6 and T8 levels. MRI LUMBAR SPINE FINDINGS Segmentation:  Normal. Alignment:  Stable mild straightening of lumbar lordosis. Vertebrae: Multilevel lumbar and partially visible sac, T2, ral and pelvic metastases. L1 posterosuperior endplate, right side vertebral body, and right inferior articulating facet metastases re-demonstrated. There is early ventral epidural extension of tumor with the former (series 35, image 10) which has increased since 07/07/2018. The facet metastasis has also increased. Small L2 superior endplate metastasis is stable without extraosseous extension. Three L3 vertebral body metastases, with mildly enlarged involvement at the anterior superior endplate since 27/25/3664, and early extension into the inferior left L3 neural foramen which is stable (series 38, image 16). Previous left L4 laminectomy. A small right L4 vertebral metastasis is minimally increased on series 37, image 5. A midline posterosuperior endplate L5 metastasis with early ventral epidural extension also appears slightly larger on series 38, image 23. At that same level a bulky metastasis in the left iliac wing with extraosseous extension has increased from 31-45 millimeters over the same time. 24 millimeter left ventral S1 metastasis with early extraosseous extension into the presacral space has also mildly increased. There is no sacral metastasis related neural impingement identified. Conus medullaris: Extends to the T12-L1 level and appears normal. No lower spinal cord or conus signal abnormality. Normal cauda equina nerve roots. No abnormal intradural enhancement or dural thickening. Paraspinal and other soft tissues: Stable visualized abdominal viscera. Disc levels: No lumbar spinal stenosis or malignant neural  impingement despite early epidural and foraminal involvement at several levels. IMPRESSION: 1. 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 Epidural and/or neural foraminal extension of tumor at the C3, T4, T6, T8, L1, L3, and L5. But no malignant spinal stenosis or cord compression. Multiple metastases throughout the spine are slightly larger since the MRIs last month, but there is no pathologic vertebral fracture. 2. No intradural metastasis. 3. A large medial left iliac wing metastasis with extraosseous extension of tumor has substantially increased since 07/07/18 (45 mm now versus 31 mm previously). Electronically Signed   By: Genevie Ann M.D.   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 EXAM: MRI TOTAL SPINE WITHOUT AND WITH CONTRAST TECHNIQUE: Multisequence MR imaging of the spine from the cervical spine to the sacrum was performed prior to and following IV contrast administration for evaluation of spinal metastatic disease. CONTRAST:  8 milliliters Gadavist COMPARISON:  Cervical and thoracic MRI 07/16/2018. Lumbar MRI 07/07/2018. FINDINGS: MRI CERVICAL SPINE FINDINGS Alignment: Stable cervical lordosis. Vertebrae: C3 vertebral body metastasis redemonstrated and stable in size, approximately 13 millimeters diameter. No epidural or extraosseous extension. There is a subtle C2 inferior endplate metastasis (series 6, image 7) with no extraosseous extension, increased. No other cervical spine metastasis; mild degenerative marrow edema suspected in the right C3 inferior articulating facet is also stable. No metastasis at the visible  skull base. Cord: Spinal cord signal is within normal limits at all visualized levels. No abnormal intradural enhancement. No dural thickening. Posterior Fossa, vertebral arteries, paraspinal tissues: Cervicomedullary junction is within normal limits. Negative  visible posterior fossa. Preserved major vascular flow voids in the neck. The right vertebral artery appears mildly dominant. Disc levels: Stable degenerative changes with spinal stenosis at C3-C4, C5-C6 and C6-C7 again noted. Degenerative cervical neural foraminal stenosis as detailed last month. MRI THORACIC SPINE FINDINGS Segmentation:  Normal. Alignment:  Stable thoracic kyphosis. Vertebrae: Widespread thoracic metastases. A small T2 metastasis is redemonstrated in the midline lamina, and demonstrates early dorsal epidural space extension on series 7, image 7 which appears increased. Posterior vertebral body T4 metastasis with epidural extension to the right of midline measures about 19 millimeters diameter and has not significantly changed. There is no associated spinal stenosis, although there is early epidural involvement of the right T4 neural foramen on series 30, image 6, stable. Metastasis in the T5 spinous process and enlarging the right costovertebral junction is stable (the latter up to 22 millimeters). There is early extension into the right T5 neural foramen but no associated neural impingement. T6 left inferior pedicle metastasis and posterior left 6th rib metastasis redemonstrated, the former with epidural extension into the left T6 neural foramen on series 29, image 13, stable. Left T7 vertebral body metastasis is stable without extraosseous extension. Bulky left side metastasis with ventral epidural and left T8 neural foraminal extension is stable measuring about 31 millimeters diameter. There is associated left foraminal stenosis, stable. Possible small T9 posterior inferior endplate metastasis is stable, and there are expansile bilateral posterior 9th rib metastases located near the costovertebral junctions (series 32, image 22). The larger is 24 millimeters. The T10 level appears spared. Superior endplate metastases at B01 and T12 are redemonstrated without extraosseous extension. There is a  left 12 costovertebral junction metastasis, stable (series 33, image 33). Cord: Capacious spinal canal. Spinal cord signal is within normal limits at all visualized levels. No abnormal intradural enhancement. No dural thickening. Paraspinal and other soft tissues: Small pleural effusions. Disc levels: No thoracic spinal stenosis despite ventral epidural tumor at T4 and T8. There is stable neural foraminal impingement at the left T6 and T8 levels. MRI LUMBAR SPINE FINDINGS Segmentation:  Normal. Alignment:  Stable mild straightening of lumbar lordosis. Vertebrae: Multilevel lumbar and partially visible sac, T2, ral and pelvic metastases. L1 posterosuperior endplate, right side vertebral body, and right inferior articulating facet metastases re-demonstrated. There is early ventral epidural extension of tumor with the former (series 35, image 10) which has increased since 07/07/2018. The facet metastasis has also increased. Small L2 superior endplate metastasis is stable without extraosseous extension. Three L3 vertebral body metastases, with mildly enlarged involvement at the anterior superior endplate since 75/10/2583, and early extension into the inferior left L3 neural foramen which is stable (series 38, image 16). Previous left L4 laminectomy. A small right L4 vertebral metastasis is minimally increased on series 37, image 5. A midline posterosuperior endplate L5 metastasis with early ventral epidural extension also appears slightly larger on series 38, image 23. At that same level a bulky metastasis in the left iliac wing with extraosseous extension has increased from 31-45 millimeters over the same time. 24 millimeter left ventral S1 metastasis with early extraosseous extension into the presacral space has also mildly increased. There is no sacral metastasis related neural impingement identified. Conus medullaris: Extends to the T12-L1 level and appears normal. No lower spinal cord or  conus signal abnormality.  Normal cauda equina nerve roots. No abnormal intradural enhancement or dural thickening. Paraspinal and other soft tissues: Stable visualized abdominal viscera. Disc levels: No lumbar spinal stenosis or malignant neural impingement despite early epidural and foraminal involvement at several levels. IMPRESSION: 1. 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 Epidural and/or neural foraminal extension of tumor at the C3, T4, T6, T8, L1, L3, and L5. But no malignant spinal stenosis or cord compression. Multiple metastases throughout the spine are slightly larger since the MRIs last month, but there is no pathologic vertebral fracture. 2. No intradural metastasis. 3. A large medial left iliac wing metastasis with extraosseous extension of tumor has substantially increased since 07/07/18 (45 mm now versus 31 mm previously). Electronically Signed   By: Genevie Ann M.D.   On: 07/20/2018 21:16   Ct Abdomen Pelvis W Contrast  Result Date: 07/20/2018 CLINICAL DATA:  Back pain. EXAM: CT ABDOMEN AND PELVIS WITH CONTRAST TECHNIQUE: Multidetector CT imaging of the abdomen and pelvis was performed using the standard protocol following bolus administration of intravenous contrast. CONTRAST:  147m OMNIPAQUE IOHEXOL 300 MG/ML  SOLN COMPARISON:  CT scan of July 05, 2018.  MRI of July 07, 2018. FINDINGS: Lower chest: Mild bilateral posterior basilar subsegmental atelectasis is noted. Hepatobiliary: No gallstones or biliary dilatation is noted. Multiple small rounded hypoechoic lesions are seen throughout hepatic parenchyma concerning for metastatic disease. The largest measures 13 mm. Pancreas: Unremarkable. No pancreatic ductal dilatation or surrounding inflammatory changes. Spleen: Normal in size without focal abnormality. Adrenals/Urinary Tract: Adrenal glands appear normal. Bilateral renal cysts are noted. No hydronephrosis or renal obstruction is noted. No renal or ureteral calculi are noted. The bladder is  very irregular in contour which may be postsurgical in etiology. Stomach/Bowel: The stomach appears normal. There is no evidence of bowel obstruction or inflammation. Stool is noted throughout the colon. The appendix appears normal. Vascular/Lymphatic: No significant vascular findings are present. No enlarged abdominal or pelvic lymph nodes. Reproductive: No definite mass is noted. Other: No abdominal wall hernia or abnormality. No abdominopelvic ascites. Musculoskeletal: Multiple lytic metastatic lesions are noted in the visualized left ribs, spine and pelvis. The largest lesion is noted in the T8 vertebral body and measures 2.9 cm. It extends slightly posteriorly into the posterior spinal canal. IMPRESSION: Multiple small hepatic metastatic lesions are noted. Multiple lytic metastatic lesions are noted in the visualized ribs, spine and pelvis. The bladder is irregular in contour which most likely is postsurgical in etiology. Stool is noted throughout the colon. Electronically Signed   By: JMarijo ConceptionM.D.   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 Patient has bone and liver lesions. Patient has a destructive lytic lesion involving the left iliac bone. EXAM: CT-GUIDED BIOPSY OF LEFT ILIAC BONE LESION MEDICATIONS: None. ANESTHESIA/SEDATION: Moderate (conscious) sedation was employed during this procedure. A total of Versed 2.0 mg and Fentanyl 100 mcg was administered intravenously. Moderate Sedation Time: 15 minutes. The patient's level of consciousness and vital signs were monitored continuously by radiology nursing throughout the procedure under my direct supervision. FLUOROSCOPY TIME:  None COMPLICATIONS: None immediate. PROCEDURE: Informed written consent was obtained from the patient after a thorough discussion of the procedural risks, benefits and alternatives. All questions were addressed. A timeout was performed prior  to the initiation of the procedure. Patient was placed on his right side. CT images through the pelvis were obtained. The destructive  lesion involving the left iliac bone was identified. Left side of the pelvis was prepped with chlorhexidine and sterile field was created. Skin and soft tissues were anesthetized with 1% lidocaine. Using CT guidance, 17 gauge coaxial needle was directed along the posterior aspect of the lesion. Total of 4 core biopsies were obtained with an 18 gauge device. Specimens placed in formalin. Needle was removed without complication. Bandage placed over the puncture site. FINDINGS: Again noted is large lytic lesion involving the left ilium with destruction of bone. Core biopsies were obtained from the soft tissue component of the lesion. IMPRESSION: CT-guided core biopsies of the left iliac bone lesion. Electronically Signed   By: Markus Daft M.D.   On: 07/22/2018 14:31        Scheduled Meds:  bisacodyl  10 mg Oral Daily   dexamethasone (DECADRON) injection  4 mg Intravenous BID   [START ON 07/23/2018] enoxaparin (LOVENOX) injection  40 mg Subcutaneous Daily   fentaNYL  1 patch Transdermal Q72H   fentaNYL       lidocaine       midazolam       polyethylene glycol  17 g Oral Daily   senna-docusate  2 tablet Oral BID   sodium chloride flush  3 mL Intravenous Q12H   Continuous Infusions:  sodium chloride 100 mL/hr at 07/21/18 2208   cefTRIAXone (ROCEPHIN)  IV 1 g (07/21/18 2204)     LOS: 1 day     Georgette Shell, MD [facility]  If 7PM-7AM, please contact night-coverage [email or web address] Password [email or web address] 07/22/2018, 4:14 PM

## 2018-07-22 NOTE — Procedures (Signed)
Interventional Radiology Procedure:   Indications: Metastatic urothelial cancer with bone lesions  Procedure: CT guided biopsy of left iliac bone lesion  Findings: Lytic lesion in left iliac bone, 4 cores obtained.  Complications: None     EBL: less than 20 ml  Plan: Bedrest 1 hour.    Hillman Attig R. Anselm Pancoast, MD  Pager: [phone]

## 2018-07-23 ENCOUNTER — Ambulatory Visit
Admit: 2018-07-23 | Discharge: 2018-07-23 | Disposition: A | Discharge status: Home / Self Care | Payer: PRIVATE HEALTH INSURANCE | Attending: Radiation Oncology | Admitting: Radiation Oncology

## 2018-07-23 DIAGNOSIS — C61 Malignant neoplasm of prostate: Secondary | ICD-10-CM

## 2018-07-23 DIAGNOSIS — C7951 Secondary malignant neoplasm of bone: Secondary | ICD-10-CM

## 2018-07-23 DIAGNOSIS — C799 Secondary malignant neoplasm of unspecified site: Secondary | ICD-10-CM

## 2018-07-23 DIAGNOSIS — Z7189 Other specified counseling: Secondary | ICD-10-CM

## 2018-07-23 DIAGNOSIS — C679 Malignant neoplasm of bladder, unspecified: Secondary | ICD-10-CM

## 2018-07-23 MED ORDER — DEXAMETHASONE SODIUM PHOSPHATE 10 MG/ML IJ SOLN
8.0000 mg | Freq: Once | INTRAMUSCULAR | Status: AC
  Administered 2018-07-23: 8 mg via INTRAVENOUS
  Filled 2018-07-23: qty 1

## 2018-07-23 MED ORDER — HYDROMORPHONE HCL 1 MG/ML IJ SOLN
1.0000 mg | INTRAMUSCULAR | Status: AC
  Administered 2018-07-23: 1 mg via INTRAVENOUS
  Filled 2018-07-23: qty 1

## 2018-07-23 MED ORDER — MORPHINE SULFATE (PF) 4 MG/ML IV SOLN: 4.0000 mg | Freq: Once | INTRAVENOUS | Status: DC

## 2018-07-23 MED ORDER — HYDROMORPHONE HCL 1 MG/ML IJ SOLN
1.0000 mg | INTRAMUSCULAR | Status: DC | PRN
  Administered 2018-07-23 (×2): 1 mg via INTRAVENOUS
  Filled 2018-07-23 (×2): qty 1

## 2018-07-23 MED ORDER — HYDROMORPHONE HCL 1 MG/ML IJ SOLN
1.0000 mg | INTRAMUSCULAR | Status: DC | PRN
  Administered 2018-07-23 – 2018-07-24 (×7): 1 mg via INTRAVENOUS
  Filled 2018-07-23 (×7): qty 1

## 2018-07-23 MED ORDER — FENTANYL 75 MCG/HR TD PT72
1.0000 | MEDICATED_PATCH | TRANSDERMAL | Status: DC
  Administered 2018-07-23 – 2018-07-26 (×2): 1 via TRANSDERMAL
  Filled 2018-07-23 (×3): qty 1

## 2018-07-23 MED ORDER — LACTULOSE 10 GM/15ML PO SOLN: 20.0000 g | Freq: Every day | ORAL | Status: DC

## 2018-07-23 MED ORDER — CYCLOBENZAPRINE HCL 10 MG PO TABS
10.0000 mg | ORAL_TABLET | Freq: Three times a day (TID) | ORAL | Status: DC | PRN
  Administered 2018-07-23 – 2018-07-29 (×2): 10 mg via ORAL
  Filled 2018-07-23 (×2): qty 1

## 2018-07-23 MED ORDER — SORBITOL 70 % SOLN
960.0000 mL | TOPICAL_OIL | Freq: Once | ORAL | Status: DC
  Filled 2018-07-23 (×2): qty 473

## 2018-07-23 MED ORDER — BOOST / RESOURCE BREEZE PO LIQD CUSTOM
1.0000 | Freq: Three times a day (TID) | ORAL | Status: DC
  Administered 2018-07-23 – 2018-07-24 (×2): 1 via ORAL

## 2018-07-23 NOTE — Progress Notes (Signed)
Pt is transferring to [facility], report given to [place] RN at [facility]. Pt is reporting a pain of 10/10. All pain medication ordered has been given. Transfer education provided to pt, pt verbalize understanding. Pt will be transferred by care link.

## 2018-07-23 NOTE — Progress Notes (Signed)
Spoke with pt and pts daughter about being able to visit while in the hospital. After speaking with the [facility] [place], deemed no visitors at this time since pt is alert and orientated x4 and able to advocate for himself, educated pt and daughter on visitor policy and when visitors are allowed along with other options to visit virtually. Pt and daughter expressed understanding at the time.

## 2018-07-23 NOTE — Progress Notes (Signed)
Daily Progress Note   Patient Name: Rodney [name]       Date: 07/23/2018 DOB: 05-08-1970  Age: 48 y.o. MRN#: [id number] Attending Physician: Georgette Shell, MD Primary Care Physician: Curlene Labrum, MD Admit Date: 07/20/2018  Reason for Consultation/Follow-up: Pain control  Subjective: I met today with Rodney [name].  He reports that his pain has been better with transition to fentanyl from dilaudid, but that the 2mg dose of fentanyl does not seem to be strong enough to sufficiently relieve his pain.  We again reviewed his pain regimen in detail and discussed goal of finding effective regimen of long and short acting medications.  Discussed that he will benefit from continuation of radiation for pain relief as well.  He also reports that he thinks there may have been some confusion about his goals moving forward.  He reports that he wants to ensure that he is listed as a FULL CODE.  Length of Stay: 2  Current Medications: Scheduled Meds:  . bisacodyl  10 mg Oral Daily  . dexamethasone (DECADRON) injection  4 mg Intravenous BID  . enoxaparin (LOVENOX) injection  40 mg Subcutaneous Daily  . fentaNYL  1 patch Transdermal Q72H  . fentaNYL      . midazolam      . polyethylene glycol  17 g Oral Daily  . senna-docusate  2 tablet Oral BID  . sodium chloride flush  3 mL Intravenous Q12H    Continuous Infusions: . sodium chloride 100 mL/hr at 07/22/18 1953  . cefTRIAXone (ROCEPHIN)  IV 1 g (07/22/18 2313)    PRN Meds: acetaminophen **OR** acetaminophen, albuterol, fentaNYL (SUBLIMAZE) injection, ondansetron **OR** ondansetron (ZOFRAN) IV  Physical Exam         General: Alert, awake, in no distress  HEENT: No bruits, no goiter, no JVD Heart: Tachycardic. No murmur appreciated.  Lungs: Good air movement, clear Abdomen: Soft, nontender, nondistended, positive bowel sounds.  Ext: No significant edema Skin: Warm and dry Neuro: Grossly intact, nonfocal.  Vital Signs: BP (!) 131/99 (BP Location: Left Arm)   Pulse (!) 104   Temp 97.7 F (36.5 C) (Oral)   Resp 18   Ht 6' 2"  (1.88 m)   Wt 83.5 kg   SpO2 96%   BMI 23.62 kg/m  SpO2: SpO2: 96 % O2 Device:  O2 Device: Nasal Cannula O2 Flow Rate: O2 Flow Rate (L/min): 2 L/min  Intake/output summary:   Intake/Output Summary (Last 24 hours) at 07/23/2018 0002 Last data filed at 07/22/2018 2301 Gross per 24 hour  Intake 2280.6 ml  Output 3600 ml  Net -1319.4 ml   LBM: Last BM Date: 07/21/18 Baseline Weight: Weight: 83.5 kg Most recent weight: Weight: 83.5 kg       Palliative Assessment/Data:    Flowsheet Rows     Most Recent Value  Intake Tab  Referral Department  Hospitalist  Unit at Time of Referral  Med/Surg Unit  Palliative Care Primary Diagnosis  Cancer  Date Notified  07/21/18  Palliative Care Type  New Palliative care  Reason for referral  Pain  Date of Admission  07/20/18  # of days IP prior to Palliative referral  1  Clinical Assessment  Palliative Performance Scale Score  60%  Psychosocial & Spiritual Assessment  Palliative Care Outcomes      Patient Active Problem List   Diagnosis Date Noted  . Intractable pain 07/20/2018  . Constipation 07/20/2018  . Acute lower UTI 07/20/2018  . Leukocytosis 07/20/2018  . 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 Bladder cancer ([place]) 11/08/2017  . Genetic testing 10/26/2017  . Family history of prostate cancer   . Family history of gastric cancer   . Family history of melanoma   . Family history of colon cancer   . Urothelial cancer ([place])   . Malignant neoplasm of prostate ([place]) 09/11/2017    Palliative Care Assessment & Plan   Patient Profile: 48 y.o. male  with past medical history of  widely metastatic disease (biopsy performed 7/6) admitted on 07/20/2018 with uncontrolled pain.  Palliative care consulted for pain management.   Assessment: Patient Active Problem List   Diagnosis Date Noted  . Intractable pain 07/20/2018  . Constipation 07/20/2018  . Acute lower UTI 07/20/2018  . Leukocytosis 07/20/2018  . 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 Bladder cancer ([place]) 11/08/2017  . Genetic testing 10/26/2017  . Family history of prostate cancer   . Family history of gastric cancer   . Family history of melanoma   . Family history of colon cancer   . Urothelial cancer ([place])   . Malignant neoplasm of prostate ([place]) 09/11/2017     Recommendations/Plan: - Pain: Started on fentanyl patch 76mg yesterday.  Reports pain is better controlled with transition to fentanyl from dilaudid, but still not sufficient relief.  Will plan to increase rescue dose to fentanyl 1057m every 2 hours as needed.  Continue to monitor usage over next 24-48 hours and would then increase fentanyl patch and transition rescue to orals after develop better picture of how much medication necessary to control pain. Continue dexamethasone 56m27mID. - Discussed the fact that he has widely metastatic disease.  His goal is to have biopsy and then follow-up with Dr. ShaAlen Blew discuss options for treatment. - He reports that he would like to have his daughter, HeaNira Connct as his surrogate if he cannot make his own decisions.  Goals of Care and Additional Recommendations:  Limitations on Scope of Treatment: Full Scope Treatment  Code Status:    Code Status Orders  (From admission, onward)         Start     Ordered   07/22/18 1559  Full code  Continuous     07/22/18 1558  Code Status History    Date Active Date Inactive Code Status Order ID Comments User Context   07/22/2018 1327 07/22/2018 1558 DNR [id number]  Georgette Shell, MD Inpatient    07/20/2018 2217 07/22/2018 1327 Full Code [id number]  Norval Morton, MD ED   11/08/2017 1740 11/14/2017 1823 Full Code [id number]  Raynelle Bring, MD Inpatient   Advance Care Planning Activity       Prognosis:   Unable to determine  Discharge Planning:  To Be Determined  Care plan was discussed with patient, Dr. Rodena Piety  Thank you for allowing the Palliative Medicine Team to assist in the care of this patient.   Time In: 1520 Time Out: 1600 Total Time 40 Prolonged Time Billed NO      Greater than 50%  of this time was spent counseling and coordinating care related to the above assessment and plan.  Micheline Rough, MD  Please contact Palliative Medicine Team phone at [phone] for questions and concerns.

## 2018-07-23 NOTE — Progress Notes (Signed)
PROGRESS NOTE    Rodney [name]  TIR:[id number] DOB: Jan 20, 1970 DOA: 07/20/2018 PCP: Curlene Labrum, MD    Brief Narrative:48 y.o. male with medical history significant of bladder tumor, prostate cancer s/p robotic assisted cystoprostatectomy, anxiety, and depression; who presents with complaints of severe pain in his back over the last week.  Complains of having pain in the right shoulder moved to the left side and now goes down  his back wrapping around his chest.  He denies any recent falls or sustaining any trauma.  He reports utilizing his home pain medications, steroids, and muscle relaxants without relief of symptoms.  He has been unable to lift his legs due to symptoms.  Patient reports that he was supposed to have a biopsy of the left iliac crest lesion on 7/7.  Associated symptoms include constipation as patient reports last bowel movement was approximately 6 days ago.  Denies having any fever, shortness of breath, cough, or abdominal pain.  ED Course: Upon admission into the emergency department patient was noted to be afebrile, pulse up to 120, respirations up to 28, and O2 saturation relatively maintained on room air.  Labs revealed WBC 12.2.  Urinalysis positive for moderate leukocytes, many bacteria, and greater than 50 WBCs.  COVID-19 testing was negative.  Patient was initially seen at [facility] and transferred here to [facility] for need of MRI.  MRI revealed diffuse progression of bony mets throughout the spine and a large medial left iliac wing metastases with extraosseous extension of tumor increased from previous study done on 6/21.  Dr. Maylon Peppers of oncology recommended patient admission for pain control.  Patient may need palliative radiation but no need to transport to [place] [facility] at this time.  Patient was given Rocephin IV, dexamethasone 4 mg, fentanyl IV, and Dilaudid IV.  Assessment & Plan:   Principal Problem:   Intractable pain Active Problems:   Malignant neoplasm of  prostate (HCC)   Spine metastasis (HCC)   Constipation   Acute lower UTI   Leukocytosis  #1 metastatic urothelial cancer -patient admitted for pain control. Palliative care following for pain control.  We will transfer the patient to [facility] to continue ongoing radiation to his spine.  Appreciate XRT team getting this arranged quickly.  Patient is very anxious .he is status post radical cystoprostatectomy.  He also has prostate cancer.  But it is not really clear if it is prostate cancer or urothelial cancer that is metastatic.  However he had bone marrow biopsy done 07/22/2018 from his left iliac crest.  MRI of the spine consistent with widespread mets and increasing size of the lesions.  MRI of the spine  Widespread spinal and partially visible rib and pelvic metastases. Epidural and/or neural foraminal extension of tumor at the C3, T4, T6, T8, L1, L3, and L5. But no malignant spinal stenosis or cord compression. Multiple metastases throughout the spine are slightly larger since the MRIs last month, but there is no pathologic vertebral fracture.. No intradural metastasis.  A large medial left iliac wing metastasis with extraosseous extension of tumor has substantially increased since 07/07/18 (45 mm now versus 31 mm previously).  Palliative care following for pain control  On fentanyl 100 mcg q2 prn,duragesic patch 75 mcg,decadron for pain control patient still in pain  #2 constipation -had BM 7/5  continue stool softners and laxatives   #3  SIRS -patient met Sirs criteria upon admission with tachycardia tachypnea and leukocytosis.  UA appears dirty with leukocytes and  many bacteria started on Rocephin continue and follow-up urine culture.  Urine culture from 7 4 in progress.  #4 hyperkalemia with increase in creatinine improved with fluids   #5 hypercalcemia increase since admission from 10.4-10.8 possibly due to decreased p.o. intake and dehydration we will see if this improves with  IV fluids.  Recheck labs tomorrow.  DVT prophylaxis: lovenox Code Status: full Family Communication: dw daughter Nira Conn and his fiance. Disposition Plan transfer to [facility] for ongoing care and XRT. Consults called XRT palliative care discussed with Dr. Benay Spice on the phone since Dr. [name] is out of town for the whole week.  Estimated body mass index is 23.62 kg/m as calculated from the following:   Height as of this encounter: 6' 2"  (1.88 m).   Weight as of this encounter: 83.5 kg.    Subjective: somewhat better than yesterday but still with lots of discomfort and pain very anxious wanting to have radiation done ASAP Objective: Vitals:   07/22/18 2003 07/23/18 0021 07/23/18 0453 07/23/18 0843  BP: (!) 131/99 (!) 142/95 (!) 137/95 (!) 136/91  Pulse: (!) 104 94 97 (!) 106  Resp: 18 18 18 17   Temp: 97.7 F (36.5 C) 98 F (36.7 C) 97.7 F (36.5 C) 98.6 F (37 C)  TempSrc: Oral Oral Oral Oral  SpO2: 96% 96% 98% 97%  Weight:      Height:        Intake/Output Summary (Last 24 hours) at 07/23/2018 0910 Last data filed at 07/23/2018 0455 Gross per 24 hour  Intake 800.6 ml  Output 3450 ml  Net -2649.4 ml   Filed Weights   07/20/18 1019  Weight: 83.5 kg    Examination: Oral mucosa dry  General exam: Appears restless uncomfortable in distress due to excruciating back pain  Respiratory system: Clear to auscultation. Respiratory effort normal. Cardiovascular system: S1 & S2 heard, RRR. No JVD, murmurs, rubs, gallops or clicks. No pedal edema. Gastrointestinal system: Abdomen is distended, soft and generalized tenderness. No organomegaly or masses felt. Normal bowel sounds heard. Central nervous system: Alert and oriented. No focal neurological deficits. Extremities: Symmetric 5 x 5 power. Skin: No rashes, lesions or ulcers Psychiatry: Judgement and insight appear normal. Mood & affect appropriate.     Data Reviewed: I have personally reviewed following labs  and imaging studies  CBC: Recent Labs  Lab 07/20/18 1053 07/21/18 0743  WBC 12.2* 12.2*  NEUTROABS 10.1*  --   HGB 13.1 13.1  HCT 41.5 41.5  MCV 100.2* 99.3  PLT 388 710   Basic Metabolic Panel: Recent Labs  Lab 07/20/18 1053 07/21/18 0743 07/22/18 0712  NA 135 135 134*  K 4.3 5.2* 4.8  CL 96* 96* 99  CO2 26 28 26   GLUCOSE 129* 97 117*  BUN 25* 23* 23*  CREATININE 0.93 1.05 0.78  CALCIUM 10.4* 10.7* 10.2   GFR: Estimated Creatinine Clearance: 128.4 mL/min (by C-G formula based on SCr of 0.78 mg/dL). Liver Function Tests: Recent Labs  Lab 07/20/18 1527 07/22/18 0712  AST 14* 17  ALT 19 16  ALKPHOS 129* 110  BILITOT 0.6 0.6  PROT 6.9 6.7  ALBUMIN 2.8* 2.4*   Recent Labs  Lab 07/20/18 1527  LIPASE 20   No results for input(s): AMMONIA in the last 168 hours. Coagulation Profile: Recent Labs  Lab 07/20/18 1053  INR 1.1   Cardiac Enzymes: No results for input(s): CKTOTAL, CKMB, CKMBINDEX, TROPONINI in the last 168 hours. BNP (last 3 results) No  results for input(s): PROBNP in the last 8760 hours. HbA1C: No results for input(s): HGBA1C in the last 72 hours. CBG: No results for input(s): GLUCAP in the last 168 hours. Lipid Profile: No results for input(s): CHOL, HDL, LDLCALC, TRIG, CHOLHDL, LDLDIRECT in the last 72 hours. Thyroid Function Tests: No results for input(s): TSH, T4TOTAL, FREET4, T3FREE, THYROIDAB in the last 72 hours. Anemia Panel: No results for input(s): VITAMINB12, FOLATE, FERRITIN, TIBC, IRON, RETICCTPCT in the last 72 hours. Sepsis Labs: Recent Labs  Lab 07/21/18 0039  LATICACIDVEN 1.0    Recent Results (from the past 240 hour(s))  SARS Coronavirus 2 (CEPHEID - Performed in [facility] hospital lab), Hosp Order     Status: None   Collection Time: 07/20/18 11:06 AM   Specimen: Nasopharyngeal Swab  Result Value Ref Range Status   SARS Coronavirus 2 NEGATIVE NEGATIVE Final    Comment: (NOTE) If result is NEGATIVE SARS-CoV-2  target nucleic acids are NOT DETECTED. The SARS-CoV-2 RNA is generally detectable in upper and lower  respiratory specimens during the acute phase of infection. The lowest  concentration of SARS-CoV-2 viral copies this assay can detect is 250  copies / mL. A negative result does not preclude SARS-CoV-2 infection  and should not be used as the sole basis for treatment or other  patient management decisions.  A negative result may occur with  improper specimen collection / handling, submission of specimen other  than nasopharyngeal swab, presence of viral mutation(s) within the  areas targeted by this assay, and inadequate number of viral copies  (<250 copies / mL). A negative result must be combined with clinical  observations, patient history, and epidemiological information. If result is POSITIVE SARS-CoV-2 target nucleic acids are DETECTED. The SARS-CoV-2 RNA is generally detectable in upper and lower  respiratory specimens dur ing the acute phase of infection.  Positive  results are indicative of active infection with SARS-CoV-2.  Clinical  correlation with patient history and other diagnostic information is  necessary to determine patient infection status.  Positive results do  not rule out bacterial infection or co-infection with other viruses. If result is PRESUMPTIVE POSTIVE SARS-CoV-2 nucleic acids MAY BE PRESENT.   A presumptive positive result was obtained on the submitted specimen  and confirmed on repeat testing.  While 2019 novel coronavirus  (SARS-CoV-2) nucleic acids may be present in the submitted sample  additional confirmatory testing may be necessary for epidemiological  and / or clinical management purposes  to differentiate between  SARS-CoV-2 and other Sarbecovirus currently known to infect humans.  If clinically indicated additional testing with an alternate test  methodology [phone]) is advised. The SARS-CoV-2 RNA is generally  detectable in upper and lower  respiratory sp ecimens during the acute  phase of infection. The expected result is Negative. Fact Sheet for Patients:  StrictlyIdeas.no Fact Sheet for Healthcare Providers: BankingDealers.co.za This test is not yet approved or cleared by the [place] FDA and has been authorized for detection and/or diagnosis of SARS-CoV-2 by FDA under an Emergency Use Authorization (EUA).  This EUA will remain in effect (meaning this test can be used) for the duration of the COVID-19 declaration under Section 564(b)(1) of the Act, 21 U.S.C. section 360bbb-3(b)(1), unless the authorization is terminated or revoked sooner. Performed at [facility], [address], [place], [place] [address]          Radiology Studies: Ct Biopsy  Result Date: 07/27/2018 INDICATION: 48 year old with presumed metastatic urothelial cancer. Patient has bone and  liver lesions. Patient has a destructive lytic lesion involving the left iliac bone. EXAM: CT-GUIDED BIOPSY OF LEFT ILIAC BONE LESION MEDICATIONS: None. ANESTHESIA/SEDATION: Moderate (conscious) sedation was employed during this procedure. A total of Versed 2.0 mg and Fentanyl 100 mcg was administered intravenously. Moderate Sedation Time: 15 minutes. The patient's level of consciousness and vital signs were monitored continuously by radiology nursing throughout the procedure under my direct supervision. FLUOROSCOPY TIME:  None COMPLICATIONS: None immediate. PROCEDURE: Informed written consent was obtained from the patient after a thorough discussion of the procedural risks, benefits and alternatives. All questions were addressed. A timeout was performed prior to the initiation of the procedure. Patient was placed on his right side. CT images through the pelvis were obtained. The destructive lesion involving the left iliac bone was identified. Left side of the pelvis was prepped with chlorhexidine and sterile field was  created. Skin and soft tissues were anesthetized with 1% lidocaine. Using CT guidance, 17 gauge coaxial needle was directed along the posterior aspect of the lesion. Total of 4 core biopsies were obtained with an 18 gauge device. Specimens placed in formalin. Needle was removed without complication. Bandage placed over the puncture site. FINDINGS: Again noted is large lytic lesion involving the left ilium with destruction of bone. Core biopsies were obtained from the soft tissue component of the lesion. IMPRESSION: CT-guided core biopsies of the left iliac bone lesion. Electronically Signed   By: Markus Daft M.D.   On: 07/22/2018 14:31        Scheduled Meds:  bisacodyl  10 mg Oral Daily   dexamethasone (DECADRON) injection  4 mg Intravenous BID   enoxaparin (LOVENOX) injection  40 mg Subcutaneous Daily   fentaNYL  1 patch Transdermal Q72H   polyethylene glycol  17 g Oral Daily   senna-docusate  2 tablet Oral BID   sodium chloride flush  3 mL Intravenous Q12H   Continuous Infusions:  sodium chloride 100 mL/hr at 07/22/18 1953   cefTRIAXone (ROCEPHIN)  IV 1 g (07/22/18 2313)     LOS: 2 days     Georgette Shell, MD [facility]  If 7PM-7AM, please contact night-coverage [email or web address] Password [email or web address] 07/23/2018, 9:10 AM

## 2018-07-23 NOTE — Progress Notes (Signed)
Daily Progress Note   Patient Name: Rodney [name]       Date: 07/23/2018 DOB: August 14, 1970  Age: 48 y.o. MRN#: [id number] Attending Physician: Georgette Shell, MD Primary Care Physician: Curlene Labrum, MD Admit Date: 07/20/2018  Reason for Consultation/Follow-up: Pain control and Psychosocial/spiritual support  Subjective: Rodney [name] reports 10/10 pain radiating around his back and upper abdomen that is unbearable with little relief from IV fentanyl.   Length of Stay: 2  Current Medications: Scheduled Meds:  . dexamethasone (DECADRON) injection  4 mg Intravenous BID  . enoxaparin (LOVENOX) injection  40 mg Subcutaneous Daily  . feeding supplement  1 Container Oral TID BM  . fentaNYL  1 patch Transdermal Q72H  . [START ON 07/24/2018] lactulose  20 g Oral Daily  . polyethylene glycol  17 g Oral Daily  . sodium chloride flush  3 mL Intravenous Q12H  . sorbitol, milk of mag, mineral oil, glycerin (SMOG) enema  960 mL Rectal Once    Continuous Infusions: . sodium chloride 100 mL/hr at 07/22/18 1953  . cefTRIAXone (ROCEPHIN)  IV 1 g (07/22/18 2313)    PRN Meds: acetaminophen **OR** acetaminophen, albuterol, cyclobenzaprine, fentaNYL (SUBLIMAZE) injection, ondansetron **OR** ondansetron (ZOFRAN) IV  Physical Exam Vitals signs and nursing note reviewed.  Constitutional:      Appearance: He is ill-appearing.     Comments: Pain distress  Cardiovascular:     Rate and Rhythm: Normal rate.  Pulmonary:     Effort: Pulmonary effort is normal. No tachypnea, accessory muscle usage or respiratory distress.  Abdominal:     Tenderness: There is generalized abdominal tenderness.  Neurological:     Mental Status: He is alert and oriented to person, place, and time.             Vital Signs:  BP (!) 136/91 (BP Location: Left Arm)   Pulse (!) 106   Temp 98.6 F (37 C) (Oral)   Resp 17   Ht 6' 2" (1.88 m)   Wt 83.5 kg   SpO2 97%   BMI 23.62 kg/m  SpO2: SpO2: 97 % O2 Device: O2 Device: Room Air O2 Flow Rate: O2 Flow Rate (L/min): 2 L/min  Intake/output summary:   Intake/Output Summary (Last 24 hours) at 07/23/2018 1020 Last data filed at 07/23/2018 0455 Gross per 24  hour  Intake 800.6 ml  Output 3450 ml  Net -2649.4 ml   LBM: Last BM Date: 07/21/18 Baseline Weight: Weight: 83.5 kg Most recent weight: Weight: 83.5 kg       Palliative Assessment/Data:    Flowsheet Rows     Most Recent Value  Intake Tab  Referral Department  Hospitalist  Unit at Time of Referral  Med/Surg Unit  Palliative Care Primary Diagnosis  Cancer  Date Notified  07/21/18  Palliative Care Type  New Palliative care  Reason for referral  Pain  Date of Admission  07/20/18  # of days IP prior to Palliative referral  1  Clinical Assessment  Palliative Performance Scale Score  60%  Psychosocial & Spiritual Assessment  Palliative Care Outcomes      Patient Active Problem List   Diagnosis Date Noted  . Intractable pain 07/20/2018  . Constipation 07/20/2018  . Acute lower UTI 07/20/2018  . Leukocytosis 07/20/2018  . Spine metastasis ([place]) 07/16/2018  . 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 Bladder cancer ([place]) 11/08/2017  . Genetic testing 10/26/2017  . Family history of prostate cancer   . Family history of gastric cancer   . Family history of melanoma   . Family history of colon cancer   . Urothelial cancer ([place])   . Malignant neoplasm of prostate ([place]) 09/11/2017    Palliative Care Assessment & Plan   HPI: 48 yo male with PMH significant of bladder tumor, prostate cancer s/p robotic assisted cystoprostatectomy, bradycardia, anxiety, depression admitted 07/20/18 with worsening back pain without any trauma/falls with plans for biopsy of left iliac crest for 07/23/18.  LBM 6 days PTA and no relief of pain with use of home opioids, steroids, and muscle relaxer. Hospitalization has been complicated by continued poorly controlled pain.   Assessment: I met today with Rodney [name] and joined by his significant other, Rodney [name], who is very supportive. Discussed pain management with Rodney [name] who reports that his pain has continued to worsen with little relief. He feels pressure in his abdomen and feels constipated. He has severe pain in RUQ and LUQ of abd that radiates around his front and back. He feels that the pain has continued to escalate with little relief and unable to sleep or rest. He can barely speak s/t pain severity. He denies any SOB or chest pain.   His goals are clear to transfer to [facility] to pursue radiation therapy. He confirms that he desires full code and is motivated to begin treatment with hopes of improved pain management and to treat the cancer.   We discussed pain regimen as outlined below.   Recommendations/Plan:  Acute on chronic RUQ pain with severe radiation to back and across upper abdomen:   Increase fentanyl patch from 50 mcg/hr to 75 mcg/hr.  D/C IV fentanyl. Add IV dilaudid 1 mg every 3 hours prn.   Continue decadron. Give one time dose decadron 8 mg IV now.   Consider addition of neuropathic adjunctive such as gabapentin or Lyrica possibly in combination with Cymbalta.   Constipation:   SMOG enema x 1. This worked for him 7/5.   D/C senokot and add lactulose.   Goal is BM daily.   Goals of Care and Additional Recommendations:  Limitations on Scope of Treatment: Full Scope Treatment  Code Status:  Full code  Prognosis:   Overall prognosis is poor with extensive spinal mets and pain effecting functional status.   Discharge Planning:  To Be Determined  Thank you for  allowing the Palliative Medicine Team to assist in the care of this patient.   Total Time 45 min Prolonged Time Billed  no       Greater than 50%  of this time  was spent counseling and coordinating care related to the above assessment and plan.  Vinie Sill, NP Palliative Medicine Team Pager # [phone] (M-F 8a-5p) Team Phone # [phone] (Nights/Weekends)

## 2018-07-23 NOTE — Progress Notes (Signed)
Pt c/o of increase pain and constipation this morning. pt is anxious about his radiation treatment and want to clarify his goals of care. Pt will prefer to go back to [place] or [place] [facility]. RN provided support and listening. MD notified, MD will follow up. Palliative notified for symptom management. (pain/constipation).  RN will continue to reassess pt.

## 2018-07-23 NOTE — Consult Note (Signed)
Radiation Oncology         [phone] ________________________________  Name: Rodney [name]        MRN: [id number]  Date of Service: 07/23/2018 DOB: November 15, 1970  [id number], Rodney Evener, MD  No ref. provider found     REFERRING PHYSICIAN: Dr. 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   HISTORY OF PRESENT ILLNESS: Rodney [name] is a 48 y.o. male seen at the request of Dr. Zigmund [name] for a diagnosis of a Gleason 3+4 adenocarcinoma of the prostate as well as a T1c urothelial bladder cancer that was treated with cystoprostatectomy and neobladder creation in October 2019.  He developed mid to low back pain recently and MRI scans showed diffuse osseous metastatic disease throughout the thoracic and lumbar spine as well as a 3 cm expansile metastasis in the left iliac bone.  His case was discussed at urology conference and recommendations were to proceed with restaging imaging as well as a biopsy of his iliac lesion.  He was scheduled for CT imaging on 07/30/2018 but in the interim presented to the [facility] emergency department on 07/05/2018. This revealed Foley catheter in a decompressed neobladder no hydronephrosis, and no acute findings.  He returned on 07/07/2018 to the emergency department and an MRI of the lumbar spine revealed multiple enhancing lesions throughout the lumbar and lower thoracic spine and sacrum they were seen from T11-S1, and a 3 cm mass in the left iliac bone was also noted.  No epidural tumor was identified.  He was set up to be seen by radiation oncology here however declined  coming to [facility] for treatment and was set up to see Rodney [name].  Additional imaging revealed disease in the cervical spine as well.  Thoracic disease was noted between T2 and T10, and he began palliative radiotherapy to C2-4, T2-10, and was considering palliative radiation to the left iliac site following his planned outpatient biopsy.  He developed acute pain crisis and presented to the hospital on 07/20/2018 at [facility] and was sent to [facility] for the need for MR.  CT of the abdomen and pelvis revealed multiple small hepatic metastatic lesions, multiple lytic lesions in the ribs spine and pelvis, and postsurgical changes noted in the bladder region.  An MRI of the cervical thoracic and lumbar spine was performed, there was a lesion at C3 vertebral body mass that was approximately 13 mm without epidural or extraosseous extension, subtle C2 endplate metastasis also without extraosseous extension.  The cord was within normal limits in the cervical spine.  In the thoracic spine disease at T2 was noted with dorsal epidural space extension, T4 metastasis with epidural extension measuring 19 mm and to the right of the midline, disease at T5 spinous processes and enlarging right costovertebral junction lesion extension into the right T5 neural foramen without impingement was identified.  T6 left inferior pedicle metastasis and posterior left sixth rib lesion was redemonstrated.  Epidural extension into the left T6 neural foramen was stable, left T7 vertebral body metastasis was also stable without extraosseous extension.  Both the left side metastasis and ventral epidural changes were noted to the left of T8 neural  foramen, this was approximately 31 mm.  Possible T9 endplate metastasis was seen along with bilateral ninth rib lesion, this was noted at 24 mm, T10 appeared to be spared and there was endplate disease at L I71 and T12 without extraosseous expansion.  Left 12 costovertebral junction metastasis was  noted no abnormal intradural enhancement was appreciated of the cord.  Early ventral epidural extension was seen at L1, L2 and L3 showed persistent disease with mildly enlarged involvement in the anterior superior endplate on the L3 site with early extension into the inferior left L3 neural foramen previous left L4 laminectomy and small right L4 vertebral metastasis was noted.  The persistence of his left bulky iliac wing lesion had increased to 45 mm, a 24 mm left ventral S1 metastasis with extraosseous extension in the presacral space was also mildly increased.  No abnormal intradural enhancement or dural thickening was noted of the cord.  We were asked to see the patient to consider resuming radiotherapy in [name] as his pain keeps him in the hospital and does not appear to be controlled enough for him to discharge to resume outpatient care in [place].  I discussed his case with Rodney [name] as well.  The patient will be transferred to [facility] for further consideration of radiotherapy.     PREVIOUS RADIATION THERAPY: Yes   07/18/2018-07/19/2018: C2-4 and T2-10 were treated to 6 Gy in 2 out of the 10 planned fractions   PAST MEDICAL HISTORY:  Past Medical History:  Diagnosis Date   Anxiety    Bladder tumor    Blood in urine    saw some 3 days ago but none inthe last 2 days    Bradycardia    Cancer (HCC)    Depression    Dizziness    historical    Elevated PSA    Family history of colon cancer    Family history of gastric cancer    Family history of melanoma    Family history of prostate cancer    Heartburn    occasional    Pain with urination    Prostate cancer ([place])    Urothelial cancer ([place])    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[place])        PAST SURGICAL HISTORY: Past Surgical History:  Procedure Laterality Date   BACK SURGERY  2012   3 disc; dr Rodney [name]  did first and dr Rodney [name] did the last 2    CYSTOSCOPY W/ RETROGRADES N/A 07/02/2017   Procedure: CYSTOSCOPY WITH  RETROGRADE PYELOGRAM/EXAM UNDER ANESTHESIA;  Surgeon: Raynelle Bring, MD;  Location: [facility];  Service: Urology;  Laterality: N/A;   CYSTOSCOPY WITH BIOPSY N/A 08/09/2017   Procedure: CYSTOSCOPY WITH PROSTATE NEEDLE BIOPSY;  Surgeon: Raynelle Bring, MD;  Location: [facility];  Service: Urology;  Laterality: N/A;  GENERAL ANESTHESIA WITH PARALYSIS/ ONLY NEEDS 60 MIN FOR ALL PROCEDURES   LEG SURGERY  2012   4 metal plates in plates    TRANSRECTAL ULTRASOUND N/A 08/09/2017   Procedure: TRANSRECTAL ULTRASOUND;  Surgeon: Raynelle Bring, MD;  Location: [facility];  Service: Urology;  Laterality: N/A;  ONLY NEEDS 60 MIN FOR ALL PROCEDURES   TRANSURETHRAL RESECTION OF BLADDER TUMOR N/A 07/02/2017   Procedure: TRANSURETHRAL RESECTION OF BLADDER TUMOR (TURBT);  Surgeon: Raynelle Bring, MD;  Location: [facility];  Service: Urology;  Laterality: N/A;   TRANSURETHRAL RESECTION OF BLADDER TUMOR N/A 08/09/2017   Procedure: TRANSURETHRAL RESECTION OF BLADDER TUMOR (TURBT);  Surgeon: Raynelle Bring, MD;  Location: [facility];  Service:  Urology;  Laterality: N/A;     FAMILY HISTORY:  Family History  Problem Relation Age of Onset   Heart attack Father    Hypertension Father    Diabetes Father    Heart failure Father    Hypertension Mother    Gastric cancer Sister 49       d. 30   Prostate cancer Brother 38   Melanoma Maternal Aunt    Stroke Maternal Uncle    Stroke Paternal Uncle    Melanoma Maternal Grandmother        dx under 44   Colon cancer Maternal Grandmother    Heart disease Maternal Grandfather    Heart attack Paternal Grandfather      SOCIAL HISTORY:  reports that he quit smoking about 2 years ago. His smoking use included cigarettes. He has a 16.00 pack-year smoking history. He has quit using smokeless tobacco. He reports previous alcohol use. He reports previous drug use. Drug: Marijuana. The patient is single. He lives in [place].    ALLERGIES: Morphine and related and Vicodin  [hydrocodone-acetaminophen]   MEDICATIONS:  Current Facility-Administered Medications  Medication Dose Route Frequency Provider Last Rate Last Dose   0.9 %  sodium chloride infusion   Intravenous Continuous Georgette Shell, MD 100 mL/hr at 07/23/18 1236     acetaminophen (TYLENOL) tablet 650 mg  650 mg Oral Q6H PRN Norval Morton, MD   650 mg at 07/21/18 1018   Or   acetaminophen (TYLENOL) suppository 650 mg  650 mg Rectal Q6H PRN Fuller Plan A, MD       albuterol (PROVENTIL) (2.5 MG/3ML) 0.083% nebulizer solution 2.5 mg  2.5 mg Nebulization Q4H PRN Tamala Julian, Rondell A, MD       cefTRIAXone (ROCEPHIN) 1 g in sodium chloride 0.9 % 100 mL IVPB  1 g Intravenous Q24H Smith, Rondell A, MD 200 mL/hr at 07/22/18 2313 1 g at 07/22/18 2313   cyclobenzaprine (FLEXERIL) tablet 10 mg  10 mg Oral TID PRN Pershing Proud, NP       dexamethasone (DECADRON) injection 4 mg  4 mg Intravenous BID Micheline Rough, MD   4 mg at 07/23/18 0839   enoxaparin (LOVENOX) injection 40 mg  40 mg Subcutaneous Daily Monia Sabal, PA-C   40 mg at 07/23/18 0931   feeding supplement (BOOST / RESOURCE BREEZE) liquid 1 Container  1 Container Oral TID BM Pershing Proud, NP       fentaNYL (DURAGESIC) 75 MCG/HR 1 patch  1 patch Transdermal J62E Pershing Proud, NP   1 patch at 07/23/18 1104   HYDROmorphone (DILAUDID) injection 1 mg  1 mg Intravenous Z6O PRN Pershing Proud, NP       [START ON 07/24/2018] lactulose (CHRONULAC) 10 GM/15ML solution 20 g  20 g Oral Daily Pershing Proud, NP       ondansetron (ZOFRAN) tablet 4 mg  4 mg Oral Q6H PRN Fuller Plan A, MD       Or   ondansetron (ZOFRAN) injection 4 mg  4 mg Intravenous Q6H PRN Fuller Plan A, MD   4 mg at 07/23/18 0624   polyethylene glycol (MIRALAX / GLYCOLAX) packet 17 g  17 g Oral Daily Domingo Cocking, Gene, MD   17 g at 07/23/18 0930   sodium chloride flush (NS) 0.9 % injection 3 mL  3 mL Intravenous Q12H Smith, Rondell A, MD   3 mL at 07/23/18  1104   sorbitol, milk of mag, mineral oil,  glycerin (SMOG) enema  960 mL Rectal Once Pershing Proud, NP         REVIEW OF SYSTEMS: On review of systems, the patient reports that he is still in severe pain. He reports his pain in his mid spine is the worst and radiates around the side primarly along the right into the RUQ. He denies any progressive pain but has persistent pain in his neck and left hip as well. No other complaints are noted.      PHYSICAL EXAM:  Wt Readings from Last 3 Encounters:  07/20/18 184 lb (83.5 kg)  07/12/18 189 lb 14.4 oz (86.1 kg)  07/07/18 194 lb (88 kg)   Temp Readings from Last 3 Encounters:  07/23/18 97.8 F (36.6 C) (Oral)  07/12/18 99.1 F (37.3 C) (Oral)  07/07/18 98.6 F (37 C) (Oral)   BP Readings from Last 3 Encounters:  07/23/18 (!) 136/95  07/12/18 125/89  07/07/18 (!) 124/93   Pulse Readings from Last 3 Encounters:  07/23/18 92  07/12/18 88  07/07/18 85   Pain Assessment Pain Score: 10-Worst pain ever/10  In general this is a tired appearing caucasian male in no acute distress. He is alert and oriented x4 and appropriate throughout the examination. HEENT reveals that the patient is normocephalic, atraumatic. EOMs are intact. PERRLA. Skin is intact without any evidence of gross lesions. Cardiopulmonary assessment is negative for acute distress and he exhibits normal effort.    ECOG = 4  0 - Asymptomatic (Fully active, able to carry on all predisease activities without restriction)  1 - Symptomatic but completely ambulatory (Restricted in physically strenuous activity but ambulatory and able to carry out work of a light or sedentary nature. For example, light housework, office work)  2 - Symptomatic, <50% in bed during the day (Ambulatory and capable of all self care but unable to carry out any work activities. Up and about more than 50% of waking hours)  3 - Symptomatic, >50% in bed, but not bedbound (Capable of only limited  self-care, confined to bed or chair 50% or more of waking hours)  4 - Bedbound (Completely disabled. Cannot carry on any self-care. Totally confined to bed or chair)  5 - Death   Eustace Pen MM, Creech RH, Tormey DC, et al. [phone]). "Toxicity and response criteria of the [facility]". [place] Oncol. 5 (6): 649-55    LABORATORY DATA:  Lab Results  Component Value Date   WBC 12.2 (H) 07/21/2018   HGB 13.1 07/21/2018   HCT 41.5 07/21/2018   MCV 99.3 07/21/2018   PLT 384 07/21/2018   Lab Results  Component Value Date   NA 134 (L) 07/22/2018   K 4.8 07/22/2018   CL 99 07/22/2018   CO2 26 07/22/2018   Lab Results  Component Value Date   ALT 16 07/22/2018   AST 17 07/22/2018   ALKPHOS 110 07/22/2018   BILITOT 0.6 07/22/2018      RADIOGRAPHY: Mr Cervical Spine W Or Wo Contrast  Result Date: 07/20/2018 CLINICAL DATA:  48 year old male with metastatic prostate cancer. EXAM: MRI TOTAL SPINE WITHOUT AND WITH CONTRAST TECHNIQUE: Multisequence MR imaging of the spine from the cervical spine to the sacrum was performed prior to and following IV contrast administration for evaluation of spinal metastatic disease. CONTRAST:  8 milliliters Gadavist COMPARISON:  Cervical and thoracic MRI 07/16/2018. Lumbar MRI 07/07/2018. FINDINGS: MRI CERVICAL SPINE FINDINGS Alignment: Stable cervical lordosis. Vertebrae: C3 vertebral body metastasis redemonstrated and  stable in size, approximately 13 millimeters diameter. No epidural or extraosseous extension. There is a subtle C2 inferior endplate metastasis (series 6, image 7) with no extraosseous extension, increased. No other cervical spine metastasis; mild degenerative marrow edema suspected in the right C3 inferior articulating facet is also stable. No metastasis at the visible skull base. Cord: Spinal cord signal is within normal limits at all visualized levels. No abnormal intradural enhancement. No dural thickening. Posterior Fossa,  vertebral arteries, paraspinal tissues: Cervicomedullary junction is within normal limits. Negative visible posterior fossa. Preserved major vascular flow voids in the neck. The right vertebral artery appears mildly dominant. Disc levels: Stable degenerative changes with spinal stenosis at C3-C4, C5-C6 and C6-C7 again noted. Degenerative cervical neural foraminal stenosis as detailed last month. MRI THORACIC SPINE FINDINGS Segmentation:  Normal. Alignment:  Stable thoracic kyphosis. Vertebrae: Widespread thoracic metastases. A small T2 metastasis is redemonstrated in the midline lamina, and demonstrates early dorsal epidural space extension on series 7, image 7 which appears increased. Posterior vertebral body T4 metastasis with epidural extension to the right of midline measures about 19 millimeters diameter and has not significantly changed. There is no associated spinal stenosis, although there is early epidural involvement of the right T4 neural foramen on series 30, image 6, stable. Metastasis in the T5 spinous process and enlarging the right costovertebral junction is stable (the latter up to 22 millimeters). There is early extension into the right T5 neural foramen but no associated neural impingement. T6 left inferior pedicle metastasis and posterior left 6th rib metastasis redemonstrated, the former with epidural extension into the left T6 neural foramen on series 29, image 13, stable. Left T7 vertebral body metastasis is stable without extraosseous extension. Bulky left side metastasis with ventral epidural and left T8 neural foraminal extension is stable measuring about 31 millimeters diameter. There is associated left foraminal stenosis, stable. Possible small T9 posterior inferior endplate metastasis is stable, and there are expansile bilateral posterior 9th rib metastases located near the costovertebral junctions (series 32, image 22). The larger is 24 millimeters. The T10 level appears spared.  Superior endplate metastases at O84 and T12 are redemonstrated without extraosseous extension. There is a left 12 costovertebral junction metastasis, stable (series 33, image 33). Cord: Capacious spinal canal. Spinal cord signal is within normal limits at all visualized levels. No abnormal intradural enhancement. No dural thickening. Paraspinal and other soft tissues: Small pleural effusions. Disc levels: No thoracic spinal stenosis despite ventral epidural tumor at T4 and T8. There is stable neural foraminal impingement at the left T6 and T8 levels. MRI LUMBAR SPINE FINDINGS Segmentation:  Normal. Alignment:  Stable mild straightening of lumbar lordosis. Vertebrae: Multilevel lumbar and partially visible sac, T2, ral and pelvic metastases. L1 posterosuperior endplate, right side vertebral body, and right inferior articulating facet metastases re-demonstrated. There is early ventral epidural extension of tumor with the former (series 35, image 10) which has increased since 07/07/2018. The facet metastasis has also increased. Small L2 superior endplate metastasis is stable without extraosseous extension. Three L3 vertebral body metastases, with mildly enlarged involvement at the anterior superior endplate since 16/60/6301, and early extension into the inferior left L3 neural foramen which is stable (series 38, image 16). Previous left L4 laminectomy. A small right L4 vertebral metastasis is minimally increased on series 37, image 5. A midline posterosuperior endplate L5 metastasis with early ventral epidural extension also appears slightly larger on series 38, image 23. At that same level a bulky metastasis in the left iliac wing  with extraosseous extension has increased from 31-45 millimeters over the same time. 24 millimeter left ventral S1 metastasis with early extraosseous extension into the presacral space has also mildly increased. There is no sacral metastasis related neural impingement identified. Conus  medullaris: Extends to the T12-L1 level and appears normal. No lower spinal cord or conus signal abnormality. Normal cauda equina nerve roots. No abnormal intradural enhancement or dural thickening. Paraspinal and other soft tissues: Stable visualized abdominal viscera. Disc levels: No lumbar spinal stenosis or malignant neural impingement despite early epidural and foraminal involvement at several levels. IMPRESSION: 1. 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 Epidural and/or neural foraminal extension of tumor at the C3, T4, T6, T8, L1, L3, and L5. But no malignant spinal stenosis or cord compression. Multiple metastases throughout the spine are slightly larger since the MRIs last month, but there is no pathologic vertebral fracture. 2. No intradural metastasis. 3. A large medial left iliac wing metastasis with extraosseous extension of tumor has substantially increased since 07/07/18 (45 mm now versus 31 mm previously). Electronically Signed   By: Genevie Ann M.D.   On: 07/20/2018 21:16   Mr Thoracic Spine W Wo Contrast  Result Date: 07/20/2018 CLINICAL DATA:  48 year old male with metastatic prostate cancer. EXAM: MRI TOTAL SPINE WITHOUT AND WITH CONTRAST TECHNIQUE: Multisequence MR imaging of the spine from the cervical spine to the sacrum was performed prior to and following IV contrast administration for evaluation of spinal metastatic disease. CONTRAST:  8 milliliters Gadavist COMPARISON:  Cervical and thoracic MRI 07/16/2018. Lumbar MRI 07/07/2018. FINDINGS: MRI CERVICAL SPINE FINDINGS Alignment: Stable cervical lordosis. Vertebrae: C3 vertebral body metastasis redemonstrated and stable in size, approximately 13 millimeters diameter. No epidural or extraosseous extension. There is a subtle C2 inferior endplate metastasis (series 6, image 7) with no extraosseous extension, increased. No other cervical spine metastasis; mild degenerative marrow edema suspected in the right C3 inferior  articulating facet is also stable. No metastasis at the visible skull base. Cord: Spinal cord signal is within normal limits at all visualized levels. No abnormal intradural enhancement. No dural thickening. Posterior Fossa, vertebral arteries, paraspinal tissues: Cervicomedullary junction is within normal limits. Negative visible posterior fossa. Preserved major vascular flow voids in the neck. The right vertebral artery appears mildly dominant. Disc levels: Stable degenerative changes with spinal stenosis at C3-C4, C5-C6 and C6-C7 again noted. Degenerative cervical neural foraminal stenosis as detailed last month. MRI THORACIC SPINE FINDINGS Segmentation:  Normal. Alignment:  Stable thoracic kyphosis. Vertebrae: Widespread thoracic metastases. A small T2 metastasis is redemonstrated in the midline lamina, and demonstrates early dorsal epidural space extension on series 7, image 7 which appears increased. Posterior vertebral body T4 metastasis with epidural extension to the right of midline measures about 19 millimeters diameter and has not significantly changed. There is no associated spinal stenosis, although there is early epidural involvement of the right T4 neural foramen on series 30, image 6, stable. Metastasis in the T5 spinous process and enlarging the right costovertebral junction is stable (the latter up to 22 millimeters). There is early extension into the right T5 neural foramen but no associated neural impingement. T6 left inferior pedicle metastasis and posterior left 6th rib metastasis redemonstrated, the former with epidural extension into the left T6 neural foramen on series 29, image 13, stable. Left T7 vertebral body metastasis is stable without extraosseous extension. Bulky left side metastasis with ventral epidural and left T8 neural foraminal extension is stable measuring about 31 millimeters diameter. There  is associated left foraminal stenosis, stable. Possible small T9 posterior inferior  endplate metastasis is stable, and there are expansile bilateral posterior 9th rib metastases located near the costovertebral junctions (series 32, image 22). The larger is 24 millimeters. The T10 level appears spared. Superior endplate metastases at I34 and T12 are redemonstrated without extraosseous extension. There is a left 12 costovertebral junction metastasis, stable (series 33, image 33). Cord: Capacious spinal canal. Spinal cord signal is within normal limits at all visualized levels. No abnormal intradural enhancement. No dural thickening. Paraspinal and other soft tissues: Small pleural effusions. Disc levels: No thoracic spinal stenosis despite ventral epidural tumor at T4 and T8. There is stable neural foraminal impingement at the left T6 and T8 levels. MRI LUMBAR SPINE FINDINGS Segmentation:  Normal. Alignment:  Stable mild straightening of lumbar lordosis. Vertebrae: Multilevel lumbar and partially visible sac, T2, ral and pelvic metastases. L1 posterosuperior endplate, right side vertebral body, and right inferior articulating facet metastases re-demonstrated. There is early ventral epidural extension of tumor with the former (series 35, image 10) which has increased since 07/07/2018. The facet metastasis has also increased. Small L2 superior endplate metastasis is stable without extraosseous extension. Three L3 vertebral body metastases, with mildly enlarged involvement at the anterior superior endplate since 74/25/9563, and early extension into the inferior left L3 neural foramen which is stable (series 38, image 16). Previous left L4 laminectomy. A small right L4 vertebral metastasis is minimally increased on series 37, image 5. A midline posterosuperior endplate L5 metastasis with early ventral epidural extension also appears slightly larger on series 38, image 23. At that same level a bulky metastasis in the left iliac wing with extraosseous extension has increased from 31-45 millimeters over the  same time. 24 millimeter left ventral S1 metastasis with early extraosseous extension into the presacral space has also mildly increased. There is no sacral metastasis related neural impingement identified. Conus medullaris: Extends to the T12-L1 level and appears normal. No lower spinal cord or conus signal abnormality. Normal cauda equina nerve roots. No abnormal intradural enhancement or dural thickening. Paraspinal and other soft tissues: Stable visualized abdominal viscera. Disc levels: No lumbar spinal stenosis or malignant neural impingement despite early epidural and foraminal involvement at several levels. IMPRESSION: 1. 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 Epidural and/or neural foraminal extension of tumor at the C3, T4, T6, T8, L1, L3, and L5. But no malignant spinal stenosis or cord compression. Multiple metastases throughout the spine are slightly larger since the MRIs last month, but there is no pathologic vertebral fracture. 2. No intradural metastasis. 3. A large medial left iliac wing metastasis with extraosseous extension of tumor has substantially increased since 07/07/18 (45 mm now versus 31 mm previously). Electronically Signed   By: Genevie Ann M.D.   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 EXAM: MRI TOTAL SPINE WITHOUT AND WITH CONTRAST TECHNIQUE: Multisequence MR imaging of the spine from the cervical spine to the sacrum was performed prior to and following IV contrast administration for evaluation of spinal metastatic disease. CONTRAST:  8 milliliters Gadavist COMPARISON:  Cervical and thoracic MRI 07/16/2018. Lumbar MRI 07/07/2018. FINDINGS: MRI CERVICAL SPINE FINDINGS Alignment: Stable cervical lordosis. Vertebrae: C3 vertebral body metastasis redemonstrated and stable in size, approximately 13 millimeters diameter. No epidural or extraosseous extension.  There is a subtle C2 inferior endplate metastasis (series 6, image 7) with no extraosseous  extension, increased. No other cervical spine metastasis; mild degenerative marrow edema suspected in the right C3 inferior articulating facet is also stable. No metastasis at the visible skull base. Cord: Spinal cord signal is within normal limits at all visualized levels. No abnormal intradural enhancement. No dural thickening. Posterior Fossa, vertebral arteries, paraspinal tissues: Cervicomedullary junction is within normal limits. Negative visible posterior fossa. Preserved major vascular flow voids in the neck. The right vertebral artery appears mildly dominant. Disc levels: Stable degenerative changes with spinal stenosis at C3-C4, C5-C6 and C6-C7 again noted. Degenerative cervical neural foraminal stenosis as detailed last month. MRI THORACIC SPINE FINDINGS Segmentation:  Normal. Alignment:  Stable thoracic kyphosis. Vertebrae: Widespread thoracic metastases. A small T2 metastasis is redemonstrated in the midline lamina, and demonstrates early dorsal epidural space extension on series 7, image 7 which appears increased. Posterior vertebral body T4 metastasis with epidural extension to the right of midline measures about 19 millimeters diameter and has not significantly changed. There is no associated spinal stenosis, although there is early epidural involvement of the right T4 neural foramen on series 30, image 6, stable. Metastasis in the T5 spinous process and enlarging the right costovertebral junction is stable (the latter up to 22 millimeters). There is early extension into the right T5 neural foramen but no associated neural impingement. T6 left inferior pedicle metastasis and posterior left 6th rib metastasis redemonstrated, the former with epidural extension into the left T6 neural foramen on series 29, image 13, stable. Left T7 vertebral body metastasis is stable without extraosseous extension. Bulky left side  metastasis with ventral epidural and left T8 neural foraminal extension is stable measuring about 31 millimeters diameter. There is associated left foraminal stenosis, stable. Possible small T9 posterior inferior endplate metastasis is stable, and there are expansile bilateral posterior 9th rib metastases located near the costovertebral junctions (series 32, image 22). The larger is 24 millimeters. The T10 level appears spared. Superior endplate metastases at Q67 and T12 are redemonstrated without extraosseous extension. There is a left 12 costovertebral junction metastasis, stable (series 33, image 33). Cord: Capacious spinal canal. Spinal cord signal is within normal limits at all visualized levels. No abnormal intradural enhancement. No dural thickening. Paraspinal and other soft tissues: Small pleural effusions. Disc levels: No thoracic spinal stenosis despite ventral epidural tumor at T4 and T8. There is stable neural foraminal impingement at the left T6 and T8 levels. MRI LUMBAR SPINE FINDINGS Segmentation:  Normal. Alignment:  Stable mild straightening of lumbar lordosis. Vertebrae: Multilevel lumbar and partially visible sac, T2, ral and pelvic metastases. L1 posterosuperior endplate, right side vertebral body, and right inferior articulating facet metastases re-demonstrated. There is early ventral epidural extension of tumor with the former (series 35, image 10) which has increased since 07/07/2018. The facet metastasis has also increased. Small L2 superior endplate metastasis is stable without extraosseous extension. Three L3 vertebral body metastases, with mildly enlarged involvement at the anterior superior endplate since 61/95/0932, and early extension into the inferior left L3 neural foramen which is stable (series 38, image 16). Previous left L4 laminectomy. A small right L4 vertebral metastasis is minimally increased on series 37, image 5. A midline posterosuperior endplate L5 metastasis with early  ventral epidural extension also appears slightly larger on series 38, image 23. At that same level a bulky metastasis in the left iliac wing with extraosseous extension has increased from 31-45 millimeters over the same time. 24 millimeter left ventral S1 metastasis with early extraosseous extension into the presacral space has  also mildly increased. There is no sacral metastasis related neural impingement identified. Conus medullaris: Extends to the T12-L1 level and appears normal. No lower spinal cord or conus signal abnormality. Normal cauda equina nerve roots. No abnormal intradural enhancement or dural thickening. Paraspinal and other soft tissues: Stable visualized abdominal viscera. Disc levels: No lumbar spinal stenosis or malignant neural impingement despite early epidural and foraminal involvement at several levels. IMPRESSION: 1. 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 Epidural and/or neural foraminal extension of tumor at the C3, T4, T6, T8, L1, L3, and L5. But no malignant spinal stenosis or cord compression. Multiple metastases throughout the spine are slightly larger since the MRIs last month, but there is no pathologic vertebral fracture. 2. No intradural metastasis. 3. A large medial left iliac wing metastasis with extraosseous extension of tumor has substantially increased since 07/07/18 (45 mm now versus 31 mm previously). Electronically Signed   By: Genevie Ann M.D.   On: 07/20/2018 21:16   Mr Lumbar Spine W Wo Contrast  Result Date: 07/07/2018 CLINICAL DATA:  History of prostate cancer and bladder cancer. Back pain EXAM: MRI LUMBAR SPINE WITHOUT AND WITH CONTRAST TECHNIQUE: Multiplanar and multiecho pulse sequences of the lumbar spine were obtained without and with intravenous contrast. CONTRAST:  8 mL Gadovist IV COMPARISON:  CT abdomen pelvis 07/05/2018 FINDINGS: Segmentation:  Normal Alignment:  Normal Vertebrae: Multiple enhancing bone marrow lesions are seen  compatible with metastatic disease throughout the lumbar spine as well as in the lower thoracic spine and the sacrum. Metastatic deposits are seen throughout the vertebral bodies from T11 through S1. 3 cm mass in the left iliac bone. No epidural tumor. Conus medullaris and cauda equina: Conus extends to the L1-2 level. Conus and cauda equina appear normal. Paraspinal and other soft tissues: Bilateral renal cysts. No retroperitoneal mass or adenopathy. Disc levels: L1-2: Negative L2-3: Mild disc bulging L3-4: Prior laminectomy on the left. Small left paracentral disc protrusion with mild subarticular stenosis on the left L4-5: Prior laminectomy on the left. Broad-based central disc protrusion with associated endplate spurring. Mild subarticular stenosis bilaterally L5-S1: Moderate disc degeneration and spurring. Subarticular stenosis bilaterally due to spurring. Right laminectomy. IMPRESSION: Widespread metastatic disease throughout the lower thoracic spine, tire lumbar spine, and sacrum. 3 cm expansile mass left iliac bone. No pathologic fracture Lower lumbar degenerative change as above. Prior laminectomy at L3-4, L4-5, L5-S1. Electronically Signed   By: Franchot Gallo M.D.   On: 07/07/2018 14:45   Ct Abdomen Pelvis W Contrast  Result Date: 07/20/2018 CLINICAL DATA:  Back pain. EXAM: CT ABDOMEN AND PELVIS WITH CONTRAST TECHNIQUE: Multidetector CT imaging of the abdomen and pelvis was performed using the standard protocol following bolus administration of intravenous contrast. CONTRAST:  161mL OMNIPAQUE IOHEXOL 300 MG/ML  SOLN COMPARISON:  CT scan of July 05, 2018.  MRI of July 07, 2018. FINDINGS: Lower chest: Mild bilateral posterior basilar subsegmental atelectasis is noted. Hepatobiliary: No gallstones or biliary dilatation is noted. Multiple small rounded hypoechoic lesions are seen throughout hepatic parenchyma concerning for metastatic disease. The largest measures 13 mm. Pancreas: Unremarkable. No  pancreatic ductal dilatation or surrounding inflammatory changes. Spleen: Normal in size without focal abnormality. Adrenals/Urinary Tract: Adrenal glands appear normal. Bilateral renal cysts are noted. No hydronephrosis or renal obstruction is noted. No renal or ureteral calculi are noted. The bladder is very irregular in contour which may be postsurgical in etiology. Stomach/Bowel: The stomach appears normal. There is no evidence of bowel obstruction or  inflammation. Stool is noted throughout the colon. The appendix appears normal. Vascular/Lymphatic: No significant vascular findings are present. No enlarged abdominal or pelvic lymph nodes. Reproductive: No definite mass is noted. Other: No abdominal wall hernia or abnormality. No abdominopelvic ascites. Musculoskeletal: Multiple lytic metastatic lesions are noted in the visualized left ribs, spine and pelvis. The largest lesion is noted in the T8 vertebral body and measures 2.9 cm. It extends slightly posteriorly into the posterior spinal canal. IMPRESSION: Multiple small hepatic metastatic lesions are noted. Multiple lytic metastatic lesions are noted in the visualized ribs, spine and pelvis. The bladder is irregular in contour which most likely is postsurgical in etiology. Stool is noted throughout the colon. Electronically Signed   By: Marijo Conception M.D.   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 Patient has bone and liver lesions. Patient has a destructive lytic lesion involving the left iliac bone. EXAM: CT-GUIDED BIOPSY OF LEFT ILIAC BONE LESION MEDICATIONS: None. ANESTHESIA/SEDATION: Moderate (conscious) sedation was employed during this procedure. A total of Versed 2.0 mg and Fentanyl 100 mcg was administered intravenously. Moderate Sedation Time: 15 minutes. The patient's level of consciousness and vital signs were monitored continuously by radiology nursing  throughout the procedure under my direct supervision. FLUOROSCOPY TIME:  None COMPLICATIONS: None immediate. PROCEDURE: Informed written consent was obtained from the patient after a thorough discussion of the procedural risks, benefits and alternatives. All questions were addressed. A timeout was performed prior to the initiation of the procedure. Patient was placed on his right side. CT images through the pelvis were obtained. The destructive lesion involving the left iliac bone was identified. Left side of the pelvis was prepped with chlorhexidine and sterile field was created. Skin and soft tissues were anesthetized with 1% lidocaine. Using CT guidance, 17 gauge coaxial needle was directed along the posterior aspect of the lesion. Total of 4 core biopsies were obtained with an 18 gauge device. Specimens placed in formalin. Needle was removed without complication. Bandage placed over the puncture site. FINDINGS: Again noted is large lytic lesion involving the left ilium with destruction of bone. Core biopsies were obtained from the soft tissue component of the lesion. IMPRESSION: CT-guided core biopsies of the left iliac bone lesion. Electronically Signed   By: Markus Daft M.D.   On: 07/22/2018 14:31   Dg Chest Port 1 View  Result Date: 07/20/2018 CLINICAL DATA:  Chest pain.  Metastatic bladder and prostate cancer. EXAM: PORTABLE CHEST 1 VIEW COMPARISON:  Chest x-ray dated November 12, 2017. FINDINGS: The heart size and mediastinal contours are within normal limits. Normal pulmonary vascularity. Low lung volumes with mild right basilar atelectasis. No acute osseous abnormality. No focal consolidation, pleural effusion, or pneumothorax. IMPRESSION: Mild right basilar atelectasis.  No active disease. Electronically Signed   By: Titus Dubin M.D.   On: 07/20/2018 11:31   Ct Renal Stone Study  Result Date: 07/05/2018 CLINICAL DATA:  History of prostate cancer, bladder cancer, postop. Flank pain. EXAM: CT  ABDOMEN AND PELVIS WITHOUT CONTRAST TECHNIQUE: Multidetector CT imaging of the abdomen and pelvis was performed following the standard protocol without IV contrast. COMPARISON:  PET CT 09/25/2017.  CT abdomen and pelvis 06/18/2017. FINDINGS: Lower chest: Mild wall thickening in the lingula with airspace opacity, likely atelectasis. No effusions. Heart is normal size. Hepatobiliary: No focal hepatic abnormality. Gallbladder unremarkable. Pancreas: No focal abnormality or ductal dilatation. Spleen: No focal abnormality.  Normal size. Adrenals/Urinary  Tract: Multiple bilateral renal cysts. No hydronephrosis. No renal or ureteral stones. Adrenal glands unremarkable. Foley catheter present in the neobladder which is grossly unremarkable. Stomach/Bowel: Postoperative changes in the right lower quadrant. Moderate stool burden in the colon. No evidence of a bowel obstruction. Vascular/Lymphatic: Aortic atherosclerosis. No enlarged abdominal or pelvic lymph nodes. Reproductive: No visible focal abnormality. Other: No free fluid or free air. Musculoskeletal: Postoperative changes in the right pelvis. No acute bony abnormality. IMPRESSION: Foley catheter in decompressed neobladder.  No hydronephrosis. Bilateral renal cysts are stable since prior study. Moderate stool in the colon. Aortic atherosclerosis. No acute findings. Electronically Signed   By: Rolm Baptise M.D.   On: 07/05/2018 00:49       IMPRESSION/PLAN: 1. Progressive metastatic carcinoma most consistent with either recurrent urothelial carcinoma or adenocarcinoma of the prostate.  The patient has undergone a biopsy of the left iliac wing lesion.  Pathology is pending at the time of dictation.  We will follow-up with these results as available.  We discussed the findings from his previous course that began in [place] as well as have discussed his case with his radiation oncologist and evening.  We would like to offer the patient a course of palliative radiotherapy  and he is interested in completing this here in [place] knowing that he would need to complete the course in the same location it begins.  Dr. [name] would offer treatment to the same sites, C2-4 as well as T2-10, in addition to the left iliac wing.  He does have disease that is also of concern in the lumbar spine which could lead to the need for further palliative treatment.  We discussed the risks, benefits, short and long-term effects of radiotherapy, the patient is interested in completing the 8 remaining fractions to the C and T-spine, as well as 10 fractions to the left iliac wing.  He understands that it could take up to 2 weeks or so in order to see the benefit clinically.  Written consent was obtained from the patient, and he will proceed with simulation today.  We anticipate beginning his treatment tomorrow.  He will also continue to follow-up with Dr. [name] as an outpatient following completion of palliative radiotherapy and once his biopsy results are available. 2. Pain secondary to #1.  The patient continues to be evaluated by palliative care and we appreciate their input regarding his pain management.  Hopefully we can get him some relief soon so we can discharge and continue treatment as an outpatient.  In a visit lasting 70 minutes, greater than 50% of the time was spent face to face discussing his case, and in floor time coordinating the patient's care.    Carola Rhine, PAC

## 2018-07-24 ENCOUNTER — Ambulatory Visit
Admit: 2018-07-24 | Discharge: 2018-07-24 | Disposition: A | Discharge status: Home / Self Care | Payer: PRIVATE HEALTH INSURANCE | Attending: Radiation Oncology | Admitting: Radiation Oncology

## 2018-07-24 LAB — COMPREHENSIVE METABOLIC PANEL
ALT: 16 U/L (ref 0–44)
AST: 14 U/L — ABNORMAL LOW (ref 15–41)
Albumin: 2.7 g/dL — ABNORMAL LOW (ref 3.5–5.0)
Alkaline Phosphatase: 107 U/L (ref 38–126)
Anion gap: 7 (ref 5–15)
BUN: 22 mg/dL — ABNORMAL HIGH (ref 6–20)
CO2: 24 mmol/L (ref 22–32)
Calcium: 10.1 mg/dL (ref 8.9–10.3)
Chloride: 109 mmol/L (ref 98–111)
Creatinine, Ser: 0.63 mg/dL (ref 0.61–1.24)
GFR calc Af Amer: >60 mL/min (ref >60)
GFR calc non Af Amer: >60 mL/min (ref >60)
Glucose, Bld: 104 mg/dL — ABNORMAL HIGH (ref 70–99)
Potassium: 4.3 mmol/L (ref 3.5–5.1)
Sodium: 140 mmol/L (ref 135–145)
Total Bilirubin: 0.3 mg/dL (ref 0.3–1.2)
Total Protein: 6.7 g/dL (ref 6.5–8.1)

## 2018-07-24 LAB — CBC
HCT: 40.2 % (ref 39.0–52.0)
Hemoglobin: 12.2 g/dL — ABNORMAL LOW (ref 13.0–17.0)
MCH: 31.2 pg (ref 26.0–34.0)
MCHC: 30.3 g/dL (ref 30.0–36.0)
MCV: 102.8 fL — ABNORMAL HIGH (ref 80.0–100.0)
Platelets: 327 10*3/uL (ref 150–400)
RBC: 3.91 MIL/uL — ABNORMAL LOW (ref 4.22–5.81)
RDW: 11.9 % (ref 11.5–15.5)
WBC: 10.3 10*3/uL (ref 4.0–10.5)
nRBC: 0 % (ref 0.0–0.2)

## 2018-07-24 MED ORDER — ENSURE ENLIVE PO LIQD
237.0000 mL | Freq: Two times a day (BID) | ORAL | Status: DC
  Administered 2018-07-25 – 2018-07-29 (×5): 237 mL via ORAL

## 2018-07-24 MED ORDER — SORBITOL 70 % SOLN
960.0000 mL | TOPICAL_OIL | Freq: Once | ORAL | Status: AC
  Administered 2018-07-24: 960 mL via RECTAL
  Filled 2018-07-24: qty 473

## 2018-07-24 MED ORDER — LACTULOSE 10 GM/15ML PO SOLN
30.0000 g | Freq: Every day | ORAL | Status: DC
  Administered 2018-07-24 – 2018-07-28 (×5): 30 g via ORAL
  Filled 2018-07-24 (×5): qty 45

## 2018-07-24 MED ORDER — HYDROMORPHONE HCL 1 MG/ML IJ SOLN
1.0000 mg | INTRAMUSCULAR | Status: AC
  Administered 2018-07-24: 1 mg via INTRAVENOUS
  Filled 2018-07-24: qty 1

## 2018-07-24 MED ORDER — HYDROMORPHONE HCL 2 MG PO TABS
1.0000 mg | ORAL_TABLET | ORAL | Status: DC | PRN
  Administered 2018-07-24 – 2018-07-25 (×3): 1 mg via ORAL
  Filled 2018-07-24 (×4): qty 1

## 2018-07-24 MED ORDER — BOOST / RESOURCE BREEZE PO LIQD CUSTOM
1.0000 | ORAL | Status: DC
  Administered 2018-07-27 – 2018-07-29 (×3): 1 via ORAL

## 2018-07-24 NOTE — Progress Notes (Signed)
I spoke with the patient by phone this am to let him know that pathology is still pending but favors urothelial carcinoma in the iliac bone biopsy.

## 2018-07-24 NOTE — Progress Notes (Signed)
Palliative:  HPI: 48 yo male with PMH significant of bladder tumor, prostate cancer s/p robotic assisted cystoprostatectomy, bradycardia, anxiety, depression admitted 07/20/18 with worsening back pain without any trauma/falls with plans for biopsy of left iliac crest for 07/23/18. LBM 6 days PTA and no relief of pain with use of home opioids, steroids, and muscle relaxer. Hospitalization has been complicated by continued poorly controlled pain. Pain slowly improving with adjustments. Beginning radiation therapy and hoping this improves pain as well.   I met again today with Rodney [name]. Rodney [name]'s pain is still present but improved from yesterday. He reports that he was finally able to get some sleep last night. I am also hopeful that the increased fentanyl patch will continue to work better for him and we can consider increasing again tomorrow if needed.   Rodney [name] speaks to me further about his belief in God and finding strength through his spiritual beliefs. He believes that God has a plan for him and does not feel that his cancer or pain is any source of punishment from God. He feels there is a reason for everything he is going through. He also acknowledges that whatever the future holds he knows he will be okay because he has God on his side. He follows this up with the fact that he believes he will be healed and that he believes he will live several more years.   All questions/concerns addressed. Emotional support provided.   Exam: Alert, oriented. Less distressed with improved pain. Less tense and is smiling today. Improved spirits. Breathing regular, unlabored. Abd distended and tender.   Recommendations/Plan:  Acute on chronic RUQ pain with severe radiation to back and across upper abdomen:  ? Fentanyl patch 75 mcg/hr. ? IV dilaudid 1 mg every 3 hours prn. Gave orders for early dose for RN so he will be able to receive a dose prior to radiation treatment today.  ? Continue decadron.  ? Flexeril 10 mg po  TID prn.  ? Consider addition of neuropathic adjunctive such as gabapentin or Lyrica possibly in combination with Cymbalta. Plan to discuss this addition with patient tomorrow. We were interrupted by telephone call today.   Constipation:  ? SMOG enema x 1 today (never received this yesterday). This worked for him 7/5 for LBM.  ? Daily lactulose.  ? Goal is BM daily.    [phone]  Rodney Shanker, NP Palliative Medicine Team Pager [phone] (Please see [email or web address] for schedule) Team Phone [phone]    Greater than 50%  of this time was spent counseling and coordinating care related to the above assessment and plan

## 2018-07-24 NOTE — Progress Notes (Signed)
   07/24/18 1000  Clinical Encounter Type  Visited With Patient  Visit Type Initial;Psychological support;Spiritual support  Referral From Nurse;Palliative care team  Consult/Referral To Chaplain  Spiritual Encounters  Spiritual Needs Emotional;Other (Comment) (Spiritual Care Conversation/Support)  Stress Factors  Patient Stress Factors Health changes;Major life changes   I spoke with Rodney [name] per spiritual care consult. I provided prayer and a listening presence. Rodney [name] states that "everything is good," but acknowledged that he has pain and wanted me to pray for "healing and pain relief." He believes on the power of prayer and believes that God is going to heal him. Rodney [name] is wanting to be healed and go home.  He appreciated the support.  Please, contact Spiritual Care for further assistance.   Chaplain Shanon Ace M.Div., [facility]

## 2018-07-24 NOTE — Progress Notes (Signed)
PROGRESS NOTE    Rodney [name]  QQV:[id number] DOB: 1970-10-21 DOA: 07/20/2018 PCP: Curlene Labrum, MD    Brief Narrative:  48 year old gentleman with history of bladder tumor, prostate cancer status post robotic assisted cystoprostatectomy, anxiety and depression who presented to the hospital with severe pain in his back over last week.  Patient recently had worsening pain on his hip and was found to have left iliac crest lesion.  On further imaging studies, he was found to have diffuse progression of bony mets throughout the spine, large medial left iliac wing metastasis with extraosseous extension of tumor.  Patient is admitted to the hospital because of severe pain, for radiation treatment and pain management.   Assessment & Plan:   Principal Problem:   Intractable pain Active Problems:   Malignant neoplasm of prostate (HCC)   Spine metastasis (HCC)   Constipation   Acute lower UTI   Leukocytosis   Metastatic cancer [facility])   Palliative care encounter  Metastatic urothelial cancer: With severe cancer pain.  Followed by palliative care for pain control.  Patient transferred to [facility] long for starting radiation therapy.  Status post bone marrow biopsy on 07/22/2018 from his left iliac crest. Currently remains on fentanyl patch 75 mcg/h, Decadron and IV Dilaudid.  Appreciate palliative care help. Continue with stool softener and laxatives. Followed by oncology and radiation therapy.  Plan for radiation treatment today.   Abnormal urinalysis: Patient was started on Rocephin on 07/20/2018 with abnormal urine.  Unfortunately urine culture was not sent.  Does not have any urinary symptoms today.  Will finish 5 days of IV therapy and stop.  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  DVT prophylaxis: Lovenox subcu Code Status: Full code Family Communication: None Disposition Plan: Home after hospitalization   Consultants:   Oncology  Radiation oncology   Procedures:   Bone marrow biopsy left iliac crest, 07/22/2018  Antimicrobials:   Rocephin, 07/20/2018--   Subjective: Patient was seen and examined in the morning rounds.  Patient stated that he was able to get some sleep last night after 5 days.  Still has intermittent pain, however slightly better than before with change in pain medication regimen.  He has not had a bowel movement for last 3 days and was just taking MiraLAX.  Objective: Vitals:   07/23/18 2035 07/24/18 0454 07/24/18 1342 07/24/18 1404  BP: (!) 137/96 (!) 132/94 (!) 126/97   Pulse: 94 91 (!) 103   Resp: 20 (!) 21 18   Temp: (!) 97.4 F (36.3 C) (!) 97.5 F (36.4 C) 98.1 F (36.7 C)   TempSrc: Oral Oral Oral   SpO2: 97% 95% 97% 92%  Weight:      Height:        Intake/Output Summary (Last 24 hours) at 07/24/2018 1420 Last data filed at 07/24/2018 1230 Gross per 24 hour  Intake 5293.54 ml  Output 5050 ml  Net 243.54 ml   Filed Weights   07/20/18 1019  Weight: 83.5 kg    Examination:  General exam: Appears calm and comfortable, chronically sick looking.  In mild distress and anxious. Respiratory system: Clear to auscultation. Respiratory effort normal. Cardiovascular system: S1 & S2 heard, RRR. No JVD, murmurs, rubs, gallops or clicks. No pedal edema. Gastrointestinal system: Abdomen is nondistended, soft and nontender. No organomegaly or masses felt. Normal bowel sounds heard. Central nervous system: Alert and oriented. No focal neurological deficits. Extremities: Symmetric 5 x 5 power. Skin: No rashes, lesions or  ulcers Psychiatry: Judgement and insight appear normal. Mood & affect anxious.    Data Reviewed: I have personally reviewed following labs and imaging studies  CBC: Recent Labs  Lab 07/20/18 1053 07/21/18 0743 07/24/18 0630  WBC 12.2* 12.2* 10.3  NEUTROABS 10.1*  --   --   HGB 13.1 13.1 12.2*  HCT 41.5 41.5 40.2  MCV 100.2* 99.3 102.8*  PLT 388 384 902   Basic Metabolic Panel:  Recent Labs  Lab 07/20/18 1053 07/21/18 0743 07/22/18 0712 07/24/18 0630  NA 135 135 134* 140  K 4.3 5.2* 4.8 4.3  CL 96* 96* 99 109  CO2 26 28 26 24   GLUCOSE 129* 97 117* 104*  BUN 25* 23* 23* 22*  CREATININE 0.93 1.05 0.78 0.63  CALCIUM 10.4* 10.7* 10.2 10.1   GFR: Estimated Creatinine Clearance: 128.4 mL/min (by C-G formula based on SCr of 0.63 mg/dL). Liver Function Tests: Recent Labs  Lab 07/20/18 1527 07/22/18 0712 07/24/18 0630  AST 14* 17 14*  ALT 19 16 16   ALKPHOS 129* 110 107  BILITOT 0.6 0.6 0.3  PROT 6.9 6.7 6.7  ALBUMIN 2.8* 2.4* 2.7*   Recent Labs  Lab 07/20/18 1527  LIPASE 20   No results for input(s): AMMONIA in the last 168 hours. Coagulation Profile: Recent Labs  Lab 07/20/18 1053  INR 1.1   Cardiac Enzymes: No results for input(s): CKTOTAL, CKMB, CKMBINDEX, TROPONINI in the last 168 hours. BNP (last 3 results) No results for input(s): PROBNP in the last 8760 hours. HbA1C: No results for input(s): HGBA1C in the last 72 hours. CBG: No results for input(s): GLUCAP in the last 168 hours. Lipid Profile: No results for input(s): CHOL, HDL, LDLCALC, TRIG, CHOLHDL, LDLDIRECT in the last 72 hours. Thyroid Function Tests: No results for input(s): TSH, T4TOTAL, FREET4, T3FREE, THYROIDAB in the last 72 hours. Anemia Panel: No results for input(s): VITAMINB12, FOLATE, FERRITIN, TIBC, IRON, RETICCTPCT in the last 72 hours. Sepsis Labs: Recent Labs  Lab 07/21/18 0039  LATICACIDVEN 1.0    Recent Results (from the past 240 hour(s))  SARS Coronavirus 2 (CEPHEID - Performed in [facility] hospital lab), Hosp Order     Status: None   Collection Time: 07/20/18 11:06 AM   Specimen: Nasopharyngeal Swab  Result Value Ref Range Status   SARS Coronavirus 2 NEGATIVE NEGATIVE Final    Comment: (NOTE) If result is NEGATIVE SARS-CoV-2 target nucleic acids are NOT DETECTED. The SARS-CoV-2 RNA is generally detectable in upper and lower  respiratory  specimens during the acute phase of infection. The lowest  concentration of SARS-CoV-2 viral copies this assay can detect is 250  copies / mL. A negative result does not preclude SARS-CoV-2 infection  and should not be used as the sole basis for treatment or other  patient management decisions.  A negative result may occur with  improper specimen collection / handling, submission of specimen other  than nasopharyngeal swab, presence of viral mutation(s) within the  areas targeted by this assay, and inadequate number of viral copies  (<250 copies / mL). A negative result must be combined with clinical  observations, patient history, and epidemiological information. If result is POSITIVE SARS-CoV-2 target nucleic acids are DETECTED. The SARS-CoV-2 RNA is generally detectable in upper and lower  respiratory specimens dur ing the acute phase of infection.  Positive  results are indicative of active infection with SARS-CoV-2.  Clinical  correlation with patient history and other diagnostic information is  necessary to determine patient infection  status.  Positive results do  not rule out bacterial infection or co-infection with other viruses. If result is PRESUMPTIVE POSTIVE SARS-CoV-2 nucleic acids MAY BE PRESENT.   A presumptive positive result was obtained on the submitted specimen  and confirmed on repeat testing.  While 2019 novel coronavirus  (SARS-CoV-2) nucleic acids may be present in the submitted sample  additional confirmatory testing may be necessary for epidemiological  and / or clinical management purposes  to differentiate between  SARS-CoV-2 and other Sarbecovirus currently known to infect humans.  If clinically indicated additional testing with an alternate test  methodology [phone]) is advised. The SARS-CoV-2 RNA is generally  detectable in upper and lower respiratory sp ecimens during the acute  phase of infection. The expected result is Negative. Fact Sheet for  Patients:  StrictlyIdeas.no Fact Sheet for Healthcare Providers: BankingDealers.co.za This test is not yet approved or cleared by the [place] FDA and has been authorized for detection and/or diagnosis of SARS-CoV-2 by FDA under an Emergency Use Authorization (EUA).  This EUA will remain in effect (meaning this test can be used) for the duration of the COVID-19 declaration under Section 564(b)(1) of the Act, 21 U.S.C. section 360bbb-3(b)(1), unless the authorization is terminated or revoked sooner. Performed at [facility], [address] [place], [place] [address]          Radiology Studies: No results found.      Scheduled Meds: . dexamethasone (DECADRON) injection  4 mg Intravenous BID  . enoxaparin (LOVENOX) injection  40 mg Subcutaneous Daily  . feeding supplement  1 Container Oral TID BM  . fentaNYL  1 patch Transdermal Q72H  . lactulose  30 g Oral Daily  . polyethylene glycol  17 g Oral Daily  . sodium chloride flush  3 mL Intravenous Q12H  . sorbitol, milk of mag, mineral oil, glycerin (SMOG) enema  960 mL Rectal Once  . sorbitol, milk of mag, mineral oil, glycerin (SMOG) enema  960 mL Rectal Once   Continuous Infusions: . sodium chloride 100 mL/hr at 07/24/18 0122  . cefTRIAXone (ROCEPHIN)  IV 1 g (07/23/18 2145)     LOS: 3 days    Time spent: 25 minutes     Barb Merino, MD [facility] Pager [phone]  If 7PM-7AM, please contact night-coverage [email or web address] Password [email or web address] 07/24/2018, 2:20 PM

## 2018-07-24 NOTE — Progress Notes (Signed)
Initial Nutrition Assessment  INTERVENTION:   -Provide Ensure Enlive po BID, each supplement provides 350 kcal and 20 grams of protein -Boost Breeze po daily, each supplement provides 250 kcal and 9 grams of protein  NUTRITION DIAGNOSIS:   Increased nutrient needs related to cancer and cancer related treatments as evidenced by estimated needs.  GOAL:   Patient will meet greater than or equal to 90% of their needs  MONITOR:   PO intake, Supplement acceptance, Labs, Weight trends, I & O's  REASON FOR ASSESSMENT:   Malnutrition Screening Tool    ASSESSMENT:   48 y.o. male with medical history significant of bladder tumor, prostate cancer s/p robotic assisted cystoprostatectomy, anxiety, and depression; who presents with complaints of severe pain in his back over the last week. 7/6: s/p CT guided biopsy of left iliac bone lesion  **RD working remotely**  Patient reports poor appetite related to increased pain. Pt has been consuming 25-75% of meals at this time. Breakfast this morning provided ~535 kcal and 16g protein and lunch provided ~310 kcal and 9g protein. Pt has accepted 1 Boost Breeze supplement today so far. Will order Ensure supplements for more kcal and protein. Pt with increased needs now that he has started radiation therapy recently.   Per weight records, pt has lost 21 lbs since October 2019, insignificant for time frame.   Labs reviewed. Medications reviewed.  NUTRITION - FOCUSED PHYSICAL EXAM:  Unable to perform -working remotely.  Diet Order:   Diet Order            Diet regular Room service appropriate? Yes with Assist; Fluid consistency: Thin  Diet effective now              EDUCATION NEEDS:   Not appropriate for education at this time  Skin:  Skin Assessment: Reviewed RN Assessment  Last BM:  7/5  Height:   Ht Readings from Last 1 Encounters:  07/20/18 6\' 2"  (1.88 m)    Weight:   Wt Readings from Last 1 Encounters:  07/20/18 83.5 kg     Ideal Body Weight:  86.3 kg  BMI:  Body mass index is 23.62 kg/m.  Estimated Nutritional Needs:   Kcal:  7741-2878  Protein:  120-130g  Fluid:  2.3L/day  Clayton Bibles, MS, RD, LDN [place] [facility] Pager: [phone] After Hours Pager: [phone]

## 2018-07-24 NOTE — Progress Notes (Signed)
Pt c/o needing pain meds but doesn't like that it makes him so sleepy....doesnt want to feel like he's "sleeping through the pain" Palliative team notified. Orders received.

## 2018-07-25 ENCOUNTER — Ambulatory Visit
Admit: 2018-07-25 | Discharge: 2018-07-25 | Disposition: A | Discharge status: Home / Self Care | Payer: PRIVATE HEALTH INSURANCE | Attending: Radiation Oncology | Admitting: Radiation Oncology

## 2018-07-25 LAB — COMPREHENSIVE METABOLIC PANEL
ALT: 17 U/L (ref 0–44)
AST: 14 U/L — ABNORMAL LOW (ref 15–41)
Albumin: 2.7 g/dL — ABNORMAL LOW (ref 3.5–5.0)
Alkaline Phosphatase: 105 U/L (ref 38–126)
Anion gap: 9 (ref 5–15)
BUN: 16 mg/dL (ref 6–20)
CO2: 25 mmol/L (ref 22–32)
Calcium: 9.9 mg/dL (ref 8.9–10.3)
Chloride: 105 mmol/L (ref 98–111)
Creatinine, Ser: 0.51 mg/dL — ABNORMAL LOW (ref 0.61–1.24)
GFR calc Af Amer: >60 mL/min (ref >60)
GFR calc non Af Amer: >60 mL/min (ref >60)
Glucose, Bld: 96 mg/dL (ref 70–99)
Potassium: 4.4 mmol/L (ref 3.5–5.1)
Sodium: 139 mmol/L (ref 135–145)
Total Bilirubin: 0.5 mg/dL (ref 0.3–1.2)
Total Protein: 6.9 g/dL (ref 6.5–8.1)

## 2018-07-25 LAB — CBC
HCT: 39.5 % (ref 39.0–52.0)
Hemoglobin: 12.2 g/dL — ABNORMAL LOW (ref 13.0–17.0)
MCH: 31.5 pg (ref 26.0–34.0)
MCHC: 30.9 g/dL (ref 30.0–36.0)
MCV: 102.1 fL — ABNORMAL HIGH (ref 80.0–100.0)
Platelets: 353 10*3/uL (ref 150–400)
RBC: 3.87 MIL/uL — ABNORMAL LOW (ref 4.22–5.81)
RDW: 12 % (ref 11.5–15.5)
WBC: 10.2 10*3/uL (ref 4.0–10.5)
nRBC: 0 % (ref 0.0–0.2)

## 2018-07-25 MED ORDER — HYDROMORPHONE HCL 1 MG/ML IJ SOLN: 1.0000 mg | Freq: Four times a day (QID) | INTRAMUSCULAR | Status: DC | PRN

## 2018-07-25 MED ORDER — HYDROMORPHONE HCL 2 MG PO TABS
1.0000 mg | ORAL_TABLET | ORAL | Status: DC | PRN
  Administered 2018-07-25 (×2): 1 mg via ORAL
  Filled 2018-07-25 (×2): qty 1

## 2018-07-25 MED ORDER — DULOXETINE HCL 30 MG PO CPEP
30.0000 mg | ORAL_CAPSULE | Freq: Every day | ORAL | Status: DC
  Administered 2018-07-25 – 2018-08-02 (×9): 30 mg via ORAL
  Filled 2018-07-25 (×9): qty 1

## 2018-07-25 MED ORDER — HYDROMORPHONE HCL 1 MG/ML IJ SOLN
0.5000 mg | Freq: Four times a day (QID) | INTRAMUSCULAR | Status: DC | PRN
  Administered 2018-07-26 – 2018-07-29 (×4): 0.5 mg via INTRAVENOUS
  Filled 2018-07-25 (×6): qty 0.5

## 2018-07-25 MED ORDER — BISACODYL 5 MG PO TBEC
5.0000 mg | DELAYED_RELEASE_TABLET | Freq: Every day | ORAL | Status: DC | PRN
  Administered 2018-07-25 – 2018-07-31 (×2): 5 mg via ORAL
  Filled 2018-07-25 (×2): qty 1

## 2018-07-25 MED ORDER — HYDROMORPHONE HCL 2 MG PO TABS
2.0000 mg | ORAL_TABLET | ORAL | Status: DC | PRN
  Administered 2018-07-25 – 2018-08-02 (×48): 2 mg via ORAL
  Filled 2018-07-25 (×50): qty 1

## 2018-07-25 MED ORDER — VITAMIN B-12 1000 MCG PO TABS
1000.0000 ug | ORAL_TABLET | Freq: Every day | ORAL | Status: DC
  Filled 2018-07-25: qty 1

## 2018-07-25 NOTE — Progress Notes (Signed)
PROGRESS NOTE    Rodney [name]  EUM:[id number] DOB: [birth year] DOA: 07/20/2018 PCP: Curlene Labrum, MD    Brief Narrative:  49 year old gentleman with history of bladder tumor, prostate cancer status post robotic assisted cystoprostatectomy, anxiety and depression who presented to the hospital with severe pain in his back over last week.  Patient recently had worsening pain on his hip and was found to have left iliac crest lesion.  On further imaging studies, he was found to have diffuse progression of bony mets throughout the spine, large medial left iliac wing metastasis with extraosseous extension of tumor.  Patient is admitted to the hospital because of severe pain, for radiation treatment and pain management.   Assessment & Plan:   Principal Problem:   Intractable pain Active Problems:   Malignant neoplasm of prostate (HCC)   Spine metastasis (HCC)   Constipation   Acute lower UTI   Leukocytosis   Metastatic cancer [facility])   Palliative care encounter  Metastatic urothelial cancer: With severe cancer pain.  Followed by palliative care for pain control.  Patient transferred to [facility] long for starting radiation therapy.  Status post bone marrow biopsy on 07/22/2018 from his left iliac crest. - Currently remains on fentanyl patch 75 mcg/h, Decadron and IV and oral Dilaudid.  Appreciate palliative care help. - Continue with stool softener and laxatives. - Followed by oncology and radiation therapy.  Patient is receiving radiation treatment.  Continue IV fluids.  Abnormal urinalysis: Patient was started on Rocephin on 07/20/2018 with abnormal urine.  Unfortunately urine culture was not sent.  Does not have any urinary symptoms today.  Discontinue antibiotics.  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  Continue on IV fluids.  DVT prophylaxis: Lovenox subcu Code Status: Full code Family Communication: None Disposition Plan: Home after hospitalization    Consultants:   Oncology  Radiation oncology  Procedures:   Bone marrow biopsy left iliac crest, 07/22/2018  Antimicrobials:   Rocephin, 07/20/2018--07/25/2018   Subjective: Patient was seen and examined in the morning rounds.  Continues to have intermittent severe pain.  He wanted to stay on more of the oral medicine but more frequently and use less frequent IV pain medications. Still has severe intolerable pain at times. Had good bowel movement with enema.  Objective: Vitals:   07/24/18 1342 07/24/18 1404 07/24/18 1956 07/25/18 0526  BP: (!) 126/97  (!) 139/93 110/90  Pulse: (!) 103  99 91  Resp: _0 Temp: 98.1 F (36.7 C)  98.4 F (36.9 C) 97.9 F (36.6 C)  TempSrc: Oral  Oral Oral  SpO2: 97% 92% 96% 98%  Weight:      Height:        Intake/Output Summary (Last 24 hours) at 07/25/2018 1145 Last data filed at 07/25/2018 0842 Gross per 24 hour  Intake 2847.26 ml  Output 1600 ml  Net 1247.26 ml   Filed Weights   07/20/18 1019  Weight: 83.5 kg    Examination:  General exam: Appears calm and comfortable, chronically sick looking.  In mild distress and anxious. Respiratory system: Clear to auscultation. Respiratory effort normal. Cardiovascular system: S1 & S2 heard, RRR. No JVD, murmurs, rubs, gallops or clicks. No pedal edema. Gastrointestinal system: Abdomen is nondistended, soft and nontender. No organomegaly or masses felt. Normal bowel sounds heard. Central nervous system: Alert and oriented. No focal neurological deficits. Extremities: Symmetric 5 x 5 power. Skin: No rashes, lesions or ulcers Psychiatry: Judgement and insight appear  normal. Mood & affect anxious.    Data Reviewed: I have personally reviewed following labs and imaging studies  CBC: Recent Labs  Lab 07/20/18 1053 07/21/18 0743 07/24/18 0630 07/25/18 0542  WBC 12.2* 12.2* 10.3 10.2  NEUTROABS 10.1*  --   --   --   HGB 13.1 13.1 12.2* 12.2*  HCT 41.5 41.5 40.2 39.5  MCV 100.2*  99.3 102.8* 102.1*  PLT 388 384 327 631   Basic Metabolic Panel: Recent Labs  Lab 07/20/18 1053 07/21/18 0743 07/22/18 0712 07/24/18 0630 07/25/18 0542  NA 135 135 134* 140 139  K 4.3 5.2* 4.8 4.3 4.4  CL 96* 96* 99 109 105  CO2 _0 GLUCOSE 129* 97 117* 104* 96  BUN 25* 23* 23* 22* 16  CREATININE 0.93 1.05 0.78 0.63 0.51*  CALCIUM 10.4* 10.7* 10.2 10.1 9.9   GFR: Estimated Creatinine Clearance: 128.4 mL/min (A) (by C-G formula based on SCr of 0.51 mg/dL (L)). Liver Function Tests: Recent Labs  Lab 07/20/18 1527 07/22/18 0712 07/24/18 0630 07/25/18 0542  AST 14* 17 14* 14*  ALT _1 ALKPHOS 129* 110 107 105  BILITOT 0.6 0.6 0.3 0.5  PROT 6.9 6.7 6.7 6.9  ALBUMIN 2.8* 2.4* 2.7* 2.7*   Recent Labs  Lab 07/20/18 1527  LIPASE 20   No results for input(s): AMMONIA in the last 168 hours. Coagulation Profile: Recent Labs  Lab 07/20/18 1053  INR 1.1   Cardiac Enzymes: No results for input(s): CKTOTAL, CKMB, CKMBINDEX, TROPONINI in the last 168 hours. BNP (last 3 results) No results for input(s): PROBNP in the last 8760 hours. HbA1C: No results for input(s): HGBA1C in the last 72 hours. CBG: No results for input(s): GLUCAP in the last 168 hours. Lipid Profile: No results for input(s): CHOL, HDL, LDLCALC, TRIG, CHOLHDL, LDLDIRECT in the last 72 hours. Thyroid Function Tests: No results for input(s): TSH, T4TOTAL, FREET4, T3FREE, THYROIDAB in the last 72 hours. Anemia Panel: No results for input(s): VITAMINB12, FOLATE, FERRITIN, TIBC, IRON, RETICCTPCT in the last 72 hours. Sepsis Labs: Recent Labs  Lab 07/21/18 0039  LATICACIDVEN 1.0    Recent Results (from the past 240 hour(s))  SARS Coronavirus 2 (CEPHEID - Performed in [facility] hospital lab), Hosp Order     Status: None   Collection Time: 07/20/18 11:06 AM   Specimen: Nasopharyngeal Swab  Result Value Ref Range Status   SARS Coronavirus 2 NEGATIVE NEGATIVE Final    Comment:  (NOTE) If result is NEGATIVE SARS-CoV-2 target nucleic acids are NOT DETECTED. The SARS-CoV-2 RNA is generally detectable in upper and lower  respiratory specimens during the acute phase of infection. The lowest  concentration of SARS-CoV-2 viral copies this assay can detect is 250  copies / mL. A negative result does not preclude SARS-CoV-2 infection  and should not be used as the sole basis for treatment or other  patient management decisions.  A negative result may occur with  improper specimen collection / handling, submission of specimen other  than nasopharyngeal swab, presence of viral mutation(s) within the  areas targeted by this assay, and inadequate number of viral copies  (<250 copies / mL). A negative result must be combined with clinical  observations, patient history, and epidemiological information. If result is POSITIVE SARS-CoV-2 target nucleic acids are DETECTED. The SARS-CoV-2 RNA is generally detectable in upper and lower  respiratory specimens dur ing the acute phase of infection.  Positive  results are  indicative of active infection with SARS-CoV-2.  Clinical  correlation with patient history and other diagnostic information is  necessary to determine patient infection status.  Positive results do  not rule out bacterial infection or co-infection with other viruses. If result is PRESUMPTIVE POSTIVE SARS-CoV-2 nucleic acids MAY BE PRESENT.   A presumptive positive result was obtained on the submitted specimen  and confirmed on repeat testing.  While 2019 novel coronavirus  (SARS-CoV-2) nucleic acids may be present in the submitted sample  additional confirmatory testing may be necessary for epidemiological  and / or clinical management purposes  to differentiate between  SARS-CoV-2 and other Sarbecovirus currently known to infect humans.  If clinically indicated additional testing with an alternate test  methodology [phone]) is advised. The SARS-CoV-2 RNA is  generally  detectable in upper and lower respiratory sp ecimens during the acute  phase of infection. The expected result is Negative. Fact Sheet for Patients:  StrictlyIdeas.no Fact Sheet for Healthcare Providers: BankingDealers.co.za This test is not yet approved or cleared by the [place] FDA and has been authorized for detection and/or diagnosis of SARS-CoV-2 by FDA under an Emergency Use Authorization (EUA).  This EUA will remain in effect (meaning this test can be used) for the duration of the COVID-19 declaration under Section 564(b)(1) of the Act, 21 U.S.C. section 360bbb-3(b)(1), unless the authorization is terminated or revoked sooner. Performed at [facility], [address], [place], [place] [address]          Radiology Studies: No results found.      Scheduled Meds: . dexamethasone (DECADRON) injection  4 mg Intravenous BID  . enoxaparin (LOVENOX) injection  40 mg Subcutaneous Daily  . feeding supplement  1 Container Oral Q24H  . feeding supplement (ENSURE ENLIVE)  237 mL Oral BID BM  . fentaNYL  1 patch Transdermal Q72H  . lactulose  30 g Oral Daily  . polyethylene glycol  17 g Oral Daily  . sodium chloride flush  3 mL Intravenous Q12H  . sorbitol, milk of mag, mineral oil, glycerin (SMOG) enema  960 mL Rectal Once   Continuous Infusions: . sodium chloride 100 mL/hr at 07/25/18 0942     LOS: 4 days    Time spent: 25 minutes     Barb Merino, MD [facility] Pager [phone]  If 7PM-7AM, please contact night-coverage [email or web address] Password [email or web address] 07/25/2018, 11:45 AM

## 2018-07-25 NOTE — Progress Notes (Signed)
Palliative:  HPI: 48 yo male with PMH significant of bladder tumor, prostate cancer s/p robotic assisted cystoprostatectomy, bradycardia, anxiety, depression admitted 07/20/18 with worsening back pain without any trauma/falls with plans for biopsy of left iliac crest for 07/23/18. LBM 6 days PTA and no relief of pain with use of home opioids, steroids, and muscle relaxer. Hospitalization has been complicated by continued poorly controlled pain.Pain slowly improving with adjustments. Beginning radiation therapy and hoping this improves pain as well.    I met again today with Rodney [name]. Rodney [name]'s pain is a little worse today than yesterday. He tells me that the IV dilaudid was making him too sleepy. I am happy with change to po dilaudid and we discussed dosage increase instead of going backwards to IV pain medication and he agrees. We also discussed the addition of Cymbalta and he also agrees. He tells me that he is willing to try almost anything that could help with his pain. He does understand that his pain will not go away but he is hoping it will be more tolerable and manageable for him to return home.   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 He tells me that he knew it had to be cancer and he looks forward to meeting with Rodney [name] to discuss his options further. Rodney [name] would like to know more specifically what he is dealing with and what to expect which he hopes to discuss with Rodney [name]. Rodney [name] inquires about having a family meeting with his daughters after he gathers more information. I explained that this could be arranged and if he d/c from hospital prior to this happening we can arrange for him to see outpatient palliative Monday morning visit with Rodney Lessen, NP.   [name] struggles with wanting to know his prognosis but immediately follows these statements with "I won't stop fighting." I did answer and explain that I worry with the  extent of his cancer and his symptom burden that this is not a good sign. I don't think he is ready to address his prognosis so I just provided reassurance that we will continue to support him to find improved pain relief and continue with his treatment plan. He finds strength from his faith and his family. All questions/concerns addressed. Emotional support provided.   I attempted to call back his fiance, Rodney [name] (with [facility] permission), but no answer.   Exam: Alert, oriented. Increased pain with mild-mod distress. Breathing regular, unlabored. Abd tender.   Recommendations/Plan:  Acute on chronic RUQ pain with severe radiation to back and across upper abdomen:  ? Fentanyl patch 75 mcg/hr. ? Dilaudid po 1 mg increased to 2 mg every 3 hours prn (may consider 1.5 mg if this is too sedating).  ? Continue decadron.  ? Flexeril 10 mg po TID prn.  ? Consider addition of neuropathic adjunctive such as gabapentin or Lyrica possibly in combination with Cymbalta. Plan to discuss this addition with patient tomorrow. We were interrupted by telephone call today.   Constipation:  ? SMOG enema yesterday with LBM 7/8.   ? Daily lactulose and miralax.  ? Goal is BM daily or at least every other day.   [place], NP Palliative Medicine Team Pager [phone] (Please see [email or web address] for schedule) Team Phone [phone]    Greater than 50%  of this time was spent counseling and coordinating care related to the above assessment and plan

## 2018-07-26 ENCOUNTER — Ambulatory Visit
Admit: 2018-07-26 | Discharge: 2018-07-26 | Disposition: A | Discharge status: Home / Self Care | Payer: PRIVATE HEALTH INSURANCE | Attending: Radiation Oncology | Admitting: Radiation Oncology

## 2018-07-26 ENCOUNTER — Inpatient Hospital Stay (HOSPITAL_COMMUNITY): Payer: PRIVATE HEALTH INSURANCE

## 2018-07-26 DIAGNOSIS — C801 Malignant (primary) neoplasm, unspecified: Secondary | ICD-10-CM

## 2018-07-26 DIAGNOSIS — R52 Pain, unspecified: Secondary | ICD-10-CM

## 2018-07-26 DIAGNOSIS — C7951 Secondary malignant neoplasm of bone: Secondary | ICD-10-CM

## 2018-07-26 DIAGNOSIS — Z515 Encounter for palliative care: Secondary | ICD-10-CM

## 2018-07-26 MED ORDER — SORBITOL 70 % SOLN
960.0000 mL | TOPICAL_OIL | Freq: Once | ORAL | Status: AC
  Administered 2018-07-26: 960 mL via RECTAL
  Filled 2018-07-26: qty 473

## 2018-07-26 MED ORDER — POLYETHYLENE GLYCOL 3350 17 G PO PACK
17.0000 g | PACK | Freq: Two times a day (BID) | ORAL | Status: DC
  Administered 2018-07-26 – 2018-07-28 (×5): 17 g via ORAL
  Filled 2018-07-26 (×5): qty 1

## 2018-07-26 NOTE — Progress Notes (Signed)
PMT DAILY PROGRESS NOTE:  HPI: 48 yo male with PMH significant of bladder tumor, prostate cancer s/p robotic assisted cystoprostatectomy, bradycardia, anxiety, depression admitted 07/20/18 with worsening back pain without any trauma/falls with plans for biopsy of left iliac crest for 07/23/18. LBM 6 days PTA and no relief of pain with use of home opioids, steroids, and muscle relaxer. Hospitalization has been complicated by continued poorly controlled pain.Pain slowly improving with adjustments. Beginning radiation therapy and hoping this improves pain as well.    Subjective:  Patient is awake alert, resting in bed. He is feeling ok, reports much better pain control today. He is awaiting going for radiation soon.  Life limiting illness:   metastatic urothelial cancer  Physical Exam: Alert, oriented.  Not in distress currently. Breathing regular, unlabored. Abd with mild generalized tenderness. Non focal.   Labs and imaging noted Med history noted. Discussed about pain management options with patient.   Recommendations/Plan:  Acute on chronic RUQ pain with severe radiation to back and across upper abdomen:  ? Fentanyl patch 75 mcg/hr. ? Dilaudid po 1 mg  2 mg every 3 hours prn   ? Continue decadron.  ? Flexeril 10 mg po TID prn.  ? Continue Cymbalta.    Constipation:   ? Daily lactulose and miralax.   Continue radiation treatments.  Continue IVF for hypercalcemia.   25 min   Loistine Chance MD  Palliative Medicine Team Pager [phone] (Please see [email or web address] for schedule) Team Phone [phone]    Greater than 50%  of this time was spent counseling and coordinating care related to the above assessment and plan

## 2018-07-26 NOTE — Progress Notes (Signed)
Smog  Enema given with moderate results. Some relief noted by patient.

## 2018-07-26 NOTE — Progress Notes (Signed)
Patient seen today for weekly visit after radiation treatment. Doing satisfactorily with XRT. Complained of significant tenderness at mid tibia on left, and I don't see imaging through this area. Could add short-course XRT to this area if needed.  Have ordered xrays of this area.  ------------------------------------------------  Jodelle Gross, MD, PhD

## 2018-07-26 NOTE — Progress Notes (Signed)
PROGRESS NOTE    Rodney [name]  SUP:[id number] DOB: 1970-02-01 DOA: 07/20/2018 PCP: Curlene Labrum, MD    Brief Narrative:  48 year old gentleman with history of bladder tumor, prostate cancer status post robotic assisted cystoprostatectomy, anxiety and depression who presented to the hospital with severe pain in his back over last week.  Patient recently had worsening pain on his hip and was found to have left iliac crest lesion.  On further imaging studies, he was found to have diffuse progression of bony mets throughout the spine, large medial left iliac wing metastasis with extraosseous extension of tumor.  Patient is admitted to the hospital because of severe pain, for radiation treatment and pain management.  Remains in the hospital with severe metastatic cancer pain and constipation.  Assessment & Plan:   Principal Problem:   Intractable pain Active Problems:   Malignant neoplasm of prostate (HCC)   Spine metastasis (HCC)   Constipation   Acute lower UTI   Leukocytosis   Metastatic cancer [facility])   Palliative care encounter  Metastatic urothelial cancer: With severe cancer pain.  Followed by palliative care for pain control.  Patient transferred to [facility] long for XRT.  Status post bone marrow biopsy on 07/22/2018 from his left iliac crest. - Currently remains on fentanyl patch 75 mcg/h, Decadron and IV and oral Dilaudid.  Appreciate palliative care help. - Continue with stool softener and laxatives.will give 1 more dose of enema today. - Followed by oncology and radiation therapy.  Patient is receiving radiation treatment.  Continue IV fluids.  Abnormal urinalysis: Patient was started on Rocephin on 07/20/2018 with abnormal urine.  Unfortunately urine culture was not sent.  Does not have any urinary symptoms today.  Antibiotics discontinued.  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  Continue on IV fluids.  DVT prophylaxis: Lovenox subcu Code Status: Full  code Family Communication: None Disposition Plan: Home after hospitalization   Consultants:   Oncology  Radiation oncology  Procedures:   Bone marrow biopsy left iliac crest, 07/22/2018  Antimicrobials:   Rocephin, 07/20/2018--07/25/2018   Subjective: Patient was seen and examined in the morning rounds.  Continues to have pain mostly on the both side of the chest, tender along the chest wall.  Is having difficulty schedule his oral pain medications.  No bowel movement last 24 hours.  He thinks enema was helpful day before.   Objective: Vitals:   07/25/18 0526 07/25/18 1402 07/25/18 2127 07/26/18 0453  BP: 110/90 (!) 132/91 130/86 (!) 134/96  Pulse: 91 97 95 92  Resp: _0 Temp: 97.9 F (36.6 C) 98.2 F (36.8 C) 98.3 F (36.8 C) (!) 97 F (36.1 C)  TempSrc: Oral Oral    SpO2: 98% 98% 98% 96%  Weight:      Height:        Intake/Output Summary (Last 24 hours) at 07/26/2018 1139 Last data filed at 07/26/2018 1010 Gross per 24 hour  Intake 4089.12 ml  Output 5800 ml  Net -1710.88 ml   Filed Weights   07/20/18 1019  Weight: 83.5 kg    Examination:  General exam: Appears calm and comfortable, chronically sick looking.  In mild distress and anxious. Respiratory system: Clear to auscultation. Respiratory effort normal.  Palpable tenderness on both side of the lower chest. Cardiovascular system: S1 & S2 heard, RRR. No JVD, murmurs, rubs, gallops or clicks. No pedal edema. Gastrointestinal system: Abdomen is nondistended, soft and nontender. No organomegaly or masses felt.  Normal bowel sounds heard. Central nervous system: Alert and oriented. No focal neurological deficits. Extremities: Symmetric 5 x 5 power. Skin: No rashes, lesions or ulcers Psychiatry: Judgement and insight appear normal. Mood & affect anxious.    Data Reviewed: I have personally reviewed following labs and imaging studies  CBC: Recent Labs  Lab 07/20/18 1053 07/21/18 0743 07/24/18  0630 07/25/18 0542  WBC 12.2* 12.2* 10.3 10.2  NEUTROABS 10.1*  --   --   --   HGB 13.1 13.1 12.2* 12.2*  HCT 41.5 41.5 40.2 39.5  MCV 100.2* 99.3 102.8* 102.1*  PLT 388 384 327 706   Basic Metabolic Panel: Recent Labs  Lab 07/20/18 1053 07/21/18 0743 07/22/18 0712 07/24/18 0630 07/25/18 0542  NA 135 135 134* 140 139  K 4.3 5.2* 4.8 4.3 4.4  CL 96* 96* 99 109 105  CO2 _0 GLUCOSE 129* 97 117* 104* 96  BUN 25* 23* 23* 22* 16  CREATININE 0.93 1.05 0.78 0.63 0.51*  CALCIUM 10.4* 10.7* 10.2 10.1 9.9   GFR: Estimated Creatinine Clearance: 128.4 mL/min (A) (by C-G formula based on SCr of 0.51 mg/dL (L)). Liver Function Tests: Recent Labs  Lab 07/20/18 1527 07/22/18 0712 07/24/18 0630 07/25/18 0542  AST 14* 17 14* 14*  ALT _1 ALKPHOS 129* 110 107 105  BILITOT 0.6 0.6 0.3 0.5  PROT 6.9 6.7 6.7 6.9  ALBUMIN 2.8* 2.4* 2.7* 2.7*   Recent Labs  Lab 07/20/18 1527  LIPASE 20   No results for input(s): AMMONIA in the last 168 hours. Coagulation Profile: Recent Labs  Lab 07/20/18 1053  INR 1.1   Cardiac Enzymes: No results for input(s): CKTOTAL, CKMB, CKMBINDEX, TROPONINI in the last 168 hours. BNP (last 3 results) No results for input(s): PROBNP in the last 8760 hours. HbA1C: No results for input(s): HGBA1C in the last 72 hours. CBG: No results for input(s): GLUCAP in the last 168 hours. Lipid Profile: No results for input(s): CHOL, HDL, LDLCALC, TRIG, CHOLHDL, LDLDIRECT in the last 72 hours. Thyroid Function Tests: No results for input(s): TSH, T4TOTAL, FREET4, T3FREE, THYROIDAB in the last 72 hours. Anemia Panel: No results for input(s): VITAMINB12, FOLATE, FERRITIN, TIBC, IRON, RETICCTPCT in the last 72 hours. Sepsis Labs: Recent Labs  Lab 07/21/18 0039  LATICACIDVEN 1.0    Recent Results (from the past 240 hour(s))  SARS Coronavirus 2 (CEPHEID - Performed in [facility] hospital lab), Hosp Order     Status: None   Collection  Time: 07/20/18 11:06 AM   Specimen: Nasopharyngeal Swab  Result Value Ref Range Status   SARS Coronavirus 2 NEGATIVE NEGATIVE Final    Comment: (NOTE) If result is NEGATIVE SARS-CoV-2 target nucleic acids are NOT DETECTED. The SARS-CoV-2 RNA is generally detectable in upper and lower  respiratory specimens during the acute phase of infection. The lowest  concentration of SARS-CoV-2 viral copies this assay can detect is 250  copies / mL. A negative result does not preclude SARS-CoV-2 infection  and should not be used as the sole basis for treatment or other  patient management decisions.  A negative result may occur with  improper specimen collection / handling, submission of specimen other  than nasopharyngeal swab, presence of viral mutation(s) within the  areas targeted by this assay, and inadequate number of viral copies  (<250 copies / mL). A negative result must be combined with clinical  observations, patient history, and epidemiological information. If result is POSITIVE  SARS-CoV-2 target nucleic acids are DETECTED. The SARS-CoV-2 RNA is generally detectable in upper and lower  respiratory specimens dur ing the acute phase of infection.  Positive  results are indicative of active infection with SARS-CoV-2.  Clinical  correlation with patient history and other diagnostic information is  necessary to determine patient infection status.  Positive results do  not rule out bacterial infection or co-infection with other viruses. If result is PRESUMPTIVE POSTIVE SARS-CoV-2 nucleic acids MAY BE PRESENT.   A presumptive positive result was obtained on the submitted specimen  and confirmed on repeat testing.  While 2019 novel coronavirus  (SARS-CoV-2) nucleic acids may be present in the submitted sample  additional confirmatory testing may be necessary for epidemiological  and / or clinical management purposes  to differentiate between  SARS-CoV-2 and other Sarbecovirus currently known  to infect humans.  If clinically indicated additional testing with an alternate test  methodology [phone]) is advised. The SARS-CoV-2 RNA is generally  detectable in upper and lower respiratory sp ecimens during the acute  phase of infection. The expected result is Negative. Fact Sheet for Patients:  StrictlyIdeas.no Fact Sheet for Healthcare Providers: BankingDealers.co.za This test is not yet approved or cleared by the [place] FDA and has been authorized for detection and/or diagnosis of SARS-CoV-2 by FDA under an Emergency Use Authorization (EUA).  This EUA will remain in effect (meaning this test can be used) for the duration of the COVID-19 declaration under Section 564(b)(1) of the Act, 21 U.S.C. section 360bbb-3(b)(1), unless the authorization is terminated or revoked sooner. Performed at [facility], [address] [place], [place] [address]          Radiology Studies: No results found.      Scheduled Meds: . dexamethasone (DECADRON) injection  4 mg Intravenous BID  . DULoxetine  30 mg Oral Daily  . enoxaparin (LOVENOX) injection  40 mg Subcutaneous Daily  . feeding supplement  1 Container Oral Q24H  . feeding supplement (ENSURE ENLIVE)  237 mL Oral BID BM  . fentaNYL  1 patch Transdermal Q72H  . lactulose  30 g Oral Daily  . polyethylene glycol  17 g Oral BID  . sodium chloride flush  3 mL Intravenous Q12H  . sorbitol, milk of mag, mineral oil, glycerin (SMOG) enema  960 mL Rectal Once   Continuous Infusions: . sodium chloride 100 mL/hr at 07/26/18 0826     LOS: 5 days    Time spent: 25 minutes     Barb Merino, MD [facility] Pager [phone]  If 7PM-7AM, please contact night-coverage [email or web address] Password [facility] 07/26/2018, 11:39 AM

## 2018-07-27 LAB — COMPREHENSIVE METABOLIC PANEL
ALT: 17 U/L (ref 0–44)
AST: 12 U/L — ABNORMAL LOW (ref 15–41)
Albumin: 2.7 g/dL — ABNORMAL LOW (ref 3.5–5.0)
Alkaline Phosphatase: 123 U/L (ref 38–126)
Anion gap: 10 (ref 5–15)
BUN: 15 mg/dL (ref 6–20)
CO2: 26 mmol/L (ref 22–32)
Calcium: 9.9 mg/dL (ref 8.9–10.3)
Chloride: 103 mmol/L (ref 98–111)
Creatinine, Ser: 0.78 mg/dL (ref 0.61–1.24)
GFR calc Af Amer: >60 mL/min (ref >60)
GFR calc non Af Amer: >60 mL/min (ref >60)
Glucose, Bld: 161 mg/dL — ABNORMAL HIGH (ref 70–99)
Potassium: 4.4 mmol/L (ref 3.5–5.1)
Sodium: 139 mmol/L (ref 135–145)
Total Bilirubin: 0.3 mg/dL (ref 0.3–1.2)
Total Protein: 6.7 g/dL (ref 6.5–8.1)

## 2018-07-27 LAB — MAGNESIUM: Magnesium: 2.1 mg/dL (ref 1.7–2.4)

## 2018-07-27 LAB — PHOSPHORUS: Phosphorus: 2.8 mg/dL (ref 2.5–4.6)

## 2018-07-27 NOTE — Progress Notes (Signed)
Rodney [name] has 16 beats of VT per cardiac monitor. Patient resting in bed with no acute distress. Aroused to name. Denies CP,SOB when questioned. No c/o voiced. VSS afebrile see vitals. [facility], [place] notified. No new orders obtained at this time. Will continue to monitor and assess patient.

## 2018-07-27 NOTE — Progress Notes (Signed)
PROGRESS NOTE    Rodney [name]  JGO:[id number] DOB: 11/20/1970 DOA: 07/20/2018 PCP: Curlene Labrum, MD    Brief Narrative:  48 year old gentleman with history of bladder tumor, prostate cancer status post robotic assisted cystoprostatectomy, anxiety and depression who presented to the hospital with severe pain in his back over last week.  Patient recently had worsening pain on his hip and was found to have left iliac crest lesion.  On further imaging studies, he was found to have diffuse progression of bony mets throughout the spine, large medial left iliac wing metastasis with extraosseous extension of tumor.  Patient is admitted to the hospital because of severe pain, for radiation treatment and pain management.  Remains in the hospital with severe metastatic cancer pain and constipation and getting XRT.  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  Followed by palliative care for pain control.  Patient transferred to [facility] long for XRT.  Status post bone marrow biopsy on 07/22/2018 from his left iliac crest. - Currently remains on fentanyl patch 75 mcg/h, Decadron and IV and oral Dilaudid.  Appreciate palliative care help. - Continue with stool softener and laxatives. Had good results with enema , will repeat tomorrow. - Followed by oncology and radiation therapy.  Patient is receiving radiation treatment.  Continue IV fluids. - also has painful lesion right tibia, for XRT.  Abnormal urinalysis: Patient was started on Rocephin on 07/20/2018 with abnormal urine.  Unfortunately urine culture was not sent.  Does not have any urinary  symptoms today.  Antibiotics discontinued.  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  Continue on IV fluids.  DVT prophylaxis: Lovenox subcu Code Status: Full code Family Communication: None Disposition Plan: Home after hospitalization   Consultants:   Oncology  Radiation oncology  Procedures:   Bone marrow biopsy left iliac crest, 07/22/2018  Antimicrobials:   Rocephin, 07/20/2018--07/25/2018   Subjective: Patient was seen and examined in the morning rounds.  Continues to have pain today it is mostly on the left lower leg where he has new metastatic lesion. He had good results with Enema yesterday. Will continue oral laxatives today. Overnight events noted, short bouts of V. tach noted in telemetry review.  Electrolytes were checked and are normal.  No recurrence.  Denies any chest pain.  Was asymptomatic.  Objective: Vitals:   07/26/18 0453 07/26/18 1343 07/26/18 2131 07/27/18 0422  BP: (!) 134/96 112/83 (!) 132/91 (!) 141/98  Pulse: 92 95 86 91  Resp: 20 17 18 18   Temp: (!) 97 F (36.1 C) 97.6 F (36.4 C) 97.8 F (36.6 C) 97.8 F (36.6 C)  TempSrc:  Oral Oral Oral  SpO2: 96% 96% 96% 97%  Weight:      Height:        Intake/Output Summary (Last 24 hours) at 07/27/2018 1121 Last data filed at 07/27/2018 0600 Gross per 24 hour  Intake 2242.42 ml  Output 3050 ml  Net -807.58 ml   Filed Weights   07/20/18 1019  Weight: 83.5 kg    Examination:  General exam: Appears calm and comfortable, chronically sick looking.  Is anxious.  Respiratory system: Clear to auscultation. Respiratory effort normal.  Palpable tenderness on both side of the lower chest. Cardiovascular system: S1 & S2 heard, RRR. No JVD, murmurs, rubs, gallops or clicks. No pedal edema. Gastrointestinal system: Abdomen is nondistended, soft and nontender. No organomegaly or masses felt. Normal bowel sounds heard. Central nervous system: Alert and oriented. No focal neurological  deficits. Extremities: Symmetric 5 x 5 power. Skin: No rashes, lesions or ulcers Psychiatry: Judgement and insight appear normal. Mood & affect anxious.    Data Reviewed: I have personally reviewed following labs and imaging studies  CBC: Recent Labs  Lab 07/21/18 0743 07/24/18 0630 07/25/18 0542  WBC 12.2* 10.3 10.2  HGB 13.1 12.2* 12.2*  HCT 41.5 40.2 39.5  MCV 99.3 102.8* 102.1*  PLT 384 327 729   Basic Metabolic Panel: Recent Labs  Lab 07/21/18 0743 07/22/18 0712 07/24/18 0630 07/25/18 0542 07/27/18 0947  NA 135 134* 140 139 139  K 5.2* 4.8 4.3 4.4 4.4  CL 96* 99 109 105 103  CO2 28 26 24 25 26   GLUCOSE 97 117* 104* 96 161*  BUN 23* 23* 22* 16 15  CREATININE 1.05 0.78 0.63 0.51* 0.78  CALCIUM 10.7* 10.2 10.1 9.9 9.9  MG  --   --   --   --  2.1  PHOS  --   --   --   --  2.8   GFR: Estimated Creatinine Clearance: 128.4 mL/min (by C-G formula based on SCr of 0.78 mg/dL). Liver Function Tests: Recent Labs  Lab 07/20/18 1527 07/22/18 0712 07/24/18 0630 07/25/18 0542 07/27/18 0947  AST 14* 17 14* 14* 12*  ALT 19 16 16 17 17   ALKPHOS 129* 110 107 105 123  BILITOT 0.6 0.6 0.3 0.5 0.3  PROT 6.9 6.7 6.7 6.9 6.7  ALBUMIN 2.8* 2.4* 2.7* 2.7* 2.7*   Recent Labs  Lab 07/20/18 1527  LIPASE 20   No results for input(s): AMMONIA in the last 168 hours. Coagulation Profile: No results for input(s): INR, PROTIME in the last 168 hours. Cardiac Enzymes: No results for input(s): CKTOTAL, CKMB, CKMBINDEX, TROPONINI in the last 168 hours. BNP (last 3 results) No results for input(s): PROBNP in the last 8760 hours. HbA1C: No results for input(s): HGBA1C in the last 72 hours. CBG: No results for input(s): GLUCAP in the last 168 hours. Lipid Profile: No results for input(s): CHOL, HDL, LDLCALC, TRIG, CHOLHDL, LDLDIRECT in the last 72 hours. Thyroid Function Tests: No results for input(s): TSH, T4TOTAL, FREET4, T3FREE, THYROIDAB in the last 72 hours. Anemia Panel:  No results for input(s): VITAMINB12, FOLATE, FERRITIN, TIBC, IRON, RETICCTPCT in the last 72 hours. Sepsis Labs: Recent Labs  Lab 07/21/18 0039  LATICACIDVEN 1.0    Recent Results (from the past 240 hour(s))  SARS Coronavirus 2 (CEPHEID - Performed in [facility] hospital lab), Hosp Order     Status: None   Collection Time: 07/20/18 11:06 AM   Specimen: Nasopharyngeal Swab  Result Value Ref Range Status   SARS Coronavirus 2 NEGATIVE NEGATIVE Final    Comment: (NOTE) If result is NEGATIVE SARS-CoV-2 target nucleic acids are NOT DETECTED. The SARS-CoV-2 RNA is generally detectable in upper and lower  respiratory specimens during the acute phase of infection. The lowest  concentration of SARS-CoV-2 viral copies this assay can detect is 250  copies / mL. A negative result does not preclude SARS-CoV-2 infection  and should not be used as the sole basis for treatment or other  patient  management decisions.  A negative result may occur with  improper specimen collection / handling, submission of specimen other  than nasopharyngeal swab, presence of viral mutation(s) within the  areas targeted by this assay, and inadequate number of viral copies  (<250 copies / mL). A negative result must be combined with clinical  observations, patient history, and epidemiological information. If result is POSITIVE SARS-CoV-2 target nucleic acids are DETECTED. The SARS-CoV-2 RNA is generally detectable in upper and lower  respiratory specimens dur ing the acute phase of infection.  Positive  results are indicative of active infection with SARS-CoV-2.  Clinical  correlation with patient history and other diagnostic information is  necessary to determine patient infection status.  Positive results do  not rule out bacterial infection or co-infection with other viruses. If result is PRESUMPTIVE POSTIVE SARS-CoV-2 nucleic acids MAY BE PRESENT.   A presumptive positive result was obtained on the submitted  specimen  and confirmed on repeat testing.  While 2019 novel coronavirus  (SARS-CoV-2) nucleic acids may be present in the submitted sample  additional confirmatory testing may be necessary for epidemiological  and / or clinical management purposes  to differentiate between  SARS-CoV-2 and other Sarbecovirus currently known to infect humans.  If clinically indicated additional testing with an alternate test  methodology [phone]) is advised. The SARS-CoV-2 RNA is generally  detectable in upper and lower respiratory sp ecimens during the acute  phase of infection. The expected result is Negative. Fact Sheet for Patients:  StrictlyIdeas.no Fact Sheet for Healthcare Providers: BankingDealers.co.za This test is not yet approved or cleared by the [place] FDA and has been authorized for detection and/or diagnosis of SARS-CoV-2 by FDA under an Emergency Use Authorization (EUA).  This EUA will remain in effect (meaning this test can be used) for the duration of the COVID-19 declaration under Section 564(b)(1) of the Act, 21 U.S.C. section 360bbb-3(b)(1), unless the authorization is terminated or revoked sooner. Performed at [facility], [address] [place], [place] [address]          Radiology Studies: Dg Tibia/fibula Left  Result Date: 07/26/2018 CLINICAL DATA:  Metastatic carcinoma with diffuse bone metastases. Tenderness. EXAM: LEFT TIBIA AND FIBULA - 2 VIEW COMPARISON:  None. FINDINGS: Two small lytic lesions are identified in the cortex of the mid tibial diaphysis. IMPRESSION: The 2 lytic lesions adjacent to 1 another in the cortex of the mid tibial diaphysis are most consistent with metastatic disease given history and likely the cause of the patient's pain. Electronically Signed   By: Dorise Bullion III M.D   On: 07/26/2018 19:14        Scheduled Meds: . dexamethasone (DECADRON) injection  4 mg Intravenous BID  .  DULoxetine  30 mg Oral Daily  . enoxaparin (LOVENOX) injection  40 mg Subcutaneous Daily  . feeding supplement  1 Container Oral Q24H  . feeding supplement (ENSURE ENLIVE)  237 mL Oral BID BM  . fentaNYL  1 patch Transdermal Q72H  . lactulose  30 g Oral Daily  . polyethylene glycol  17 g Oral BID  . sodium chloride flush  3 mL Intravenous Q12H  . sorbitol, milk of mag, mineral oil, glycerin (SMOG) enema  960 mL Rectal Once   Continuous Infusions: . sodium chloride 100 mL/hr at 07/27/18 0600     LOS: 6 days    Time spent: 25 minutes     Barb Merino, MD [facility] Pager [phone]  If 7PM-7AM, please contact night-coverage [email or web address]  Password [email or web address] 07/27/2018, 11:21 AM

## 2018-07-28 MED ORDER — ALUM & MAG HYDROXIDE-SIMETH 200-200-20 MG/5ML PO SUSP
15.0000 mL | Freq: Once | ORAL | Status: AC
  Administered 2018-07-28: 15 mL via ORAL
  Filled 2018-07-28: qty 30

## 2018-07-28 MED ORDER — SORBITOL 70 % SOLN
960.0000 mL | TOPICAL_OIL | Freq: Once | ORAL | Status: DC
  Filled 2018-07-28: qty 473

## 2018-07-28 MED ORDER — PANTOPRAZOLE SODIUM 40 MG PO TBEC
40.0000 mg | DELAYED_RELEASE_TABLET | Freq: Every day | ORAL | Status: DC
  Administered 2018-07-28 – 2018-08-02 (×6): 40 mg via ORAL
  Filled 2018-07-28 (×6): qty 1

## 2018-07-28 MED ORDER — SENNA 8.6 MG PO TABS: 2.0000 | ORAL_TABLET | Freq: Two times a day (BID) | ORAL | Status: DC

## 2018-07-28 MED ORDER — ALUM & MAG HYDROXIDE-SIMETH 200-200-20 MG/5ML PO SUSP
15.0000 mL | Freq: Four times a day (QID) | ORAL | Status: DC | PRN
  Administered 2018-07-29: 15 mL via ORAL
  Filled 2018-07-28: qty 30

## 2018-07-28 MED ORDER — METHYLNALTREXONE BROMIDE 12 MG/0.6ML ~~LOC~~ SOLN
12.0000 mg | Freq: Once | SUBCUTANEOUS | Status: AC
  Administered 2018-07-28: 22:00:00 12 mg via SUBCUTANEOUS
  Filled 2018-07-28 (×2): qty 0.6

## 2018-07-28 MED ORDER — FENTANYL 100 MCG/HR TD PT72
1.0000 | MEDICATED_PATCH | TRANSDERMAL | Status: DC
  Administered 2018-07-28 – 2018-07-31 (×2): 1 via TRANSDERMAL
  Filled 2018-07-28 (×2): qty 1

## 2018-07-28 NOTE — Progress Notes (Signed)
Palliative:  HPI:48 yo male with PMH significant of bladder tumor, prostate cancer s/p robotic assisted cystoprostatectomy, bradycardia, anxiety, depression admitted 07/20/18 with worsening back pain without any trauma/falls with plans for biopsy of left iliac crest for 07/23/18. LBM 6 days PTA and no relief of pain with use of home opioids, steroids, and muscle relaxer. Hospitalization has been complicated by continued poorly controlled pain.Pain slowly improving with adjustments. Beginning radiation therapy and hoping this improves pain as well.     I met again today with Rodney [name]. He continues to have pain, although it is improving slightly, he is hopeful for better control. He complains of chest "tightness" just prior to my visit but without any pain radiation. EKG is being done and appears normal. He did belch and had improved chest pain at this time. I assisted him to set up his dinner. He is trying to improve his appetite and intake.   We further discussed plan for pain management including increase fentanyl patch and I recommended that we hold on increasing dilaudid po prn. Continues with constipation and recommendation listed below.   All questions/concerns addressed. Emotional support provided.   Exam: Alert, oriented. No distress. Breathing regular, unlabored. Abd tender.   Plan:   New chest "pain" or "tightness" improved with belching although he has had a run of VT per RN notes.  ? EKG done and reviewed. Continue cardiac monitoring.  ? Protonix 40 mg daily (should have with decadron anyway).  ? Maalox/Mylanta prn.   Acute on chronic RUQ pain with severe radiation to back and across upper abdomen: ? Fentanyl patchincreased to 100 mcg/hr.  ? Dilaudid po 2 mg every 3 hours prn.  ? Continue decadron. ? Flexeril 10 mg po TID prn. ? Cymbalta 30 mg po daily.   Constipation:  ? D/C SMOG enema and all laxatives.  ? Ordered Relistor SQ injection x 1 today.    30 min  Alicia  Parker, NP Palliative Medicine Team Pager [phone] (Please see [email or web address] for schedule) Team Phone [phone]    Greater than 50%  of this time was spent counseling and coordinating care related to the above assessment and plan  

## 2018-07-28 NOTE — Progress Notes (Signed)
PROGRESS NOTE    Rodney [name]  QQV:[id number] DOB: 07/30/1970 DOA: 07/20/2018 PCP: Curlene Labrum, MD    Brief Narrative:  48 year old gentleman with history of bladder tumor, prostate cancer status post robotic assisted cystoprostatectomy, anxiety and depression who presented to the hospital with severe pain in his back over last week.  Patient recently had worsening pain on his hip and was found to have left iliac crest lesion.  On further imaging studies, he was found to have diffuse progression of bony mets throughout the spine, large medial left iliac wing metastasis with extraosseous extension of tumor.  Patient is admitted to the hospital because of severe pain, for radiation treatment and pain management.  Remains in the hospital with severe metastatic cancer pain and constipation and getting XRT.  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  Followed by palliative care for pain control.  Patient transferred to [facility] long for XRT. Status post bone marrow biopsy on 07/22/2018 from his left iliac crest that shows metastatic urothelial cancer.  He also has bony lesion on his left tibia and it is painful. - Currently remains on fentanyl patch 75 mcg/h, Decadron and IV and oral Dilaudid.  Appreciate palliative care help. - Continue with stool softener and laxatives. Had good results with enema , will repeat today. - Followed by oncology and radiation therapy.  Patient is receiving radiation treatment.  Continue IV fluids. - also has painful lesion right tibia, for XRT.  Abnormal urinalysis: Patient was started on  Rocephin on 07/20/2018 with abnormal urine.  Unfortunately urine culture was not sent.  Does not have any urinary symptoms today.  Antibiotics discontinued.  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  Continue on IV fluids.  Ambulate and advance activities. Repeat SMOG enema today.   DVT prophylaxis: Lovenox subcu Code Status: Full code Family Communication: None Disposition Plan: Home after hospitalization, unknown duration.   Consultants:   Oncology  Radiation oncology  Procedures:   Bone marrow biopsy left iliac crest, 07/22/2018  Antimicrobials:   Rocephin, 07/20/2018--07/25/2018   Subjective: Patient was seen and examined.  Continues to have pain issues.  Had to use some IV Dilaudid at night.  No bowel movement since last enema.  Urinating without difficulty.  Objective: Vitals:   07/27/18 0422 07/27/18 1429 07/27/18 2031 07/28/18 0533  BP: (!) 141/98 (!) 142/87 (!) 135/92 (!) 132/95  Pulse: 91 89 99 91  Resp: 18 18 19  (!) 21  Temp: 97.8 F (36.6 C) 97.6 F (36.4 C) 97.9 F (36.6 C) (!) 97.5 F (36.4 C)  TempSrc: Oral Oral Oral Oral  SpO2: 97% 97% 98% 99%  Weight:      Height:        Intake/Output Summary (Last 24 hours) at 07/28/2018 1125 Last data filed at 07/28/2018 1100 Gross per 24 hour  Intake 3563.08 ml  Output 7850 ml  Net -4286.92 ml   Filed Weights   07/20/18 1019  Weight: 83.5 kg    Examination:  General exam: Appears calm and comfortable, chronically sick looking.  Anxious looking. Respiratory system: Clear to auscultation. Respiratory effort  normal.  Palpable tenderness on both side of the lower chest. Cardiovascular system: S1 & S2 heard, RRR. No JVD, murmurs, rubs, gallops or clicks. No pedal edema. Gastrointestinal system: Abdomen is distended without rigidity or guarding, soft and nontender. No organomegaly or masses felt. Normal bowel sounds heard. Central nervous system: Alert and oriented. No focal neurological  deficits. Extremities: Symmetric 5 x 5 power. Skin: No rashes, lesions or ulcers Psychiatry: Judgement and insight appear normal. Mood & affect anxious.    Data Reviewed: I have personally reviewed following labs and imaging studies  CBC: Recent Labs  Lab 07/24/18 0630 07/25/18 0542  WBC 10.3 10.2  HGB 12.2* 12.2*  HCT 40.2 39.5  MCV 102.8* 102.1*  PLT 327 326   Basic Metabolic Panel: Recent Labs  Lab 07/22/18 0712 07/24/18 0630 07/25/18 0542 07/27/18 0947  NA 134* 140 139 139  K 4.8 4.3 4.4 4.4  CL 99 109 105 103  CO2 26 24 25 26   GLUCOSE 117* 104* 96 161*  BUN 23* 22* 16 15  CREATININE 0.78 0.63 0.51* 0.78  CALCIUM 10.2 10.1 9.9 9.9  MG  --   --   --  2.1  PHOS  --   --   --  2.8   GFR: Estimated Creatinine Clearance: 128.4 mL/min (by C-G formula based on SCr of 0.78 mg/dL). Liver Function Tests: Recent Labs  Lab 07/22/18 0712 07/24/18 0630 07/25/18 0542 07/27/18 0947  AST 17 14* 14* 12*  ALT 16 16 17 17   ALKPHOS 110 107 105 123  BILITOT 0.6 0.3 0.5 0.3  PROT 6.7 6.7 6.9 6.7  ALBUMIN 2.4* 2.7* 2.7* 2.7*   No results for input(s): LIPASE, AMYLASE in the last 168 hours. No results for input(s): AMMONIA in the last 168 hours. Coagulation Profile: No results for input(s): INR, PROTIME in the last 168 hours. Cardiac Enzymes: No results for input(s): CKTOTAL, CKMB, CKMBINDEX, TROPONINI in the last 168 hours. BNP (last 3 results) No results for input(s): PROBNP in the last 8760 hours. HbA1C: No results for input(s): HGBA1C in the last 72 hours. CBG: No results for input(s): GLUCAP in the last 168 hours. Lipid Profile: No results for input(s): CHOL, HDL, LDLCALC, TRIG, CHOLHDL, LDLDIRECT in the last 72 hours. Thyroid Function Tests: No results for input(s): TSH, T4TOTAL, FREET4, T3FREE, THYROIDAB in the last 72 hours. Anemia Panel: No results for input(s): VITAMINB12, FOLATE, FERRITIN, TIBC, IRON, RETICCTPCT in the last 72 hours. Sepsis Labs: No  results for input(s): PROCALCITON, LATICACIDVEN in the last 168 hours.  Recent Results (from the past 240 hour(s))  SARS Coronavirus 2 (CEPHEID - Performed in [facility] hospital lab), Hosp Order     Status: None   Collection Time: 07/20/18 11:06 AM   Specimen: Nasopharyngeal Swab  Result Value Ref Range Status   SARS Coronavirus 2 NEGATIVE NEGATIVE Final    Comment: (NOTE) If result is NEGATIVE SARS-CoV-2 target nucleic acids are NOT DETECTED. The SARS-CoV-2 RNA is generally detectable in upper and lower  respiratory specimens during the acute phase of infection. The lowest  concentration of SARS-CoV-2 viral copies this assay can detect is 250  copies / mL. A negative result does not preclude SARS-CoV-2 infection  and should not be used as the sole basis for treatment or other  patient management decisions.  A negative result may occur with  improper specimen collection / handling, submission of specimen other  than nasopharyngeal swab, presence of viral mutation(s) within the  areas targeted by this  assay, and inadequate number of viral copies  (<250 copies / mL). A negative result must be combined with clinical  observations, patient history, and epidemiological information. If result is POSITIVE SARS-CoV-2 target nucleic acids are DETECTED. The SARS-CoV-2 RNA is generally detectable in upper and lower  respiratory specimens dur ing the acute phase of infection.  Positive  results are indicative of active infection with SARS-CoV-2.  Clinical  correlation with patient history and other diagnostic information is  necessary to determine patient infection status.  Positive results do  not rule out bacterial infection or co-infection with other viruses. If result is PRESUMPTIVE POSTIVE SARS-CoV-2 nucleic acids MAY BE PRESENT.   A presumptive positive result was obtained on the submitted specimen  and confirmed on repeat testing.  While 2019 novel coronavirus  (SARS-CoV-2) nucleic  acids may be present in the submitted sample  additional confirmatory testing may be necessary for epidemiological  and / or clinical management purposes  to differentiate between  SARS-CoV-2 and other Sarbecovirus currently known to infect humans.  If clinically indicated additional testing with an alternate test  methodology [phone]) is advised. The SARS-CoV-2 RNA is generally  detectable in upper and lower respiratory sp ecimens during the acute  phase of infection. The expected result is Negative. Fact Sheet for Patients:  StrictlyIdeas.no Fact Sheet for Healthcare Providers: BankingDealers.co.za This test is not yet approved or cleared by the [place] FDA and has been authorized for detection and/or diagnosis of SARS-CoV-2 by FDA under an Emergency Use Authorization (EUA).  This EUA will remain in effect (meaning this test can be used) for the duration of the COVID-19 declaration under Section 564(b)(1) of the Act, 21 U.S.C. section 360bbb-3(b)(1), unless the authorization is terminated or revoked sooner. Performed at [facility], [address], [place], [place] [address]          Radiology Studies: Dg Tibia/fibula Left  Result Date: 07/26/2018 CLINICAL DATA:  Metastatic carcinoma with diffuse bone metastases. Tenderness. EXAM: LEFT TIBIA AND FIBULA - 2 VIEW COMPARISON:  None. FINDINGS: Two small lytic lesions are identified in the cortex of the mid tibial diaphysis. IMPRESSION: The 2 lytic lesions adjacent to 1 another in the cortex of the mid tibial diaphysis are most consistent with metastatic disease given history and likely the cause of the patient's pain. Electronically Signed   By: Dorise Bullion III M.D   On: 07/26/2018 19:14        Scheduled Meds: . dexamethasone (DECADRON) injection  4 mg Intravenous BID  . DULoxetine  30 mg Oral Daily  . enoxaparin (LOVENOX) injection  40 mg Subcutaneous Daily  . feeding  supplement  1 Container Oral Q24H  . feeding supplement (ENSURE ENLIVE)  237 mL Oral BID BM  . fentaNYL  1 patch Transdermal Q72H  . lactulose  30 g Oral Daily  . polyethylene glycol  17 g Oral BID  . sodium chloride flush  3 mL Intravenous Q12H  . sorbitol, milk of mag, mineral oil, glycerin (SMOG) enema  960 mL Rectal Once   Continuous Infusions: . sodium chloride 100 mL/hr at 07/28/18 0805     LOS: 7 days    Time spent: 25 minutes     Barb Merino, MD [facility] Pager [phone]  If 7PM-7AM, please contact night-coverage [email or web address] Password [email or web address] 07/28/2018, 11:25 AM

## 2018-07-29 ENCOUNTER — Ambulatory Visit: Payer: PRIVATE HEALTH INSURANCE | Admitting: Oncology

## 2018-07-29 ENCOUNTER — Ambulatory Visit
Admit: 2018-07-29 | Discharge: 2018-07-29 | Disposition: A | Discharge status: Home / Self Care | Payer: PRIVATE HEALTH INSURANCE | Attending: Radiation Oncology | Admitting: Radiation Oncology

## 2018-07-29 DIAGNOSIS — C679 Malignant neoplasm of bladder, unspecified: Secondary | ICD-10-CM

## 2018-07-29 MED ORDER — CYCLOBENZAPRINE HCL 5 MG PO TABS
5.0000 mg | ORAL_TABLET | Freq: Three times a day (TID) | ORAL | Status: DC | PRN
  Administered 2018-07-29: 5 mg via ORAL
  Filled 2018-07-29: qty 1

## 2018-07-29 NOTE — Progress Notes (Signed)
IP PROGRESS NOTE  Subjective:   Events in the last few days noted.  Rodney [name] hospitalized for intractable pain and found to have diffuse metastatic bone disease.  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  He is receiving palliative radiation therapy to the spine with improvement in his pain slightly.  He is eating better at this time without any new complaints of nausea, vomiting or changes in his bowel habits.  Objective:  Vital signs in last 24 hours: Temp:  [97.6 F (36.4 C)-97.9 F (36.6 C)] 97.8 F (36.6 C) (07/13 0431) Pulse Rate:  [83-100] 90 (07/13 0431) Resp:  [16-20] 16 (07/13 0431) BP: (119-134)/(81-89) 134/89 (07/13 0431) SpO2:  [97 %-100 %] 100 % (07/13 0431) Weight:  [173 lb 1 oz (78.5 kg)] 173 lb 1 oz (78.5 kg) (07/13 0431) Weight change:  Last BM Date: 07/26/18  Intake/Output from previous day: 07/12 0701 - 07/13 0700 In: 2937 [P.O.:1920; I.V.:1017] Out: 6599 [Urine:6275] General: Alert, awake without distress. Head: Normocephalic atraumatic. Mouth: mucous membranes moist, pharynx normal without lesions Eyes: No scleral icterus.  Pupils are equal and round reactive to light. Resp: clear to auscultation bilaterally without rhonchi or wheezes or dullness to percussion. Cardio: regular rate and rhythm, S1, S2 normal, no murmur, click, rub or gallop GI: soft, non-tender; bowel sounds normal; no masses,  no organomegaly Musculoskeletal: No joint deformity or effusion. Neurological: No motor, sensory deficits.  Intact deep tendon reflexes. Skin: No rashes or lesions.    Lab Results: No results for input(s): WBC, HGB, HCT, PLT in the last 72 hours.  BMET Recent Labs    07/27/18 0947  NA 139  K 4.4  CL 103  CO2 26  GLUCOSE 161*  BUN 15  CREATININE 0.78  CALCIUM 9.9    Studies/Results: No results found.  Medications: I have reviewed the patient's current  medications.  Assessment/Plan:  48 year old with:  1.  Metastatic carcinoma documented in June 2020.  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     His disease process was discussed today and treatment options were reviewed.  It would likely require palliative chemotherapy after completing radiation which will be scheduled as an outpatient after follow-up.  Chemotherapy will likely offer decrease in his disease burden and possible further palliation of his disease.  2.  Bone pain: He is currently receiving palliative radiation therapy with improvement in his pain.  Medicine services is assisting his pain control and pain is improving slightly.  3.  Follow-up: We will arrange follow-up upon his discharge to discuss further treatment of his cancer moving forward.  25  minutes was spent with the patient face-to-face today.  More than 50% of time was spent on reviewing his disease status, pathology results and outlining future treatment plan.     LOS: 8 days   Zola Button 07/29/2018, 8:25 AM

## 2018-07-29 NOTE — Progress Notes (Signed)
PROGRESS NOTE    Rodney [name]  BWG:[id number] DOB: September 20, 1970 DOA: 07/20/2018 PCP: Curlene Labrum, MD    Brief Narrative:  47 year old gentleman with history of bladder tumor, prostate cancer status post robotic assisted cystoprostatectomy, anxiety and depression who presented to the hospital with severe pain in his back over last week.  Patient recently had worsening pain on his hip and was found to have left iliac crest lesion.  On further imaging studies, he was found to have diffuse progression of bony mets throughout the spine, large medial left iliac wing metastasis with extraosseous extension of tumor.  Patient is admitted to the hospital because of severe pain, for radiation treatment and pain management.  Remains in the hospital with severe metastatic cancer pain and constipation and getting XRT.  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  Followed by palliative care for pain control.  Patient transferred to [facility] long for XRT. Status post bone marrow biopsy on 07/22/2018 from his left iliac crest that shows metastatic urothelial cancer.  He also has bony lesion on his left tibia and it is painful. Currently remains on fentanyl patch with increasing dose to 100 mcg/h, Decadron and IV and oral Dilaudid.  Appreciate palliative care help. Poor response to stool softener and laxatives.  Was given 1 dose of methylnaltrexone.  Waiting for results.   Followed by oncology and radiation therapy.  Patient is receiving radiation treatment.  Continue IV fluids. also has painful lesion right tibia, for XRT.  Abnormal  urinalysis: Patient was started on Rocephin on 07/20/2018 with abnormal urine.  Unfortunately urine culture was not sent.  Does not have any urinary symptoms today.  Antibiotics discontinued.  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  Continue on IV fluids.  Ambulate and advance activities.  Discontinue telemetry. Walk in the hallway. Anticipate discharge home tomorrow if able to have good pain control.  DVT prophylaxis: Lovenox subcu Code Status: Full code Family Communication: None Disposition Plan: Home after hospitalization, unknown duration.   Consultants:   Oncology  Radiation oncology  Palliative medicine   Procedures:   Bone marrow biopsy left iliac crest, 07/22/2018  Antimicrobials:   Rocephin, 07/20/2018--07/25/2018   Subjective: Patient was seen and examined.  Has slightly better pain control after increasing dose of fentanyl. No bowel movement yet.  Was given 1 dose of Relistor yesterday. Fianc on the phone.  No other overnight events.  Objective: Vitals:   07/28/18 0533 07/28/18 1815 07/28/18 2111 07/29/18 0431  BP: (!) 132/95 123/81 119/86 134/89  Pulse: 91 100 83 90  Resp: (!) 21 16 20 16   Temp: (!) 97.5 F (36.4 C) 97.6 F (36.4 C) 97.9 F (36.6 C) 97.8 F (36.6 C)  TempSrc: Oral Oral Oral Oral  SpO2: 99% 98% 97% 100%  Weight:    78.5 kg  Height:    6' 2"  (1.88 m)    Intake/Output Summary (Last 24 hours) at 07/29/2018 1127 Last data filed at 07/29/2018 1023 Gross per 24 hour  Intake 3642.01 ml  Output 6775 ml  Net -3132.99 ml   Filed Weights   07/20/18  1019 07/29/18 0431  Weight: 83.5 kg 78.5 kg    Examination:  General exam: Appears calm and comfortable, chronically sick looking and anxious.  Respiratory system: Clear to auscultation. Respiratory effort normal.   Cardiovascular system: S1 & S2 heard, RRR. No JVD, murmurs, rubs, gallops or clicks. No pedal edema. Gastrointestinal system: Abdomen is distended without  rigidity or guarding, soft and nontender. No organomegaly or masses felt. Normal bowel sounds heard. Central nervous system: Alert and oriented. No focal neurological deficits. Extremities: Symmetric 5 x 5 power. Skin: No rashes, lesions or ulcers Psychiatry: Judgement and insight appear normal. Mood & affect anxious.    Data Reviewed: I have personally reviewed following labs and imaging studies  CBC: Recent Labs  Lab 07/24/18 0630 07/25/18 0542  WBC 10.3 10.2  HGB 12.2* 12.2*  HCT 40.2 39.5  MCV 102.8* 102.1*  PLT 327 226   Basic Metabolic Panel: Recent Labs  Lab 07/24/18 0630 07/25/18 0542 07/27/18 0947  NA 140 139 139  K 4.3 4.4 4.4  CL 109 105 103  CO2 24 25 26   GLUCOSE 104* 96 161*  BUN 22* 16 15  CREATININE 0.63 0.51* 0.78  CALCIUM 10.1 9.9 9.9  MG  --   --  2.1  PHOS  --   --  2.8   GFR: Estimated Creatinine Clearance: 122.7 mL/min (by C-G formula based on SCr of 0.78 mg/dL). Liver Function Tests: Recent Labs  Lab 07/24/18 0630 07/25/18 0542 07/27/18 0947  AST 14* 14* 12*  ALT 16 17 17   ALKPHOS 107 105 123  BILITOT 0.3 0.5 0.3  PROT 6.7 6.9 6.7  ALBUMIN 2.7* 2.7* 2.7*   No results for input(s): LIPASE, AMYLASE in the last 168 hours. No results for input(s): AMMONIA in the last 168 hours. Coagulation Profile: No results for input(s): INR, PROTIME in the last 168 hours. Cardiac Enzymes: No results for input(s): CKTOTAL, CKMB, CKMBINDEX, TROPONINI in the last 168 hours. BNP (last 3 results) No results for input(s): PROBNP in the last 8760 hours. HbA1C: No results for input(s): HGBA1C in the last 72 hours. CBG: No results for input(s): GLUCAP in the last 168 hours. Lipid Profile: No results for input(s): CHOL, HDL, LDLCALC, TRIG, CHOLHDL, LDLDIRECT in the last 72 hours. Thyroid Function Tests: No results for input(s): TSH, T4TOTAL, FREET4, T3FREE, THYROIDAB in the last 72 hours. Anemia Panel: No results for input(s): VITAMINB12, FOLATE,  FERRITIN, TIBC, IRON, RETICCTPCT in the last 72 hours. Sepsis Labs: No results for input(s): PROCALCITON, LATICACIDVEN in the last 168 hours.  Recent Results (from the past 240 hour(s))  SARS Coronavirus 2 (CEPHEID - Performed in [place] hospital lab), Hosp Order     Status: None   Collection Time: 07/20/18 11:06 AM   Specimen: Nasopharyngeal Swab  Result Value Ref Range Status   SARS Coronavirus 2 NEGATIVE NEGATIVE Final    Comment: (NOTE) If result is NEGATIVE SARS-CoV-2 target nucleic acids are NOT DETECTED. The SARS-CoV-2 RNA is generally detectable in upper and lower  respiratory specimens during the acute phase of infection. The lowest  concentration of SARS-CoV-2 viral copies this assay can detect is 250  copies / mL. A negative result does not preclude SARS-CoV-2 infection  and should not be used as the sole basis for treatment or other  patient management decisions.  A negative result may occur with  improper specimen collection / handling, submission of specimen other  than nasopharyngeal swab, presence of viral mutation(s) within the  areas targeted by  this assay, and inadequate number of viral copies  (<250 copies / mL). A negative result must be combined with clinical  observations, patient history, and epidemiological information. If result is POSITIVE SARS-CoV-2 target nucleic acids are DETECTED. The SARS-CoV-2 RNA is generally detectable in upper and lower  respiratory specimens dur ing the acute phase of infection.  Positive  results are indicative of active infection with SARS-CoV-2.  Clinical  correlation with patient history and other diagnostic information is  necessary to determine patient infection status.  Positive results do  not rule out bacterial infection or co-infection with other viruses. If result is PRESUMPTIVE POSTIVE SARS-CoV-2 nucleic acids MAY BE PRESENT.   A presumptive positive result was obtained on the submitted specimen  and confirmed  on repeat testing.  While 2019 novel coronavirus  (SARS-CoV-2) nucleic acids may be present in the submitted sample  additional confirmatory testing may be necessary for epidemiological  and / or clinical management purposes  to differentiate between  SARS-CoV-2 and other Sarbecovirus currently known to infect humans.  If clinically indicated additional testing with an alternate test  methodology [phone]) is advised. The SARS-CoV-2 RNA is generally  detectable in upper and lower respiratory sp ecimens during the acute  phase of infection. The expected result is Negative. Fact Sheet for Patients:  StrictlyIdeas.no Fact Sheet for Healthcare Providers: BankingDealers.co.za This test is not yet approved or cleared by the [place] FDA and has been authorized for detection and/or diagnosis of SARS-CoV-2 by FDA under an Emergency Use Authorization (EUA).  This EUA will remain in effect (meaning this test can be used) for the duration of the COVID-19 declaration under Section 564(b)(1) of the Act, 21 U.S.C. section 360bbb-3(b)(1), unless the authorization is terminated or revoked sooner. Performed at [facility], [address], [place], [place] [address]          Radiology Studies: No results found.      Scheduled Meds: . dexamethasone (DECADRON) injection  4 mg Intravenous BID  . DULoxetine  30 mg Oral Daily  . enoxaparin (LOVENOX) injection  40 mg Subcutaneous Daily  . feeding supplement  1 Container Oral Q24H  . feeding supplement (ENSURE ENLIVE)  237 mL Oral BID BM  . fentaNYL  1 patch Transdermal Q72H  . pantoprazole  40 mg Oral Daily  . sodium chloride flush  3 mL Intravenous Q12H   Continuous Infusions: . sodium chloride 100 mL/hr at 07/29/18 0356     LOS: 8 days    Time spent: 25 minutes     Barb Merino, MD [facility] Pager [phone]  If 7PM-7AM, please contact night-coverage [email or web address]  Password [facility] 07/29/2018, 11:27 AM

## 2018-07-29 NOTE — Progress Notes (Signed)
Palliative:  HPI:48 yo male with PMH significant of bladder tumor, prostate cancer s/p robotic assisted cystoprostatectomy, bradycardia, anxiety, depression admitted 07/20/18 with worsening back pain without any trauma/falls with plans for biopsy of left iliac crest for 07/23/18. LBM 6 days PTA and no relief of pain with use of home opioids, steroids, and muscle relaxer. Hospitalization has been complicated by continued poorly controlled pain.Pain slowly improving with adjustments. Beginning radiation therapy and hoping this improves pain as well.Making progress with pain management but still struggling with constipation.    I met again today with Rodney [name]. He continues sleeping very deeply when I came to visit the second time around. He did arouse but it was initially difficult to awaken him. I was worried that he was oversedated but he did awaken and had no signs of respiratory depression or confusion and we were able to have full conversation. We agreed to decrease dose of flexeril as this seemed to be too strong for him and he does not like feeling this sleepy. He denies any further nausea or chest pain. Still has not had a BM - educated that we must wait another 1-2 days for Relistor to act for him before trying again or adding other laxatives. He understands and is pleased with plan. We discussed goo nutrition and he continues to eat what he can and supplements with Boost Breeze which he likes. He does not care for Ensure.   I spent some time listening and speaking with Rodney [name]. I think he is becoming lonely in the hospital without any visitors. Would benefit from continued spiritual care visits. All questions/concerns addressed. Emotional support provided.   I think having a BM regimen is his largest barrier to discharge at this stage. There is certainly room for improvement with his pain management but I just increased fentanyl patch yesterday so will make no new adjustments today.   Exam: Lethargic but  arouses. No confusion. No distress. Breathing regular, unlabored. Abd tender.   Plan:  Plan:   New chest "pain" or "tightness" improved with belching although he has had a run of VT per RN notes. Improved 7/13.  ? Continue cardiac monitoring.  ? Protonix 40 mg daily.  ? Maalox/Mylanta prn.   Acute on chronic RUQ pain with severe radiation to back and across upper abdomen: ? Fentanyl patchincreased to 100 mcg/hr.  ? Dilaudid po 2 mg every 3 hours prn.  ? Continue decadron. ? Flexeril 5 mg po TID prn.Consider scheduled if he tolerates this without being to sedated.  ? Cymbalta 30 mg po daily.   Constipation:  ? Relistor SQ injection given 7/12. Consider repeat on 7/14 or 8/27.   40 min  Vinie Sill, NP Palliative Medicine Team Pager [phone] (Please see [email or web address] for schedule) Team Phone [phone]    Greater than 50%  of this time was spent counseling and coordinating care related to the above assessment and plan

## 2018-07-30 ENCOUNTER — Ambulatory Visit
Admit: 2018-07-30 | Discharge: 2018-07-30 | Disposition: A | Discharge status: Home / Self Care | Payer: PRIVATE HEALTH INSURANCE | Attending: Radiation Oncology | Admitting: Radiation Oncology

## 2018-07-30 ENCOUNTER — Other Ambulatory Visit: Payer: Self-pay

## 2018-07-30 ENCOUNTER — Telehealth: Payer: Self-pay | Admitting: Oncology

## 2018-07-30 LAB — TROPONIN I (HIGH SENSITIVITY)
Troponin I (High Sensitivity): 2 ng/L (ref <18)
Troponin I (High Sensitivity): <2 ng/L (ref <18)

## 2018-07-30 MED ORDER — METHYLNALTREXONE BROMIDE 12 MG/0.6ML ~~LOC~~ SOLN
12.0000 mg | Freq: Once | SUBCUTANEOUS | Status: AC
  Administered 2018-07-30: 12 mg via SUBCUTANEOUS
  Filled 2018-07-30: qty 0.6

## 2018-07-30 MED ORDER — BOOST / RESOURCE BREEZE PO LIQD CUSTOM
1.0000 | Freq: Three times a day (TID) | ORAL | Status: DC
  Administered 2018-07-30 – 2018-08-02 (×4): 1 via ORAL

## 2018-07-30 NOTE — Progress Notes (Signed)
PROGRESS NOTE    Rodney [name]  IBB:[id number] DOB: Jun 06, 1970 DOA: 07/20/2018 PCP: Curlene Labrum, MD    Brief Narrative:  48 year old gentleman with history of bladder tumor, prostate cancer status post robotic assisted cystoprostatectomy, anxiety and depression who presented to the hospital with severe pain in his back over last week.  Patient recently had worsening pain on his hip and was found to have left iliac crest lesion.  On further imaging studies, he was found to have diffuse progression of bony mets throughout the spine, large medial left iliac wing metastasis with extraosseous extension of tumor.  Patient is admitted to the hospital because of severe pain, for radiation treatment and pain management.  Remains in the hospital with severe metastatic cancer pain and constipation and getting XRT.  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  Followed by palliative care for pain control.  Patient transferred to [facility] long for XRT. Status post bone marrow biopsy on 07/22/2018 from his left iliac crest that shows metastatic urothelial cancer.  He also has bony lesion on his left tibia and it is painful. Currently remains on fentanyl patch with increasing dose to 100 mcg/h, Decadron and IV and oral Dilaudid.  Appreciate palliative care help. Poor response to stool softener and laxatives.  Was given 1 dose of methylnaltrexone. No response, repeat one today.    Followed by oncology and radiation therapy.  Patient is receiving radiation treatment.  Continue IV fluids. also has painful lesion right tibia, for XRT.   Abnormal urinalysis: Patient was started on Rocephin on 07/20/2018 with abnormal urine.  Unfortunately urine culture was not sent.  Does not have any urinary symptoms today.  Antibiotics discontinued.  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  Continue on IV fluids.  Ambulate and advance activities.  Discontinue telemetry. Walk in the hallway. Anticipate discharge home tomorrow if able to have good pain control and bowel regulated.  DVT prophylaxis: Lovenox subcu Code Status: Full code Family Communication: None Disposition Plan: Home after hospitalization, unknown duration.   Consultants:   Oncology  Radiation oncology  Palliative medicine   Procedures:   Bone marrow biopsy left iliac crest, 07/22/2018  Antimicrobials:   Rocephin, 07/20/2018--07/25/2018   Subjective: Patient was seen and examined.  Overnight events noted.  Patient had episode of chest pain with no evidence of ischemia.  Mostly due to reflux and constipation. Pain is slightly controlled but remains 7-8 out of 10. No bowel movement for last 48 hours.  Methylnaltrexone and no response, will repeat one today.  Objective: Vitals:   07/29/18 1441 07/29/18 2015 07/30/18 0116 07/30/18 0445  BP: 126/76 122/86 (!) 139/95 115/86  Pulse: 92 100 100 (!) 105  Resp: 16 16 16 16   Temp: 97.9 F (36.6 C) 98.5 F (36.9 C) (!) 97.3 F (36.3 C) 97.7 F (36.5 C)  TempSrc: Oral     SpO2: 100% 100% 99% 100%  Weight:      Height:        Intake/Output Summary (Last 24 hours) at 07/30/2018 1200 Last data filed at 07/30/2018 1114 Gross per 24 hour  Intake 1798 ml  Output 10675 ml  Net -8877 ml   Filed Weights   07/20/18 1019 07/29/18 0431  Weight: 83.5 kg 78.5 kg    Examination:  General exam: Appears calm and comfortable, chronically sick looking and anxious.  Respiratory system: Clear to auscultation. Respiratory effort normal.   Cardiovascular system: S1 & S2 heard, RRR. No JVD, murmurs, rubs,  gallops or clicks. No pedal edema. Gastrointestinal system: Abdomen is distended without rigidity or guarding, soft and nontender. No organomegaly or masses felt. Normal bowel sounds heard. Central nervous system: Alert and oriented. No focal neurological deficits. Extremities: Symmetric 5 x 5 power.  Year. Skin: No rashes, lesions or ulcers Psychiatry: Judgement and insight appear normal. Mood & affect anxious.    Data Reviewed: I have personally reviewed following labs and imaging studies  CBC: Recent Labs  Lab 07/24/18 0630 07/25/18 0542  WBC 10.3 10.2  HGB 12.2* 12.2*  HCT 40.2 39.5  MCV 102.8* 102.1*  PLT 327 967   Basic Metabolic Panel: Recent Labs  Lab 07/24/18 0630 07/25/18 0542 07/27/18 0947  NA 140 139 139  K 4.3 4.4 4.4  CL 109 105 103  CO2 24 25 26   GLUCOSE 104* 96 161*  BUN 22* 16 15  CREATININE 0.63 0.51* 0.78  CALCIUM 10.1 9.9 9.9  MG  --   --  2.1  PHOS  --   --  2.8   GFR: Estimated Creatinine Clearance: 122.7 mL/min (by C-G formula based on SCr of 0.78 mg/dL). Liver Function Tests: Recent Labs  Lab 07/24/18 0630 07/25/18 0542 07/27/18 0947  AST 14* 14* 12*  ALT 16 17 17   ALKPHOS 107 105 123  BILITOT 0.3 0.5 0.3  PROT 6.7 6.9 6.7  ALBUMIN 2.7* 2.7* 2.7*   No results for input(s): LIPASE, AMYLASE in the last 168 hours. No results for input(s): AMMONIA in the last 168 hours. Coagulation Profile: No results for input(s): INR, PROTIME in the last 168 hours. Cardiac Enzymes: No results for input(s): CKTOTAL, CKMB, CKMBINDEX, TROPONINI in the last 168 hours. BNP (last 3 results) No results for input(s): PROBNP in the last 8760 hours. HbA1C: No results for input(s): HGBA1C in the last 72 hours. CBG: No results for input(s): GLUCAP in the last 168 hours. Lipid Profile: No results for input(s): CHOL, HDL, LDLCALC, TRIG, CHOLHDL, LDLDIRECT in the last 72 hours. Thyroid Function Tests: No results for input(s): TSH, T4TOTAL, FREET4, T3FREE,  THYROIDAB in the last 72 hours. Anemia Panel: No results for input(s): VITAMINB12, FOLATE, FERRITIN, TIBC, IRON, RETICCTPCT in the last 72 hours. Sepsis Labs: No results for input(s): PROCALCITON, LATICACIDVEN in the last 168 hours.  No results found for this or any previous visit (from the past 240 hour(s)).       Radiology Studies: No results found.      Scheduled Meds: . dexamethasone (DECADRON) injection  4 mg Intravenous BID  . DULoxetine  30 mg Oral Daily  . enoxaparin (LOVENOX) injection  40 mg Subcutaneous Daily  . feeding supplement  1 Container Oral TID BM  . fentaNYL  1 patch Transdermal Q72H  . methylnaltrexone  12 mg Subcutaneous Once  . pantoprazole  40 mg Oral Daily  . sodium chloride flush  3 mL Intravenous Q12H   Continuous Infusions: . sodium chloride 100 mL/hr at 07/30/18 0625     LOS: 9 days    Time spent: 25 minutes     Barb Merino, MD [facility] Pager [phone]  If 7PM-7AM,  please contact night-coverage [email or web address] Password [email or web address] 07/30/2018, 12:00 PM

## 2018-07-30 NOTE — Progress Notes (Signed)
Patient complaining of chest pain, 8/10. Sharp and stabbing. Hurts with and without swallowing. Maalox given at 2145. Dilaudid at 0115.  EKG in chart. Will page on call.

## 2018-07-30 NOTE — Progress Notes (Signed)
Nutrition Follow-up  INTERVENTION:   -Continue Boost Breeze po TID, each supplement provides 250 kcal and 9 grams of protein -D/c Ensure supplements -per pt preference  NUTRITION DIAGNOSIS:   Increased nutrient needs related to cancer and cancer related treatments as evidenced by estimated needs.  Ongoing.  GOAL:   Patient will meet greater than or equal to 90% of their needs  Progressing.  MONITOR:   PO intake, Supplement acceptance, Labs, Weight trends, I & O's  ASSESSMENT:   48 y.o. male with medical history significant of bladder tumor, prostate cancer s/p robotic assisted cystoprostatectomy, anxiety, and depression; who presents with complaints of severe pain in his back over the last week.  **RD working remotely**  Patient eating better, consuming 65-100% of meals currently. Pt is drinking Boost Breeze and prefers these over the Ensure, will discontinue.   Per weight records, pt's weight is now -11 lbs as of 7/13.   Labs reviewed. Medications reviewed.  Diet Order:   Diet Order            Diet regular Room service appropriate? Yes with Assist; Fluid consistency: Thin  Diet effective now              EDUCATION NEEDS:   Not appropriate for education at this time  Skin:  Skin Assessment: Reviewed RN Assessment  Last BM:  7/13  Height:   Ht Readings from Last 1 Encounters:  07/29/18 6\' 2"  (1.88 m)    Weight:   Wt Readings from Last 1 Encounters:  07/29/18 78.5 kg    Ideal Body Weight:  86.3 kg  BMI:  Body mass index is 22.22 kg/m.  Estimated Nutritional Needs:   Kcal:  7124-5809  Protein:  120-130g  Fluid:  2.3L/day  Clayton Bibles, MS, RD, LDN [place] Dietitian Pager: [phone] After Hours Pager: [phone]

## 2018-07-30 NOTE — Telephone Encounter (Signed)
Scheduled appt per 7/13 sch message - unable to reach pt . Left message with appt date and time

## 2018-07-30 NOTE — Progress Notes (Signed)
Palliative:  I came to f/u with Rodney [name] but he is gone to radiation. I will f/u tomorrow. Noted that repeat Relistor was given today which I agree and hopefully he will have BM.   No charge  Vinie Sill, NP Palliative Medicine Team Pager [phone] (Please see [email or web address] for schedule) Team Phone [phone]    Greater than 50%  of this time was spent counseling and coordinating care related to the above assessment and plan

## 2018-07-31 ENCOUNTER — Telehealth: Payer: Self-pay

## 2018-07-31 ENCOUNTER — Ambulatory Visit
Admit: 2018-07-31 | Discharge: 2018-07-31 | Disposition: A | Discharge status: Home / Self Care | Payer: PRIVATE HEALTH INSURANCE | Attending: Radiation Oncology | Admitting: Radiation Oncology

## 2018-07-31 DIAGNOSIS — C61 Malignant neoplasm of prostate: Secondary | ICD-10-CM

## 2018-07-31 DIAGNOSIS — C799 Secondary malignant neoplasm of unspecified site: Secondary | ICD-10-CM

## 2018-07-31 DIAGNOSIS — D72829 Elevated white blood cell count, unspecified: Secondary | ICD-10-CM

## 2018-07-31 LAB — CBC WITH DIFFERENTIAL/PLATELET
Abs Immature Granulocytes: 0.12 10*3/uL — ABNORMAL HIGH (ref 0.00–0.07)
Basophils Absolute: 0 10*3/uL (ref 0.0–0.1)
Basophils Relative: 0 %
Eosinophils Absolute: 0.1 10*3/uL (ref 0.0–0.5)
Eosinophils Relative: 1 %
HCT: 37.3 % — ABNORMAL LOW (ref 39.0–52.0)
Hemoglobin: 11.2 g/dL — ABNORMAL LOW (ref 13.0–17.0)
Immature Granulocytes: 1 %
Lymphocytes Relative: 3 %
Lymphs Abs: 0.3 10*3/uL — ABNORMAL LOW (ref 0.7–4.0)
MCH: 30.9 pg (ref 26.0–34.0)
MCHC: 30 g/dL (ref 30.0–36.0)
MCV: 102.8 fL — ABNORMAL HIGH (ref 80.0–100.0)
Monocytes Absolute: 0.7 10*3/uL (ref 0.1–1.0)
Monocytes Relative: 7 %
Neutro Abs: 9.1 10*3/uL — ABNORMAL HIGH (ref 1.7–7.7)
Neutrophils Relative %: 88 %
Platelets: 271 10*3/uL (ref 150–400)
RBC: 3.63 MIL/uL — ABNORMAL LOW (ref 4.22–5.81)
RDW: 12.1 % (ref 11.5–15.5)
WBC: 10.3 10*3/uL (ref 4.0–10.5)
nRBC: 0 % (ref 0.0–0.2)

## 2018-07-31 LAB — COMPREHENSIVE METABOLIC PANEL
ALT: 15 U/L (ref 0–44)
AST: 11 U/L — ABNORMAL LOW (ref 15–41)
Albumin: 2.4 g/dL — ABNORMAL LOW (ref 3.5–5.0)
Alkaline Phosphatase: 140 U/L — ABNORMAL HIGH (ref 38–126)
Anion gap: 11 (ref 5–15)
BUN: 14 mg/dL (ref 6–20)
CO2: 26 mmol/L (ref 22–32)
Calcium: 9.4 mg/dL (ref 8.9–10.3)
Chloride: 99 mmol/L (ref 98–111)
Creatinine, Ser: 0.64 mg/dL (ref 0.61–1.24)
GFR calc Af Amer: >60 mL/min (ref >60)
GFR calc non Af Amer: >60 mL/min (ref >60)
Glucose, Bld: 172 mg/dL — ABNORMAL HIGH (ref 70–99)
Potassium: 3.9 mmol/L (ref 3.5–5.1)
Sodium: 136 mmol/L (ref 135–145)
Total Bilirubin: 0.5 mg/dL (ref 0.3–1.2)
Total Protein: 6.4 g/dL — ABNORMAL LOW (ref 6.5–8.1)

## 2018-07-31 LAB — PHOSPHORUS: Phosphorus: 2.4 mg/dL — ABNORMAL LOW (ref 2.5–4.6)

## 2018-07-31 LAB — MAGNESIUM: Magnesium: 2 mg/dL (ref 1.7–2.4)

## 2018-07-31 MED ORDER — K PHOS MONO-SOD PHOS DI & MONO 155-852-130 MG PO TABS
500.0000 mg | ORAL_TABLET | Freq: Two times a day (BID) | ORAL | Status: AC
  Administered 2018-07-31 – 2018-08-01 (×2): 500 mg via ORAL
  Filled 2018-07-31 (×2): qty 2

## 2018-07-31 MED ORDER — POLYETHYLENE GLYCOL 3350 17 G PO PACK
17.0000 g | PACK | Freq: Two times a day (BID) | ORAL | Status: DC
  Administered 2018-07-31 – 2018-08-02 (×3): 17 g via ORAL
  Filled 2018-07-31 (×3): qty 1

## 2018-07-31 MED ORDER — SENNOSIDES-DOCUSATE SODIUM 8.6-50 MG PO TABS
1.0000 | ORAL_TABLET | Freq: Two times a day (BID) | ORAL | Status: DC
  Administered 2018-07-31 – 2018-08-01 (×2): 1 via ORAL
  Filled 2018-07-31 (×2): qty 1

## 2018-07-31 MED ORDER — CYCLOBENZAPRINE HCL 5 MG PO TABS
2.5000 mg | ORAL_TABLET | Freq: Three times a day (TID) | ORAL | Status: DC | PRN
  Administered 2018-08-01: 2.5 mg via ORAL
  Filled 2018-07-31: qty 1

## 2018-07-31 MED ORDER — BISACODYL 10 MG RE SUPP
10.0000 mg | Freq: Once | RECTAL | Status: AC
  Administered 2018-08-01: 10 mg via RECTAL
  Filled 2018-07-31: qty 1

## 2018-07-31 NOTE — Progress Notes (Signed)
PROGRESS NOTE    Rodney [name]  MRN:[id number] DOB: 08/03/1970 DOA: 07/20/2018 PCP: [name], Steven E, MD   Brief Narrative:  The patient is a 48-year-old gentleman with history of bladder tumor, prostate cancer status post robotic assisted cystoprostatectomy, anxiety and depression who presented to the hospital with severe pain in his back over last week.  Patient recently had worsening pain on his hip and was found to have left iliac crest lesion.  On further imaging studies, he was found to have diffuse progression of bony mets throughout the spine, large medial left iliac wing metastasis with extraosseous extension of tumor.  Patient is admitted to the hospital because of severe pain, for radiation treatment and pain management.  Remains in the hospital with severe metastatic cancer pain and hospitalization has been complicated by constipation. Palliative involved in Pain Control and currently continuing to get XRT while hospitalized.  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  Followed by palliative care for pain control.   -MRI done on 07/20/2018 showed widespread spinal and partially visible rib and pelvic metastasis along with epidural and or neuroforaminal extension of the tumor at T3, T4, T6, T8, L1, L3, L5 but no malignant spinal stenosis or cord compression noted.  There is multiple metastasis to the spine that are slightly larger since the MRI last month but no pathological vertebral fracture.  No intradural metastasis is noted but there was a  large medial left iliac wing metastasis with extraosseous extension the tumor is substantially increased since 07/07/2018  -Patient transferred to [facility] for XRT.  -Status post bone marrow biopsy on 07/22/2018 from his left iliac crest that shows metastatic urothelial cancer.  -He also has bony lesion on his left tibia and it is painful and would not allow me to palpate.  -Currently remains on Fentanyl patch with increasing dose to 100 mcg/h, Decadron 4 mg IV BID and IV and or continue with cyclobenzaprine 5 mg al Dilaudid.  Appreciate palliative care help in managing Pain -Continue with cyclobenzaprine 5 mg p.o. 3 times daily as needed for muscle spasms -Continue with duloxetine 30 mg p.o. daily -Poor response to stool softener and laxatives.  Was given 2 doses of Methylnaltrexone; C/w Bisacodyl Suppository 10 mg x1, Bisacodyl 5 mg po Dailyprn D/C'd and will add Miralax 17 g BID and Senna-Docusate 1 tab po BID  -Followed by oncology and radiation therapy.   -Patient is receiving radiation treatment.  Continue IV fluids but reduced rate as below . -Also has painful lesion left tibia, for XRT. -Pain is limiting his ambulation will get PT and OT to evaluate and treat; continue to ambulate and advance activities as tolerated  Severe Opioid Induced Constipation -Currently remains on fentanyl patch with increasing dose to 100 mcg/h, Decadron and IV and oral Dilaudid.  -Poor response to stool softener and laxatives.  Was given 2 doses of methylnaltrexone; C/w Bisacodyl Suppository 10 mg x1, Bisacodyl 5 mg po Dailyprn D/C'd and will add Miralax 17 g BID and Senna-Docusate 1 tab po BID  Abnormal Urinalysis -Patient was started   on Rocephin on 07/20/2018 with abnormal urine. -Unfortunately urine culture was not sent.   -Does not have any urinary symptoms today.   -Antibiotics have since been discontinued.  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  -Continue on IV fluids but reduced rate to 50 mL/hr x12 more hours   Hypophosphatemia -Patient's Phos Level this AM was 2.4 -Replete with po KPhos Neutral 500 mg po BID x2 -Continue to Monitor and Replete as Necessary -Repeat CMP in AM   Macrocytic Anemia -Patient's hemoglobin/hematocrit went from 12.2/39.5 and is now 11.2/37.3; MCV is 102.8 today -Check anemia panel in the a.m. -Continue to monitor for signs and symptoms of bleeding; currently no overt bleeding noted -Repeat CBC in a.m.  Hyperglycemia -In the setting of IV steroid demargination from dexamethasone -Continue monitor and trend blood sugars carefully -Obtain hemoglobin A1c in the morning to rule out diabetes component  Anxiety and Depression -C/w Duloxetine 30 mg po Daily   GERD/Heartburn -C/w Pantoprazole 40 mg po Daily   DVT prophylaxis: Enoxaparin 40 mg sq Daily  Code Status: FULL CODE Family Communication: No family present at bedside  Disposition Plan: Pending Improvement in Pain and Bowel Movement  Consultants:   Medical Oncology  Radiation Oncology  Palliative Care Medicine   Procedures: XRT  Bone marrow biopsy left iliac crest, 07/22/2018  Antimicrobials:  Anti-infectives (From admission, onward)   Start     Dose/Rate Route Frequency Ordered Stop   07/20/18 2200  cefTRIAXone (ROCEPHIN) 1 g in sodium chloride 0.9 % 100 mL IVPB  Status:  Discontinued     1 g 200 mL/hr over 30 Minutes Intravenous Every 24 hours 07/20/18 2225 07/25/18 0853   07/20/18 1915  cefTRIAXone (ROCEPHIN) 1 g in sodium chloride 0.9 % 100 mL IVPB     1 g 200 mL/hr over 30 Minutes Intravenous  Once 07/20/18 1903 07/20/18 2239     Subjective: Seen and examined at bedside and states that his pain is fairly well-controlled when he is not ambulating.  Denies any chest pain, lightheadedness or dizziness but states that he has not had a proper bowel movement in 5 days and that the medication that they gave him yesterday did not  help.  No nausea or vomiting but also complaining of severe left leg pain and did not want me to palpate his leg.  No other concerns or complaints at this time.  Objective: Vitals:   07/30/18 1329 07/30/18 2120 07/31/18 0429 07/31/18 1459  BP: 134/85 128/86 133/81 133/81  Pulse: (!) 109 95 91 (!) 102  Resp: _0 Temp: 98.1 F (36.7 C) 98 F (36.7 C) 97.7 F (36.5 C) 97.7 F (36.5 C)  TempSrc:  Oral Oral Oral  SpO2: 98% 99% 100% 100%  Weight:      Height:        Intake/Output Summary (Last 24 hours) at 07/31/2018 1552 Last data filed at 07/31/2018 1258 Gross per 24 hour  Intake 1790.23 ml  Output 6200 ml  Net -4409.77 ml   Filed Weights   07/20/18 1019 07/29/18 0431  Weight: 83.5 kg 78.5 kg   Examination: Physical Exam:  Constitutional: Thin chronically ill-appearing Caucasian male in NAD and appears calm but uncomfortable  Eyes: Lids and conjunctivae normal, sclerae anicteric  ENMT: External Ears, Nose appear normal. Grossly normal hearing. Mucous membranes are moist.  Neck: Appears normal, supple, no cervical masses, normal ROM, no appreciable thyromegaly; no JVD Respiratory:  Diminished to auscultation bilaterally, no wheezing, rales, rhonchi or crackles. Normal respiratory effort and patient is not tachypenic. No accessory muscle use.  Cardiovascular: RRR, no murmurs / rubs / gallops. S1 and S2 auscultated. No extremity edema Abdomen: Soft, non-tender,Mildly distendeded. No masses palpated. No appreciable hepatosplenomegaly. Bowel sounds positive x4.  GU: Deferred. Musculoskeletal: No clubbing / cyanosis of digits/nails. No joint deformity upper and lower extremities.  Skin: No rashes, lesions, ulcers on a limited skin evaluation. No induration; Warm and dry.  Neurologic: CN 2-12 grossly intact with no focal deficits. Romberg sign and cerebellar reflexes not assessed.  Psychiatric: Normal judgment and insight. Alert and oriented x 3. Mildly Anxious and Depressed  mood and appropriate affect.   Data Reviewed: I have personally reviewed following labs and imaging studies  CBC: Recent Labs  Lab 07/25/18 0542 07/31/18 0927  WBC 10.2 10.3  NEUTROABS  --  9.1*  HGB 12.2* 11.2*  HCT 39.5 37.3*  MCV 102.1* 102.8*  PLT 353 271   Basic Metabolic Panel: Recent Labs  Lab 07/25/18 0542 07/27/18 0947 07/31/18 0927  NA 139 139 136  K 4.4 4.4 3.9  CL 105 103 99  CO2 25 26 26  GLUCOSE 96 161* 172*  BUN 16 15 14  CREATININE 0.51* 0.78 0.64  CALCIUM 9.9 9.9 9.4  MG  --  2.1 2.0  PHOS  --  2.8 2.4*   GFR: Estimated Creatinine Clearance: 122.7 mL/min (by C-G formula based on SCr of 0.64 mg/dL). Liver Function Tests: Recent Labs  Lab 07/25/18 0542 07/27/18 0947 07/31/18 0927  AST 14* 12* 11*  ALT 17 17 15  ALKPHOS 105 123 140*  BILITOT 0.5 0.3 0.5  PROT 6.9 6.7 6.4*  ALBUMIN 2.7* 2.7* 2.4*   No results for input(s): LIPASE, AMYLASE in the last 168 hours. No results for input(s): AMMONIA in the last 168 hours. Coagulation Profile: No results for input(s): INR, PROTIME in the last 168 hours. Cardiac Enzymes: No results for input(s): CKTOTAL, CKMB, CKMBINDEX, TROPONINI in the last 168 hours. BNP (last 3 results) No results for input(s): PROBNP in the last 8760 hours. HbA1C: No results for input(s): HGBA1C in the last 72 hours. CBG: No results for input(s): GLUCAP in the last 168 hours. Lipid Profile: No results for input(s): CHOL, HDL, LDLCALC, TRIG, CHOLHDL, LDLDIRECT in the last 72 hours. Thyroid Function Tests: No results for input(s): TSH, T4TOTAL, FREET4, T3FREE, THYROIDAB in the last 72 hours. Anemia Panel: No results for input(s): VITAMINB12, FOLATE, FERRITIN, TIBC, IRON, RETICCTPCT in the last 72 hours. Sepsis Labs: No results for input(s): PROCALCITON, LATICACIDVEN in the last 168 hours.  No results found for this or any previous visit (from the past 240 hour(s)).       Estimated body mass index is 22.22 kg/m as  calculated from the following:   Height as of this encounter: 6' 2" (1.88 m).   Weight as of this encounter: 78.5 kg.  Malnutrition Type:  Nutrition Problem: Increased nutrient needs Etiology: cancer and cancer related treatments   Malnutrition Characteristics:  Signs/Symptoms: estimated needs   Nutrition Interventions:  Interventions: Ensure Enlive (each supplement provides 350kcal and 20 grams of protein), Boost Breeze    Radiology Studies: No results found.  Scheduled Meds: . bisacodyl  10 mg Rectal Once  . dexamethasone (DECADRON) injection  4 mg Intravenous BID  . DULoxetine  30 mg Oral Daily  . enoxaparin (LOVENOX) injection  40 mg Subcutaneous Daily  . feeding supplement  1 Container   Oral TID BM  . fentaNYL  1 patch Transdermal Q72H  . pantoprazole  40 mg Oral Daily  . sodium chloride flush  3 mL Intravenous Q12H   Continuous Infusions: . sodium chloride 100 mL/hr at 07/31/18 1342    LOS: 10 days   Omair Latif Sheikh, DO [facility] PAGER is on AMION  If 7PM-7AM, please contact night-coverage [email or web address] Password [email or web address] 07/31/2018, 3:52 PM  

## 2018-07-31 NOTE — Telephone Encounter (Signed)
Received call from patient fiance [name] requesting results from most recent MRI's. Explained that the MRI's listed have not been ordered by Dr. [name] and that the patient did not list anyone on his release of information meaning we cannot share his medical information with anyone. She verbalized understanding.

## 2018-07-31 NOTE — Progress Notes (Signed)
Patient refused dulcolax suppository wants to wait til about 0900 in the morning. Time changed on the [facility]

## 2018-07-31 NOTE — Progress Notes (Signed)
Palliative:  HPI:48 yo male with PMH significant of bladder tumor, prostate cancer s/p robotic assisted cystoprostatectomy, bradycardia, anxiety, depression admitted 07/20/18 with worsening back pain without any trauma/falls with plans for biopsy of left iliac crest for 07/23/18. LBM 6 days PTA and no relief of pain with use of home opioids, steroids, and muscle relaxer. Hospitalization has been complicated by continued poorly controlled pain.Pain slowly improving with adjustments. Beginning radiation therapy and hoping this improves pain as well.Making progress with pain management but still struggling with constipation.    I met again today with Rodney [name] after receiving update from RN and chaplain. Rodney [name] continues with pain but is much improved since admission. He continues to have constipation and has not had a BM in 5 days. He has failed miralax, senokot, lactulose, relistor. SMOG enema has so far been the only success to help him have BM. He is also not very mobile and mainly in bed. He plans to have attempts at more activity. We will try dulcolax suppository this evening.   We discussed his pain and the benefits vs consequences of increasing fentanyl patch. Rodney [name] feels that he is receiving adequate pain relief and does not desire to increase pain medication as he does not want to feel overly sleepy or groggy. We discussed trying flexeril at night to assist with comfort and sleep and he tells me he tried this last night but felt this made him too groggy - will cut dose in half.   Rodney [name] has some questions about his cancer. He is mainly confused about the cancer in his bone and I explained that his biopsy was positive for urothelial cancer and this has spread into his spine but originated from his bladder. He told me that this finally makes sense to him. I understand that his family (fiance and daughter) have been calling and trying to get answers as well. I asked Rodney [name] if he wished for [place] to call them but  he said he would call them and share as soon as I leave. I also encouraged that we can always get them over speaker phone for future visits and he will consider this options.   All questions/conncers addressed. Emotional support provided.   Exam: Alert, oriented. No distress. Breathing, regular, unlabored. Abd slightly distended and tender. No edema. Moves all extremities.   Plan:   New chest "pain" or "tightness" improved with belching although he has had a run of VT per RN notes. This was improved 7/13 and no further complaints.  ? Continue cardiac monitoring.  ? Protonix 40 mg daily.  ? Maalox/Mylanta prn.  Acute on chronic RUQ pain with severe radiation to back and across upper abdomen: ? Fentanyl 100 mcg/hr. ? Dilaudid po 2 mg every 3 hours prn. ? Continue decadron. ? Flexeril 2.5 mg po TID prn.Decreased d/t too strong per patient.  ? Cymbalta 30 mg po daily.  Constipation: ? Relistor SQ injection given 7/12 and 7/14 with no results.  ? Dulcolax suppository x 1 today.  ? If no results may repeat SMOG enema tomorrow.  ? Will need plan for constipation management that can be effective at home.   [place], NP Palliative Medicine Team Pager [phone] (Please see [email or web address] for schedule) Team Phone [phone]    Greater than 50%  of this time was spent counseling and coordinating care related to the above assessment and plan

## 2018-07-31 NOTE — Progress Notes (Signed)
   07/31/18 1443  Clinical Encounter Type  Visited With Patient  Visit Type Initial;Spiritual support  Referral From Palliative care team  Consult/Referral To Chaplain  Spiritual Encounters  Spiritual Needs Prayer;Sacred text  This chaplain introduced herself to the Pt. with an invitation for a spiritual care visit.  The chaplain was pastorally present and listened as the Pt. described his pain associated with movement and waiting for a bowel movement. The Pt. positively described his relationship with his fiancee-Wendy and daughter-Heather.  The Pt. verbalized his desire to have medical updates shared with [name] and Nira Conn to help the Pt. remember the conversations with the healthcare team. The chaplain heard the Pt. acknowledge his current radiation and plans for chemotherapy. The Pt. shared [name] and Nira Conn, along with his brothers will support him at home at the right time. The chaplain listened to the Pt. faith story and continued support/prayers from his congregation. After prayer with the Pt., the Pt. accepted F/U spiritual care as needed.

## 2018-07-31 NOTE — Progress Notes (Signed)
Patients daughter called and wants to know which doctor ordered MRI and the results of the MRI, This Probation officer explained to daughter Nira Conn) that patient is requesting that nurse wait until he speaks with the doctor before speaking with family. Daughter verbalized understanding.

## 2018-07-31 NOTE — Progress Notes (Signed)
[facility] Therapy Treatment Record Phone [phone]   Radiation Therapy was administered to Rodney [name] on: 07/31/2018  4:04 PM and was treatment # 6 out of a planned course of 10 treatments.  Radiation Treatment  1). Beam photons with 6-10 energy  2). Brachytherapy None  3). Stereotactic Radiosurgery None  4). Other Radiation None     Shereen Marton A Anayla Giannetti, RT (T)

## 2018-08-01 ENCOUNTER — Ambulatory Visit
Admit: 2018-08-01 | Discharge: 2018-08-01 | Disposition: A | Discharge status: Home / Self Care | Payer: PRIVATE HEALTH INSURANCE | Attending: Radiation Oncology | Admitting: Radiation Oncology

## 2018-08-01 ENCOUNTER — Telehealth: Payer: Self-pay

## 2018-08-01 DIAGNOSIS — K5903 Drug induced constipation: Secondary | ICD-10-CM

## 2018-08-01 DIAGNOSIS — C799 Secondary malignant neoplasm of unspecified site: Secondary | ICD-10-CM

## 2018-08-01 LAB — CBC WITH DIFFERENTIAL/PLATELET
Abs Immature Granulocytes: 0.12 10*3/uL — ABNORMAL HIGH (ref 0.00–0.07)
Basophils Absolute: 0 10*3/uL (ref 0.0–0.1)
Basophils Relative: 0 %
Eosinophils Absolute: 0 10*3/uL (ref 0.0–0.5)
Eosinophils Relative: 0 %
HCT: 38.8 % — ABNORMAL LOW (ref 39.0–52.0)
Hemoglobin: 11.8 g/dL — ABNORMAL LOW (ref 13.0–17.0)
Immature Granulocytes: 1 %
Lymphocytes Relative: 4 %
Lymphs Abs: 0.5 10*3/uL — ABNORMAL LOW (ref 0.7–4.0)
MCH: 30.7 pg (ref 26.0–34.0)
MCHC: 30.4 g/dL (ref 30.0–36.0)
MCV: 101 fL — ABNORMAL HIGH (ref 80.0–100.0)
Monocytes Absolute: 1 10*3/uL (ref 0.1–1.0)
Monocytes Relative: 9 %
Neutro Abs: 9 10*3/uL — ABNORMAL HIGH (ref 1.7–7.7)
Neutrophils Relative %: 86 %
Platelets: 238 10*3/uL (ref 150–400)
RBC: 3.84 MIL/uL — ABNORMAL LOW (ref 4.22–5.81)
RDW: 12.1 % (ref 11.5–15.5)
WBC: 10.6 10*3/uL — ABNORMAL HIGH (ref 4.0–10.5)
nRBC: 0 % (ref 0.0–0.2)

## 2018-08-01 LAB — COMPREHENSIVE METABOLIC PANEL
ALT: 17 U/L (ref 0–44)
AST: 11 U/L — ABNORMAL LOW (ref 15–41)
Albumin: 2.6 g/dL — ABNORMAL LOW (ref 3.5–5.0)
Alkaline Phosphatase: 151 U/L — ABNORMAL HIGH (ref 38–126)
Anion gap: 11 (ref 5–15)
BUN: 15 mg/dL (ref 6–20)
CO2: 27 mmol/L (ref 22–32)
Calcium: 10 mg/dL (ref 8.9–10.3)
Chloride: 101 mmol/L (ref 98–111)
Creatinine, Ser: 0.53 mg/dL — ABNORMAL LOW (ref 0.61–1.24)
GFR calc Af Amer: >60 mL/min (ref >60)
GFR calc non Af Amer: >60 mL/min (ref >60)
Glucose, Bld: 118 mg/dL — ABNORMAL HIGH (ref 70–99)
Potassium: 4.5 mmol/L (ref 3.5–5.1)
Sodium: 139 mmol/L (ref 135–145)
Total Bilirubin: 0.6 mg/dL (ref 0.3–1.2)
Total Protein: 7 g/dL (ref 6.5–8.1)

## 2018-08-01 LAB — PHOSPHORUS: Phosphorus: 3.8 mg/dL (ref 2.5–4.6)

## 2018-08-01 LAB — MAGNESIUM: Magnesium: 2.4 mg/dL (ref 1.7–2.4)

## 2018-08-01 MED ORDER — SENNOSIDES-DOCUSATE SODIUM 8.6-50 MG PO TABS
2.0000 | ORAL_TABLET | Freq: Two times a day (BID) | ORAL | Status: DC
  Administered 2018-08-02: 2 via ORAL
  Filled 2018-08-01: qty 2

## 2018-08-01 MED ORDER — SORBITOL 70 % SOLN
960.0000 mL | TOPICAL_OIL | Freq: Once | ORAL | Status: AC
  Administered 2018-08-01: 960 mL via RECTAL
  Filled 2018-08-01: qty 473

## 2018-08-01 NOTE — Telephone Encounter (Signed)
Received multiple messages from patient daughter, fiance, and patient requesting information about patient diagnosis and scan results. Contacted patient and explained that all this information has been provided to him by Dr. [name] so it is up to him to provide this information to his family members. Discussed his appointment scheduled for 7/20 at 10 am and that he can have both his daughter and fiance on speaker phone if he so desires so they can hear all the information provided at that time since we still have visitor restrictions. Also encouraged him to update his release of information when he registers for his appointment so when his daughter or fiance call we can provide them with information. Patient verbalized understanding, was appreciative of the call, and had no other questions or concerns.

## 2018-08-01 NOTE — Progress Notes (Signed)
Daily Progress Note   Patient Name: Rodney [name]       Date: 08/01/2018 DOB: [birth year]  Age: 48 y.o. MRN#: [id number] Attending Physician: Kerney Elbe, DO Primary Care Physician: Curlene Labrum, MD Admit Date: 07/20/2018  Reason for Consultation/Follow-up: Pain control  Subjective: I met today with Rodney [name].  I know him from prior encounters last week.  He is much more comfortable during examination today.  We discussed his pain regimen and he reports that current regimen of fentanyl patch with breakthrough p.o. Dilaudid has been working well for him.  States that his pain does not get down to a 0, but he would not expect it to.  He thinks he will be able to function at this level.  We discussed that options moving forward would likely include increasing his rescue medication slightly if needed.  Based upon his fentanyl patch dose, it would not surprise me if he would do better with 4 mg of rescue Dilaudid as needed.  At this time, he reports his pain is fairly well controlled and he is in agreement with continue with current regimen as increasing narcotics will also likely make his constipation worse.  We discussed his constipation.  He has been taking oral regimen and had good results before with a smog enema.  Plan for him to receive another enema today after going for radiation.  We talked about his overall diagnosis and care plan moving forward.  He reports having better understanding of his disease after discussion with Dr. [name] today.  He reports having good understanding of next steps and that he has been talking with his fiance and daughter about his diagnosis, prognosis, and care plan.  Length of Stay: 11  Current Medications: Scheduled Meds:  . dexamethasone  (DECADRON) injection  4 mg Intravenous BID  . DULoxetine  30 mg Oral Daily  . enoxaparin (LOVENOX) injection  40 mg Subcutaneous Daily  . feeding supplement  1 Container Oral TID BM  . fentaNYL  1 patch Transdermal Q72H  . pantoprazole  40 mg Oral Daily  . polyethylene glycol  17 g Oral BID  . senna-docusate  2 tablet Oral BID  . sodium chloride flush  3 mL Intravenous Q12H    Continuous Infusions:   PRN Meds: acetaminophen **OR** acetaminophen, albuterol, alum &  mag hydroxide-simeth, cyclobenzaprine, HYDROmorphone (DILAUDID) injection, HYDROmorphone, ondansetron **OR** ondansetron (ZOFRAN) IV  Physical Exam         General: Alert, awake, in no acute distress.  HEENT: No bruits, no goiter, no JVD Heart: Regular rate and rhythm. No murmur appreciated. Lungs: Good air movement, clear Abdomen: Soft, nontender, nondistended, positive bowel sounds.  Ext: No significant edema Skin: Warm and dry Neuro: Grossly intact, nonfocal.  Vital Signs: BP 108/84 (BP Location: Left Arm)   Pulse (!) 118   Temp 97.8 F (36.6 C) (Oral)   Resp 18   Ht 6' 2"  (1.88 m)   Wt 78.5 kg   SpO2 98%   BMI 22.22 kg/m  SpO2: SpO2: 98 % O2 Device: O2 Device: Nasal Cannula O2 Flow Rate: O2 Flow Rate (L/min): 2 L/min  Intake/output summary:   Intake/Output Summary (Last 24 hours) at 08/01/2018 1638 Last data filed at 08/01/2018 1546 Gross per 24 hour  Intake 2977.57 ml  Output 6525 ml  Net -3547.43 ml   LBM: Last BM Date: 08/01/18 Baseline Weight: Weight: 83.5 kg Most recent weight: Weight: 78.5 kg       Palliative Assessment/Data:    Flowsheet Rows     Most Recent Value  Intake Tab  Referral Department  Hospitalist  Unit at Time of Referral  Med/Surg Unit  Palliative Care Primary Diagnosis  Cancer  Date Notified  07/21/18  Palliative Care Type  New Palliative care  Reason for referral  Pain  Date of Admission  07/20/18  # of days IP prior to Palliative referral  1  Clinical  Assessment  Palliative Performance Scale Score  60%  Psychosocial & Spiritual Assessment  Palliative Care Outcomes      Patient Active Problem List   Diagnosis Date Noted  . 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 Pain   . Metastatic cancer ([place])   . Palliative care encounter   . Intractable pain 07/20/2018  . Constipation 07/20/2018  . Acute lower UTI 07/20/2018  . Leukocytosis 07/20/2018  . 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 Bladder cancer ([place]) 11/08/2017  . Genetic testing 10/26/2017  . Family history of prostate cancer   . Family history of gastric cancer   . Family history of melanoma   . Family history of colon cancer   . Urothelial cancer ([place])   . Malignant neoplasm of prostate ([place]) 09/11/2017    Palliative Care Assessment & Plan   Patient Profile: 48 year old male with past medical history significant for bladder tumor, prostate cancer, anxiety, bradycardia, and depression admitted with worsening back pain and found to have widely metastatic disease.  He is status post biopsy with insufficient tissue and repeat iliac crest biopsy this a.m. he has recently started on radiation therapy.  Assessment: Patient Active Problem List   Diagnosis Date Noted  . 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 Pain   . Metastatic cancer ([place])   . Palliative care encounter   . Intractable pain 07/20/2018  . Constipation 07/20/2018  . Acute lower UTI 07/20/2018  . Leukocytosis 07/20/2018  . 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 Bladder cancer ([place]) 11/08/2017  . Genetic testing 10/26/2017  . Family history of prostate cancer   . Family history of gastric cancer   . Family history of melanoma   . Family history of  colon cancer   . Urothelial cancer ([place])   .  Malignant neoplasm of prostate ([place]) 09/11/2017   Recommendations/Plan:  Pain: Combination of somatic and visceral components.  He reports better control on current regimen.  Would recommend continue fentanyl 100 mcg/h patch with rescue medication of Dilaudid p.o. 2 mg every 3 hours as needed.  If this is insufficient to control his pain, it would be reasonable to increase this to Dilaudid p.o. 4 mg every 3 hours as needed.  Constipation: Opioid related.  Plan for smog enema today.  Agree with continuation of MiraLAX twice daily.  I also increased his senna S to 2 tabs twice daily.  Provided emotional support and answered all questions the best my ability.  Goals of Care and Additional Recommendations:  Limitations on Scope of Treatment: Full Scope Treatment  Code Status:    Code Status Orders  (From admission, onward)         Start     Ordered   07/22/18 1559  Full code  Continuous     07/22/18 1558        Code Status History    Date Active Date Inactive Code Status Order ID Comments User Context   07/22/2018 1327 07/22/2018 1558 DNR [id number]  Georgette Shell, MD Inpatient   07/20/2018 2217 07/22/2018 1327 Full Code [id number]  Norval Morton, MD ED   11/08/2017 1740 11/14/2017 1823 Full Code [id number]  Raynelle Bring, MD Inpatient   Advance Care Planning Activity       Prognosis:   Unable to determine  Discharge Planning:  Home once medically ready.    Care plan was discussed with patient  Thank you for allowing the Palliative Medicine Team to assist in the care of this patient.   Time In: 1350 Time Out: 1440 Total Time 40 Prolonged Time Billed No      Greater than 50%  of this time was spent counseling and coordinating care related to the above assessment and plan.  Micheline Rough, MD  Please contact Palliative Medicine Team phone at [phone] for questions and concerns.

## 2018-08-01 NOTE — Progress Notes (Signed)
PROGRESS NOTE    Rodney [name]  NAT:[id number] DOB: 09/18/1970 DOA: 07/20/2018 PCP: Curlene Labrum, MD   Brief Narrative:  The patient is a 48 year old gentleman with history of bladder tumor, prostate cancer status post robotic assisted cystoprostatectomy, anxiety and depression who presented to the hospital with severe pain in his back over last week.  Patient recently had worsening pain on his hip and was found to have left iliac crest lesion.  On further imaging studies, he was found to have diffuse progression of bony mets throughout the spine, large medial left iliac wing metastasis with extraosseous extension of tumor.  Patient is admitted to the hospital because of severe pain, for radiation treatment and pain management.  Remains in the hospital with severe metastatic cancer pain and hospitalization has been complicated by constipation. Palliative involved in Pain Control and currently continuing to get XRT while hospitalized.  Patient's pain is improved but still has not had a bowel movement and will try smog enema today after radiation as he refusesd his suppository this morning.  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  Followed by palliative care for pain control.   -MRI done on 07/20/2018 showed widespread spinal and partially visible rib and pelvic metastasis along with epidural and or neuroforaminal extension of the tumor at T3, T4, T6, T8, L1, L3, L5 but no malignant spinal stenosis or cord compression noted.  There is multiple  metastasis to the spine that are slightly larger since the MRI last month but no pathological vertebral fracture.  No intradural metastasis is noted but there was a large medial left iliac wing metastasis with extraosseous extension the tumor is substantially increased since 07/07/2018  -Patient transferred to [facility] long for XRT.  -Status post bone marrow biopsy on 07/22/2018 from his left iliac crest that shows metastatic urothelial cancer.  -He also has bony lesion on his left tibia and it is painful and would not allow me to palpate.  -Currently remains on Fentanyl patch with increasing dose to 100 mcg/h, Decadron 4 mg IV BID and IV and or continue with cyclobenzaprine 5 mg al Dilaudid.  Appreciate palliative care help in managing Pain -Continue with cyclobenzaprine 5 mg p.o. 3 times daily as needed for muscle spasms -Continue with duloxetine 30 mg p.o. daily -Poor response to stool softener and laxatives.  Was given 2 doses of Methylnaltrexone with no Response; C/w Bisacodyl Suppository 10 mg x1 (refused); Bisacodyl 5 mg po Dailyprn D/C'd and will add Miralax 17 g BID and Senna-Docusate 1 tab po BID; will try SMOG Enema -Followed by oncology and radiation therapy.   -Patient is receiving radiation treatment.  Continue IV fluids but reduced rate as below . -Also has painful lesion left tibia, for XRT but has not been done and patient needs to discuss with Rad Onc . -Pain is limiting his ambulation will get PT and OT to evaluate and treat; continue to ambulate and advance activities as tolerated -Per Oncology has a Follow Up on July 20th to discuss Systemic Chemotherapy that will start shortly after Radiation  Therapy   Severe Opioid Induced Constipation -Currently remains on fentanyl patch with increasing dose to 100 mcg/h, Decadron and IV and oral Dilaudid.  -Poor response to stool softener and laxatives.  Was given 2 doses of Methylnaltrexone with no Response; C/w Bisacodyl Suppository 10 mg x1  (refused); Bisacodyl 5 mg po Dailyprn D/C'd and will add Miralax 17 g BID and Senna-Docusate 1 tab po BID; will try SMOG Enema  Abnormal Urinalysis -Patient was started on Rocephin on 07/20/2018 with abnormal urine. -Unfortunately urine culture was not sent.   -Does not have any urinary symptoms today.   -Antibiotics have since been discontinued.  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 -Continue to monitor for signs and symptoms of bleeding; currently no overt bleeding noted -Repeat CBC in a.m.  Hyperglycemia -In the setting of IV steroid demargination from dexamethasone -Continue monitor and trend blood sugars carefully; Daily Blood Sugars ranging from 96-161 -Obtain hemoglobin A1c in the morning to rule out diabetes component  Anxiety and Depression -C/w Duloxetine 30 mg po Daily   GERD/Heartburn -C/w Pantoprazole 40 mg po Daily   Leukocytosis -Mild at 10.6 -In the setting of IV steroid demargination -Continue monitor for signs and symptoms of infection -Repeat CBC in a.m.  DVT prophylaxis: Enoxaparin 40 mg sq Daily  Code Status: FULL CODE Family Communication: No family present at bedside  Disposition Plan: Pending Improvement in Pain with transition to po Regimenand Bowel Movement; Possibly in the next 24-48 hours   Consultants:   Medical Oncology  Radiation Oncology  Palliative Care Medicine   Procedures: XRT  Bone marrow biopsy left iliac crest, 07/22/2018  Antimicrobials:  Anti-infectives (From admission, onward)   Start     Dose/Rate Route Frequency Ordered Stop   07/20/18 2200   cefTRIAXone (ROCEPHIN) 1 g in sodium chloride 0.9 % 100 mL IVPB  Status:  Discontinued     1 g 200 mL/hr over 30 Minutes Intravenous Every 24 hours 07/20/18 2225 07/25/18 0853   07/20/18 1915  cefTRIAXone (ROCEPHIN) 1 g in sodium chloride 0.9 % 100 mL IVPB     1 g 200 mL/hr over 30 Minutes Intravenous  Once 07/20/18 1903 07/20/18 2239     Subjective: Seen and examined at bedside and states his pain remains fairly well-controlled still however states that it is worse when he moves or ambulates.  Was questioning about why he is not getting radiation therapy to his left leg.  No nausea or vomiting.  Still complaining of not having a proper bowel movement 6 days.  Refused to suppository this morning and willing to try smog enema after his radiation therapy.  No other concerns or complaints at this time.  Objective: Vitals:   07/30/18 2120 07/31/18 0429 07/31/18 1459 07/31/18 2101  BP: 128/86 133/81 133/81 133/83  Pulse: 95 91 (!) 102 95  Resp: 19 19 19 18   Temp: 98 F (36.7 C) 97.7 F (36.5 C) 97.7 F (36.5 C) 97.8 F (36.6 C)  TempSrc: Oral Oral Oral Oral  SpO2: 99% 100% 100% 98%  Weight:      Height:  Intake/Output Summary (Last 24 hours) at 08/01/2018 1156 Last data filed at 08/01/2018 1000 Gross per 24 hour  Intake 3210.57 ml  Output 5025 ml  Net -1814.43 ml   Filed Weights   07/20/18 1019 07/29/18 0431  Weight: 83.5 kg 78.5 kg   Examination: Physical Exam:  Constitutional: Thin chronically ill-appearing Caucasian male currently no acute distress appears calm but still does appear somewhat uncomfortable Eyes: Lids and conjunctive are normal.  Sclera anicteric ENMT: External ears nose appear normal.  Grossly normal hearing.  Extremities are moist Neck: Appears supple no JVD Respiratory: Diminished auscultation bilaterally with no appreciable wheezing, rales, rhonchi.  Patient not tachypneic or using any accessory muscles to breathe Cardiovascular: Regular rate and  rhythm.  No appreciable murmurs, rubs, gallops.  No lower extremity edema noted but does have pain on palpation on the left leg Abdomen: Soft, nontender, mildly distended.  Bowel sounds present GU: Deferred Musculoskeletal: No contractures or cyanosis.  No joint deformities in the upper or lower extremities noted Skin: No appreciable rashes or lesions on to skin evaluation.  Skin is warm and dry Neurologic: Cranial nerves II through XII grossly intact no appreciable focal deficits.  Romberg sign cerebellar reflexes were not assessed Psychiatric: Has a normal mood and affect.  Intact judgment insight.  Not as anxious today but still does have a slightly depressed appearing mood.  Data Reviewed: I have personally reviewed following labs and imaging studies  CBC: Recent Labs  Lab 07/31/18 0927 08/01/18 0520  WBC 10.3 10.6*  NEUTROABS 9.1* 9.0*  HGB 11.2* 11.8*  HCT 37.3* 38.8*  MCV 102.8* 101.0*  PLT 271 716   Basic Metabolic Panel: Recent Labs  Lab 07/27/18 0947 07/31/18 0927 08/01/18 0520  NA 139 136 139  K 4.4 3.9 4.5  CL 103 99 101  CO2 26 26 27   GLUCOSE 161* 172* 118*  BUN 15 14 15   CREATININE 0.78 0.64 0.53*  CALCIUM 9.9 9.4 10.0  MG 2.1 2.0 2.4  PHOS 2.8 2.4* 3.8   GFR: Estimated Creatinine Clearance: 122.7 mL/min (A) (by C-G formula based on SCr of 0.53 mg/dL (L)). Liver Function Tests: Recent Labs  Lab 07/27/18 0947 07/31/18 0927 08/01/18 0520  AST 12* 11* 11*  ALT 17 15 17   ALKPHOS 123 140* 151*  BILITOT 0.3 0.5 0.6  PROT 6.7 6.4* 7.0  ALBUMIN 2.7* 2.4* 2.6*   No results for input(s): LIPASE, AMYLASE in the last 168 hours. No results for input(s): AMMONIA in the last 168 hours. Coagulation Profile: No results for input(s): INR, PROTIME in the last 168 hours. Cardiac Enzymes: No results for input(s): CKTOTAL, CKMB, CKMBINDEX, TROPONINI in the last 168 hours. BNP (last 3 results) No results for input(s): PROBNP in the last 8760 hours. HbA1C: No  results for input(s): HGBA1C in the last 72 hours. CBG: No results for input(s): GLUCAP in the last 168 hours. Lipid Profile: No results for input(s): CHOL, HDL, LDLCALC, TRIG, CHOLHDL, LDLDIRECT in the last 72 hours. Thyroid Function Tests: No results for input(s): TSH, T4TOTAL, FREET4, T3FREE, THYROIDAB in the last 72 hours. Anemia Panel: No results for input(s): VITAMINB12, FOLATE, FERRITIN, TIBC, IRON, RETICCTPCT in the last 72 hours. Sepsis Labs: No results for input(s): PROCALCITON, LATICACIDVEN in the last 168 hours.  No results found for this or any previous visit (from the past 240 hour(s)).       Estimated body mass index is 22.22 kg/m as calculated from the following:   Height as of this encounter:  6' 2"  (1.88 m).   Weight as of this encounter: 78.5 kg.  Malnutrition Type:  Nutrition Problem: Increased nutrient needs Etiology: cancer and cancer related treatments   Malnutrition Characteristics:  Signs/Symptoms: estimated needs   Nutrition Interventions:  Interventions: Ensure Enlive (each supplement provides 350kcal and 20 grams of protein), Boost Breeze    Radiology Studies: No results found.  Scheduled Meds:  dexamethasone (DECADRON) injection  4 mg Intravenous BID   DULoxetine  30 mg Oral Daily   enoxaparin (LOVENOX) injection  40 mg Subcutaneous Daily   feeding supplement  1 Container Oral TID BM   fentaNYL  1 patch Transdermal Q72H   pantoprazole  40 mg Oral Daily   polyethylene glycol  17 g Oral BID   senna-docusate  1 tablet Oral BID   sodium chloride flush  3 mL Intravenous Q12H   Continuous Infusions:  sodium chloride 50 mL/hr at 08/01/18 0758    LOS: 11 days   Kerney Elbe, DO [facility] PAGER is on AMION  If 7PM-7AM, please contact night-coverage [email or web address] Password [facility] 08/01/2018, 11:56 AM

## 2018-08-01 NOTE — Progress Notes (Signed)
IP PROGRESS NOTE  Subjective:   Rodney [name] reports improvement in his pain although he still requiring intravenous breakthrough pain medication in addition to fentanyl.  He continues to have shin pain however.  His appetite has been improving.  He denies any shortness of breath or difficulty breathing.  He denies any cough or wheezing.  Objective:  Vital signs in last 24 hours: Temp:  [97.7 F (36.5 C)-97.8 F (36.6 C)] 97.8 F (36.6 C) (07/15 2101) Pulse Rate:  [95-102] 95 (07/15 2101) Resp:  [18-19] 18 (07/15 2101) BP: (133)/(81-83) 133/83 (07/15 2101) SpO2:  [98 %-100 %] 98 % (07/15 2101) Weight change:  Last BM Date: 07/27/18  Intake/Output from previous day: 07/15 0701 - 07/16 0700 In: 2309.3 [P.O.:946; I.V.:1363.3] Out: 5825 [Urine:5825]    General appearance: Alert, awake without any distress. Head: Atraumatic without abnormalities Oropharynx: Without any thrush or ulcers. Eyes: No scleral icterus. Lymph nodes: No lymphadenopathy noted in the cervical, supraclavicular, or axillary nodes Heart:regular rate and rhythm, without any murmurs or gallops.   Lung: Clear to auscultation without any rhonchi, wheezes or dullness to percussion. Abdomin: Soft, nontender without any shifting dullness or ascites. Musculoskeletal: No clubbing or cyanosis. Neurological: No motor or sensory deficits. Skin: No rashes or lesions.  Tender to touch over his left shin     Lab Results: Recent Labs    07/31/18 0927 08/01/18 0520  WBC 10.3 10.6*  HGB 11.2* 11.8*  HCT 37.3* 38.8*  PLT 271 238    BMET Recent Labs    07/31/18 0927 08/01/18 0520  NA 136 139  K 3.9 4.5  CL 99 101  CO2 26 27  GLUCOSE 172* 118*  BUN 14 15  CREATININE 0.64 0.53*  CALCIUM 9.4 10.0    Studies/Results: No results found.  Medications: I have reviewed the patient's current medications.  Assessment/Plan:  48 year old with:  1.    Advanced malignancy with disease to the bone that is  biopsy-proven likely of a bladder primary.     The disease process was reviewed again with the patient as well as treatment plan.  He will have follow-up on July 20 to discuss systemic chemotherapy which will commence shortly after completing radiation therapy.  Will likely require palliative chemotherapy to control his disease and his symptoms.  2.  Bone pain: Improved at this time currently on long-acting fentanyl patch although still requiring intravenous pain medication.  I anticipate improvement with his pain with radiation therapy.  3.    Disposition: I have no objections to discharge medically stable in the next 24 hours.  Follow-up is already set up for July 20 the outpatient setting.  25  minutes was spent with the patient face-to-face today.  More than 50% of the time was spent reviewing his disease status, treatment options as well as future plan of care.      LOS: 11 days   Zola Button 08/01/2018, 11:11 AM

## 2018-08-01 NOTE — Evaluation (Signed)
Physical Therapy Evaluation Patient Details Name: Rodney [name] MRN: [id number] DOB: 11-19-1970 Today's Date: 08/01/2018   History of Present Illness  Pt admitted with intractable pain 2* metastatic CA and with hx of prostate and bladder CA as well as back surgery x 2.  Clinical Impression  Pt admitted as above and presenting with functional mobility limitations 2* generalized weakness, pain and ambulatory balance deficits.  Pt should progress to dc home with family assist.    Follow Up Recommendations Home health PT    Equipment Recommendations  None recommended by PT    Recommendations for Other Services       Precautions / Restrictions Precautions Precautions: Fall Restrictions Weight Bearing Restrictions: No      Mobility  Bed Mobility Overal bed mobility: Modified Independent             General bed mobility comments: Pt to EOB sitting unassisted  Transfers Overall transfer level: Needs assistance Equipment used: Rolling walker (2 wheeled) Transfers: Sit to/from Stand Sit to Stand: Min assist;From elevated surface         General transfer comment: pt self cues for use of UEs to self assist.  Min assist to bring wt up and fwd and to balance in initial standing  Ambulation/Gait Ambulation/Gait assistance: Min assist Gait Distance (Feet): 140 Feet Assistive device: Rolling walker (2 wheeled) Gait Pattern/deviations: Step-to pattern;Step-through pattern;Decreased step length - right;Decreased step length - left;Shuffle;Trunk flexed Gait velocity: decr   General Gait Details: cues for posture and position from [employer]            Wheelchair Mobility    Modified Rankin (Stroke Patients Only)       Balance Overall balance assessment: Needs assistance Sitting-balance support: No upper extremity supported;Feet supported Sitting balance-Leahy Scale: Good     Standing balance support: Bilateral upper extremity supported Standing balance-Leahy  Scale: Poor                               Pertinent Vitals/Pain Pain Assessment: 0-10 Pain Score: 5  Pain Location: back Pain Descriptors / Indicators: Aching;Sore Pain Intervention(s): Limited activity within patient's tolerance;Monitored during session;Premedicated before session;Ice applied    Home Living Family/patient expects to be discharged to:: Private residence Living Arrangements: Spouse/significant other Available Help at Discharge: Family Type of Home: House Home Access: [place]: One [place]: Environmental consultant - 2 wheels;Bedside commode      Prior Function Level of Independence: Independent with assistive device(s)         Comments: useing RW at home     Hand Dominance        Extremity/Trunk Assessment   Upper Extremity Assessment Upper Extremity Assessment: Generalized weakness    Lower Extremity Assessment Lower Extremity Assessment: Generalized weakness       Communication   Communication: No difficulties  Cognition Arousal/Alertness: Awake/alert Behavior During Therapy: WFL for tasks assessed/performed Overall Cognitive Status: Within Functional Limits for tasks assessed                                        General Comments      Exercises     Assessment/Plan    PT Assessment Patient needs continued PT services  PT Problem List Decreased strength;Decreased activity tolerance;Decreased balance;Decreased mobility;Decreased knowledge of use of DME;Pain  PT Treatment Interventions DME instruction;Gait training;Functional mobility training;Therapeutic activities;Therapeutic exercise;Patient/family education    PT Goals (Current goals can be found in the Care Plan section)  Acute Rehab PT Goals Patient Stated Goal: HOME PT Goal Formulation: With patient Time For Goal Achievement: 08/15/18 Potential to Achieve Goals: Good    Frequency Min 3X/week   Barriers to discharge         Co-evaluation               AM-PAC PT "6 Clicks" Mobility  Outcome Measure Help needed turning from your back to your side while in a flat bed without using bedrails?: None Help needed moving from lying on your back to sitting on the side of a flat bed without using bedrails?: None Help needed moving to and from a bed to a chair (including a wheelchair)?: A Little Help needed standing up from a chair using your arms (e.g., wheelchair or bedside chair)?: A Little Help needed to walk in hospital room?: A Little Help needed climbing 3-5 steps with a railing? : A Lot 6 Click Score: 19    End of Session Equipment Utilized During Treatment: Gait belt;Oxygen Activity Tolerance: Patient tolerated treatment well Patient left: in chair;with call bell/phone within reach Nurse Communication: Mobility status PT Visit Diagnosis: Difficulty in walking, not elsewhere classified (R26.2);Muscle weakness (generalized) (M62.81)    Time: 1120-1150 PT Time Calculation (min) (ACUTE ONLY): 30 min   Charges:   PT Evaluation $PT Eval Low Complexity: 1 Low PT Treatments $Gait Training: 8-22 mins        [place] Pager [phone] Office [phone]   Kamaron Deskins 08/01/2018, 2:45 PM

## 2018-08-02 ENCOUNTER — Ambulatory Visit
Admit: 2018-08-02 | Discharge: 2018-08-02 | Disposition: A | Discharge status: Home / Self Care | Payer: PRIVATE HEALTH INSURANCE | Attending: Radiation Oncology | Admitting: Radiation Oncology

## 2018-08-02 ENCOUNTER — Inpatient Hospital Stay (HOSPITAL_COMMUNITY): Payer: PRIVATE HEALTH INSURANCE

## 2018-08-02 DIAGNOSIS — N390 Urinary tract infection, site not specified: Secondary | ICD-10-CM

## 2018-08-02 LAB — COMPREHENSIVE METABOLIC PANEL
ALT: 17 U/L (ref 0–44)
AST: 11 U/L — ABNORMAL LOW (ref 15–41)
Albumin: 2.5 g/dL — ABNORMAL LOW (ref 3.5–5.0)
Alkaline Phosphatase: 151 U/L — ABNORMAL HIGH (ref 38–126)
Anion gap: 9 (ref 5–15)
BUN: 19 mg/dL (ref 6–20)
CO2: 31 mmol/L (ref 22–32)
Calcium: 9.9 mg/dL (ref 8.9–10.3)
Chloride: 100 mmol/L (ref 98–111)
Creatinine, Ser: 0.58 mg/dL — ABNORMAL LOW (ref 0.61–1.24)
GFR calc Af Amer: >60 mL/min (ref >60)
GFR calc non Af Amer: >60 mL/min (ref >60)
Glucose, Bld: 121 mg/dL — ABNORMAL HIGH (ref 70–99)
Potassium: 4.7 mmol/L (ref 3.5–5.1)
Sodium: 140 mmol/L (ref 135–145)
Total Bilirubin: 0.5 mg/dL (ref 0.3–1.2)
Total Protein: 6.9 g/dL (ref 6.5–8.1)

## 2018-08-02 LAB — MAGNESIUM: Magnesium: 2.3 mg/dL (ref 1.7–2.4)

## 2018-08-02 LAB — RETICULOCYTES
Immature Retic Fract: 12.4 % (ref 2.3–15.9)
RBC.: 3.8 MIL/uL — ABNORMAL LOW (ref 4.22–5.81)
Retic Count, Absolute: 49.4 10*3/uL (ref 19.0–186.0)
Retic Ct Pct: 1.3 % (ref 0.4–3.1)

## 2018-08-02 LAB — CBC WITH DIFFERENTIAL/PLATELET
Abs Immature Granulocytes: 0.1 10*3/uL — ABNORMAL HIGH (ref 0.00–0.07)
Basophils Absolute: 0 10*3/uL (ref 0.0–0.1)
Basophils Relative: 0 %
Eosinophils Absolute: 0 10*3/uL (ref 0.0–0.5)
Eosinophils Relative: 0 %
HCT: 38.3 % — ABNORMAL LOW (ref 39.0–52.0)
Hemoglobin: 11.9 g/dL — ABNORMAL LOW (ref 13.0–17.0)
Immature Granulocytes: 1 %
Lymphocytes Relative: 4 %
Lymphs Abs: 0.4 10*3/uL — ABNORMAL LOW (ref 0.7–4.0)
MCH: 31.3 pg (ref 26.0–34.0)
MCHC: 31.1 g/dL (ref 30.0–36.0)
MCV: 100.8 fL — ABNORMAL HIGH (ref 80.0–100.0)
Monocytes Absolute: 0.9 10*3/uL (ref 0.1–1.0)
Monocytes Relative: 9 %
Neutro Abs: 8.9 10*3/uL — ABNORMAL HIGH (ref 1.7–7.7)
Neutrophils Relative %: 86 %
Platelets: 274 10*3/uL (ref 150–400)
RBC: 3.8 MIL/uL — ABNORMAL LOW (ref 4.22–5.81)
RDW: 12.3 % (ref 11.5–15.5)
WBC: 10.3 10*3/uL (ref 4.0–10.5)
nRBC: 0 % (ref 0.0–0.2)

## 2018-08-02 LAB — PHOSPHORUS: Phosphorus: 3.3 mg/dL (ref 2.5–4.6)

## 2018-08-02 LAB — IRON AND TIBC
Iron: 43 ug/dL — ABNORMAL LOW (ref 45–182)
Saturation Ratios: 28 % (ref 17.9–39.5)
TIBC: 156 ug/dL — ABNORMAL LOW (ref 250–450)
UIBC: 113 ug/dL

## 2018-08-02 LAB — HEMOGLOBIN A1C
Hgb A1c MFr Bld: 6 % — ABNORMAL HIGH (ref 4.8–5.6)
Mean Plasma Glucose: 125.5 mg/dL

## 2018-08-02 LAB — VITAMIN B12: Vitamin B-12: 477 pg/mL (ref 180–914)

## 2018-08-02 LAB — FERRITIN: Ferritin: 1188 ng/mL — ABNORMAL HIGH (ref 24–336)

## 2018-08-02 LAB — FOLATE: Folate: 7.6 ng/mL (ref >5.9)

## 2018-08-02 MED ORDER — IOHEXOL 350 MG/ML SOLN
100.0000 mL | Freq: Once | INTRAVENOUS | Status: AC | PRN
  Administered 2018-08-02: 100 mL via INTRAVENOUS

## 2018-08-02 MED ORDER — DEXAMETHASONE 4 MG PO TABS: 4.0000 mg | ORAL_TABLET | Freq: Two times a day (BID) | ORAL | 0 refills | Status: AC

## 2018-08-02 MED ORDER — ONDANSETRON HCL 4 MG PO TABS: 4.0000 mg | ORAL_TABLET | Freq: Four times a day (QID) | ORAL | 0 refills | Status: DC | PRN

## 2018-08-02 MED ORDER — SODIUM CHLORIDE (PF) 0.9 % IJ SOLN
INTRAMUSCULAR | Status: AC
  Filled 2018-08-02: qty 50

## 2018-08-02 MED ORDER — HYDROMORPHONE HCL 2 MG PO TABS: 2.0000 mg | ORAL_TABLET | ORAL | 0 refills | Status: DC | PRN

## 2018-08-02 MED ORDER — PANTOPRAZOLE SODIUM 40 MG PO TBEC: 40.0000 mg | DELAYED_RELEASE_TABLET | Freq: Every day | ORAL | 0 refills | Status: DC

## 2018-08-02 MED ORDER — ACETAMINOPHEN 325 MG PO TABS: 650.0000 mg | ORAL_TABLET | Freq: Four times a day (QID) | ORAL | 0 refills | Status: DC | PRN

## 2018-08-02 MED ORDER — DULOXETINE HCL 30 MG PO CPEP: 30.0000 mg | ORAL_CAPSULE | Freq: Every day | ORAL | 0 refills | Status: DC

## 2018-08-02 MED ORDER — FENTANYL 100 MCG/HR TD PT72: 1.0000 | MEDICATED_PATCH | TRANSDERMAL | 0 refills | Status: DC

## 2018-08-02 MED ORDER — SENNOSIDES-DOCUSATE SODIUM 8.6-50 MG PO TABS: 2.0000 | ORAL_TABLET | Freq: Two times a day (BID) | ORAL | 0 refills | Status: DC

## 2018-08-02 MED ORDER — POLYETHYLENE GLYCOL 3350 17 G PO PACK: 17.0000 g | PACK | Freq: Two times a day (BID) | ORAL | 0 refills | Status: DC

## 2018-08-02 MED ORDER — ALUM & MAG HYDROXIDE-SIMETH 200-200-20 MG/5ML PO SUSP: 15.0000 mL | Freq: Four times a day (QID) | ORAL | 0 refills | Status: DC | PRN

## 2018-08-02 NOTE — Progress Notes (Signed)
Physical Therapy Treatment Patient Details Name: Rodney [name] MRN: [id number] DOB: 1970-08-16 Today's Date: 08/02/2018    History of Present Illness Pt admitted with intractable pain 2* metastatic CA and with hx of prostate and bladder CA as well as back surgery x 2.    PT Comments    Pt continues motivated and progressing with mobility but requiring increased time this am and requesting O2 advanced 2* SOB with initial sitting - pt activity performed with O2@3L .   Follow Up Recommendations  Home health PT     Equipment Recommendations  None recommended by PT    Recommendations for Other Services       Precautions / Restrictions Precautions Precautions: Fall Restrictions Weight Bearing Restrictions: No    Mobility  Bed Mobility Overal bed mobility: Modified Independent             General bed mobility comments: Pt to EOB sitting unassisted but with increased use of bed rail vs last session  Transfers Overall transfer level: Needs assistance Equipment used: Rolling walker (2 wheeled) Transfers: Sit to/from Stand Sit to Stand: Min guard         General transfer comment: cues for use of UEs to self assist  Ambulation/Gait Ambulation/Gait assistance: Min guard Gait Distance (Feet): 200 Feet Assistive device: Rolling walker (2 wheeled) Gait Pattern/deviations: Step-to pattern;Step-through pattern;Decreased step length - right;Decreased step length - left;Shuffle;Trunk flexed Gait velocity: decr   General Gait Details: cues for posture and position from [employer]             Wheelchair Mobility    Modified Rankin (Stroke Patients Only)       Balance Overall balance assessment: Needs assistance Sitting-balance support: No upper extremity supported;Feet supported Sitting balance-Leahy Scale: Good     Standing balance support: Bilateral upper extremity supported Standing balance-Leahy Scale: Poor                               Cognition Arousal/Alertness: Awake/alert Behavior During Therapy: WFL for tasks assessed/performed Overall Cognitive Status: Within Functional Limits for tasks assessed                                        Exercises      General Comments        Pertinent Vitals/Pain Pain Assessment: Faces Faces Pain Scale: Hurts little more Pain Location: back Pain Descriptors / Indicators: Aching;Sore Pain Intervention(s): Limited activity within patient's tolerance;Monitored during session;Premedicated before session    Home Living                      Prior Function            PT Goals (current goals can now be found in the care plan section) Acute Rehab PT Goals Patient Stated Goal: HOME PT Goal Formulation: With patient Time For Goal Achievement: 08/15/18 Potential to Achieve Goals: Good Progress towards PT goals: Progressing toward goals    Frequency    Min 3X/week      PT Plan Current plan remains appropriate    Co-evaluation              AM-PAC PT "6 Clicks" Mobility   Outcome Measure  Help needed turning from your back to your side while in a flat bed without using bedrails?: None Help  needed moving from lying on your back to sitting on the side of a flat bed without using bedrails?: None Help needed moving to and from a bed to a chair (including a wheelchair)?: A Little Help needed standing up from a chair using your arms (e.g., wheelchair or bedside chair)?: A Little Help needed to walk in hospital room?: A Little Help needed climbing 3-5 steps with a railing? : A Lot 6 Click Score: 19    End of Session Equipment Utilized During Treatment: Gait belt;Oxygen Activity Tolerance: Patient tolerated treatment well Patient left: in chair;with call bell/phone within reach Nurse Communication: Mobility status PT Visit Diagnosis: Difficulty in walking, not elsewhere classified (R26.2);Muscle weakness (generalized) (M62.81)     Time:  1000-1037 PT Time Calculation (min) (ACUTE ONLY): 37 min  Charges:  $Gait Training: 23-37 mins                     [place] Pager [phone] Office [phone]    [place] 08/02/2018, 12:34 PM

## 2018-08-02 NOTE — Progress Notes (Signed)
I stopped by to check in on [name].  He was at radiation and plan for d/c following radiation.  We discussed his pain and bowel regimen yesterday as he was anticipating discharge today.  I discussed with his RN and she will call if he has any palliative needs before discharge.  Micheline Rough, MD [facility] [phone]   NO CHARGE NOTE

## 2018-08-02 NOTE — Progress Notes (Signed)
Pt will discharge home with [facility] for [facility] needs. Referral given to in house rep Santiago Glad, Therapist, sports.

## 2018-08-02 NOTE — Progress Notes (Addendum)
SATURATION QUALIFICATIONS: (This note is used to comply with regulatory documentation for home oxygen)  Patient Saturations on Room Air at Rest = 98%  Patient Saturations on Room Air while Ambulating = 93-99%  Patient Saturations on n/a Liters of oxygen while Ambulating = n/a%  Please briefly explain why patient needs home oxygen:

## 2018-08-02 NOTE — Progress Notes (Signed)
[facility] Therapy Treatment Record Phone [phone]   Radiation Therapy was administered to Rodney [name] on: 08/02/2018  3:43 PM and was treatment # 8 out of a planned course of 10 treatments.  Radiation Treatment  1). Beam photons with 6-10 energy  2). Brachytherapy None  3). Stereotactic Radiosurgery None  4). Other Radiation None     Rodney [name] A Bruna Dills, RT (T)

## 2018-08-02 NOTE — Progress Notes (Signed)
This chaplain attempted a F/U spiritual care visit with the Pt.  Upon arrival, the Pt. greeted the chaplain and the chaplain viewed the Pt. ambulating in the hall with assistance. The Pt. was on the phone when the chaplain returned to the room. The chaplain will F/U with a spiritual care visit as needed.

## 2018-08-02 NOTE — Progress Notes (Signed)
Patients O2 sat 98% at rest prior to ambulation with HR of 127. Patient OOB ambulated approximately 100 feet with no c/o SOB HR 140's with O2 sat ranging 93%-99% on room air. Heart rate down to 120's after 5 min rest post ambulation. O2 sats at 99% post ambulation.

## 2018-08-02 NOTE — Discharge Summary (Signed)
Physician Discharge Summary  Rodney [name] OVF:[id number] DOB: 1971/01/11 DOA: 07/20/2018  PCP: Curlene Labrum, MD  Admit date: 07/20/2018 Discharge date: 08/02/2018  Admitted From: Home Disposition:  Home with Home Health PT/OT  Recommendations for Outpatient Follow-up:  1. Follow up with PCP in 1-2 weeks 2. Follow up with Oncology Dr. [name] on Monday 08/05/2018  3. Follow up with Radiation Oncology  Dr. [name]  4. Follow with Neurosurgery if Necessary  5. Please obtain CMP/CBC, Mag, Phos in one week 6. Please follow up on the following pending results:  Home Health: Yes  Equipment/Devices: None recommended by PT/OT    Discharge Condition: Stable  CODE STATUS: FULL CODE Diet recommendation: Heart Healthy Diet  Brief/Interim Summary: The patient is a 48 year old gentleman with history of bladder tumor, prostate cancer status post robotic assisted cystoprostatectomy, anxiety and depression who presented to the hospital with severe pain in his back over last week. Patient recently had worsening pain on his hip and was found to have left iliac crest lesion. On further imaging studies, he was found to have diffuse progression of bony mets throughout the spine, large medial left iliac wing metastasis with extraosseous extension of tumor. Patient is admitted to the hospital because of severe pain, for radiation treatment and pain management.  Remained in the hospital with severe metastatic cancer pain and hospitalization has been complicated by constipation. Palliative involved in Pain Control and currently continuing to get XRT while hospitalized.  Patient's pain is improved but still has not had a bowel movement even with Relistor x2.  He was given a smog enema with improvement.  Prior to discharge patient was still slightly hypoxic and ordered a chest x-ray and noted that he had a left rib fracture.  Patient was also tachycardic so obtain a CTA to rule out PE and showed no PE but did show  metastatic mediastinal and left hilar lymphadenopathy as well as the left hilar nodal mass obstructs the lingular segment bronchus of the left upper lobe causing some postobstructive atelectasis of the lingula as well as atelectasis involving the right lower lobe.  There is also multiple metastases involving the visualized liver and there is also widespread osseous metastatic disease with a critical osseous finding involving the left posterior lateral T8 vertebral body with epidural tumor extension into the spinal cord likely causing some cord compression.  I discussed the case with neurosurgery Dr. [name] who feels that this is currently not an issue and recommends radiation therapy.  Patient underwent radiation therapy prior to discharge and work with PT and was deemed stable for discharge as his pain did improve and he had a bowel movement and will need to follow-up with Dr. 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 Followed by palliative care for pain control.  -MRI done on 07/20/2018 showed widespread spinal and partially visible rib and pelvic metastasis along with epidural and or neuroforaminal extension of the tumor at T3, T4, T6, T8, L1, L3, L5 but no malignant spinal stenosis or cord compression noted.  There is  multiple metastasis to the spine that are slightly larger since the MRI last month but no pathological vertebral fracture.  No intradural metastasis is noted but there was a large medial left iliac wing metastasis with  extraosseous extension the tumor is substantially increased since 07/07/2018  -CTA of the chest done on 08/02/2018 showed no evidence of any PE but they showed metastatic mediastinal and left hilar lymphadenopathy.  There is also left hilar nodal mass that obstructed the lingula segment bronchus of the left upper lobe accounting for postobstructive atelectasis in the lingula.  There is also atelectasis of the right lower lobe and multiple metastasis involving the visualized liver.  Also noted was widespread osseous metastatic disease the most critical osseous finding at the left posterior lateral T8 vertebral body with epidural tumor extension and spinal stenosis with possible cord compression.  -I discussed the case with neurosurgery Dr. [name] who feels that this is secondary to osseous metastatic disease and does not have cord compression given patient has no neurological symptoms and recommends continuing radiation treatment. -Patient transferred to [place] for XRT.  -Status post bone marrow biopsy on 07/22/2018 from his left iliac crest that shows metastatic urothelial cancer.  -He also has bony lesion on his left tibia and it is painful and would not allow me to palpate.  -Currently remains on Fentanyl patch with increasing dose to 100 mcg/h, Decadron 4 mg IV BID and IV and or continue with cyclobenzaprine 5 mg al Dilaudid. Appreciate palliative care help in managing Pain; will discharge on Decadron 4 mg p.o. twice daily at the recommendation of Dr. Shadad and will continue fentanyl patch and Dilaudid 2 mg every 3 hours as needed -Continue with cyclobenzaprine 5 mg p.o. 3 times daily as needed for muscle spasms at discharge -Continue with duloxetine 30 mg p.o. daily -Poor response to stool softener and laxatives. Was given 2 doses of Methylnaltrexone with no Response; given a smog enema and had success; will be sent home on a bowel regimen with MiraLAX and Senokot twice daily -Followed by oncology and  radiation therapy.  -Patient is receiving radiation treatment. IV fluids have now been stopped -Also has painful lesion left tibia, for XRT but has not been done and patient needs to discuss with Rad Onc . -Pain is limiting his ambulation will get PT and OT to evaluate and treat; continue to ambulate and advance activities as tolerated -Patient improved and ambulated with physical therapy.  He was deemed stable for discharge after he had a bowel movement and after he went to radiation therapy -Per Oncology has a Follow Up on July 20th to discuss Systemic Chemotherapy that will start shortly after Radiation Therapy   Severe Opioid Induced Constipation, improved -Currently remains on fentanyl patch with increasing dose to 100 mcg/h, Decadron and IV and oral Dilaudid.  -Poor response to stool softener and laxatives. Was given 2 doses of Methylnaltrexone with no Response; C/w Bisacodyl Suppository 10 mg x1 (refused); Bisacodyl 5 mg po Dailyprn D/C'd and will add Miralax 17 g BID and Senna-Docusate 1 tab po BID; will try SMOG Enema and improved to have a bowel movement -We will send home on a bowel regimen with MiraLAX 17 g p.o. twice daily as well as senna docusate 1 tab p.o. twice daily  Abnormal Urinalysis -Patient was started on Rocephin on 07/20/2018 with abnormal urine. -Unfortunately urine culture was not sent.  -Does not have any urinary symptoms today.  -Antibiotics have since been discontinued.  Hypercalcemia due to   malignancy -Fairly stable at this time and Ca2+ has trended down to 9.4 and repeat this AM was 9.9 -IV fluids but reduced rate to 50 mL/hr and now stopped   Hypophosphatemia -Patient's Phos Level this AM was 3.3 -Continue to Monitor and Replete as Necessary -Repeat CMP in AM   Macrocytic Anemia -Patient's hemoglobin/hematocrit is now 11.9/30.3 and MCV was 100.8 -Checked anemia panel showed an iron level of 43, U IBC of 113, TIBC 156, saturation ratios of 20%,  ferritin level 1188, folate level 7.6, and vitamin B12 477 -Continue to monitor for signs and symptoms of bleeding; currently no overt bleeding noted -Repeat CBC in a.m.  Hyperglycemia in the setting of prediabetes -In the setting of IV steroid demargination from dexamethasone -Continue monitor and trend blood sugars carefully; Daily Blood Sugars ranging from 96-172 -Obtain hemoglobin A1c in the morning to rule out diabetes component and was 6.0 -We will need to monitor carefully as patient will be discharged home on p.o. dexamethasone twice daily at the discretion of oncology  Anxiety and Depression -C/w Duloxetine 30 mg po Daily   GERD/Heartburn -C/w Pantoprazole 40 mg po Daily   Leukocytosis -Mild at 10.6 and improved to 10.3 -In the setting of IV steroid demargination -Continue monitor for signs and symptoms of infection -Repeat CBC in a.m.  Shortness of breath, stable -In the setting of a rib fracture -Chest x-ray showed a left posterior fifth rib fracture -CT of the chest showed no PE but did show postobstructive atelectasis secondary to likely metastatic disease -Patient ambulated and did not desaturate and was removed off of oxygen -Patient saturations were 93 and above and was deemed stable for discharge without oxygen -We will need to follow-up with Dr. Shadad on Monday  Discharge Instructions  Discharge Instructions    Call MD for:  difficulty breathing, headache or visual disturbances   Complete by: As directed    Call MD for:  extreme fatigue   Complete by: As directed    Call MD for:  hives   Complete by: As directed    Call MD for:  persistant dizziness or light-headedness   Complete by: As directed    Call MD for:  persistant nausea and vomiting   Complete by: As directed    Call MD for:  redness, tenderness, or signs of infection (pain, swelling, redness, odor or green/yellow discharge around incision site)   Complete by: As directed    Call MD for:   severe uncontrolled pain   Complete by: As directed    Call MD for:  temperature >100.4   Complete by: As directed    Diet - low sodium heart healthy   Complete by: As directed    Discharge instructions   Complete by: As directed    You were cared for by a hospitalist during your hospital stay. If you have any questions about your discharge medications or the care you received while you were in the hospital after you are discharged, you can call the unit and ask to speak with the hospitalist on call if the hospitalist that took care of you is not available. Once you are discharged, your primary care physician will handle any further medical issues. Please note that NO REFILLS for any discharge medications will be authorized once you are discharged, as it is imperative that you return to your primary care physician (or establish a relationship with a primary care physician if you do not have one) for your aftercare needs so that   they can reassess your need for medications and monitor your lab values.  Follow up with PCP, Medical Oncology, and Radiation Oncology as well as Palliative Care if needed. Take all medications as prescribed. If symptoms change or worsen please return to the ED for evaluation   Increase activity slowly   Complete by: As directed      Allergies as of 08/02/2018      Reactions   Morphine And Related Itching   Vicodin [hydrocodone-acetaminophen] Itching      Medication List    STOP taking these medications   Oxycodone HCl 10 MG Tabs   oxyCODONE-acetaminophen 10-325 MG tablet Commonly known as: Percocet   OxyCONTIN 10 mg 12 hr tablet Generic drug: oxyCODONE   predniSONE 10 MG tablet Commonly known as: DELTASONE     TAKE these medications   acetaminophen 325 MG tablet Commonly known as: TYLENOL Take 2 tablets (650 mg total) by mouth every 6 (six) hours as needed for mild pain (or Fever >/= 101).   alum & mag hydroxide-simeth 200-200-20 MG/5ML  suspension Commonly known as: MAALOX/MYLANTA Take 15 mLs by mouth every 6 (six) hours as needed for indigestion or heartburn.   cyclobenzaprine 10 MG tablet Commonly known as: FLEXERIL Take 10 mg by mouth 3 (three) times daily.   dexamethasone 4 MG tablet Commonly known as: Decadron Take 1 tablet (4 mg total) by mouth 2 (two) times daily.   DULoxetine 30 MG capsule Commonly known as: CYMBALTA Take 1 capsule (30 mg total) by mouth daily. Start taking on: August 03, 2018   fentaNYL 100 MCG/HR Commonly known as: DURAGESIC Place 1 patch onto the skin every 3 (three) days. Start taking on: August 03, 2018   HYDROmorphone 2 MG tablet Commonly known as: DILAUDID Take 1 tablet (2 mg total) by mouth every 3 (three) hours as needed for moderate pain.   multivitamin with minerals Tabs tablet Take 1 tablet by mouth daily.   naproxen sodium 220 MG tablet Commonly known as: ALEVE Take 220-440 mg by mouth 2 (two) times daily as needed.   ondansetron 4 MG tablet Commonly known as: ZOFRAN Take 1 tablet (4 mg total) by mouth every 6 (six) hours as needed for nausea.   pantoprazole 40 MG tablet Commonly known as: PROTONIX Take 1 tablet (40 mg total) by mouth daily. Start taking on: August 03, 2018   polyethylene glycol 17 g packet Commonly known as: MIRALAX / GLYCOLAX Take 17 g by mouth 2 (two) times daily.   senna-docusate 8.6-50 MG tablet Commonly known as: Senokot-S Take 2 tablets by mouth 2 (two) times daily.   sildenafil 100 MG tablet Commonly known as: VIAGRA Take 100 mg by mouth daily as needed.   vitamin B-12 1000 MCG tablet Commonly known as: CYANOCOBALAMIN Take 1,000 mcg by mouth 2 (two) times a day.   cyanocobalamin 1000 MCG/ML injection Commonly known as: (VITAMIN B-12) Inject 1,000 mcg into the muscle every 30 (thirty) days.      Follow-up Information    [name], Steven E, MD. Call.   Specialty: Family Medicine Why: Follow up within 1 week Contact  information: [address] [place] [address] [phone]        Shadad, Firas N, MD. Call.   Specialty: Oncology Why: Follow up within 1 week Contact information: [address] [place] [address] [phone]          Allergies  Allergen Reactions  . Morphine And Related Itching  . Vicodin [Hydrocodone-Acetaminophen] Itching     Consultations:  Palliative Care  Radiation Oncology  Medical Oncology  Procedures/Studies: Dg Tibia/fibula Left  Result Date: 07/26/2018 CLINICAL DATA:  Metastatic carcinoma with diffuse bone metastases. Tenderness. EXAM: LEFT TIBIA AND FIBULA - 2 VIEW COMPARISON:  None. FINDINGS: Two small lytic lesions are identified in the cortex of the mid tibial diaphysis. IMPRESSION: The 2 lytic lesions adjacent to 1 another in the cortex of the mid tibial diaphysis are most consistent with metastatic disease given history and likely the cause of the patient's pain. Electronically Signed   By: David  Williams III M.D   On: 07/26/2018 19:14   Mr Cervical Spine W Or Wo Contrast  Result Date: 07/20/2018 CLINICAL DATA:  48-year-old male with metastatic prostate cancer. EXAM: MRI TOTAL SPINE WITHOUT AND WITH CONTRAST TECHNIQUE: Multisequence MR imaging of the spine from the cervical spine to the sacrum was performed prior to and following IV contrast administration for evaluation of spinal metastatic disease. CONTRAST:  8 milliliters Gadavist COMPARISON:  Cervical and thoracic MRI 07/16/2018. Lumbar MRI 07/07/2018. FINDINGS: MRI CERVICAL SPINE FINDINGS Alignment: Stable cervical lordosis. Vertebrae: C3 vertebral body metastasis redemonstrated and stable in size, approximately 13 millimeters diameter. No epidural or extraosseous extension. There is a subtle C2 inferior endplate metastasis (series 6, image 7) with no extraosseous extension, increased. No other cervical spine metastasis; mild degenerative marrow edema suspected in the right C3 inferior  articulating facet is also stable. No metastasis at the visible skull base. Cord: Spinal cord signal is within normal limits at all visualized levels. No abnormal intradural enhancement. No dural thickening. Posterior Fossa, vertebral arteries, paraspinal tissues: Cervicomedullary junction is within normal limits. Negative visible posterior fossa. Preserved major vascular flow voids in the neck. The right vertebral artery appears mildly dominant. Disc levels: Stable degenerative changes with spinal stenosis at C3-C4, C5-C6 and C6-C7 again noted. Degenerative cervical neural foraminal stenosis as detailed last month. MRI THORACIC SPINE FINDINGS Segmentation:  Normal. Alignment:  Stable thoracic kyphosis. Vertebrae: Widespread thoracic metastases. A small T2 metastasis is redemonstrated in the midline lamina, and demonstrates early dorsal epidural space extension on series 7, image 7 which appears increased. Posterior vertebral body T4 metastasis with epidural extension to the right of midline measures about 19 millimeters diameter and has not significantly changed. There is no associated spinal stenosis, although there is early epidural involvement of the right T4 neural foramen on series 30, image 6, stable. Metastasis in the T5 spinous process and enlarging the right costovertebral junction is stable (the latter up to 22 millimeters). There is early extension into the right T5 neural foramen but no associated neural impingement. T6 left inferior pedicle metastasis and posterior left 6th rib metastasis redemonstrated, the former with epidural extension into the left T6 neural foramen on series 29, image 13, stable. Left T7 vertebral body metastasis is stable without extraosseous extension. Bulky left side metastasis with ventral epidural and left T8 neural foraminal extension is stable measuring about 31 millimeters diameter. There is associated left foraminal stenosis, stable. Possible small T9 posterior inferior  endplate metastasis is stable, and there are expansile bilateral posterior 9th rib metastases located near the costovertebral junctions (series 32, image 22). The larger is 24 millimeters. The T10 level appears spared. Superior endplate metastases at T11 and T12 are redemonstrated without extraosseous extension. There is a left 12 costovertebral junction metastasis, stable (series 33, image 33). Cord: Capacious spinal canal. Spinal cord signal is within normal limits at all visualized levels. No abnormal intradural enhancement. No dural thickening. Paraspinal and   other soft tissues: Small pleural effusions. Disc levels: No thoracic spinal stenosis despite ventral epidural tumor at T4 and T8. There is stable neural foraminal impingement at the left T6 and T8 levels. MRI LUMBAR SPINE FINDINGS Segmentation:  Normal. Alignment:  Stable mild straightening of lumbar lordosis. Vertebrae: Multilevel lumbar and partially visible sac, T2, ral and pelvic metastases. L1 posterosuperior endplate, right side vertebral body, and right inferior articulating facet metastases re-demonstrated. There is early ventral epidural extension of tumor with the former (series 35, image 10) which has increased since 07/07/2018. The facet metastasis has also increased. Small L2 superior endplate metastasis is stable without extraosseous extension. Three L3 vertebral body metastases, with mildly enlarged involvement at the anterior superior endplate since 07/07/2018, and early extension into the inferior left L3 neural foramen which is stable (series 38, image 16). Previous left L4 laminectomy. A small right L4 vertebral metastasis is minimally increased on series 37, image 5. A midline posterosuperior endplate L5 metastasis with early ventral epidural extension also appears slightly larger on series 38, image 23. At that same level a bulky metastasis in the left iliac wing with extraosseous extension has increased from 31-45 millimeters over the  same time. 24 millimeter left ventral S1 metastasis with early extraosseous extension into the presacral space has also mildly increased. There is no sacral metastasis related neural impingement identified. Conus medullaris: Extends to the T12-L1 level and appears normal. No lower spinal cord or conus signal abnormality. Normal cauda equina nerve roots. No abnormal intradural enhancement or dural thickening. Paraspinal and other soft tissues: Stable visualized abdominal viscera. Disc levels: No lumbar spinal stenosis or malignant neural impingement despite early epidural and foraminal involvement at several levels. IMPRESSION: 1. 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 Epidural and/or neural foraminal extension of tumor at the C3, T4, T6, T8, L1, L3, and L5. But no malignant spinal stenosis or cord compression. Multiple metastases throughout the spine are slightly larger since the MRIs last month, but there is no pathologic vertebral fracture. 2. No intradural metastasis. 3. A large medial left iliac wing metastasis with extraosseous extension of tumor has substantially increased since 07/07/18 (45 mm now versus 31 mm previously). Electronically Signed   By: H  Hall M.D.   On: 07/20/2018 21:16   Mr Thoracic Spine W Wo Contrast  Result Date: 07/20/2018 CLINICAL DATA:  48-year-old male with metastatic prostate cancer. EXAM: MRI TOTAL SPINE WITHOUT AND WITH CONTRAST TECHNIQUE: Multisequence MR imaging of the spine from the cervical spine to the sacrum was performed prior to and following IV contrast administration for evaluation of spinal metastatic disease. CONTRAST:  8 milliliters Gadavist COMPARISON:  Cervical and thoracic MRI 07/16/2018. Lumbar MRI 07/07/2018. FINDINGS: MRI CERVICAL SPINE FINDINGS Alignment: Stable cervical lordosis. Vertebrae: C3 vertebral body metastasis redemonstrated and stable in size, approximately 13 millimeters diameter. No epidural or extraosseous extension.  There is a subtle C2 inferior endplate metastasis (series 6, image 7) with no extraosseous extension, increased. No other cervical spine metastasis; mild degenerative marrow edema suspected in the right C3 inferior articulating facet is also stable. No metastasis at the visible skull base. Cord: Spinal cord signal is within normal limits at all visualized levels. No abnormal intradural enhancement. No dural thickening. Posterior Fossa, vertebral arteries, paraspinal tissues: Cervicomedullary junction is within normal limits. Negative visible posterior fossa. Preserved major vascular flow voids in the neck. The right vertebral artery appears mildly dominant. Disc levels: Stable degenerative changes with spinal stenosis at C3-C4, C5-C6 and C6-C7 again   noted. Degenerative cervical neural foraminal stenosis as detailed last month. MRI THORACIC SPINE FINDINGS Segmentation:  Normal. Alignment:  Stable thoracic kyphosis. Vertebrae: Widespread thoracic metastases. A small T2 metastasis is redemonstrated in the midline lamina, and demonstrates early dorsal epidural space extension on series 7, image 7 which appears increased. Posterior vertebral body T4 metastasis with epidural extension to the right of midline measures about 19 millimeters diameter and has not significantly changed. There is no associated spinal stenosis, although there is early epidural involvement of the right T4 neural foramen on series 30, image 6, stable. Metastasis in the T5 spinous process and enlarging the right costovertebral junction is stable (the latter up to 22 millimeters). There is early extension into the right T5 neural foramen but no associated neural impingement. T6 left inferior pedicle metastasis and posterior left 6th rib metastasis redemonstrated, the former with epidural extension into the left T6 neural foramen on series 29, image 13, stable. Left T7 vertebral body metastasis is stable without extraosseous extension. Bulky left side  metastasis with ventral epidural and left T8 neural foraminal extension is stable measuring about 31 millimeters diameter. There is associated left foraminal stenosis, stable. Possible small T9 posterior inferior endplate metastasis is stable, and there are expansile bilateral posterior 9th rib metastases located near the costovertebral junctions (series 32, image 22). The larger is 24 millimeters. The T10 level appears spared. Superior endplate metastases at T11 and T12 are redemonstrated without extraosseous extension. There is a left 12 costovertebral junction metastasis, stable (series 33, image 33). Cord: Capacious spinal canal. Spinal cord signal is within normal limits at all visualized levels. No abnormal intradural enhancement. No dural thickening. Paraspinal and other soft tissues: Small pleural effusions. Disc levels: No thoracic spinal stenosis despite ventral epidural tumor at T4 and T8. There is stable neural foraminal impingement at the left T6 and T8 levels. MRI LUMBAR SPINE FINDINGS Segmentation:  Normal. Alignment:  Stable mild straightening of lumbar lordosis. Vertebrae: Multilevel lumbar and partially visible sac, T2, ral and pelvic metastases. L1 posterosuperior endplate, right side vertebral body, and right inferior articulating facet metastases re-demonstrated. There is early ventral epidural extension of tumor with the former (series 35, image 10) which has increased since 07/07/2018. The facet metastasis has also increased. Small L2 superior endplate metastasis is stable without extraosseous extension. Three L3 vertebral body metastases, with mildly enlarged involvement at the anterior superior endplate since 07/07/2018, and early extension into the inferior left L3 neural foramen which is stable (series 38, image 16). Previous left L4 laminectomy. A small right L4 vertebral metastasis is minimally increased on series 37, image 5. A midline posterosuperior endplate L5 metastasis with early  ventral epidural extension also appears slightly larger on series 38, image 23. At that same level a bulky metastasis in the left iliac wing with extraosseous extension has increased from 31-45 millimeters over the same time. 24 millimeter left ventral S1 metastasis with early extraosseous extension into the presacral space has also mildly increased. There is no sacral metastasis related neural impingement identified. Conus medullaris: Extends to the T12-L1 level and appears normal. No lower spinal cord or conus signal abnormality. Normal cauda equina nerve roots. No abnormal intradural enhancement or dural thickening. Paraspinal and other soft tissues: Stable visualized abdominal viscera. Disc levels: No lumbar spinal stenosis or malignant neural impingement despite early epidural and foraminal involvement at several levels. IMPRESSION: 1. 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 Epidural and/or neural foraminal extension of tumor at the C3, T4, T6,   T8, L1, L3, and L5. But no malignant spinal stenosis or cord compression. Multiple metastases throughout the spine are slightly larger since the MRIs last month, but there is no pathologic vertebral fracture. 2. No intradural metastasis. 3. A large medial left iliac wing metastasis with extraosseous extension of tumor has substantially increased since 07/07/18 (45 mm now versus 31 mm previously). Electronically Signed   By: Genevie Ann M.D.   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 EXAM: MRI TOTAL SPINE WITHOUT AND WITH CONTRAST TECHNIQUE: Multisequence MR imaging of the spine from the cervical spine to the sacrum was performed prior to and following IV contrast administration for evaluation of spinal metastatic disease. CONTRAST:  8 milliliters Gadavist COMPARISON:  Cervical and thoracic MRI 07/16/2018. Lumbar MRI 07/07/2018. FINDINGS: MRI  CERVICAL SPINE FINDINGS Alignment: Stable cervical lordosis. Vertebrae: C3 vertebral body metastasis redemonstrated and stable in size, approximately 13 millimeters diameter. No epidural or extraosseous extension. There is a subtle C2 inferior endplate metastasis (series 6, image 7) with no extraosseous extension, increased. No other cervical spine metastasis; mild degenerative marrow edema suspected in the right C3 inferior articulating facet is also stable. No metastasis at the visible skull base. Cord: Spinal cord signal is within normal limits at all visualized levels. No abnormal intradural enhancement. No dural thickening. Posterior Fossa, vertebral arteries, paraspinal tissues: Cervicomedullary junction is within normal limits. Negative visible posterior fossa. Preserved major vascular flow voids in the neck. The right vertebral artery appears mildly dominant. Disc levels: Stable degenerative changes with spinal stenosis at C3-C4, C5-C6 and C6-C7 again noted. Degenerative cervical neural foraminal stenosis as detailed last month. MRI THORACIC SPINE FINDINGS Segmentation:  Normal. Alignment:  Stable thoracic kyphosis. Vertebrae: Widespread thoracic metastases. A small T2 metastasis is redemonstrated in the midline lamina, and demonstrates early dorsal epidural space extension on series 7, image 7 which appears increased. Posterior vertebral body T4 metastasis with epidural extension to the right of midline measures about 19 millimeters diameter and has not significantly changed. There is no associated spinal stenosis, although there is early epidural involvement of the right T4 neural foramen on series 30, image 6, stable. Metastasis in the T5 spinous process and enlarging the right costovertebral junction is stable (the latter up to 22 millimeters). There is early extension into the right T5 neural foramen but no associated neural impingement. T6 left inferior pedicle metastasis and posterior left 6th rib  metastasis redemonstrated, the former with epidural extension into the left T6 neural foramen on series 29, image 13, stable. Left T7 vertebral body metastasis is stable without extraosseous extension. Bulky left side metastasis with ventral epidural and left T8 neural foraminal extension is stable measuring about 31 millimeters diameter. There is associated left foraminal stenosis, stable. Possible small T9 posterior inferior endplate metastasis is stable, and there are expansile bilateral posterior 9th rib metastases located near the costovertebral junctions (series 32, image 22). The larger is 24 millimeters. The T10 level appears spared. Superior endplate metastases at Y69 and T12 are redemonstrated without extraosseous extension. There is a left 12 costovertebral junction metastasis, stable (series 33, image 33). Cord: Capacious spinal canal. Spinal cord signal is within normal limits at all visualized levels. No abnormal intradural enhancement. No dural thickening. Paraspinal and other soft tissues: Small pleural effusions. Disc levels: No thoracic spinal stenosis despite ventral epidural tumor at T4 and T8. There is stable neural foraminal impingement at the  left T6 and T8 levels. MRI LUMBAR SPINE FINDINGS Segmentation:  Normal. Alignment:  Stable mild straightening of lumbar lordosis. Vertebrae: Multilevel lumbar and partially visible sac, T2, ral and pelvic metastases. L1 posterosuperior endplate, right side vertebral body, and right inferior articulating facet metastases re-demonstrated. There is early ventral epidural extension of tumor with the former (series 35, image 10) which has increased since 07/07/2018. The facet metastasis has also increased. Small L2 superior endplate metastasis is stable without extraosseous extension. Three L3 vertebral body metastases, with mildly enlarged involvement at the anterior superior endplate since 85/02/7739, and early extension into the inferior left L3 neural  foramen which is stable (series 38, image 16). Previous left L4 laminectomy. A small right L4 vertebral metastasis is minimally increased on series 37, image 5. A midline posterosuperior endplate L5 metastasis with early ventral epidural extension also appears slightly larger on series 38, image 23. At that same level a bulky metastasis in the left iliac wing with extraosseous extension has increased from 31-45 millimeters over the same time. 24 millimeter left ventral S1 metastasis with early extraosseous extension into the presacral space has also mildly increased. There is no sacral metastasis related neural impingement identified. Conus medullaris: Extends to the T12-L1 level and appears normal. No lower spinal cord or conus signal abnormality. Normal cauda equina nerve roots. No abnormal intradural enhancement or dural thickening. Paraspinal and other soft tissues: Stable visualized abdominal viscera. Disc levels: No lumbar spinal stenosis or malignant neural impingement despite early epidural and foraminal involvement at several levels. IMPRESSION: 1. 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 Epidural and/or neural foraminal extension of tumor at the C3, T4, T6, T8, L1, L3, and L5. But no malignant spinal stenosis or cord compression. Multiple metastases throughout the spine are slightly larger since the MRIs last month, but there is no pathologic vertebral fracture. 2. No intradural metastasis. 3. A large medial left iliac wing metastasis with extraosseous extension of tumor has substantially increased since 07/07/18 (45 mm now versus 31 mm previously). Electronically Signed   By: Genevie Ann M.D.   On: 07/20/2018 21:16   Mr Lumbar Spine W Wo Contrast  Result Date: 07/07/2018 CLINICAL DATA:  History of prostate cancer and bladder cancer. Back pain EXAM: MRI LUMBAR SPINE WITHOUT AND WITH CONTRAST TECHNIQUE: Multiplanar and multiecho pulse sequences of the lumbar spine were obtained  without and with intravenous contrast. CONTRAST:  8 mL Gadovist IV COMPARISON:  CT abdomen pelvis 07/05/2018 FINDINGS: Segmentation:  Normal Alignment:  Normal Vertebrae: Multiple enhancing bone marrow lesions are seen compatible with metastatic disease throughout the lumbar spine as well as in the lower thoracic spine and the sacrum. Metastatic deposits are seen throughout the vertebral bodies from T11 through S1. 3 cm mass in the left iliac bone. No epidural tumor. Conus medullaris and cauda equina: Conus extends to the L1-2 level. Conus and cauda equina appear normal. Paraspinal and other soft tissues: Bilateral renal cysts. No retroperitoneal mass or adenopathy. Disc levels: L1-2: Negative L2-3: Mild disc bulging L3-4: Prior laminectomy on the left. Small left paracentral disc protrusion with mild subarticular stenosis on the left L4-5: Prior laminectomy on the left. Broad-based central disc protrusion with associated endplate spurring. Mild subarticular stenosis bilaterally L5-S1: Moderate disc degeneration and spurring. Subarticular stenosis bilaterally due to spurring. Right laminectomy. IMPRESSION: Widespread metastatic disease throughout the lower thoracic spine, tire lumbar spine, and sacrum. 3 cm expansile mass left iliac bone. No pathologic fracture Lower lumbar degenerative change as above. Prior  laminectomy at L3-4, L4-5, L5-S1. Electronically Signed   By: Franchot Gallo M.D.   On: 07/07/2018 14:45   Ct Abdomen Pelvis W Contrast  Result Date: 07/20/2018 CLINICAL DATA:  Back pain. EXAM: CT ABDOMEN AND PELVIS WITH CONTRAST TECHNIQUE: Multidetector CT imaging of the abdomen and pelvis was performed using the standard protocol following bolus administration of intravenous contrast. CONTRAST:  154m OMNIPAQUE IOHEXOL 300 MG/ML  SOLN COMPARISON:  CT scan of July 05, 2018.  MRI of July 07, 2018. FINDINGS: Lower chest: Mild bilateral posterior basilar subsegmental atelectasis is noted. Hepatobiliary: No  gallstones or biliary dilatation is noted. Multiple small rounded hypoechoic lesions are seen throughout hepatic parenchyma concerning for metastatic disease. The largest measures 13 mm. Pancreas: Unremarkable. No pancreatic ductal dilatation or surrounding inflammatory changes. Spleen: Normal in size without focal abnormality. Adrenals/Urinary Tract: Adrenal glands appear normal. Bilateral renal cysts are noted. No hydronephrosis or renal obstruction is noted. No renal or ureteral calculi are noted. The bladder is very irregular in contour which may be postsurgical in etiology. Stomach/Bowel: The stomach appears normal. There is no evidence of bowel obstruction or inflammation. Stool is noted throughout the colon. The appendix appears normal. Vascular/Lymphatic: No significant vascular findings are present. No enlarged abdominal or pelvic lymph nodes. Reproductive: No definite mass is noted. Other: No abdominal wall hernia or abnormality. No abdominopelvic ascites. Musculoskeletal: Multiple lytic metastatic lesions are noted in the visualized left ribs, spine and pelvis. The largest lesion is noted in the T8 vertebral body and measures 2.9 cm. It extends slightly posteriorly into the posterior spinal canal. IMPRESSION: Multiple small hepatic metastatic lesions are noted. Multiple lytic metastatic lesions are noted in the visualized ribs, spine and pelvis. The bladder is irregular in contour which most likely is postsurgical in etiology. Stool is noted throughout the colon. Electronically Signed   By: JMarijo ConceptionM.D.   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 Patient has bone and liver lesions. Patient has a destructive lytic lesion involving the left iliac bone. EXAM: CT-GUIDED BIOPSY OF LEFT ILIAC BONE LESION MEDICATIONS: None. ANESTHESIA/SEDATION: Moderate (conscious) sedation was employed during this procedure. A total  of Versed 2.0 mg and Fentanyl 100 mcg was administered intravenously. Moderate Sedation Time: 15 minutes. The patient's level of consciousness and vital signs were monitored continuously by radiology nursing throughout the procedure under my direct supervision. FLUOROSCOPY TIME:  None COMPLICATIONS: None immediate. PROCEDURE: Informed written consent was obtained from the patient after a thorough discussion of the procedural risks, benefits and alternatives. All questions were addressed. A timeout was performed prior to the initiation of the procedure. Patient was placed on his right side. CT images through the pelvis were obtained. The destructive lesion involving the left iliac bone was identified. Left side of the pelvis was prepped with chlorhexidine and sterile field was created. Skin and soft tissues were anesthetized with 1% lidocaine. Using CT guidance, 17 gauge coaxial needle was directed along the posterior aspect of the lesion. Total of 4 core biopsies were obtained with an 18 gauge device. Specimens placed in formalin. Needle was removed without complication. Bandage placed over the puncture site. FINDINGS: Again noted is large lytic lesion involving the left ilium with destruction of bone. Core biopsies were obtained from the soft tissue component of the lesion. IMPRESSION: CT-guided core biopsies of the left iliac bone lesion. Electronically Signed   By: AMarkus DaftM.D.   On: 07/22/2018  14:31   Dg Chest Port 1 View  Result Date: 07/20/2018 CLINICAL DATA:  Chest pain.  Metastatic bladder and prostate cancer. EXAM: PORTABLE CHEST 1 VIEW COMPARISON:  Chest x-ray dated November 12, 2017. FINDINGS: The heart size and mediastinal contours are within normal limits. Normal pulmonary vascularity. Low lung volumes with mild right basilar atelectasis. No acute osseous abnormality. No focal consolidation, pleural effusion, or pneumothorax. IMPRESSION: Mild right basilar atelectasis.  No active disease.  Electronically Signed   By: William T Derry M.D.   On: 07/20/2018 11:31   Ct Renal Stone Study  Result Date: 07/05/2018 CLINICAL DATA:  History of prostate cancer, bladder cancer, postop. Flank pain. EXAM: CT ABDOMEN AND PELVIS WITHOUT CONTRAST TECHNIQUE: Multidetector CT imaging of the abdomen and pelvis was performed following the standard protocol without IV contrast. COMPARISON:  PET CT 09/25/2017.  CT abdomen and pelvis 06/18/2017. FINDINGS: Lower chest: Mild wall thickening in the lingula with airspace opacity, likely atelectasis. No effusions. Heart is normal size. Hepatobiliary: No focal hepatic abnormality. Gallbladder unremarkable. Pancreas: No focal abnormality or ductal dilatation. Spleen: No focal abnormality.  Normal size. Adrenals/Urinary Tract: Multiple bilateral renal cysts. No hydronephrosis. No renal or ureteral stones. Adrenal glands unremarkable. Foley catheter present in the neobladder which is grossly unremarkable. Stomach/Bowel: Postoperative changes in the right lower quadrant. Moderate stool burden in the colon. No evidence of a bowel obstruction. Vascular/Lymphatic: Aortic atherosclerosis. No enlarged abdominal or pelvic lymph nodes. Reproductive: No visible focal abnormality. Other: No free fluid or free air. Musculoskeletal: Postoperative changes in the right pelvis. No acute bony abnormality. IMPRESSION: Foley catheter in decompressed neobladder.  No hydronephrosis. Bilateral renal cysts are stable since prior study. Moderate stool in the colon. Aortic atherosclerosis. No acute findings. Electronically Signed   By: Kevin  Dover M.D.   On: 07/05/2018 00:49     Subjective: Seen examined at bedside and states his pain was better when he is resting.  Started hurting when he started ambulating but did well with physical therapy.  Denies any chest pain, lightheadedness or dizziness.  No nausea or vomiting.  Was a little short of breath but was worked up and found out that he had a  fractured left pathological rib.  He had radiation therapy and was deemed stable for discharge after as he did well and had a bowel movement.  He will need to follow-up with PCP, radiation oncology, as well as medical oncology and neurosurgery if necessary understand the notes agreeable to plan of care.  He will be discharged home and follow-up with Dr. Short on Monday   Discharge Exam: Vitals:   08/01/18 2034 08/02/18 0608  BP: 121/85 117/89  Pulse: 93 88  Resp: 18 19  Temp: 98.3 F (36.8 C) (!) 97.5 F (36.4 C)  SpO2: 99% 100%   Vitals:   08/01/18 1256 08/01/18 1600 08/01/18 2034 08/02/18 0608  BP: 108/84  121/85 117/89  Pulse: (!) 118  93 88  Resp: 18  18 19  Temp: 97.8 F (36.6 C)  98.3 F (36.8 C) (!) 97.5 F (36.4 C)  TempSrc: Oral  Oral Oral  SpO2: 100% 98% 99% 100%  Weight:      Height:       General: Pt is alert, awake, not in acute distress Cardiovascular: Tachycardic rate but regular rhythm, S1/S2 +, no rubs, no gallops Respiratory: Diminished bilaterally, no wheezing, no rhonchi Abdominal: Soft, NT, ND, bowel sounds + Extremities: Trace edema, no cyanosis  The results of significant diagnostics from   this hospitalization (including imaging, microbiology, ancillary and laboratory) are listed below for reference.    Microbiology: No results found for this or any previous visit (from the past 240 hour(s)).   Labs: BNP (last 3 results) No results for input(s): BNP in the last 8760 hours. Basic Metabolic Panel: Recent Labs  Lab 07/27/18 0947 07/31/18 0927 08/01/18 0520 08/02/18 0553  NA 139 136 139 140  K 4.4 3.9 4.5 4.7  CL 103 99 101 100  CO2 26 26 27 31  GLUCOSE 161* 172* 118* 121*  BUN 15 14 15 19  CREATININE 0.78 0.64 0.53* 0.58*  CALCIUM 9.9 9.4 10.0 9.9  MG 2.1 2.0 2.4 2.3  PHOS 2.8 2.4* 3.8 3.3   Liver Function Tests: Recent Labs  Lab 07/27/18 0947 07/31/18 0927 08/01/18 0520 08/02/18 0553  AST 12* 11* 11* 11*  ALT 17 15 17 17   ALKPHOS 123 140* 151* 151*  BILITOT 0.3 0.5 0.6 0.5  PROT 6.7 6.4* 7.0 6.9  ALBUMIN 2.7* 2.4* 2.6* 2.5*   No results for input(s): LIPASE, AMYLASE in the last 168 hours. No results for input(s): AMMONIA in the last 168 hours. CBC: Recent Labs  Lab 07/31/18 0927 08/01/18 0520 08/02/18 0553  WBC 10.3 10.6* 10.3  NEUTROABS 9.1* 9.0* 8.9*  HGB 11.2* 11.8* 11.9*  HCT 37.3* 38.8* 38.3*  MCV 102.8* 101.0* 100.8*  PLT 271 238 274   Cardiac Enzymes: No results for input(s): CKTOTAL, CKMB, CKMBINDEX, TROPONINI in the last 168 hours. BNP: Invalid input(s): POCBNP CBG: No results for input(s): GLUCAP in the last 168 hours. D-Dimer No results for input(s): DDIMER in the last 72 hours. Hgb A1c Recent Labs    08/02/18 0553  HGBA1C 6.0*   Lipid Profile No results for input(s): CHOL, HDL, LDLCALC, TRIG, CHOLHDL, LDLDIRECT in the last 72 hours. Thyroid function studies No results for input(s): TSH, T4TOTAL, T3FREE, THYROIDAB in the last 72 hours.  Invalid input(s): FREET3 Anemia work up Recent Labs    08/02/18 0553  FOLATE 7.6  RETICCTPCT 1.3   Urinalysis    Component Value Date/Time   COLORURINE YELLOW 07/20/2018 1208   APPEARANCEUR CLOUDY (A) 07/20/2018 1208   LABSPEC 1.010 07/20/2018 1208   PHURINE 7.0 07/20/2018 1208   GLUCOSEU NEGATIVE 07/20/2018 1208   HGBUR SMALL (A) 07/20/2018 1208   BILIRUBINUR NEGATIVE 07/20/2018 1208   KETONESUR NEGATIVE 07/20/2018 1208   PROTEINUR NEGATIVE 07/20/2018 1208   NITRITE NEGATIVE 07/20/2018 1208   LEUKOCYTESUR MODERATE (A) 07/20/2018 1208   Sepsis Labs Invalid input(s): PROCALCITONIN,  WBC,  LACTICIDVEN Microbiology No results found for this or any previous visit (from the past 240 hour(s)).  Time coordinating discharge: 35 minutes  SIGNED:  Omair Latif Sheikh, DO [facility] 08/02/2018, 11:04 AM Pager is on AMION  If 7PM-7AM, please contact night-coverage [email or web address] Password [email or web address]  

## 2018-08-05 ENCOUNTER — Other Ambulatory Visit: Payer: Self-pay

## 2018-08-05 ENCOUNTER — Ambulatory Visit
Admission: RE | Admit: 2018-08-05 | Discharge: 2018-08-05 | Disposition: A | Discharge status: Home / Self Care | Payer: PRIVATE HEALTH INSURANCE | Source: Ambulatory Visit | Attending: Radiation Oncology | Admitting: Radiation Oncology

## 2018-08-05 ENCOUNTER — Other Ambulatory Visit: Payer: Self-pay | Admitting: Oncology

## 2018-08-05 ENCOUNTER — Inpatient Hospital Stay: Payer: PRIVATE HEALTH INSURANCE | Attending: Oncology | Admitting: Oncology

## 2018-08-05 ENCOUNTER — Telehealth: Payer: Self-pay

## 2018-08-05 ENCOUNTER — Telehealth: Payer: Self-pay | Admitting: Oncology

## 2018-08-05 VITALS — BP 132/88 | HR 102 | Temp 97.8°F | Resp 18 | Ht 74.0 in | Wt 170.4 lb

## 2018-08-05 DIAGNOSIS — Z510 Encounter for antineoplastic radiation therapy: Secondary | ICD-10-CM | POA: Diagnosis not present

## 2018-08-05 DIAGNOSIS — C61 Malignant neoplasm of prostate: Secondary | ICD-10-CM

## 2018-08-05 DIAGNOSIS — R531 Weakness: Secondary | ICD-10-CM

## 2018-08-05 DIAGNOSIS — C7951 Secondary malignant neoplasm of bone: Secondary | ICD-10-CM | POA: Diagnosis not present

## 2018-08-05 DIAGNOSIS — C7800 Secondary malignant neoplasm of unspecified lung: Secondary | ICD-10-CM | POA: Diagnosis not present

## 2018-08-05 DIAGNOSIS — C679 Malignant neoplasm of bladder, unspecified: Secondary | ICD-10-CM | POA: Diagnosis present

## 2018-08-05 DIAGNOSIS — Z5111 Encounter for antineoplastic chemotherapy: Secondary | ICD-10-CM | POA: Insufficient documentation

## 2018-08-05 DIAGNOSIS — M898X9 Other specified disorders of bone, unspecified site: Secondary | ICD-10-CM

## 2018-08-05 DIAGNOSIS — Z7189 Other specified counseling: Secondary | ICD-10-CM

## 2018-08-05 DIAGNOSIS — C787 Secondary malignant neoplasm of liver and intrahepatic bile duct: Secondary | ICD-10-CM | POA: Diagnosis not present

## 2018-08-05 MED ORDER — LIDOCAINE-PRILOCAINE 2.5-2.5 % EX CREA: 1.0000 "application " | TOPICAL_CREAM | CUTANEOUS | 0 refills | Status: DC | PRN

## 2018-08-05 MED ORDER — HYDROMORPHONE HCL 2 MG PO TABS: 2.0000 mg | ORAL_TABLET | ORAL | 0 refills | Status: DC | PRN

## 2018-08-05 MED ORDER — FENTANYL 100 MCG/HR TD PT72: 1.0000 | MEDICATED_PATCH | TRANSDERMAL | 0 refills | Status: DC

## 2018-08-05 NOTE — Telephone Encounter (Signed)
Gave avs and calendar. First treatment is an add on. Will call patient once scheduled.

## 2018-08-05 NOTE — Progress Notes (Signed)
Hematology and Oncology Follow Up Visit  Rodney [name] [id number] 12-24-1970 48 y.o. 08/05/2018 9:58 AM Rodney [name], Rodney [name], MDBurdine, Rodney Evener, MD   Principle Diagnosis: 48 year old man with:    1.  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  He was initially diagnosed with T1a urothelial carcinoma of the bladder diagnosed in June 2019.    2.  Prostate cancer presented with a Gleason score of 6 and a PSA 5.58 diagnosed in July 2019.  He is disease-free status post radical prostatectomy.  Prior Therapy:  He is status post a robotic assisted laparoscopic radical cystoprostatectomy and bilateral pelvic lymphadenectomy and neobladder creation completed on November 08, 2017 by Dr. Alinda Money.  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  Prostate adenocarcinoma is 3+4 = 7 with 0 out of 14 lymph nodes noted.  Current therapy: Currently undergoing palliative radiation therapy to the spine.  Interim History: Rodney [name] returns today for repeat evaluation.  Since the last visit, he was hospitalized for increasing back pain and MRI of the spine obtained on July 20, 2018 showed widespread metastatic disease with epidural extension in C3, T4, T6, T8, L1, L3 and L5.  He received radiation therapy while hospitalized also received aggressive pain regimen.  He was discharged on 08/02/2018.  Since his discharge, he is slowly improving although he is not ambulating much at this time.  He does have a condom catheter and urinating regularly.  He does have some weakness in his legs although no specific neurological deficits.   He denied any alteration mental status, neuropathy, confusion or dizziness.  Denies any headaches or lethargy.  Denies any night sweats, weight loss or changes in appetite.  Denied orthopnea, dyspnea on exertion or chest discomfort.  Denies shortness of breath, difficulty  breathing hemoptysis or cough.  Denies any abdominal distention, nausea, early satiety or dyspepsia.  Denies any hematuria, frequency, dysuria or nocturia.  Denies any skin irritation, dryness or rash.  Denies any ecchymosis or petechiae.  Denies any lymphadenopathy or clotting.  Denies any heat or cold intolerance.  Denies any anxiety or depression.  Remaining review of system is negative.        Medications: Medication updated without any changes. Current Outpatient Medications  Medication Sig Dispense Refill  . acetaminophen (TYLENOL) 325 MG tablet Take 2 tablets (650 mg total) by mouth every 6 (six) hours as needed for mild pain (or Fever >/= 101). 30 tablet 0  . alum & mag hydroxide-simeth (MAALOX/MYLANTA) 200-200-20 MG/5ML suspension Take 15 mLs by mouth every 6 (six) hours as needed for indigestion or heartburn. 355 mL 0  . cyanocobalamin (,VITAMIN B-12,) 1000 MCG/ML injection Inject 1,000 mcg into the muscle every 30 (thirty) days.    . cyclobenzaprine (FLEXERIL) 10 MG tablet Take 10 mg by mouth 3 (three) times daily.    [name] dexamethasone (DECADRON) 4 MG tablet Take 1 tablet (4 mg total) by mouth 2 (two) times daily. 60 tablet 0  . DULoxetine (CYMBALTA) 30 MG capsule Take 1 capsule (30 mg total) by mouth daily. 30 capsule 0  . fentaNYL (DURAGESIC) 100 MCG/HR Place 1 patch onto the skin every 3 (three) days. 5 patch 0  . HYDROmorphone (DILAUDID) 2 MG tablet Take 1 tablet (2 mg total) by mouth every 3 (three) hours as needed for moderate pain. 30 tablet 0  . Multiple Vitamin (MULTIVITAMIN WITH MINERALS) TABS tablet  Take 1 tablet by mouth daily.    . naproxen sodium (ALEVE) 220 MG tablet Take 220-440 mg by mouth 2 (two) times daily as needed.    . ondansetron (ZOFRAN) 4 MG tablet Take 1 tablet (4 mg total) by mouth every 6 (six) hours as needed for nausea. 20 tablet 0  . pantoprazole (PROTONIX) 40 MG tablet Take 1 tablet (40 mg total) by mouth daily. 30 tablet 0  . polyethylene glycol  (MIRALAX / GLYCOLAX) 17 g packet Take 17 g by mouth 2 (two) times daily. 14 each 0  . senna-docusate (SENOKOT-S) 8.6-50 MG tablet Take 2 tablets by mouth 2 (two) times daily. 60 tablet 0  . sildenafil (VIAGRA) 100 MG tablet Take 100 mg by mouth daily as needed.    . vitamin B-12 (CYANOCOBALAMIN) 1000 MCG tablet Take 1,000 mcg by mouth 2 (two) times a day.     No current facility-administered medications for this visit.      Allergies:  Allergies  Allergen Reactions  . Morphine And Related Itching  . Vicodin [Hydrocodone-Acetaminophen] Itching    Past Medical History, Surgical history, Social history, and Family History no changes noted on review.    Physical Exam: Blood pressure 132/88, pulse (!) 102, temperature 97.8 F (36.6 C), temperature source Oral, resp. rate 18, height 6\' 2"  (1.88 m), weight 170 lb 6.4 oz (77.3 kg), SpO2 98 %.    ECOG: 1    General appearance: Comfortable appearing without any discomfort.  Chronically ill-appearing. Head: Normocephalic without any trauma Oropharynx: Mucous membranes are moist and pink without any thrush or ulcers. Eyes: Pupils are equal and round reactive to light. Lymph nodes: No cervical, supraclavicular, inguinal or axillary lymphadenopathy.   Heart:regular rate and rhythm.  S1 and S2 without leg edema. Lung: Clear without any rhonchi or wheezes.  No dullness to percussion. Abdomin: Soft, nontender, nondistended with good bowel sounds.  No hepatosplenomegaly. Musculoskeletal: No joint deformity or effusion.  Full range of motion noted. Neurological: No deficits noted on motor, sensory and deep tendon reflex exam. Skin: No petechial rash or dryness.  Appeared moist.             Lab Results: Lab Results  Component Value Date   WBC 10.3 08/02/2018   HGB 11.9 (L) 08/02/2018   HCT 38.3 (L) 08/02/2018   MCV 100.8 (H) 08/02/2018   PLT 274 08/02/2018     Chemistry      Component Value Date/Time   NA 140 08/02/2018  0553   K 4.7 08/02/2018 0553   CL 100 08/02/2018 0553   CO2 31 08/02/2018 0553   BUN 19 08/02/2018 0553   CREATININE 0.58 (L) 08/02/2018 0553      Component Value Date/Time   CALCIUM 9.9 08/02/2018 0553   ALKPHOS 151 (H) 08/02/2018 0553   AST 11 (L) 08/02/2018 0553   ALT 17 08/02/2018 0553   BILITOT 0.5 08/02/2018 0553        Impression and Plan:   48 year old with the following:  1.  Stage IV high-grade urothelial carcinoma arising from the bladder documented in July 2020.  He has localized bladder cancer that was removed via cystoprostatectomy in June 2019.  He underwent biopsy on July 22, 2018 which confirmed the presence of urothelial carcinoma.  The natural course of this disease was reviewed today and treatment options were reiterated.  At this time there is no curative options exist and any treatment is palliative.  Systemic chemotherapy would be his next option and  he would be a good candidate for cisplatin based palliative regimen.   The logistics and rationale for using chemotherapy was reviewed today in detail.  Complication associated with cisplatin and gemcitabine chemotherapy was discussed.  These complications include nausea, vomiting, myelosuppression, fatigue, infusion related complications, renal insufficiency, neutropenia, neutropenic sepsis and rarely serious thrombosis, hospitalization and death.  The benefit would also if he has an excellent response to chemotherapy, curative surgical resection may be attempted.  The plan is to treat with gemcitabine and cisplatin on day 1, gemcitabine day 8 out of a 21-day cycle.  Anticipate needing 6 cycles of therapy    After discussion today, he is agreeable to proceed after chemo education class.     2.  IV access: Risks and benefits of using Port-A-Cath versus peripheral veins was discussed today.  Complication associated with Port-A-Cath insertion include bleeding, infection and thrombosis.  After discussing the risks and  benefits he is agreeable to have that done.   3.  Antiemetics: Prescription for Zofran is available to him.   4.  Renal function surveillance: We will continue to monitor on cisplatin therapy.  His baseline creatinine is normal.   5.  Goals of care:  Therapy is palliative at this time and his disease is incurable.   6.  Bone pain: He is currently receiving palliative radiation therapy to the spine.  He is currently on fentanyl patch 100 mcg with breakthrough Dilaudid as needed.  7.  Follow-up: In the immediate future to start systemic chemotherapy.  40  minutes was spent with the patient face-to-face today.  More than 50% of time was spent on reviewing his disease status, reviewing imaging studies, pathology results, treatment options and complications related to therapy.    Zola Button, MD 7/20/20209:58 AM

## 2018-08-05 NOTE — Progress Notes (Signed)
START ON PATHWAY REGIMEN - Bladder     A cycle is every 21 days:     Gemcitabine      Cisplatin   **Always confirm dose/schedule in your pharmacy ordering system**  Patient Characteristics: Metastatic Disease, First Line, No Prior Platinum-Based Therapy, Good Renal Function (CrCl ? 50 mL/min) Therapeutic Status: Metastatic Disease Line of Therapy: First Line Prior Platinum-Based Therapy<= No Renal Function: Good Renal Function (CrCl ? 50 mL/min) Intent of Therapy: Non-Curative / Palliative Intent, Discussed with Patient

## 2018-08-05 NOTE — Telephone Encounter (Signed)
  Verbal order for hospital bed received from Dr. [name]. 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 Hospital bed will help alleviate pain due to the ability to reposition frequently, and elevating the [facility] to greater than 30 degrees, which cannot be achieved with a normal bed. Order placed and communicated to [facility] with [place].

## 2018-08-06 ENCOUNTER — Other Ambulatory Visit: Payer: Self-pay | Admitting: Radiology

## 2018-08-06 ENCOUNTER — Encounter: Payer: Self-pay | Admitting: Radiation Oncology

## 2018-08-06 ENCOUNTER — Ambulatory Visit
Admission: RE | Admit: 2018-08-06 | Discharge: 2018-08-06 | Disposition: A | Discharge status: Home / Self Care | Payer: PRIVATE HEALTH INSURANCE | Source: Ambulatory Visit | Attending: Radiation Oncology | Admitting: Radiation Oncology

## 2018-08-06 DIAGNOSIS — Z5111 Encounter for antineoplastic chemotherapy: Secondary | ICD-10-CM | POA: Diagnosis not present

## 2018-08-06 DIAGNOSIS — C7951 Secondary malignant neoplasm of bone: Secondary | ICD-10-CM

## 2018-08-07 ENCOUNTER — Other Ambulatory Visit: Payer: Self-pay | Admitting: Oncology

## 2018-08-07 ENCOUNTER — Ambulatory Visit (HOSPITAL_COMMUNITY)
Admission: RE | Admit: 2018-08-07 | Discharge: 2018-08-07 | Disposition: A | Discharge status: Home / Self Care | Payer: PRIVATE HEALTH INSURANCE | Source: Ambulatory Visit | Attending: Oncology | Admitting: Oncology

## 2018-08-07 ENCOUNTER — Encounter (HOSPITAL_COMMUNITY): Payer: Self-pay

## 2018-08-07 ENCOUNTER — Telehealth: Payer: Self-pay | Admitting: Oncology

## 2018-08-07 ENCOUNTER — Other Ambulatory Visit: Payer: Self-pay

## 2018-08-07 DIAGNOSIS — Z79899 Other long term (current) drug therapy: Secondary | ICD-10-CM | POA: Insufficient documentation

## 2018-08-07 DIAGNOSIS — C61 Malignant neoplasm of prostate: Secondary | ICD-10-CM | POA: Diagnosis not present

## 2018-08-07 DIAGNOSIS — Z885 Allergy status to narcotic agent status: Secondary | ICD-10-CM | POA: Diagnosis not present

## 2018-08-07 HISTORY — PX: IR IMAGING GUIDED PORT INSERTION: IMG5740

## 2018-08-07 LAB — CBC
HCT: 40.2 % (ref 39.0–52.0)
Hemoglobin: 12.7 g/dL — ABNORMAL LOW (ref 13.0–17.0)
MCH: 31.1 pg (ref 26.0–34.0)
MCHC: 31.6 g/dL (ref 30.0–36.0)
MCV: 98.3 fL (ref 80.0–100.0)
Platelets: 243 10*3/uL (ref 150–400)
RBC: 4.09 MIL/uL — ABNORMAL LOW (ref 4.22–5.81)
RDW: 12.4 % (ref 11.5–15.5)
WBC: 10.3 10*3/uL (ref 4.0–10.5)
nRBC: 0 % (ref 0.0–0.2)

## 2018-08-07 LAB — PROTIME-INR
INR: 1.1 (ref 0.8–1.2)
Prothrombin Time: 14.1 seconds (ref 11.4–15.2)

## 2018-08-07 MED ORDER — CEFAZOLIN SODIUM-DEXTROSE 2-4 GM/100ML-% IV SOLN
2.0000 g | Freq: Once | INTRAVENOUS | Status: AC
  Administered 2018-08-07: 2 g via INTRAVENOUS

## 2018-08-07 MED ORDER — LIDOCAINE-EPINEPHRINE (PF) 2 %-1:200000 IJ SOLN
INTRAMUSCULAR | Status: AC
  Filled 2018-08-07: qty 20

## 2018-08-07 MED ORDER — HEPARIN SOD (PORK) LOCK FLUSH 100 UNIT/ML IV SOLN
INTRAVENOUS | Status: AC
  Filled 2018-08-07: qty 5

## 2018-08-07 MED ORDER — MIDAZOLAM HCL 2 MG/2ML IJ SOLN
INTRAMUSCULAR | Status: AC
  Filled 2018-08-07: qty 2

## 2018-08-07 MED ORDER — HEPARIN SOD (PORK) LOCK FLUSH 100 UNIT/ML IV SOLN
INTRAVENOUS | Status: AC | PRN
  Administered 2018-08-07: 500 [IU] via INTRAVENOUS

## 2018-08-07 MED ORDER — FENTANYL CITRATE (PF) 100 MCG/2ML IJ SOLN
INTRAMUSCULAR | Status: AC
  Filled 2018-08-07: qty 2

## 2018-08-07 MED ORDER — CEFAZOLIN SODIUM-DEXTROSE 2-4 GM/100ML-% IV SOLN
INTRAVENOUS | Status: AC
  Administered 2018-08-07: 2 g via INTRAVENOUS
  Filled 2018-08-07: qty 100

## 2018-08-07 MED ORDER — SODIUM CHLORIDE 0.9 % IV SOLN
INTRAVENOUS | Status: DC
  Administered 2018-08-07: 10:00:00 via INTRAVENOUS

## 2018-08-07 MED ORDER — LIDOCAINE HCL (PF) 1 % IJ SOLN
INTRAMUSCULAR | Status: AC | PRN
  Administered 2018-08-07 (×2): 10 mL

## 2018-08-07 MED ORDER — MIDAZOLAM HCL 2 MG/2ML IJ SOLN
INTRAMUSCULAR | Status: AC | PRN
  Administered 2018-08-07 (×3): 1 mg via INTRAVENOUS

## 2018-08-07 MED ORDER — FENTANYL CITRATE (PF) 100 MCG/2ML IJ SOLN
INTRAMUSCULAR | Status: AC | PRN
  Administered 2018-08-07 (×2): 50 ug via INTRAVENOUS

## 2018-08-07 NOTE — Procedures (Signed)
Interventional Radiology Procedure Note  Procedure: Placement of a right IJ approach single lumen PowerPort.  Tip is positioned at the superior cavoatrial junction and catheter is ready for immediate use.  Complications: No immediate Recommendations:  - Ok to shower tomorrow - Do not submerge for 7 days - Routine line care   Signed,  Shakeela Rabadan K. Raeden Belzer, MD   

## 2018-08-07 NOTE — Telephone Encounter (Signed)
Called and spoke with patient. Confirmed 7/28 appt

## 2018-08-07 NOTE — H&P (Signed)
Referring Physician(s): [name]  Supervising Physician: Jacqulynn Cadet  Patient Status:  [facility] OP  Chief Complaint: "I'm getting a port a cath"   Subjective: Patient familiar to IR service from left iliac bone lesion biopsy on 07/22/2018.  He has a history of both prostate as well as stage IV bladder carcinoma, status post surgery as well as radiation therapy.  Recent imaging reveals widespread metastatic disease and he presents today for Port-A-Cath placement for palliative chemotherapy.  He currently denies fever, headache, chest pain, cough, abdominal pain, or bleeding.  He does have some dyspnea with exertion, recent nausea /vomiting, back as well as bone pain.  Additional medical history as below.  Past Medical History:  Diagnosis Date  . Anxiety   . Bladder tumor   . Blood in urine    saw some 3 days ago but none inthe last 2 days   . Bradycardia   . Cancer ([place])   . Depression   . Dizziness    historical   . Elevated PSA   . Family history of colon cancer   . Family history of gastric cancer   . Family history of melanoma   . Family history of prostate cancer   . Heartburn    occasional   . Pain with urination   . Prostate cancer ([place])   . Urothelial cancer ([place])   . Urothelial carcinoma [facility])    Past Surgical History:  Procedure Laterality Date  . BACK SURGERY  2012   3 disc; dr Carloyn Manner  did first and dr elsner did the last 2   . CYSTOSCOPY W/ RETROGRADES N/A 07/02/2017   Procedure: CYSTOSCOPY WITH RETROGRADE PYELOGRAM/EXAM UNDER ANESTHESIA;  Surgeon: Raynelle Bring, MD;  Location: [facility];  Service: Urology;  Laterality: N/A;  . CYSTOSCOPY WITH BIOPSY N/A 08/09/2017   Procedure: CYSTOSCOPY WITH PROSTATE NEEDLE BIOPSY;  Surgeon: Raynelle Bring, MD;  Location: [facility];  Service: Urology;  Laterality: N/A;  GENERAL ANESTHESIA WITH PARALYSIS/ ONLY NEEDS 60 MIN FOR ALL PROCEDURES  . LEG SURGERY  2012   4 metal plates in plates   . TRANSRECTAL ULTRASOUND N/A  08/09/2017   Procedure: TRANSRECTAL ULTRASOUND;  Surgeon: Raynelle Bring, MD;  Location: [facility];  Service: Urology;  Laterality: N/A;  ONLY NEEDS 60 MIN FOR ALL PROCEDURES  . TRANSURETHRAL RESECTION OF BLADDER TUMOR N/A 07/02/2017   Procedure: TRANSURETHRAL RESECTION OF BLADDER TUMOR (TURBT);  Surgeon: Raynelle Bring, MD;  Location: [facility];  Service: Urology;  Laterality: N/A;  . TRANSURETHRAL RESECTION OF BLADDER TUMOR N/A 08/09/2017   Procedure: TRANSURETHRAL RESECTION OF BLADDER TUMOR (TURBT);  Surgeon: Raynelle Bring, MD;  Location: [facility];  Service: Urology;  Laterality: N/A;       Allergies: Morphine and related and Vicodin [hydrocodone-acetaminophen]  Medications: Prior to Admission medications   Medication Sig Start Date End Date Taking? Authorizing Provider  acetaminophen (TYLENOL) 325 MG tablet Take 2 tablets (650 mg total) by mouth every 6 (six) hours as needed for mild pain (or Fever >/= 101). 08/02/18  Yes Sheikh, Omair Latif, DO  alum & mag hydroxide-simeth (MAALOX/MYLANTA) 200-200-20 MG/5ML suspension Take 15 mLs by mouth every 6 (six) hours as needed for indigestion or heartburn. 08/02/18  Yes Sheikh, Omair Latif, DO  cyanocobalamin (,VITAMIN B-12,) 1000 MCG/ML injection Inject 1,000 mcg into the muscle every 30 (thirty) days.   Yes [provider]  cyclobenzaprine (FLEXERIL) 10 MG tablet Take 10 mg by mouth 3 (three) times daily.   Yes [provider]  dexamethasone (DECADRON) 4 MG tablet Take 1 tablet (4 mg total) by mouth 2 (two) times daily. 08/02/18 09/01/18 Yes Sheikh, Omair Latif, DO  DULoxetine (CYMBALTA) 30 MG capsule Take 1 capsule (30 mg total) by mouth daily. 08/03/18  Yes Sheikh, Omair Latif, DO  fentaNYL (DURAGESIC) 100 MCG/HR Place 1 patch onto the skin every 3 (three) days. 08/05/18  Yes [name], MD  HYDROmorphone (DILAUDID) 2 MG tablet Take 1 tablet (2 mg total) by mouth every 3 (three) hours as needed for moderate pain. 08/05/18  Yes [name], MD  Multiple Vitamin (MULTIVITAMIN WITH MINERALS) TABS tablet Take 1 tablet by mouth daily.   Yes [provider]  naproxen sodium (ALEVE) 220 MG tablet Take 220-440 mg by mouth 2 (two) times daily as needed.   Yes [provider]  ondansetron (ZOFRAN) 4 MG tablet Take 1 tablet (4 mg total) by mouth every 6 (six) hours as needed for nausea. 08/02/18  Yes Sheikh, Omair Latif, DO  pantoprazole (PROTONIX) 40 MG tablet Take 1 tablet (40 mg total) by mouth daily. 08/03/18  Yes Sheikh, Omair Latif, DO  polyethylene glycol (MIRALAX / GLYCOLAX) 17 g packet Take 17 g by mouth 2 (two) times daily. 08/02/18  Yes Sheikh, Omair Latif, DO  senna-docusate (SENOKOT-S) 8.6-50 MG tablet Take 2 tablets by mouth 2 (two) times daily. 08/02/18  Yes Sheikh, Omair Latif, DO  vitamin B-12 (CYANOCOBALAMIN) 1000 MCG tablet Take 1,000 mcg by mouth 2 (two) times a day.   Yes [provider]  lidocaine-prilocaine (EMLA) cream Apply 1 application topically as needed. 08/05/18   [name], MD  sildenafil (VIAGRA) 100 MG tablet Take 100 mg by mouth daily as needed. 03/22/18   [provider]     Vital Signs: Blood pressure 120/83, temp 98.1, heart rate 108, respirations 20, O2 sats 98% room air Ht 6\' 2"  (1.88 m)   Wt 170 lb (77.1 kg)   BMI 21.83 kg/m   Physical Exam awake, alert.  Chest with slightly diminished breath sounds bases.  Heart with tachycardic but regular rhythm.  Abdomen soft, positive bowel sounds, currently nontender.  No lower extremity edema.  Imaging: No results found.  Labs:  CBC: Recent Labs    07/31/18 0927 08/01/18 0520 08/02/18 0553 08/07/18 0935  WBC 10.3 10.6* 10.3 10.3  HGB 11.2* 11.8* 11.9* 12.7*  HCT 37.3* 38.8* 38.3* 40.2  PLT 271 238 274 243    COAGS: Recent Labs    07/20/18 1053  INR 1.1    BMP: Recent Labs    07/27/18 0947 07/31/18 0927 08/01/18 0520 08/02/18 0553  NA 139 136 139 140  K 4.4 3.9 4.5 4.7  CL 103 99 101  100  CO2 26 26 27 31   GLUCOSE 161* 172* 118* 121*  BUN 15 14 15 19   CALCIUM 9.9 9.4 10.0 9.9  CREATININE 0.78 0.64 0.53* 0.58*  GFRNONAA >60 >60 >60 >60  GFRAA >60 >60 >60 >60    LIVER FUNCTION TESTS: Recent Labs    07/27/18 0947 07/31/18 0927 08/01/18 0520 08/02/18 0553  BILITOT 0.3 0.5 0.6 0.5  AST 12* 11* 11* 11*  ALT 17 15 17 17   ALKPHOS 123 140* 151* 151*  PROT 6.7 6.4* 7.0 6.9  ALBUMIN 2.7* 2.4* 2.6* 2.5*    Assessment and Plan: Pt with history of both prostate as well as stage IV bladder carcinoma, status post surgery as well as radiation therapy.  Recent imaging reveals  widespread metastatic disease and he presents today for Port-A-Cath placement for palliative chemotherapy.  Risks and benefits of image guided port-a-catheter placement was discussed with the patient including, but not limited to bleeding, infection, pneumothorax, or fibrin sheath development and need for additional procedures.  All of the patient's questions were answered, patient is agreeable to proceed. Consent signed and in chart.     Electronically Signed: D. Rowe Robert, PA-C 08/07/2018, 10:01 AM   I spent a total of 25 minutes at the the patient's bedside AND on the patient's hospital floor or unit, greater than 50% of which was counseling/coordinating care for Port-A-Cath placement

## 2018-08-07 NOTE — Discharge Instructions (Addendum)
Implanted Port Insertion, Care After °This sheet gives you information about how to care for yourself after your procedure. Your health care provider may also give you more specific instructions. If you have problems or questions, contact your health care provider. °What can I expect after the procedure? °After the procedure, it is common to have: °· Discomfort at the port insertion site. °· Bruising on the skin over the port. This should improve over 3-4 days. °Follow these instructions at home: °Port care °· After your port is placed, you will get a manufacturer's information card. The card has information about your port. Keep this card with you at all times. °· Take care of the port as told by your health care provider. Ask your health care provider if you or a family member can get training for taking care of the port at home. A home health care nurse may also take care of the port. °· Make sure to remember what type of port you have. °Incision care ° °  ° °· Follow instructions from your health care provider about how to take care of your port insertion site. Make sure you: °? Wash your hands with soap and water before and after you change your bandage (dressing). If soap and water are not available, use hand sanitizer. °? Change your dressing as told by your health care provider. °? Leave stitches (sutures), skin glue, or adhesive strips in place. These skin closures may need to stay in place for 2 weeks or longer. If adhesive strip edges start to loosen and curl up, you may trim the loose edges. Do not remove adhesive strips completely unless your health care provider tells you to do that. °· Check your port insertion site every day for signs of infection. Check for: °? Redness, swelling, or pain. °? Fluid or blood. °? Warmth. °? Pus or a bad smell. °Activity °· Return to your normal activities as told by your health care provider. Ask your health care provider what activities are safe for you. °· Do not  lift anything that is heavier than 10 lb (4.5 kg), or the limit that you are told, until your health care provider says that it is safe. °General instructions °· Take over-the-counter and prescription medicines only as told by your health care provider. °· Do not take baths, swim, or use a hot tub until your health care provider approves. Ask your health care provider if you may take showers. You may only be allowed to take sponge baths. °· Do not drive for 24 hours if you were given a sedative during your procedure. °· Wear a medical alert bracelet in case of an emergency. This will tell any health care providers that you have a port. °· Keep all follow-up visits as told by your health care provider. This is important. °Contact a health care provider if: °· You cannot flush your port with saline as directed, or you cannot draw blood from the port. °· You have a fever or chills. °· You have redness, swelling, or pain around your port insertion site. °· You have fluid or blood coming from your port insertion site. °· Your port insertion site feels warm to the touch. °· You have pus or a bad smell coming from the port insertion site. °Get help right away if: °· You have chest pain or shortness of breath. °· You have bleeding from your port that you cannot control. °Summary °· Take care of the port as told by your health   care provider. Keep the manufacturer's information card with you at all times.  Change your dressing as told by your health care provider.  Contact a health care provider if you have a fever or chills or if you have redness, swelling, or pain around your port insertion site.  Keep all follow-up visits as told by your health care provider. This information is not intended to replace advice given to you by your health care provider. Make sure you discuss any questions you have with your health care provider. Document Released: 10/23/2012 Document Revised: 07/31/2017 Document Reviewed:  07/31/2017 Elsevier Patient Education  [place].  Moderate Conscious Sedation, Adult, Care After These instructions provide you with information about caring for yourself after your procedure. Your health care provider may also give you more specific instructions. Your treatment has been planned according to current medical practices, but problems sometimes occur. Call your health care provider if you have any problems or questions after your procedure. What can I expect after the procedure? After your procedure, it is common:  To feel sleepy for several hours.  To feel clumsy and have poor balance for several hours.  To have poor judgment for several hours.  To vomit if you eat too soon. Follow these instructions at home: For at least 24 hours after the procedure:   Do not: ? Participate in activities where you could fall or become injured. ? Drive. ? Use heavy machinery. ? Drink alcohol. ? Take sleeping pills or medicines that cause drowsiness. ? Make important decisions or sign legal documents. ? Take care of children on your own.  Rest. Eating and drinking  Follow the diet recommended by your health care provider.  If you vomit: ? Drink water, juice, or soup when you can drink without vomiting. ? Make sure you have little or no nausea before eating solid foods. General instructions  Have a responsible adult stay with you until you are awake and alert.  Take over-the-counter and prescription medicines only as told by your health care provider.  If you smoke, do not smoke without supervision.  Keep all follow-up visits as told by your health care provider. This is important. Contact a health care provider if:  You keep feeling nauseous or you keep vomiting.  You feel light-headed.  You develop a rash.  You have a fever. Get help right away if:  You have trouble breathing. This information is not intended to replace advice given to you by your  health care provider. Make sure you discuss any questions you have with your health care provider. Document Released: 10/23/2012 Document Revised: 12/15/2016 Document Reviewed: 04/24/2015 Elsevier Patient Education  2020 [place] may shower after 11:30 tomorrow- 08/08/18- remove dressing, pat area dry when you get out of shower, you do not have to put anything else on it. It will look bruised, it will be sore. Take the medication that you normally take for aches and pains. You can not use  the elma(lidocaine) cream until after the skin glue has come off, it usually takes 2 weeks. (it will remove the skin glue, and you don't want that). The skin glue will peel up as it's coming off, don't pick at it.

## 2018-08-07 NOTE — Sedation Documentation (Signed)
Patient is resting comfortably. 

## 2018-08-08 ENCOUNTER — Inpatient Hospital Stay: Payer: PRIVATE HEALTH INSURANCE

## 2018-08-08 ENCOUNTER — Encounter: Payer: Self-pay | Admitting: Oncology

## 2018-08-08 NOTE — Progress Notes (Signed)
Called pt to introduce myself as his Arboriculturist.  Unfortunately there aren't any foundations offering copay assistance for his Dx and the type of ins he has.  I offered the [facility] and Prostate grants, went over what they cover and gave him the income requirement.  Pt would like to apply so he will bring his proof of income on 08/13/18.  If approved I will give him an expense sheet and my card for any questions or concerns he may have in the future.

## 2018-08-13 ENCOUNTER — Inpatient Hospital Stay: Payer: PRIVATE HEALTH INSURANCE

## 2018-08-13 ENCOUNTER — Other Ambulatory Visit: Payer: Self-pay

## 2018-08-13 ENCOUNTER — Encounter: Payer: Self-pay | Admitting: Oncology

## 2018-08-13 VITALS — BP 109/84 | HR 97 | Temp 99.2°F | Resp 18

## 2018-08-13 DIAGNOSIS — C679 Malignant neoplasm of bladder, unspecified: Secondary | ICD-10-CM

## 2018-08-13 DIAGNOSIS — Z5111 Encounter for antineoplastic chemotherapy: Secondary | ICD-10-CM | POA: Diagnosis not present

## 2018-08-13 DIAGNOSIS — C801 Malignant (primary) neoplasm, unspecified: Secondary | ICD-10-CM

## 2018-08-13 DIAGNOSIS — C61 Malignant neoplasm of prostate: Secondary | ICD-10-CM

## 2018-08-13 DIAGNOSIS — Z95828 Presence of other vascular implants and grafts: Secondary | ICD-10-CM

## 2018-08-13 DIAGNOSIS — C7951 Secondary malignant neoplasm of bone: Secondary | ICD-10-CM

## 2018-08-13 LAB — CMP (CANCER CENTER ONLY)
ALT: 28 U/L (ref 0–44)
AST: 18 U/L (ref 15–41)
Albumin: 1.9 g/dL — ABNORMAL LOW (ref 3.5–5.0)
Alkaline Phosphatase: 250 U/L — ABNORMAL HIGH (ref 38–126)
Anion gap: 11 (ref 5–15)
BUN: 10 mg/dL (ref 6–20)
CO2: 27 mmol/L (ref 22–32)
Calcium: 9.3 mg/dL (ref 8.9–10.3)
Chloride: 97 mmol/L — ABNORMAL LOW (ref 98–111)
Creatinine: 0.67 mg/dL (ref 0.61–1.24)
GFR, Est AFR Am: >60 mL/min (ref >60)
GFR, Estimated: >60 mL/min (ref >60)
Glucose, Bld: 158 mg/dL — ABNORMAL HIGH (ref 70–99)
Potassium: 4.7 mmol/L (ref 3.5–5.1)
Sodium: 135 mmol/L (ref 135–145)
Total Bilirubin: 0.3 mg/dL (ref 0.3–1.2)
Total Protein: 6.7 g/dL (ref 6.5–8.1)

## 2018-08-13 LAB — CBC WITH DIFFERENTIAL (CANCER CENTER ONLY)
Abs Immature Granulocytes: 0.7 10*3/uL — ABNORMAL HIGH (ref 0.00–0.07)
Basophils Absolute: 0.1 10*3/uL (ref 0.0–0.1)
Basophils Relative: 0 %
Eosinophils Absolute: 0 10*3/uL (ref 0.0–0.5)
Eosinophils Relative: 0 %
HCT: 35.3 % — ABNORMAL LOW (ref 39.0–52.0)
Hemoglobin: 11.5 g/dL — ABNORMAL LOW (ref 13.0–17.0)
Immature Granulocytes: 6 %
Lymphocytes Relative: 3 %
Lymphs Abs: 0.4 10*3/uL — ABNORMAL LOW (ref 0.7–4.0)
MCH: 31 pg (ref 26.0–34.0)
MCHC: 32.6 g/dL (ref 30.0–36.0)
MCV: 95.1 fL (ref 80.0–100.0)
Monocytes Absolute: 0.6 10*3/uL (ref 0.1–1.0)
Monocytes Relative: 5 %
Neutro Abs: 10.4 10*3/uL — ABNORMAL HIGH (ref 1.7–7.7)
Neutrophils Relative %: 86 %
Platelet Count: 352 10*3/uL (ref 150–400)
RBC: 3.71 MIL/uL — ABNORMAL LOW (ref 4.22–5.81)
RDW: 12.6 % (ref 11.5–15.5)
WBC Count: 12.2 10*3/uL — ABNORMAL HIGH (ref 4.0–10.5)
nRBC: 0 % (ref 0.0–0.2)

## 2018-08-13 MED ORDER — PALONOSETRON HCL INJECTION 0.25 MG/5ML
INTRAVENOUS | Status: AC
  Filled 2018-08-13: qty 5

## 2018-08-13 MED ORDER — SODIUM CHLORIDE 0.9 % IV SOLN
Freq: Once | INTRAVENOUS | Status: AC
  Administered 2018-08-13: 09:00:00 via INTRAVENOUS
  Filled 2018-08-13: qty 250

## 2018-08-13 MED ORDER — SODIUM CHLORIDE 0.9% FLUSH
10.0000 mL | INTRAVENOUS | Status: DC | PRN
  Administered 2018-08-13: 10 mL
  Filled 2018-08-13: qty 10

## 2018-08-13 MED ORDER — POTASSIUM CHLORIDE 2 MEQ/ML IV SOLN
Freq: Once | INTRAVENOUS | Status: AC
  Administered 2018-08-13: 09:00:00 via INTRAVENOUS
  Filled 2018-08-13: qty 10

## 2018-08-13 MED ORDER — SODIUM CHLORIDE 0.9% FLUSH
10.0000 mL | Freq: Once | INTRAVENOUS | Status: AC
  Administered 2018-08-13: 10 mL
  Filled 2018-08-13: qty 10

## 2018-08-13 MED ORDER — HEPARIN SOD (PORK) LOCK FLUSH 100 UNIT/ML IV SOLN
500.0000 [IU] | Freq: Once | INTRAVENOUS | Status: AC | PRN
  Administered 2018-08-13: 16:00:00 500 [IU]
  Filled 2018-08-13: qty 5

## 2018-08-13 MED ORDER — SODIUM CHLORIDE 0.9 % IV SOLN
70.0000 mg/m2 | Freq: Once | INTRAVENOUS | Status: AC
  Administered 2018-08-13: 141 mg via INTRAVENOUS
  Filled 2018-08-13: qty 141

## 2018-08-13 MED ORDER — SODIUM CHLORIDE 0.9 % IV SOLN
1000.0000 mg/m2 | Freq: Once | INTRAVENOUS | Status: AC
  Administered 2018-08-13: 2014 mg via INTRAVENOUS
  Filled 2018-08-13: qty 52.97

## 2018-08-13 MED ORDER — PALONOSETRON HCL INJECTION 0.25 MG/5ML
0.2500 mg | Freq: Once | INTRAVENOUS | Status: AC
  Administered 2018-08-13: 11:00:00 0.25 mg via INTRAVENOUS

## 2018-08-13 MED ORDER — SODIUM CHLORIDE 0.9 % IV SOLN
Freq: Once | INTRAVENOUS | Status: AC
  Administered 2018-08-13: 11:00:00 via INTRAVENOUS
  Filled 2018-08-13: qty 5

## 2018-08-13 NOTE — Patient Instructions (Signed)
[facility] Discharge Instructions for Patients Receiving Chemotherapy  Today you received the following chemotherapy agents Gemzar and Cisplatin   To help prevent nausea and vomiting after your treatment, we encourage you to take your nausea medication as directed.    If you develop nausea and vomiting that is not controlled by your nausea medication, call the clinic.   BELOW ARE SYMPTOMS THAT SHOULD BE REPORTED IMMEDIATELY:  *FEVER GREATER THAN 100.5 F  *CHILLS WITH OR WITHOUT FEVER  NAUSEA AND VOMITING THAT IS NOT CONTROLLED WITH YOUR NAUSEA MEDICATION  *UNUSUAL SHORTNESS OF BREATH  *UNUSUAL BRUISING OR BLEEDING  TENDERNESS IN MOUTH AND THROAT WITH OR WITHOUT PRESENCE OF ULCERS  *URINARY PROBLEMS  *BOWEL PROBLEMS  UNUSUAL RASH Items with * indicate a potential emergency and should be followed up as soon as possible.  Feel free to call the clinic should you have any questions or concerns. The clinic phone number is [phone].  Please show the [place] at check-in to the Emergency Department and triage nurse.  Gemcitabine injection What is this medicine? GEMCITABINE (jem SYE ta been) is a chemotherapy drug. This medicine is used to treat many types of cancer like breast cancer, lung cancer, pancreatic cancer, and ovarian cancer. This medicine may be used for other purposes; ask your health care provider or pharmacist if you have questions. COMMON BRAND NAME(S): Gemzar, Infugem What should I tell my health care provider before I take this medicine? They need to know if you have any of these conditions:  blood disorders  infection  kidney disease  liver disease  lung or breathing disease, like asthma  recent or ongoing radiation therapy  an unusual or allergic reaction to gemcitabine, other chemotherapy, other medicines, foods, dyes, or preservatives  pregnant or trying to get pregnant  breast-feeding How should I use this  medicine? This drug is given as an infusion into a vein. It is administered in a hospital or clinic by a specially trained health care professional. Talk to your pediatrician regarding the use of this medicine in children. Special care may be needed. Overdosage: If you think you have taken too much of this medicine contact a poison control center or emergency room at once. NOTE: This medicine is only for you. Do not share this medicine with others. What if I miss a dose? It is important not to miss your dose. Call your doctor or health care professional if you are unable to keep an appointment. What may interact with this medicine?  medicines to increase blood counts like filgrastim, pegfilgrastim, sargramostim  some other chemotherapy drugs like cisplatin  vaccines Talk to your doctor or health care professional before taking any of these medicines:  acetaminophen  aspirin  ibuprofen  ketoprofen  naproxen This list may not describe all possible interactions. Give your health care provider a list of all the medicines, herbs, non-prescription drugs, or dietary supplements you use. Also tell them if you smoke, drink alcohol, or use illegal drugs. Some items may interact with your medicine. What should I watch for while using this medicine? Visit your doctor for checks on your progress. This drug may make you feel generally unwell. This is not uncommon, as chemotherapy can affect healthy cells as well as cancer cells. Report any side effects. Continue your course of treatment even though you feel ill unless your doctor tells you to stop. In some cases, you may be given additional medicines to help with side effects. Follow all directions  for their use. Call your doctor or health care professional for advice if you get a fever, chills or sore throat, or other symptoms of a cold or flu. Do not treat yourself. This drug decreases your body's ability to fight infections. Try to avoid being  around people who are sick. This medicine may increase your risk to bruise or bleed. Call your doctor or health care professional if you notice any unusual bleeding. Be careful brushing and flossing your teeth or using a toothpick because you may get an infection or bleed more easily. If you have any dental work done, tell your dentist you are receiving this medicine. Avoid taking products that contain aspirin, acetaminophen, ibuprofen, naproxen, or ketoprofen unless instructed by your doctor. These medicines may hide a fever. Do not become pregnant while taking this medicine or for 6 months after stopping it. Women should inform their doctor if they wish to become pregnant or think they might be pregnant. Men should not father a child while taking this medicine and for 3 months after stopping it. There is a potential for serious side effects to an unborn child. Talk to your health care professional or pharmacist for more information. Do not breast-feed an infant while taking this medicine or for at least 1 week after stopping it. Men should inform their doctors if they wish to father a child. This medicine may lower sperm counts. Talk with your doctor or health care professional if you are concerned about your fertility. What side effects may I notice from receiving this medicine? Side effects that you should report to your doctor or health care professional as soon as possible:  allergic reactions like skin rash, itching or hives, swelling of the face, lips, or tongue  breathing problems  pain, redness, or irritation at site where injected  signs and symptoms of a dangerous change in heartbeat or heart rhythm like chest pain; dizziness; fast or irregular heartbeat; palpitations; feeling faint or lightheaded, falls; breathing problems  signs of decreased platelets or bleeding - bruising, pinpoint red spots on the skin, black, tarry stools, blood in the urine  signs of decreased red blood cells -  unusually weak or tired, feeling faint or lightheaded, falls  signs of infection - fever or chills, cough, sore throat, pain or difficulty passing urine  signs and symptoms of kidney injury like trouble passing urine or change in the amount of urine  signs and symptoms of liver injury like dark yellow or brown urine; general ill feeling or flu-like symptoms; light-colored stools; loss of appetite; nausea; right upper belly pain; unusually weak or tired; yellowing of the eyes or skin  swelling of ankles, feet, hands Side effects that usually do not require medical attention (report to your doctor or health care professional if they continue or are bothersome):  constipation  diarrhea  hair loss  loss of appetite  nausea  rash  vomiting This list may not describe all possible side effects. Call your doctor for medical advice about side effects. You may report side effects to FDA at 1-800-FDA-1088. Where should I keep my medicine? This drug is given in a hospital or clinic and will not be stored at home. NOTE: This sheet is a summary. It may not cover all possible information. If you have questions about this medicine, talk to your doctor, pharmacist, or health care provider.  2020 Elsevier/Gold Standard (2017-03-28 18:06:11)  Cisplatin injection What is this medicine? CISPLATIN (SIS pla tin) is a chemotherapy drug. It targets  fast dividing cells, like cancer cells, and causes these cells to die. This medicine is used to treat many types of cancer like bladder, ovarian, and testicular cancers. This medicine may be used for other purposes; ask your health care provider or pharmacist if you have questions. COMMON BRAND NAME(S): Platinol, Platinol -AQ What should I tell my health care provider before I take this medicine? They need to know if you have any of these conditions:  blood disorders  hearing problems  kidney disease  recent or ongoing radiation therapy  an unusual  or allergic reaction to cisplatin, carboplatin, other chemotherapy, other medicines, foods, dyes, or preservatives  pregnant or trying to get pregnant  breast-feeding How should I use this medicine? This drug is given as an infusion into a vein. It is administered in a hospital or clinic by a specially trained health care professional. Talk to your pediatrician regarding the use of this medicine in children. Special care may be needed. Overdosage: If you think you have taken too much of this medicine contact a poison control center or emergency room at once. NOTE: This medicine is only for you. Do not share this medicine with others. What if I miss a dose? It is important not to miss a dose. Call your doctor or health care professional if you are unable to keep an appointment. What may interact with this medicine?  dofetilide  foscarnet  medicines for seizures  medicines to increase blood counts like filgrastim, pegfilgrastim, sargramostim  probenecid  pyridoxine used with altretamine  rituximab  some antibiotics like amikacin, gentamicin, neomycin, polymyxin B, streptomycin, tobramycin  sulfinpyrazone  vaccines  zalcitabine Talk to your doctor or health care professional before taking any of these medicines:  acetaminophen  aspirin  ibuprofen  ketoprofen  naproxen This list may not describe all possible interactions. Give your health care provider a list of all the medicines, herbs, non-prescription drugs, or dietary supplements you use. Also tell them if you smoke, drink alcohol, or use illegal drugs. Some items may interact with your medicine. What should I watch for while using this medicine? Your condition will be monitored carefully while you are receiving this medicine. You will need important blood work done while you are taking this medicine. This drug may make you feel generally unwell. This is not uncommon, as chemotherapy can affect healthy cells as well  as cancer cells. Report any side effects. Continue your course of treatment even though you feel ill unless your doctor tells you to stop. In some cases, you may be given additional medicines to help with side effects. Follow all directions for their use. Call your doctor or health care professional for advice if you get a fever, chills or sore throat, or other symptoms of a cold or flu. Do not treat yourself. This drug decreases your body's ability to fight infections. Try to avoid being around people who are sick. This medicine may increase your risk to bruise or bleed. Call your doctor or health care professional if you notice any unusual bleeding. Be careful brushing and flossing your teeth or using a toothpick because you may get an infection or bleed more easily. If you have any dental work done, tell your dentist you are receiving this medicine. Avoid taking products that contain aspirin, acetaminophen, ibuprofen, naproxen, or ketoprofen unless instructed by your doctor. These medicines may hide a fever. Do not become pregnant while taking this medicine. Women should inform their doctor if they wish to become pregnant or  think they might be pregnant. There is a potential for serious side effects to an unborn child. Talk to your health care professional or pharmacist for more information. Do not breast-feed an infant while taking this medicine. Drink fluids as directed while you are taking this medicine. This will help protect your kidneys. Call your doctor or health care professional if you get diarrhea. Do not treat yourself. What side effects may I notice from receiving this medicine? Side effects that you should report to your doctor or health care professional as soon as possible:  allergic reactions like skin rash, itching or hives, swelling of the face, lips, or tongue  signs of infection - fever or chills, cough, sore throat, pain or difficulty passing urine  signs of decreased  platelets or bleeding - bruising, pinpoint red spots on the skin, black, tarry stools, nosebleeds  signs of decreased red blood cells - unusually weak or tired, fainting spells, lightheadedness  breathing problems  changes in hearing  gout pain  low blood counts - This drug may decrease the number of white blood cells, red blood cells and platelets. You may be at increased risk for infections and bleeding.  nausea and vomiting  pain, swelling, redness or irritation at the injection site  pain, tingling, numbness in the hands or feet  problems with balance, movement  trouble passing urine or change in the amount of urine Side effects that usually do not require medical attention (report to your doctor or health care professional if they continue or are bothersome):  changes in vision  loss of appetite  metallic taste in the mouth or changes in taste This list may not describe all possible side effects. Call your doctor for medical advice about side effects. You may report side effects to FDA at 1-800-FDA-1088. Where should I keep my medicine? This drug is given in a hospital or clinic and will not be stored at home. NOTE: This sheet is a summary. It may not cover all possible information. If you have questions about this medicine, talk to your doctor, pharmacist, or health care provider.  2020 Elsevier/Gold Standard (2007-04-09 14:40:54)

## 2018-08-13 NOTE — Progress Notes (Signed)
Pt is approved for the $700 [facility] and $200 Prostate grants.

## 2018-08-14 ENCOUNTER — Telehealth: Payer: Self-pay

## 2018-08-14 ENCOUNTER — Other Ambulatory Visit: Payer: Self-pay | Admitting: Oncology

## 2018-08-14 LAB — PROSTATE-SPECIFIC AG, SERUM (LABCORP): Prostate Specific Ag, Serum: <0.1 ng/mL (ref 0.0–4.0)

## 2018-08-14 NOTE — Telephone Encounter (Signed)
Attempted chemo follow up call. No answer. Left voice mail for patient to return call to the office with any questions or concerns.

## 2018-08-14 NOTE — Telephone Encounter (Signed)
-----   Message from Veverly Fells, RN sent at 08/13/2018  4:09 PM EDT ----- Regarding: SHADAD: First time follow-up Dwyane Luo is a patient of MD Ravenel. He received Gemzar and Cisplatin today with no complaints.

## 2018-08-16 ENCOUNTER — Telehealth: Payer: Self-pay | Admitting: *Deleted

## 2018-08-16 NOTE — Telephone Encounter (Signed)
[employer]mym[employer] faxed Prior authorization request for [id number] for [facility].  Request to Managed [employer] letter tray receptacle of Prior Authorization forms for review.

## 2018-08-20 ENCOUNTER — Inpatient Hospital Stay: Payer: PRIVATE HEALTH INSURANCE | Admitting: Nutrition

## 2018-08-20 ENCOUNTER — Other Ambulatory Visit: Payer: Self-pay

## 2018-08-20 ENCOUNTER — Inpatient Hospital Stay: Payer: PRIVATE HEALTH INSURANCE | Attending: Oncology

## 2018-08-20 ENCOUNTER — Inpatient Hospital Stay: Payer: PRIVATE HEALTH INSURANCE

## 2018-08-20 VITALS — BP 118/84 | HR 118 | Temp 98.5°F | Resp 18

## 2018-08-20 DIAGNOSIS — Z79899 Other long term (current) drug therapy: Secondary | ICD-10-CM | POA: Diagnosis not present

## 2018-08-20 DIAGNOSIS — Z9221 Personal history of antineoplastic chemotherapy: Secondary | ICD-10-CM | POA: Insufficient documentation

## 2018-08-20 DIAGNOSIS — C61 Malignant neoplasm of prostate: Secondary | ICD-10-CM

## 2018-08-20 DIAGNOSIS — Z95828 Presence of other vascular implants and grafts: Secondary | ICD-10-CM

## 2018-08-20 DIAGNOSIS — C7951 Secondary malignant neoplasm of bone: Secondary | ICD-10-CM | POA: Diagnosis not present

## 2018-08-20 DIAGNOSIS — C679 Malignant neoplasm of bladder, unspecified: Secondary | ICD-10-CM | POA: Insufficient documentation

## 2018-08-20 DIAGNOSIS — F418 Other specified anxiety disorders: Secondary | ICD-10-CM | POA: Diagnosis not present

## 2018-08-20 DIAGNOSIS — E860 Dehydration: Secondary | ICD-10-CM | POA: Insufficient documentation

## 2018-08-20 DIAGNOSIS — Z923 Personal history of irradiation: Secondary | ICD-10-CM | POA: Insufficient documentation

## 2018-08-20 DIAGNOSIS — R627 Adult failure to thrive: Secondary | ICD-10-CM | POA: Insufficient documentation

## 2018-08-20 DIAGNOSIS — Z5111 Encounter for antineoplastic chemotherapy: Secondary | ICD-10-CM | POA: Insufficient documentation

## 2018-08-20 DIAGNOSIS — C7800 Secondary malignant neoplasm of unspecified lung: Secondary | ICD-10-CM | POA: Diagnosis not present

## 2018-08-20 LAB — CMP (CANCER CENTER ONLY)
ALT: 74 U/L — ABNORMAL HIGH (ref 0–44)
AST: 19 U/L (ref 15–41)
Albumin: 1.9 g/dL — ABNORMAL LOW (ref 3.5–5.0)
Alkaline Phosphatase: 283 U/L — ABNORMAL HIGH (ref 38–126)
Anion gap: 11 (ref 5–15)
BUN: 22 mg/dL — ABNORMAL HIGH (ref 6–20)
CO2: 22 mmol/L (ref 22–32)
Calcium: 9.2 mg/dL (ref 8.9–10.3)
Chloride: 99 mmol/L (ref 98–111)
Creatinine: 0.67 mg/dL (ref 0.61–1.24)
GFR, Est AFR Am: >60 mL/min (ref >60)
GFR, Estimated: >60 mL/min (ref >60)
Glucose, Bld: 163 mg/dL — ABNORMAL HIGH (ref 70–99)
Potassium: 4.9 mmol/L (ref 3.5–5.1)
Sodium: 132 mmol/L — ABNORMAL LOW (ref 135–145)
Total Bilirubin: 0.4 mg/dL (ref 0.3–1.2)
Total Protein: 6.5 g/dL (ref 6.5–8.1)

## 2018-08-20 LAB — CBC WITH DIFFERENTIAL (CANCER CENTER ONLY)
Abs Immature Granulocytes: 0.31 10*3/uL — ABNORMAL HIGH (ref 0.00–0.07)
Basophils Absolute: 0 10*3/uL (ref 0.0–0.1)
Basophils Relative: 0 %
Eosinophils Absolute: 0 10*3/uL (ref 0.0–0.5)
Eosinophils Relative: 0 %
HCT: 35.6 % — ABNORMAL LOW (ref 39.0–52.0)
Hemoglobin: 11.4 g/dL — ABNORMAL LOW (ref 13.0–17.0)
Immature Granulocytes: 6 %
Lymphocytes Relative: 5 %
Lymphs Abs: 0.2 10*3/uL — ABNORMAL LOW (ref 0.7–4.0)
MCH: 29.8 pg (ref 26.0–34.0)
MCHC: 32 g/dL (ref 30.0–36.0)
MCV: 93.2 fL (ref 80.0–100.0)
Monocytes Absolute: 0.2 10*3/uL (ref 0.1–1.0)
Monocytes Relative: 4 %
Neutro Abs: 4.4 10*3/uL (ref 1.7–7.7)
Neutrophils Relative %: 85 %
Platelet Count: 112 10*3/uL — ABNORMAL LOW (ref 150–400)
RBC: 3.82 MIL/uL — ABNORMAL LOW (ref 4.22–5.81)
RDW: 13.2 % (ref 11.5–15.5)
WBC Count: 5.2 10*3/uL (ref 4.0–10.5)
nRBC: 0.6 % — ABNORMAL HIGH (ref 0.0–0.2)

## 2018-08-20 MED ORDER — SODIUM CHLORIDE 0.9% FLUSH
10.0000 mL | Freq: Once | INTRAVENOUS | Status: AC
  Administered 2018-08-20: 10 mL
  Filled 2018-08-20: qty 10

## 2018-08-20 MED ORDER — SODIUM CHLORIDE 0.9% FLUSH
10.0000 mL | INTRAVENOUS | Status: DC | PRN
  Administered 2018-08-20: 17:00:00 10 mL
  Filled 2018-08-20: qty 10

## 2018-08-20 MED ORDER — PROCHLORPERAZINE MALEATE 10 MG PO TABS
ORAL_TABLET | ORAL | Status: AC
  Filled 2018-08-20: qty 1

## 2018-08-20 MED ORDER — SODIUM CHLORIDE 0.9 % IV SOLN: 1000.0000 mg/m2 | Freq: Once | INTRAVENOUS | Status: DC

## 2018-08-20 MED ORDER — PROCHLORPERAZINE MALEATE 10 MG PO TABS
10.0000 mg | ORAL_TABLET | Freq: Once | ORAL | Status: AC
  Administered 2018-08-20: 10 mg via ORAL

## 2018-08-20 MED ORDER — SODIUM CHLORIDE 0.9 % IV SOLN
2000.0000 mg | Freq: Once | INTRAVENOUS | Status: AC
  Administered 2018-08-20: 16:00:00 2000 mg via INTRAVENOUS
  Filled 2018-08-20: qty 52.6

## 2018-08-20 MED ORDER — HEPARIN SOD (PORK) LOCK FLUSH 100 UNIT/ML IV SOLN
500.0000 [IU] | Freq: Once | INTRAVENOUS | Status: AC | PRN
  Administered 2018-08-20: 17:00:00 500 [IU]
  Filled 2018-08-20: qty 5

## 2018-08-20 MED ORDER — SODIUM CHLORIDE 0.9 % IV SOLN
Freq: Once | INTRAVENOUS | Status: AC
  Administered 2018-08-20: 16:00:00 via INTRAVENOUS
  Filled 2018-08-20: qty 250

## 2018-08-20 NOTE — Patient Instructions (Signed)

## 2018-08-20 NOTE — Progress Notes (Signed)
48 year old male diagnosed with stage IV bladder cancer in June 2019, prostate cancer in July 2019, with widespread metastasis diagnosed in July 2020.  Past medical history includes heartburn, depression, anxiety, palliative chemotherapy and palliative radiation therapy.  Medications include Mylanta, vitamin B12, Decadron, Cymbalta, multivitamin, Zofran, Protonix, MiraLAX, and Senokot-S.  Labs were reviewed.  Height: 6 feet 2 inches. Weight: 170 pounds July 22. Usual body weight: 194 pounds in June 2020. BMI: 21.83.  Patient has a decreased appetite.  He has experienced nausea and vomiting. His biggest complaint is increased pain which seems to affect his oral intake. He really does not care for oral nutrition supplements but he is willing to try.  He does enjoy regular milk.  Nutrition diagnosis: Unintended weight loss related to poor appetite and inadequate oral intake as evidenced by 24 pound weight loss over less than 2 months.  Intervention: Educated patient to consume small meals with 3 snacks in between meals. Reviewed strategies for increasing calories and protein. Provided oral nutrition supplements and educated patient on strategies for making milkshakes or smoothies. Recommended patient also try Carnation breakfast essentials. Provided fact sheets.  Questions were answered.  Teach back method used.  Contact information given.  Monitoring, evaluation, goals: Patient will tolerate increased calories and protein to promote weight maintenance.  Next visit: Tuesday, August 18 during infusion.

## 2018-08-20 NOTE — Progress Notes (Signed)
Per Dr [name] ok to tx with elevated heart rate today.

## 2018-08-20 NOTE — Patient Instructions (Signed)
[facility] °Discharge Instructions for Patients Receiving Chemotherapy ° °Today you received the following chemotherapy agents Gemzar ° °To help prevent nausea and vomiting after your treatment, we encourage you to take your nausea medication as directed. °  °If you develop nausea and vomiting that is not controlled by your nausea medication, call the clinic.  ° °BELOW ARE SYMPTOMS THAT SHOULD BE REPORTED IMMEDIATELY: °· *FEVER GREATER THAN 100.5 F °· *CHILLS WITH OR WITHOUT FEVER °· NAUSEA AND VOMITING THAT IS NOT CONTROLLED WITH YOUR NAUSEA MEDICATION °· *UNUSUAL SHORTNESS OF BREATH °· *UNUSUAL BRUISING OR BLEEDING °· TENDERNESS IN MOUTH AND THROAT WITH OR WITHOUT PRESENCE OF ULCERS °· *URINARY PROBLEMS °· *BOWEL PROBLEMS °· UNUSUAL RASH °Items with * indicate a potential emergency and should be followed up as soon as possible. ° °Feel free to call the clinic should you have any questions or concerns. The clinic phone number is [phone]. ° °Please show the CHEMO ALERT CARD at check-in to the Emergency Department and triage nurse. ° ° °

## 2018-08-21 ENCOUNTER — Telehealth: Payer: Self-pay

## 2018-08-21 NOTE — Telephone Encounter (Signed)
Received call from patient daughter following up on prior authorization for fentanyl patches as the patient has been paying out of pocket. Explained that the prior auth has been sent and the fentanyl has been approved. She then stated that she received an email that the [facility] documentation was filled out incorrectly and they sent her an attachment with the forms and areas that have to be corrected. Nira Conn stated that she does not have a fax machine so she was asked to drop off the forms so they can be corrected and resubmitted. Heather verbalized understanding and Thompson Caul LPN made aware.

## 2018-08-28 ENCOUNTER — Other Ambulatory Visit: Payer: Self-pay | Admitting: Oncology

## 2018-08-28 MED ORDER — HYDROMORPHONE HCL 2 MG PO TABS: 2.0000 mg | ORAL_TABLET | ORAL | 0 refills | Status: DC | PRN

## 2018-08-29 ENCOUNTER — Other Ambulatory Visit: Payer: Self-pay

## 2018-08-29 ENCOUNTER — Telehealth: Payer: Self-pay

## 2018-08-29 ENCOUNTER — Other Ambulatory Visit: Payer: Self-pay | Admitting: Oncology

## 2018-08-29 MED ORDER — DULOXETINE HCL 30 MG PO CPEP: 30.0000 mg | ORAL_CAPSULE | Freq: Every day | ORAL | 0 refills | Status: DC

## 2018-08-29 MED ORDER — ONDANSETRON HCL 4 MG PO TABS: 4.0000 mg | ORAL_TABLET | Freq: Four times a day (QID) | ORAL | 0 refills | Status: DC | PRN

## 2018-08-29 MED ORDER — MEGESTROL ACETATE 400 MG/10ML PO SUSP: 400.0000 mg | Freq: Two times a day (BID) | ORAL | 0 refills | Status: DC

## 2018-08-29 MED ORDER — PANTOPRAZOLE SODIUM 40 MG PO TBEC: 40.0000 mg | DELAYED_RELEASE_TABLET | Freq: Every day | ORAL | 0 refills | Status: DC

## 2018-08-29 NOTE — Telephone Encounter (Signed)
-----   Message from [name], MD sent at 08/29/2018  9:53 AM EDT ----- He needs to increase oral hydration.  He needs to start daily MiraLAX and we can send a prescription if needed. Please send a prescription for Megace as well to boost his appetite. The sleepiness in the generalized pain is expected related to his cancer.  I will address further next visit.  Thanks.  ----- Message ----- From: [name], RN Sent: 08/29/2018   9:08 AM EDT To: [name], MD  When I called patient's family member to let her know refills were sent in for Protonix and Cymbalta she stated she forgot to mention that patient has had bloody urine x 2 days. No bowel movement x 3 days and no appetite. She stated liquid intake is decreased and patient sleeps a lot. Denies fever but states patient has generalized pain.  Rodney [name]

## 2018-08-29 NOTE — Telephone Encounter (Signed)
Called and spoke to patient's friend and let her know that per Dr. [name], the sleepiness and pain is expected related to patient's cancer, a prescription for Megace will be sent to pharmacy, increase oral hydration, start Miralax and issues will be addressed at the next visit. [name] verbalized understanding and will call the office with any further questions or concerns or if symptoms worsen.

## 2018-08-29 NOTE — Telephone Encounter (Signed)
-----   Message from [name], MD sent at 08/29/2018  8:57 AM EDT ----- Ok to refill. Thanks.  ----- Message ----- From: [name], RN Sent: 08/29/2018   8:38 AM EDT To: [name], MD  Refill request for Cymbalta and Protonix. These were prescribed by O.Sheikh,DO when patient was discharged from the hospital. Is it ok to refill these or does patient need to go through PCP? Rodney [name]

## 2018-08-30 ENCOUNTER — Telehealth: Payer: Self-pay | Admitting: Radiation Oncology

## 2018-08-30 NOTE — Telephone Encounter (Signed)
  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  Narrative:  The patient was contacted today for routine follow-up. During treatment he did very well with radiotherapy and did not have significant desquamation. He reports he has noticed pain improvement since radiotherapy ended. He is in the process of receiving palliative platin based chemotherapy with Dr. [name].  Impression/Plan: 1. 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 The patient has been doing well since completion of radiotherapy. We discussed that we would be happy to continue to follow her as needed, but he will also continue to follow up with Dr. [name] in medical oncology.     Carola Rhine, PAC

## 2018-08-31 ENCOUNTER — Other Ambulatory Visit: Payer: Self-pay

## 2018-08-31 ENCOUNTER — Emergency Department (HOSPITAL_COMMUNITY)
Admission: EM | Admit: 2018-08-31 | Discharge: 2018-08-31 | Disposition: A | Discharge status: Home / Self Care | Payer: PRIVATE HEALTH INSURANCE | Attending: Emergency Medicine | Admitting: Emergency Medicine

## 2018-08-31 ENCOUNTER — Encounter (HOSPITAL_COMMUNITY): Payer: Self-pay | Admitting: Emergency Medicine

## 2018-08-31 DIAGNOSIS — E860 Dehydration: Secondary | ICD-10-CM | POA: Insufficient documentation

## 2018-08-31 DIAGNOSIS — Z79899 Other long term (current) drug therapy: Secondary | ICD-10-CM | POA: Insufficient documentation

## 2018-08-31 DIAGNOSIS — Z87891 Personal history of nicotine dependence: Secondary | ICD-10-CM | POA: Diagnosis not present

## 2018-08-31 LAB — COMPREHENSIVE METABOLIC PANEL
ALT: 38 U/L (ref 0–44)
AST: 21 U/L (ref 15–41)
Albumin: 2.8 g/dL — ABNORMAL LOW (ref 3.5–5.0)
Alkaline Phosphatase: 347 U/L — ABNORMAL HIGH (ref 38–126)
Anion gap: 10 (ref 5–15)
BUN: 82 mg/dL — ABNORMAL HIGH (ref 6–20)
CO2: 20 mmol/L — ABNORMAL LOW (ref 22–32)
Calcium: 9.6 mg/dL (ref 8.9–10.3)
Chloride: 104 mmol/L (ref 98–111)
Creatinine, Ser: 1.33 mg/dL — ABNORMAL HIGH (ref 0.61–1.24)
GFR calc Af Amer: >60 mL/min (ref >60)
GFR calc non Af Amer: >60 mL/min (ref >60)
Glucose, Bld: 133 mg/dL — ABNORMAL HIGH (ref 70–99)
Potassium: 5.3 mmol/L — ABNORMAL HIGH (ref 3.5–5.1)
Sodium: 134 mmol/L — ABNORMAL LOW (ref 135–145)
Total Bilirubin: 1.1 mg/dL (ref 0.3–1.2)
Total Protein: 7 g/dL (ref 6.5–8.1)

## 2018-08-31 LAB — URINALYSIS, ROUTINE W REFLEX MICROSCOPIC
Bilirubin Urine: NEGATIVE
Glucose, UA: NEGATIVE mg/dL
Ketones, ur: NEGATIVE mg/dL
Nitrite: NEGATIVE
Protein, ur: >=300 mg/dL — AB
Specific Gravity, Urine: 1.011 (ref 1.005–1.030)
pH: 9 — ABNORMAL HIGH (ref 5.0–8.0)

## 2018-08-31 LAB — CBC WITH DIFFERENTIAL/PLATELET
Abs Immature Granulocytes: 1.75 10*3/uL — ABNORMAL HIGH (ref 0.00–0.07)
Basophils Absolute: 0 10*3/uL (ref 0.0–0.1)
Basophils Relative: 0 %
Eosinophils Absolute: 0 10*3/uL (ref 0.0–0.5)
Eosinophils Relative: 0 %
HCT: 38.1 % — ABNORMAL LOW (ref 39.0–52.0)
Hemoglobin: 12.1 g/dL — ABNORMAL LOW (ref 13.0–17.0)
Immature Granulocytes: 20 %
Lymphocytes Relative: 10 %
Lymphs Abs: 0.9 10*3/uL (ref 0.7–4.0)
MCH: 30.3 pg (ref 26.0–34.0)
MCHC: 31.8 g/dL (ref 30.0–36.0)
MCV: 95.3 fL (ref 80.0–100.0)
Monocytes Absolute: 0.5 10*3/uL (ref 0.1–1.0)
Monocytes Relative: 6 %
Neutro Abs: 5.7 10*3/uL (ref 1.7–7.7)
Neutrophils Relative %: 64 %
Platelets: 368 10*3/uL (ref 150–400)
RBC: 4 MIL/uL — ABNORMAL LOW (ref 4.22–5.81)
RDW: 17.6 % — ABNORMAL HIGH (ref 11.5–15.5)
WBC: 9.1 10*3/uL (ref 4.0–10.5)
nRBC: 1.8 % — ABNORMAL HIGH (ref 0.0–0.2)

## 2018-08-31 MED ORDER — SODIUM CHLORIDE 0.9 % IV BOLUS
1000.0000 mL | Freq: Once | INTRAVENOUS | Status: AC
  Administered 2018-08-31: 1000 mL via INTRAVENOUS

## 2018-08-31 NOTE — ED Notes (Signed)
Lab in to draw

## 2018-08-31 NOTE — ED Notes (Signed)
Fiance is coming to take pt home will be here I 30 minutes   Registration informed

## 2018-08-31 NOTE — ED Triage Notes (Signed)
Poor intake with about 20 cc out today   Pt is cancer pt prostate with mets   [place] and advised to come for fluids  Treatment in [facility]

## 2018-08-31 NOTE — ED Notes (Signed)
PA in to discuss findings 

## 2018-08-31 NOTE — ED Notes (Signed)
Lynett Fish [phone]

## 2018-08-31 NOTE — ED Notes (Signed)
  Call to lab re: urine culture   Pt mask changed due to mask tightened and disrupted   Pt is awaiting his fiance to come and pick him up

## 2018-08-31 NOTE — Discharge Instructions (Signed)
It is very important that you are drinking more water. Try taking half tablets of some of your pain medication so that you are awake enough to drink. Follow up closely for recheck with your doctor next week. (Monday or Tuesday). Return to the ER if you have any new, worsening, or concerning symptoms.

## 2018-08-31 NOTE — ED Provider Notes (Addendum)
[facility] EMERGENCY DEPARTMENT Provider Note   CSN: [id number] Arrival date & time: 08/31/18  1808     History   Chief Complaint Chief Complaint  Patient presents with  . Dehydration    HPI Rodney [name] is a 48 y.o. male presenting for evaluation of dehydration.  Patient states he has not been eating or drinking very much due to excessive sleepiness from narcotic medicine.  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   He states he cannot stay awake long enough to get sufficient oral intake.  As such, he has had very little to no urine output.  He is oncologist recommended he come to the ER for fluids.  He denies fevers, chills, cough, nausea, vomiting, worsened abdominal pain.  Additional history obtained from chart review.  Patient with a history of cancer with mets to the cervical spine, recently had surgery for port placement for palliative chemo.      HPI  Past Medical History:  Diagnosis Date  . Anxiety   . Bladder tumor   . Blood in urine    saw some 3 days ago but none inthe last 2 days   . Bradycardia   . Cancer ([place])   . Depression   . Dizziness    historical   . Elevated PSA   . Family history of colon cancer   . Family history of gastric cancer   . Family history of melanoma   . Family history of prostate cancer   . Heartburn    occasional   . Pain with urination   . Prostate cancer ([place])   . Urothelial cancer ([place])   . Urothelial carcinoma [facility])     Patient Active Problem List   Diagnosis Date Noted  . Port-A-Cath in place 08/13/2018  . 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 [facility])   . Pain   . Metastatic cancer ([place])   . Goals of care, counseling/discussion   . Intractable pain 07/20/2018  . Constipation 07/20/2018  . Acute lower UTI 07/20/2018  . Leukocytosis 07/20/2018  . Spine metastasis ([place]) 07/16/2018  . Status post radical  cystoprostatectomy 11/08/2017  . Bladder cancer ([place]) 11/08/2017  . Genetic testing 10/26/2017  . Family history of prostate cancer   . Family history of gastric cancer   . Family history of melanoma   . Family history of colon cancer   . Urothelial cancer ([place])   . Malignant neoplasm of prostate ([place]) 09/11/2017    Past Surgical History:  Procedure Laterality Date  . BACK SURGERY  2012   3 disc; dr Carloyn Manner  did first and dr elsner did the last 2   . CYSTOSCOPY W/ RETROGRADES N/A 07/02/2017   Procedure: CYSTOSCOPY WITH RETROGRADE PYELOGRAM/EXAM UNDER ANESTHESIA;  Surgeon: Raynelle Bring, MD;  Location: [facility];  Service: Urology;  Laterality: N/A;  . CYSTOSCOPY WITH BIOPSY N/A 08/09/2017   Procedure: CYSTOSCOPY WITH PROSTATE NEEDLE BIOPSY;  Surgeon: Raynelle Bring, MD;  Location: [facility];  Service: Urology;  Laterality: N/A;  GENERAL ANESTHESIA WITH PARALYSIS/ ONLY NEEDS 60 MIN FOR ALL PROCEDURES  . IR IMAGING GUIDED PORT INSERTION  08/07/2018  . LEG SURGERY  2012   4 metal plates in plates   . TRANSRECTAL ULTRASOUND N/A 08/09/2017   Procedure: TRANSRECTAL ULTRASOUND;  Surgeon: Raynelle Bring, MD;  Location: [facility];  Service: Urology;  Laterality: N/A;  ONLY NEEDS 60 MIN FOR ALL PROCEDURES  .  TRANSURETHRAL RESECTION OF BLADDER TUMOR N/A 07/02/2017   Procedure: TRANSURETHRAL RESECTION OF BLADDER TUMOR (TURBT);  Surgeon: Raynelle Bring, MD;  Location: [facility];  Service: Urology;  Laterality: N/A;  . TRANSURETHRAL RESECTION OF BLADDER TUMOR N/A 08/09/2017   Procedure: TRANSURETHRAL RESECTION OF BLADDER TUMOR (TURBT);  Surgeon: Raynelle Bring, MD;  Location: [facility];  Service: Urology;  Laterality: N/A;        Home Medications    Prior to Admission medications   Medication Sig Start Date End Date Taking? Authorizing Provider  dexamethasone (DECADRON) 4 MG tablet Take 1 tablet (4 mg total) by mouth 2 (two) times daily. 08/02/18 09/01/18 Yes Sheikh, Omair Latif, DO  DULoxetine (CYMBALTA) 30 MG  capsule Take 1 capsule (30 mg total) by mouth daily. 08/29/18  Yes [name], MD  fentaNYL (DURAGESIC) 100 MCG/HR Place 1 patch onto the skin every 3 (three) days. 08/05/18  Yes [name], MD  HYDROmorphone (DILAUDID) 2 MG tablet Take 1 tablet (2 mg total) by mouth every 4 (four) hours as needed for severe pain. 08/28/18  Yes [name], MD  lidocaine-prilocaine (EMLA) cream Apply 1 application topically as needed. 08/05/18  Yes [name], MD  megestrol (MEGACE) 400 MG/10ML suspension Take 10 mLs (400 mg total) by mouth 2 (two) times daily. 08/29/18  Yes [name], MD  ondansetron (ZOFRAN) 4 MG tablet Take 1 tablet (4 mg total) by mouth every 6 (six) hours as needed for nausea. 08/29/18  Yes [name], MD  pantoprazole (PROTONIX) 40 MG tablet Take 1 tablet (40 mg total) by mouth daily. 08/29/18  Yes Shadad, Mathis Dad, MD  senna-docusate (SENOKOT-S) 8.6-50 MG tablet Take 2 tablets by mouth 2 (two) times daily. 08/02/18  Yes Sheikh, Omair Latif, DO  acetaminophen (TYLENOL) 325 MG tablet Take 2 tablets (650 mg total) by mouth every 6 (six) hours as needed for mild pain (or Fever >/= 101). 08/02/18   Raiford Noble Latif, DO  alum & mag hydroxide-simeth (MAALOX/MYLANTA) 200-200-20 MG/5ML suspension Take 15 mLs by mouth every 6 (six) hours as needed for indigestion or heartburn. 08/02/18   Raiford Noble Latif, DO  cyanocobalamin (,VITAMIN B-12,) 1000 MCG/ML injection Inject 1,000 mcg into the muscle every 30 (thirty) days.    [provider]  cyclobenzaprine (FLEXERIL) 10 MG tablet Take 10 mg by mouth 3 (three) times daily.    [provider]  Multiple Vitamin (MULTIVITAMIN WITH MINERALS) TABS tablet Take 1 tablet by mouth daily.    [provider]  naproxen sodium (ALEVE) 220 MG tablet Take 220-440 mg by mouth 2 (two) times daily as needed.    [provider]  polyethylene glycol (MIRALAX / GLYCOLAX) 17 g packet Take 17 g by mouth 2 (two) times  daily. 08/02/18   Raiford Noble Latif, DO  sildenafil (VIAGRA) 100 MG tablet Take 100 mg by mouth daily as needed. 03/22/18   [provider]  vitamin B-12 (CYANOCOBALAMIN) 1000 MCG tablet Take 1,000 mcg by mouth 2 (two) times a day.    [provider]    Family History Family History  Problem Relation Age of Onset  . Heart attack Father   . Hypertension Father   . Diabetes Father   . Heart failure Father   . Hypertension Mother   . Gastric cancer Sister 87       d. 69  . Prostate cancer Brother 56  . Melanoma Maternal Aunt   . Stroke Maternal Uncle   .  Stroke Paternal Uncle   . Melanoma Maternal Grandmother        dx under 39  . Colon cancer Maternal Grandmother   . Heart disease Maternal Grandfather   . Heart attack Paternal Grandfather     Social History Social History   Tobacco Use  . Smoking status: Former Smoker    Packs/day: 2.00    Years: 8.00    Pack years: 16.00    Types: Cigarettes    Quit date: 05/29/2016    Years since quitting: 2.2  . Smokeless tobacco: Former Systems developer  . Tobacco comment: quti 2009  Substance Use Topics  . Alcohol use: Not Currently  . Drug use: Not Currently    Types: Marijuana    Comment: last use was 05-29-2017     Allergies   Morphine and related and Vicodin [hydrocodone-acetaminophen]   Review of Systems Review of Systems  Neurological: Positive for weakness.  All other systems reviewed and are negative.    Physical Exam Updated Vital Signs BP 109/83   Pulse (!) 103   Temp (!) 97.5 F (36.4 C) (Oral)   Resp 15   Ht 5\' 10"  (1.778 m)   Wt 65.8 kg   SpO2 100%   BMI 20.81 kg/m   Physical Exam Vitals signs and nursing note reviewed.  Constitutional:      General: He is not in acute distress.    Appearance: He is well-developed.     Comments: Cachectic male who appears chronically ill and dehydrated.  MM dry  HENT:     Head: Normocephalic and atraumatic.     Mouth/Throat:     Mouth: Mucous membranes  are dry.  Eyes:     Conjunctiva/sclera: Conjunctivae normal.     Pupils: Pupils are equal, round, and reactive to light.  Neck:     Musculoskeletal: Normal range of motion and neck supple.  Cardiovascular:     Rate and Rhythm: Regular rhythm. Tachycardia present.     Pulses: Normal pulses.     Comments: Tachycardic around 115 Pulmonary:     Effort: Pulmonary effort is normal. No respiratory distress.     Breath sounds: Normal breath sounds. No wheezing.  Abdominal:     General: There is no distension.     Palpations: Abdomen is soft. There is no mass.     Tenderness: There is no abdominal tenderness. There is no guarding or rebound.  Genitourinary:    Comments: Foley in place. Urine dark yellow with very little output Musculoskeletal: Normal range of motion.  Skin:    General: Skin is warm and dry.     Capillary Refill: Capillary refill takes less than 2 seconds.  Neurological:     Mental Status: He is alert and oriented to person, place, and time.      ED Treatments / Results  Labs (all labs ordered are listed, but only abnormal results are displayed) Labs Reviewed  CBC WITH DIFFERENTIAL/PLATELET - Abnormal; Notable for the following components:      Result Value   RBC 4.00 (*)    Hemoglobin 12.1 (*)    HCT 38.1 (*)    RDW 17.6 (*)    nRBC 1.8 (*)    Abs Immature Granulocytes 1.75 (*)    All other components within normal limits  COMPREHENSIVE METABOLIC PANEL - Abnormal; Notable for the following components:   Sodium 134 (*)    Potassium 5.3 (*)    CO2 20 (*)    Glucose, Bld 133 (*)  BUN 82 (*)    Creatinine, Ser 1.33 (*)    Albumin 2.8 (*)    Alkaline Phosphatase 347 (*)    All other components within normal limits  URINALYSIS, ROUTINE W REFLEX MICROSCOPIC - Abnormal; Notable for the following components:   APPearance TURBID (*)    pH 9.0 (*)    Hgb urine dipstick SMALL (*)    Protein, ur >=300 (*)    Leukocytes,Ua SMALL (*)    Bacteria, UA MANY (*)     All other components within normal limits    EKG None  Radiology No results found.  Procedures Procedures (including critical care time)  Medications Ordered in ED Medications  sodium chloride 0.9 % bolus 1,000 mL (0 mLs Intravenous Stopped 08/31/18 2010)  sodium chloride 0.9 % bolus 1,000 mL (1,000 mLs Intravenous New Bag/Given 08/31/18 2015)     Initial Impression / Assessment and Plan / ED Course  I have reviewed the triage vital signs and the nursing notes.  Pertinent labs & imaging results that were available during my care of the patient were reviewed by me and considered in my medical decision making (see chart for details).        Pt presenting for evaluation of poor p.o. and urine output.  Physical exam shows patient who appears chronically ill, very dehydrated.  MM very dry, patient tachycardic around 115.  Likely poor urine output due to poor intake.  Will obtain labs and start a bolus of fluids.  Labs consistent with dehydration.  Mild AKI and increase in BUN.  Urine with small leuks and many bacteria.  However, patient without a white count or fever.  He does have a catheter.  As such, will send for culture, but will not treat at this time.  On reassessment after 1 L, patient appears more perky.  Heart rate improved to 105.  Patient states he feels better.  Discussed findings with patient.  Discussed option of admission, patient states his grandson who he rarely sees is visiting and he would like to see him.  Discussed giving a second bolus, making sure he can tolerate p.o., and then close monitoring of symptoms at home.  If he worsens, he should return to the emergency room.  Patient is agreeable to this plan.  On reassessment after second bolus, heart rate is improved, 95 on my exam.  Patient appears as if he feels better, and states he is feeling much better.  He is more alert.  I discussed taking half doses of pain medication to decrease sleepiness, but still  controlling pain.  I encouraged increased p.o. intake.  Discussed findings and plan with patient's fianc, who is agreeable.  At this time, I am agreeable to discharge with close monitoring.  Strict return precautions given.  Patient states he understands and agrees plan.  Final Clinical Impressions(s) / ED Diagnoses   Final diagnoses:  Dehydration    ED Discharge Orders    None       Franchot Heidelberg, PA-C 08/31/18 2116    Franchot Heidelberg, PA-C 08/31/18 2116    Milton Ferguson, MD 09/03/18 1316

## 2018-08-31 NOTE — ED Notes (Signed)
Pt requested a popcycle  Provided

## 2018-08-31 NOTE — ED Notes (Signed)
Family member to room

## 2018-09-02 ENCOUNTER — Inpatient Hospital Stay (HOSPITAL_BASED_OUTPATIENT_CLINIC_OR_DEPARTMENT_OTHER): Payer: PRIVATE HEALTH INSURANCE | Admitting: Oncology

## 2018-09-02 ENCOUNTER — Other Ambulatory Visit: Payer: Self-pay

## 2018-09-02 ENCOUNTER — Telehealth: Payer: Self-pay

## 2018-09-02 ENCOUNTER — Inpatient Hospital Stay: Payer: PRIVATE HEALTH INSURANCE

## 2018-09-02 VITALS — BP 101/80 | HR 116 | Temp 98.0°F | Resp 18 | Ht 70.0 in

## 2018-09-02 DIAGNOSIS — C7951 Secondary malignant neoplasm of bone: Secondary | ICD-10-CM

## 2018-09-02 DIAGNOSIS — C801 Malignant (primary) neoplasm, unspecified: Secondary | ICD-10-CM | POA: Diagnosis not present

## 2018-09-02 DIAGNOSIS — C61 Malignant neoplasm of prostate: Secondary | ICD-10-CM

## 2018-09-02 DIAGNOSIS — Z5111 Encounter for antineoplastic chemotherapy: Secondary | ICD-10-CM | POA: Diagnosis not present

## 2018-09-02 LAB — CMP (CANCER CENTER ONLY)
ALT: 29 U/L (ref 0–44)
AST: 18 U/L (ref 15–41)
Albumin: 2.6 g/dL — ABNORMAL LOW (ref 3.5–5.0)
Alkaline Phosphatase: 318 U/L — ABNORMAL HIGH (ref 38–126)
Anion gap: 12 (ref 5–15)
BUN: 51 mg/dL — ABNORMAL HIGH (ref 6–20)
CO2: 16 mmol/L — ABNORMAL LOW (ref 22–32)
Calcium: 10.1 mg/dL (ref 8.9–10.3)
Chloride: 112 mmol/L — ABNORMAL HIGH (ref 98–111)
Creatinine: 0.99 mg/dL (ref 0.61–1.24)
GFR, Est AFR Am: >60 mL/min (ref >60)
GFR, Estimated: >60 mL/min (ref >60)
Glucose, Bld: 175 mg/dL — ABNORMAL HIGH (ref 70–99)
Potassium: 5.1 mmol/L (ref 3.5–5.1)
Sodium: 140 mmol/L (ref 135–145)
Total Bilirubin: 0.6 mg/dL (ref 0.3–1.2)
Total Protein: 7 g/dL (ref 6.5–8.1)

## 2018-09-02 LAB — CBC WITH DIFFERENTIAL (CANCER CENTER ONLY)
Abs Immature Granulocytes: 1.76 10*3/uL — ABNORMAL HIGH (ref 0.00–0.07)
Basophils Absolute: 0 10*3/uL (ref 0.0–0.1)
Basophils Relative: 0 %
Eosinophils Absolute: 0 10*3/uL (ref 0.0–0.5)
Eosinophils Relative: 0 %
HCT: 35.1 % — ABNORMAL LOW (ref 39.0–52.0)
Hemoglobin: 11.3 g/dL — ABNORMAL LOW (ref 13.0–17.0)
Immature Granulocytes: 21 %
Lymphocytes Relative: 8 %
Lymphs Abs: 0.6 10*3/uL — ABNORMAL LOW (ref 0.7–4.0)
MCH: 30.5 pg (ref 26.0–34.0)
MCHC: 32.2 g/dL (ref 30.0–36.0)
MCV: 94.6 fL (ref 80.0–100.0)
Monocytes Absolute: 0.5 10*3/uL (ref 0.1–1.0)
Monocytes Relative: 6 %
Neutro Abs: 5.4 10*3/uL (ref 1.7–7.7)
Neutrophils Relative %: 65 %
Platelet Count: 405 10*3/uL — ABNORMAL HIGH (ref 150–400)
RBC: 3.71 MIL/uL — ABNORMAL LOW (ref 4.22–5.81)
RDW: 18.6 % — ABNORMAL HIGH (ref 11.5–15.5)
WBC Count: 8.3 10*3/uL (ref 4.0–10.5)
nRBC: 2.3 % — ABNORMAL HIGH (ref 0.0–0.2)

## 2018-09-02 NOTE — Progress Notes (Signed)
Hematology and Oncology Follow Up Visit  Rodney [name] [id number] 27-May-1970 48 y.o. 09/02/2018 9:36 AM Rodney [name], MDBurdine, [name], MD   Principle Diagnosis: 48 year old man with:    1.  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    2.  Prostate cancer diagnosed in July 2019.  He presented with a Gleason score of 6 and a PSA 5.58 and currently disease-free after prostatectomy.  Prior Therapy:  He is status post a robotic assisted laparoscopic radical cystoprostatectomy and bilateral pelvic lymphadenectomy and neobladder creation completed on November 08, 2017 by Dr. Alinda Money.  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  Prostate adenocarcinoma is 3+4 = 7 with 0 out of 14 lymph nodes noted.  He is status post radiation to the cervical and thoracic spine for a total of 30 Gy in 10 fractions completed on August 06, 2018.  Current therapy: Palliative chemotherapy utilizing cisplatin and gemcitabine with cycle 1 started on 08/13/2018.  He is here for evaluation prior to cycle 2.  Interim History: Rodney [name] returns today for a follow-up.  Since the last visit, he completed the first cycle of chemotherapy with few complications.  He was seen in the emergency department on 08/31/2018 for dehydration and failure to thrive and dehydration.  He has tolerated chemotherapy poorly with excessive fatigue, tiredness and nausea and poor p.o. intake.  His pain has been reasonably controlled and has decreased on his breakthrough Dilaudid needs.  He is currently on fentanyl patch and requiring Dilaudid at 2 mg every 6 hours and at times less than that.  His mobility has been limited in his p.o. intake has declined.  He has lost weight and appears to have more failure to thrive.   Patient denied headaches, blurry vision, syncope or seizures.  Does report lethargy and changes in his  mentation at times.  Denies any fevers, chills or sweats.  Denied chest pain, palpitation, orthopnea or leg edema.  Denied cough, wheezing or hemoptysis.  Denied nausea, vomiting or abdominal pain.  Denies any constipation or diarrhea.  Denies any frequency urgency or hesitancy.  Denies any arthralgias or myalgias.  Denies any skin rashes or lesions.  Denies any bleeding or clotting tendency.  Denies any easy bruising.  Denies any hair or nail changes.  Denies any anxiety or depression.  Remaining review of system is negative.              Medications: Reviewed today without any changes. Current Outpatient Medications  Medication Sig Dispense Refill  . acetaminophen (TYLENOL) 325 MG tablet Take 2 tablets (650 mg total) by mouth every 6 (six) hours as needed for mild pain (or Fever >/= 101). 30 tablet 0  . alum & mag hydroxide-simeth (MAALOX/MYLANTA) 200-200-20 MG/5ML suspension Take 15 mLs by mouth every 6 (six) hours as needed for indigestion or heartburn. 355 mL 0  . cyanocobalamin (,VITAMIN B-12,) 1000 MCG/ML injection Inject 1,000 mcg into the muscle every 30 (thirty) days.    . cyclobenzaprine (FLEXERIL) 10 MG tablet Take 10 mg by mouth 3 (three) times daily.    . DULoxetine (CYMBALTA) 30 MG capsule Take 1 capsule (30 mg total) by mouth daily. 30 capsule 0  . fentaNYL (DURAGESIC) 100 MCG/HR Place 1 patch onto the skin every 3 (three) days. 10 patch 0  . HYDROmorphone (DILAUDID) 2 MG tablet Take 1 tablet (2 mg  total) by mouth every 4 (four) hours as needed for severe pain. 90 tablet 0  . lidocaine-prilocaine (EMLA) cream Apply 1 application topically as needed. 30 g 0  . megestrol (MEGACE) 400 MG/10ML suspension Take 10 mLs (400 mg total) by mouth 2 (two) times daily. 240 mL 0  . Multiple Vitamin (MULTIVITAMIN WITH MINERALS) TABS tablet Take 1 tablet by mouth daily.    . naproxen sodium (ALEVE) 220 MG tablet Take 220-440 mg by mouth 2 (two) times daily as needed.    . ondansetron  (ZOFRAN) 4 MG tablet Take 1 tablet (4 mg total) by mouth every 6 (six) hours as needed for nausea. 20 tablet 0  . pantoprazole (PROTONIX) 40 MG tablet Take 1 tablet (40 mg total) by mouth daily. 30 tablet 0  . polyethylene glycol (MIRALAX / GLYCOLAX) 17 g packet Take 17 g by mouth 2 (two) times daily. 14 each 0  . senna-docusate (SENOKOT-S) 8.6-50 MG tablet Take 2 tablets by mouth 2 (two) times daily. 60 tablet 0  . sildenafil (VIAGRA) 100 MG tablet Take 100 mg by mouth daily as needed.    . vitamin B-12 (CYANOCOBALAMIN) 1000 MCG tablet Take 1,000 mcg by mouth 2 (two) times a day.     No current facility-administered medications for this visit.      Allergies:  Allergies  Allergen Reactions  . Morphine And Related Itching  . Vicodin [Hydrocodone-Acetaminophen] Itching    Past Medical History, Surgical history, Social history, and Family History updated without changes.    Physical Exam:  Blood pressure 101/80, pulse (!) 116, temperature 98 F (36.7 C), temperature source Temporal, resp. rate 18, height 5\' 10"  (1.778 m), SpO2 100 %.    ECOG: 2   General appearance: Alert, awake without any distress.  [place] ill-appearing and cachectic. Head: Atraumatic without abnormalities Oropharynx: Without any thrush or ulcers. Eyes: No scleral icterus. Lymph nodes: No lymphadenopathy noted in the cervical, supraclavicular, or axillary nodes Heart:regular rate and rhythm, without any murmurs or gallops.   Lung: Clear to auscultation without any rhonchi, wheezes or dullness to percussion. Abdomin: Soft, nontender without any shifting dullness or ascites. Musculoskeletal: No clubbing or cyanosis. Neurological: No motor or sensory deficits. Skin: No rashes or lesions.            Lab Results: Lab Results  Component Value Date   WBC 9.1 08/31/2018   HGB 12.1 (L) 08/31/2018   HCT 38.1 (L) 08/31/2018   MCV 95.3 08/31/2018   PLT 368 08/31/2018     Chemistry      Component  Value Date/Time   NA 134 (L) 08/31/2018 1835   K 5.3 (H) 08/31/2018 1835   CL 104 08/31/2018 1835   CO2 20 (L) 08/31/2018 1835   BUN 82 (H) 08/31/2018 1835   CREATININE 1.33 (H) 08/31/2018 1835   CREATININE 0.67 08/20/2018 1435      Component Value Date/Time   CALCIUM 9.6 08/31/2018 1835   ALKPHOS 347 (H) 08/31/2018 1835   AST 21 08/31/2018 1835   AST 19 08/20/2018 1435   ALT 38 08/31/2018 1835   ALT 74 (H) 08/20/2018 1435   BILITOT 1.1 08/31/2018 1835   BILITOT 0.4 08/20/2018 1435        Impression and Plan:   48 year old with:   1.  Bladder cancer diagnosed in 2019 and subsequently developed stage IV disease with diffuse metastasis that is biopsy-proven to be high-grade urothelial carcinoma.   He is currently receiving systemic chemotherapy with cisplatin and  gemcitabine and completed the first cycle with poor tolerance.  He has experienced further decline in his performance status no weight loss and excessive fatigue.  The natural course of this disease was discussed today with the patient and his family company to him today.  His disease is incurable and therapy remains palliative with outside chance to improve his quality of life.  He is tolerating cisplatin based chemotherapy poorly I recommended switching to gemcitabine and carboplatin instead.  If this therapy is also poorly tolerated or if he continues to decline despite chemotherapy and I would discontinue chemotherapy and proceed with hospice.  He understands these recommendations today and fully on board with a palliative approach at this time.     2.  IV access:  Port-A-Cath inserted without any complications.   3.  Antiemetics: Antiemetics are available to him.   4.  Renal function surveillance:  His creatinine did increase slightly on cisplatin and switch to carboplatin might help moving forward.   5.  Goals of care:  This was discussed in detail today and he understands his disease is incurable and any  therapy is palliative at best.   6.  Bone pain: Manageable at this time with fentanyl and Dilaudid.  We have discussed strategies to decrease his breakthrough pain medication and potentially decrease his fentanyl to 75.  At this time we will keep it at 100 and monitor moving forward.  7.  Dehydration and failure to thrive: We have discussed strategies to improve his nutritional intake including start Megace and nutritional supplements.  8.  Follow-up: On 09/03/2018 to start cycle 2 of therapy.  Will return in 3 weeks for start of cycle 3.  30  minutes was spent with the patient face-to-face today.  More than 50% of time was dedicated to updating his disease status, treatment options and managing complications related to his therapy and malignancy.    Zola Button, MD 8/17/20209:36 AM

## 2018-09-02 NOTE — Telephone Encounter (Signed)
Spoke to [employer] in pharmacy and made her aware of change from Cisplatin to Carboplatin starting 09/03/18.

## 2018-09-02 NOTE — Telephone Encounter (Signed)
-----   Message from [name], MD sent at 09/02/2018 10:12 AM EDT ----- Please let pharmacy know that I changed his Cisplatin to Carboplatin starting on 8/18.  Thanks.

## 2018-09-02 NOTE — Progress Notes (Signed)
DISCONTINUE ON PATHWAY REGIMEN - Bladder     A cycle is every 21 days:     Gemcitabine      Cisplatin   **Always confirm dose/schedule in your pharmacy ordering system**  REASON: Toxicities / Adverse Event PRIOR TREATMENT: [id number]: Gemcitabine 1,000 mg/m2 D1, 8 + Cisplatin 70 mg/m2 D1 q21 Days for a Maximum of 6 Cycles TREATMENT RESPONSE: Unable to Evaluate  START OFF PATHWAY REGIMEN - Bladder   [id number]:Carboplatin + Gemcitabine (05/998) q21 Days:   A cycle is every 21 days:     Carboplatin      Gemcitabine   **Always confirm dose/schedule in your pharmacy ordering system**  Patient Characteristics: Advanced/Metastatic Disease, First Line, No Prior Platinum-Based Therapy, Good Renal Function (CrCl ? 50 mL/min) Therapeutic Status: Advanced/Metastatic Disease Line of Therapy: First Line Prior Platinum-Based Therapy<= No Renal Function: Good Renal Function (CrCl ? 50 mL/min) Intent of Therapy: Non-Curative / Palliative Intent, Discussed with Patient

## 2018-09-03 ENCOUNTER — Inpatient Hospital Stay: Payer: PRIVATE HEALTH INSURANCE

## 2018-09-03 ENCOUNTER — Other Ambulatory Visit: Payer: Self-pay

## 2018-09-03 ENCOUNTER — Inpatient Hospital Stay: Payer: PRIVATE HEALTH INSURANCE | Admitting: Nutrition

## 2018-09-03 VITALS — BP 105/80 | HR 99 | Temp 98.3°F | Resp 16

## 2018-09-03 DIAGNOSIS — C801 Malignant (primary) neoplasm, unspecified: Secondary | ICD-10-CM

## 2018-09-03 DIAGNOSIS — C7951 Secondary malignant neoplasm of bone: Secondary | ICD-10-CM

## 2018-09-03 DIAGNOSIS — C61 Malignant neoplasm of prostate: Secondary | ICD-10-CM

## 2018-09-03 DIAGNOSIS — C679 Malignant neoplasm of bladder, unspecified: Secondary | ICD-10-CM

## 2018-09-03 DIAGNOSIS — Z95828 Presence of other vascular implants and grafts: Secondary | ICD-10-CM

## 2018-09-03 DIAGNOSIS — Z5111 Encounter for antineoplastic chemotherapy: Secondary | ICD-10-CM | POA: Diagnosis not present

## 2018-09-03 LAB — CBC WITH DIFFERENTIAL (CANCER CENTER ONLY)
Abs Immature Granulocytes: 1.97 10*3/uL — ABNORMAL HIGH (ref 0.00–0.07)
Basophils Absolute: 0 10*3/uL (ref 0.0–0.1)
Basophils Relative: 0 %
Eosinophils Absolute: 0 10*3/uL (ref 0.0–0.5)
Eosinophils Relative: 0 %
HCT: 34.3 % — ABNORMAL LOW (ref 39.0–52.0)
Hemoglobin: 11.2 g/dL — ABNORMAL LOW (ref 13.0–17.0)
Immature Granulocytes: 20 %
Lymphocytes Relative: 8 %
Lymphs Abs: 0.8 10*3/uL (ref 0.7–4.0)
MCH: 30.3 pg (ref 26.0–34.0)
MCHC: 32.7 g/dL (ref 30.0–36.0)
MCV: 92.7 fL (ref 80.0–100.0)
Monocytes Absolute: 1 10*3/uL (ref 0.1–1.0)
Monocytes Relative: 9 %
Neutro Abs: 6.3 10*3/uL (ref 1.7–7.7)
Neutrophils Relative %: 63 %
Platelet Count: 438 10*3/uL — ABNORMAL HIGH (ref 150–400)
RBC: 3.7 MIL/uL — ABNORMAL LOW (ref 4.22–5.81)
RDW: 18.6 % — ABNORMAL HIGH (ref 11.5–15.5)
WBC Count: 10.1 10*3/uL (ref 4.0–10.5)
nRBC: 1.4 % — ABNORMAL HIGH (ref 0.0–0.2)

## 2018-09-03 LAB — CMP (CANCER CENTER ONLY)
ALT: 26 U/L (ref 0–44)
AST: 18 U/L (ref 15–41)
Albumin: 2.7 g/dL — ABNORMAL LOW (ref 3.5–5.0)
Alkaline Phosphatase: 323 U/L — ABNORMAL HIGH (ref 38–126)
Anion gap: 13 (ref 5–15)
BUN: 49 mg/dL — ABNORMAL HIGH (ref 6–20)
CO2: 16 mmol/L — ABNORMAL LOW (ref 22–32)
Calcium: 10.3 mg/dL (ref 8.9–10.3)
Chloride: 114 mmol/L — ABNORMAL HIGH (ref 98–111)
Creatinine: 0.84 mg/dL (ref 0.61–1.24)
GFR, Est AFR Am: >60 mL/min (ref >60)
GFR, Estimated: >60 mL/min (ref >60)
Glucose, Bld: 144 mg/dL — ABNORMAL HIGH (ref 70–99)
Potassium: 4.3 mmol/L (ref 3.5–5.1)
Sodium: 143 mmol/L (ref 135–145)
Total Bilirubin: 0.7 mg/dL (ref 0.3–1.2)
Total Protein: 7.1 g/dL (ref 6.5–8.1)

## 2018-09-03 LAB — URINE CULTURE: Culture: >=100000 — AB

## 2018-09-03 MED ORDER — SODIUM CHLORIDE 0.9% FLUSH
10.0000 mL | Freq: Once | INTRAVENOUS | Status: AC
  Administered 2018-09-03: 09:00:00 10 mL
  Filled 2018-09-03: qty 10

## 2018-09-03 MED ORDER — HEPARIN SOD (PORK) LOCK FLUSH 100 UNIT/ML IV SOLN
500.0000 [IU] | Freq: Once | INTRAVENOUS | Status: AC | PRN
  Administered 2018-09-03: 500 [IU]
  Filled 2018-09-03: qty 5

## 2018-09-03 MED ORDER — PALONOSETRON HCL INJECTION 0.25 MG/5ML
INTRAVENOUS | Status: AC
  Filled 2018-09-03: qty 5

## 2018-09-03 MED ORDER — PALONOSETRON HCL INJECTION 0.25 MG/5ML
0.2500 mg | Freq: Once | INTRAVENOUS | Status: AC
  Administered 2018-09-03: 0.25 mg via INTRAVENOUS

## 2018-09-03 MED ORDER — SODIUM CHLORIDE 0.9 % IV SOLN
1800.0000 mg | Freq: Once | INTRAVENOUS | Status: AC
  Administered 2018-09-03: 1800 mg via INTRAVENOUS
  Filled 2018-09-03: qty 47.34

## 2018-09-03 MED ORDER — SODIUM CHLORIDE 0.9% FLUSH
10.0000 mL | INTRAVENOUS | Status: DC | PRN
  Administered 2018-09-03: 10 mL
  Filled 2018-09-03: qty 10

## 2018-09-03 MED ORDER — SODIUM CHLORIDE 0.9 % IV SOLN
600.0000 mg | Freq: Once | INTRAVENOUS | Status: AC
  Administered 2018-09-03: 600 mg via INTRAVENOUS
  Filled 2018-09-03: qty 60

## 2018-09-03 MED ORDER — SODIUM CHLORIDE 0.9 % IV SOLN
Freq: Once | INTRAVENOUS | Status: AC
  Administered 2018-09-03: 10:00:00 via INTRAVENOUS
  Filled 2018-09-03: qty 250

## 2018-09-03 MED ORDER — SODIUM CHLORIDE 0.9 % IV SOLN
Freq: Once | INTRAVENOUS | Status: AC
  Administered 2018-09-03: 11:00:00 via INTRAVENOUS
  Filled 2018-09-03: qty 5

## 2018-09-03 NOTE — Progress Notes (Signed)
Nutrition follow-up completed with patient during infusion for stage IV bladder cancer/prostate cancer. Patient reports he does not feel well. Current weight documented as 145 pounds on August 15 down from 170 pounds July 22. MD has changed patient's chemotherapy to gemcitabine and carboplatin hoping for better tolerance. Patient reports his pain is better but certainly not resolved. He reports he continues to have some nausea and vomiting which is controlled if he takes his medication. Patient reports he has been drinking to [employer] daily.  Nutrition diagnosis: Unintended weight loss continues.  Intervention: Educated patient to try to increase [employer] breakfast essentials 3 times daily between meals. Encouraged him to eat small amounts of food to improve nausea.  Suggested bland foods.  Monitoring, evaluation, goals: Patient will work to increase overall calories and protein to minimize further weight loss.  Next visit: To be scheduled as needed.  **Disclaimer: This note was dictated with voice recognition software. Similar sounding words can inadvertently be transcribed and this note may contain transcription errors which may not have been corrected upon publication of note.**

## 2018-09-03 NOTE — Patient Instructions (Signed)

## 2018-09-03 NOTE — Patient Instructions (Signed)
[facility] Discharge Instructions for Patients Receiving Chemotherapy  Today you received the following chemotherapy agents: Gemzar and Carboplatin.  To help prevent nausea and vomiting after your treatment, we encourage you to take your nausea medication as directed.   If you develop nausea and vomiting that is not controlled by your nausea medication, call the clinic.   BELOW ARE SYMPTOMS THAT SHOULD BE REPORTED IMMEDIATELY:  *FEVER GREATER THAN 100.5 F  *CHILLS WITH OR WITHOUT FEVER  NAUSEA AND VOMITING THAT IS NOT CONTROLLED WITH YOUR NAUSEA MEDICATION  *UNUSUAL SHORTNESS OF BREATH  *UNUSUAL BRUISING OR BLEEDING  TENDERNESS IN MOUTH AND THROAT WITH OR WITHOUT PRESENCE OF ULCERS  *URINARY PROBLEMS  *BOWEL PROBLEMS  UNUSUAL RASH Items with * indicate a potential emergency and should be followed up as soon as possible.  Feel free to call the clinic should you have any questions or concerns. The clinic phone number is [phone].  Please show the [place] at check-in to the Emergency Department and triage nurse.  Carboplatin injection What is this medicine? CARBOPLATIN (KAR boe pla tin) is a chemotherapy drug. It targets fast dividing cells, like cancer cells, and causes these cells to die. This medicine is used to treat ovarian cancer and many other cancers. This medicine may be used for other purposes; ask your health care provider or pharmacist if you have questions. COMMON BRAND NAME(S): Paraplatin What should I tell my health care provider before I take this medicine? They need to know if you have any of these conditions:  blood disorders  hearing problems  kidney disease  recent or ongoing radiation therapy  an unusual or allergic reaction to carboplatin, cisplatin, other chemotherapy, other medicines, foods, dyes, or preservatives  pregnant or trying to get pregnant  breast-feeding How should I use this medicine? This drug is  usually given as an infusion into a vein. It is administered in a hospital or clinic by a specially trained health care professional. Talk to your pediatrician regarding the use of this medicine in children. Special care may be needed. Overdosage: If you think you have taken too much of this medicine contact a poison control center or emergency room at once. NOTE: This medicine is only for you. Do not share this medicine with others. What if I miss a dose? It is important not to miss a dose. Call your doctor or health care professional if you are unable to keep an appointment. What may interact with this medicine?  medicines for seizures  medicines to increase blood counts like filgrastim, pegfilgrastim, sargramostim  some antibiotics like amikacin, gentamicin, neomycin, streptomycin, tobramycin  vaccines Talk to your doctor or health care professional before taking any of these medicines:  acetaminophen  aspirin  ibuprofen  ketoprofen  naproxen This list may not describe all possible interactions. Give your health care provider a list of all the medicines, herbs, non-prescription drugs, or dietary supplements you use. Also tell them if you smoke, drink alcohol, or use illegal drugs. Some items may interact with your medicine. What should I watch for while using this medicine? Your condition will be monitored carefully while you are receiving this medicine. You will need important blood work done while you are taking this medicine. This drug may make you feel generally unwell. This is not uncommon, as chemotherapy can affect healthy cells as well as cancer cells. Report any side effects. Continue your course of treatment even though you feel ill unless your doctor tells you  to stop. In some cases, you may be given additional medicines to help with side effects. Follow all directions for their use. Call your doctor or health care professional for advice if you get a fever, chills or  sore throat, or other symptoms of a cold or flu. Do not treat yourself. This drug decreases your body's ability to fight infections. Try to avoid being around people who are sick. This medicine may increase your risk to bruise or bleed. Call your doctor or health care professional if you notice any unusual bleeding. Be careful brushing and flossing your teeth or using a toothpick because you may get an infection or bleed more easily. If you have any dental work done, tell your dentist you are receiving this medicine. Avoid taking products that contain aspirin, acetaminophen, ibuprofen, naproxen, or ketoprofen unless instructed by your doctor. These medicines may hide a fever. Do not become pregnant while taking this medicine. Women should inform their doctor if they wish to become pregnant or think they might be pregnant. There is a potential for serious side effects to an unborn child. Talk to your health care professional or pharmacist for more information. Do not breast-feed an infant while taking this medicine. What side effects may I notice from receiving this medicine? Side effects that you should report to your doctor or health care professional as soon as possible:  allergic reactions like skin rash, itching or hives, swelling of the face, lips, or tongue  signs of infection - fever or chills, cough, sore throat, pain or difficulty passing urine  signs of decreased platelets or bleeding - bruising, pinpoint red spots on the skin, black, tarry stools, nosebleeds  signs of decreased red blood cells - unusually weak or tired, fainting spells, lightheadedness  breathing problems  changes in hearing  changes in vision  chest pain  high blood pressure  low blood counts - This drug may decrease the number of white blood cells, red blood cells and platelets. You may be at increased risk for infections and bleeding.  nausea and vomiting  pain, swelling, redness or irritation at the  injection site  pain, tingling, numbness in the hands or feet  problems with balance, talking, walking  trouble passing urine or change in the amount of urine Side effects that usually do not require medical attention (report to your doctor or health care professional if they continue or are bothersome):  hair loss  loss of appetite  metallic taste in the mouth or changes in taste This list may not describe all possible side effects. Call your doctor for medical advice about side effects. You may report side effects to FDA at 1-800-FDA-1088. Where should I keep my medicine? This drug is given in a hospital or clinic and will not be stored at home. NOTE: This sheet is a summary. It may not cover all possible information. If you have questions about this medicine, talk to your doctor, pharmacist, or health care provider.  2020 Elsevier/Gold Standard (2007-04-09 14:38:05)

## 2018-09-04 ENCOUNTER — Telehealth: Payer: Self-pay | Admitting: Emergency Medicine

## 2018-09-04 ENCOUNTER — Telehealth: Payer: Self-pay

## 2018-09-04 ENCOUNTER — Other Ambulatory Visit: Payer: Self-pay

## 2018-09-04 MED ORDER — DEXAMETHASONE 4 MG PO TABS: 4.0000 mg | ORAL_TABLET | Freq: Every day | ORAL | 0 refills | Status: DC

## 2018-09-04 NOTE — Telephone Encounter (Signed)
Post ED Visit - Positive Culture Follow-up  Culture report reviewed by antimicrobial stewardship pharmacist: [facility] Team []  Elenor Quinones, Pharm.D. []  Heide Guile, Pharm.D., BCPS AQ-ID []  Parks Neptune, Pharm.D., BCPS []  Alycia Rossetti, Pharm.D., BCPS []  [place], Pharm.D., BCPS, AAHIVP []  Legrand Como, Pharm.D., BCPS, AAHIVP []  Salome Arnt, PharmD, BCPS []  Johnnette Gourd, PharmD, BCPS []  Hughes Better, PharmD, BCPS []  Leeroy Cha, PharmD []  Laqueta Linden, PharmD, BCPS []  Albertina Parr, PharmD Nicoletta Dress PharmD  [place] Team []  Leodis Sias, PharmD []  Lindell Spar, PharmD []  Royetta Asal, PharmD []  Graylin Shiver, Rph []  Rema Fendt) Glennon Mac, PharmD []  Arlyn Dunning, PharmD []  Netta Cedars, PharmD []  Dia Sitter, PharmD []  Leone Haven, PharmD []  Gretta Arab, PharmD []  Theodis Shove, PharmD []  Peggyann Juba, PharmD []  Reuel Boom, PharmD   Positive urine culture Treated with none, asymptomatic,and no further patient follow-up is required at this time.  Hazle Nordmann 09/04/2018, 11:19 AM

## 2018-09-04 NOTE — Telephone Encounter (Signed)
-----   Message from [name], MD sent at 09/04/2018  3:26 PM EDT ----- Please send a refill with the following instructions: Decadron 4 mg once a day for a week.  He is to take half a tablet a day for a week and then stop.  Thanks ----- Message ----- From: [name], RN Sent: 09/04/2018   3:20 PM EDT To: [name], MD  Patient's fiance' called and is requesting a refill on Decadron 4 mg bid.  It looks like this was prescribed for the patient when he left the hospital. Do you want him to continue this? Lanelle Bal

## 2018-09-06 ENCOUNTER — Telehealth: Payer: Self-pay

## 2018-09-06 ENCOUNTER — Other Ambulatory Visit: Payer: Self-pay

## 2018-09-06 ENCOUNTER — Other Ambulatory Visit: Payer: Self-pay | Admitting: Oncology

## 2018-09-06 ENCOUNTER — Inpatient Hospital Stay (HOSPITAL_COMMUNITY)
Admission: EM | Admit: 2018-09-06 | Discharge: 2018-09-13 | DRG: 871 | Disposition: A | Discharge status: Home Health | Payer: PRIVATE HEALTH INSURANCE | Attending: Family Medicine | Admitting: Family Medicine

## 2018-09-06 ENCOUNTER — Encounter (HOSPITAL_COMMUNITY): Payer: Self-pay

## 2018-09-06 ENCOUNTER — Emergency Department (HOSPITAL_COMMUNITY): Payer: PRIVATE HEALTH INSURANCE

## 2018-09-06 DIAGNOSIS — B3789 Other sites of candidiasis: Secondary | ICD-10-CM | POA: Diagnosis present

## 2018-09-06 DIAGNOSIS — Z800 Family history of malignant neoplasm of digestive organs: Secondary | ICD-10-CM

## 2018-09-06 DIAGNOSIS — C7951 Secondary malignant neoplasm of bone: Secondary | ICD-10-CM | POA: Diagnosis present

## 2018-09-06 DIAGNOSIS — N390 Urinary tract infection, site not specified: Secondary | ICD-10-CM

## 2018-09-06 DIAGNOSIS — Z87891 Personal history of nicotine dependence: Secondary | ICD-10-CM

## 2018-09-06 DIAGNOSIS — T451X5A Adverse effect of antineoplastic and immunosuppressive drugs, initial encounter: Secondary | ICD-10-CM | POA: Diagnosis present

## 2018-09-06 DIAGNOSIS — E860 Dehydration: Secondary | ICD-10-CM

## 2018-09-06 DIAGNOSIS — C787 Secondary malignant neoplasm of liver and intrahepatic bile duct: Secondary | ICD-10-CM | POA: Diagnosis present

## 2018-09-06 DIAGNOSIS — R627 Adult failure to thrive: Secondary | ICD-10-CM

## 2018-09-06 DIAGNOSIS — C679 Malignant neoplasm of bladder, unspecified: Secondary | ICD-10-CM | POA: Diagnosis present

## 2018-09-06 DIAGNOSIS — D6481 Anemia due to antineoplastic chemotherapy: Secondary | ICD-10-CM | POA: Diagnosis present

## 2018-09-06 DIAGNOSIS — R1310 Dysphagia, unspecified: Secondary | ICD-10-CM

## 2018-09-06 DIAGNOSIS — Y92009 Unspecified place in unspecified non-institutional (private) residence as the place of occurrence of the external cause: Secondary | ICD-10-CM

## 2018-09-06 DIAGNOSIS — Z79899 Other long term (current) drug therapy: Secondary | ICD-10-CM

## 2018-09-06 DIAGNOSIS — J150 Pneumonia due to Klebsiella pneumoniae: Secondary | ICD-10-CM | POA: Diagnosis present

## 2018-09-06 DIAGNOSIS — E872 Acidosis, unspecified: Secondary | ICD-10-CM | POA: Diagnosis present

## 2018-09-06 DIAGNOSIS — R7881 Bacteremia: Secondary | ICD-10-CM | POA: Diagnosis present

## 2018-09-06 DIAGNOSIS — E861 Hypovolemia: Secondary | ICD-10-CM | POA: Diagnosis present

## 2018-09-06 DIAGNOSIS — D61818 Other pancytopenia: Secondary | ICD-10-CM | POA: Diagnosis present

## 2018-09-06 DIAGNOSIS — Z885 Allergy status to narcotic agent status: Secondary | ICD-10-CM

## 2018-09-06 DIAGNOSIS — B370 Candidal stomatitis: Secondary | ICD-10-CM | POA: Diagnosis present

## 2018-09-06 DIAGNOSIS — A4151 Sepsis due to Escherichia coli [E. coli]: Principal | ICD-10-CM | POA: Diagnosis present

## 2018-09-06 DIAGNOSIS — E440 Moderate protein-calorie malnutrition: Secondary | ICD-10-CM | POA: Diagnosis present

## 2018-09-06 DIAGNOSIS — Z8042 Family history of malignant neoplasm of prostate: Secondary | ICD-10-CM

## 2018-09-06 DIAGNOSIS — C7800 Secondary malignant neoplasm of unspecified lung: Secondary | ICD-10-CM | POA: Diagnosis present

## 2018-09-06 DIAGNOSIS — Z515 Encounter for palliative care: Secondary | ICD-10-CM | POA: Diagnosis present

## 2018-09-06 DIAGNOSIS — C801 Malignant (primary) neoplasm, unspecified: Secondary | ICD-10-CM | POA: Diagnosis present

## 2018-09-06 DIAGNOSIS — K5903 Drug induced constipation: Secondary | ICD-10-CM | POA: Diagnosis present

## 2018-09-06 DIAGNOSIS — T402X5A Adverse effect of other opioids, initial encounter: Secondary | ICD-10-CM | POA: Diagnosis present

## 2018-09-06 DIAGNOSIS — Z20828 Contact with and (suspected) exposure to other viral communicable diseases: Secondary | ICD-10-CM | POA: Diagnosis present

## 2018-09-06 DIAGNOSIS — D701 Agranulocytosis secondary to cancer chemotherapy: Secondary | ICD-10-CM | POA: Diagnosis present

## 2018-09-06 DIAGNOSIS — N179 Acute kidney failure, unspecified: Secondary | ICD-10-CM

## 2018-09-06 DIAGNOSIS — T83511A Infection and inflammatory reaction due to indwelling urethral catheter, initial encounter: Secondary | ICD-10-CM

## 2018-09-06 DIAGNOSIS — R64 Cachexia: Secondary | ICD-10-CM | POA: Diagnosis present

## 2018-09-06 DIAGNOSIS — Z808 Family history of malignant neoplasm of other organs or systems: Secondary | ICD-10-CM

## 2018-09-06 DIAGNOSIS — G9341 Metabolic encephalopathy: Secondary | ICD-10-CM | POA: Diagnosis present

## 2018-09-06 DIAGNOSIS — E876 Hypokalemia: Secondary | ICD-10-CM | POA: Diagnosis present

## 2018-09-06 DIAGNOSIS — E870 Hyperosmolality and hypernatremia: Secondary | ICD-10-CM | POA: Diagnosis present

## 2018-09-06 DIAGNOSIS — C61 Malignant neoplasm of prostate: Secondary | ICD-10-CM

## 2018-09-06 DIAGNOSIS — F319 Bipolar disorder, unspecified: Secondary | ICD-10-CM | POA: Diagnosis present

## 2018-09-06 DIAGNOSIS — C689 Malignant neoplasm of urinary organ, unspecified: Secondary | ICD-10-CM | POA: Diagnosis present

## 2018-09-06 DIAGNOSIS — K219 Gastro-esophageal reflux disease without esophagitis: Secondary | ICD-10-CM | POA: Diagnosis present

## 2018-09-06 DIAGNOSIS — R4182 Altered mental status, unspecified: Secondary | ICD-10-CM | POA: Diagnosis present

## 2018-09-06 MED ORDER — DRONABINOL 2.5 MG PO CAPS: 2.5000 mg | ORAL_CAPSULE | Freq: Two times a day (BID) | ORAL | 0 refills | Status: DC

## 2018-09-06 MED ORDER — PIPERACILLIN-TAZOBACTAM 3.375 G IVPB 30 MIN
3.3750 g | Freq: Once | INTRAVENOUS | Status: AC
  Administered 2018-09-07: 3.375 g via INTRAVENOUS
  Filled 2018-09-06: qty 50

## 2018-09-06 MED ORDER — SODIUM CHLORIDE 0.9 % IV BOLUS
30.0000 mL/kg | Freq: Once | INTRAVENOUS | Status: AC
  Administered 2018-09-06: 23:00:00 1974 mL via INTRAVENOUS

## 2018-09-06 NOTE — Telephone Encounter (Signed)
Pt's fiancee called requesting a prescription for marinol to help stimulate patient's appetite. Rodney [name] states he will not eat or drink anything but will take his medications. Dr. [name] to send prescription in to pharmacy.

## 2018-09-06 NOTE — Telephone Encounter (Signed)
Called Patient's fiancee and let her know, per Edwinna Areola, RN that patient does not qualify for their services at this time due to the fact that patient completed PT/OT 2 weeks prior. Patient's fiancee verbalized understanding, will let us know if the family would like to pursue another agency.

## 2018-09-06 NOTE — ED Provider Notes (Signed)
[facility] DEPT Provider Note: Georgena Spurling, MD, FACEP  CSN: [id number] MRN: [id number] ARRIVAL: 09/06/18 at 2137 ROOM: 1518/1518-01   CHIEF COMPLAINT  Dehydration and Altered Mental Status  Level 5 caveat: Altered mental status HISTORY OF PRESENT ILLNESS  09/06/18 11:16 PM Rodney [name] is a 48 y.o. male who is being treated for metastatic prostate cancer with Gemzar and carboplatin.  He has had a gradual decline in his health over the past week.  He has become increasingly weak, unable to perform activities of daily living and has had little to eat or drink.  She attributes this partly to sores in his mouth and esophagus related to his chemotherapy.  Symptoms have become severe.  He has not had a fever.  He was given 500 mL of IV fluids by EMS prior to arrival.  He is currently getting additional IV fluids through his Port-A-Cath.  His blood pressure on arrival was noted to be 89/72 with a heart rate of 116.  The sepsis protocol was initiated.   Past Medical History:  Diagnosis Date  . Anxiety   . Bladder tumor   . Blood in urine    saw some 3 days ago but none inthe last 2 days   . Bradycardia   . Depression   . Dizziness    historical   . Elevated PSA   . Family history of gastric cancer   . Heartburn    occasional   . Pain with urination   . Prostate cancer ([place])   . 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 BACK SURGERY  2012   3 disc; dr Carloyn Manner  did first and dr elsner did the last 2   . CYSTOSCOPY W/ RETROGRADES N/A 07/02/2017   Procedure: CYSTOSCOPY WITH RETROGRADE PYELOGRAM/EXAM UNDER ANESTHESIA;  Surgeon: Raynelle Bring, MD;  Location: [facility] ORS;  Service: Urology;  Laterality: N/A;  . CYSTOSCOPY WITH BIOPSY N/A 08/09/2017   Procedure: CYSTOSCOPY WITH PROSTATE NEEDLE BIOPSY;  Surgeon: Raynelle Bring, MD;  Location: [facility];  Service: Urology;  Laterality: N/A;  GENERAL ANESTHESIA WITH PARALYSIS/ ONLY NEEDS 60 MIN FOR ALL  PROCEDURES  . IR IMAGING GUIDED PORT INSERTION  08/07/2018  . LEG SURGERY  2012   4 metal plates in plates   . TRANSRECTAL ULTRASOUND N/A 08/09/2017   Procedure: TRANSRECTAL ULTRASOUND;  Surgeon: Raynelle Bring, MD;  Location: [facility];  Service: Urology;  Laterality: N/A;  ONLY NEEDS 60 MIN FOR ALL PROCEDURES  . TRANSURETHRAL RESECTION OF BLADDER TUMOR N/A 07/02/2017   Procedure: TRANSURETHRAL RESECTION OF BLADDER TUMOR (TURBT);  Surgeon: Raynelle Bring, MD;  Location: [facility];  Service: Urology;  Laterality: N/A;  . TRANSURETHRAL RESECTION OF BLADDER TUMOR N/A 08/09/2017   Procedure: TRANSURETHRAL RESECTION OF BLADDER TUMOR (TURBT);  Surgeon: Raynelle Bring, MD;  Location: [facility];  Service: Urology;  Laterality: N/A;    Family History  Problem Relation Age of Onset  . Heart attack Father   . Hypertension Father   . Diabetes Father   . Heart failure Father   . Hypertension Mother   . Gastric cancer Sister 17       d. 33  . Prostate cancer Brother 68  . Melanoma Maternal Aunt   . Stroke Maternal Uncle   . Stroke Paternal Uncle   . Melanoma Maternal Grandmother        dx under 73  . Colon cancer Maternal Grandmother   . Heart  disease Maternal Grandfather   . Heart attack Paternal Grandfather     Social History   Tobacco Use  . Smoking status: Former Smoker    Packs/day: 2.00    Years: 8.00    Pack years: 16.00    Types: Cigarettes    Quit date: 05/29/2016    Years since quitting: 2.2  . Smokeless tobacco: Former Systems developer  . Tobacco comment: quti 2009  Substance Use Topics  . Alcohol use: Not Currently  . Drug use: Not Currently    Types: Marijuana    Comment: last use was 05-29-2017    Prior to Admission medications   Medication Sig Start Date End Date Taking? Authorizing Provider  cyanocobalamin (,VITAMIN B-12,) 1000 MCG/ML injection Inject 1,000 mcg into the muscle every 30 (thirty) days.   Yes [provider]  dexamethasone (DECADRON) 4 MG tablet Take 1 tablet (4  mg total) by mouth daily. Decadron 4 mg once a day for a week then half a tablet once a day for a week and then stop. 09/04/18  Yes [name], MD  dronabinol (MARINOL) 2.5 MG capsule Take 1 capsule (2.5 mg total) by mouth 2 (two) times daily before a meal. 09/06/18  Yes Shadad, Mathis Dad, MD  DULoxetine (CYMBALTA) 30 MG capsule Take 1 capsule (30 mg total) by mouth daily. 08/29/18  Yes [name], MD  fentaNYL (DURAGESIC) 100 MCG/HR Place 1 patch onto the skin every 3 (three) days. 08/05/18  Yes [name], MD  HYDROmorphone (DILAUDID) 2 MG tablet Take 1 tablet (2 mg total) by mouth every 4 (four) hours as needed for severe pain. 08/28/18  Yes [name], MD  lidocaine-prilocaine (EMLA) cream Apply 1 application topically as needed. 08/05/18  Yes [name], MD  megestrol (MEGACE) 400 MG/10ML suspension Take 10 mLs (400 mg total) by mouth 2 (two) times daily. 08/29/18  Yes [name], MD  Multiple Vitamin (MULTIVITAMIN WITH MINERALS) TABS tablet Take 1 tablet by mouth daily.   Yes [provider]  ondansetron (ZOFRAN) 4 MG tablet Take 1 tablet (4 mg total) by mouth every 6 (six) hours as needed for nausea. 08/29/18  Yes [name], MD  pantoprazole (PROTONIX) 40 MG tablet Take 1 tablet (40 mg total) by mouth daily. 08/29/18  Yes [name], MD  polyethylene glycol (MIRALAX / GLYCOLAX) 17 g packet Take 17 g by mouth 2 (two) times daily. 08/02/18  Yes Sheikh, Omair Latif, DO  senna-docusate (SENOKOT-S) 8.6-50 MG tablet Take 2 tablets by mouth 2 (two) times daily. 08/02/18  Yes Sheikh, Omair Latif, DO  acetaminophen (TYLENOL) 325 MG tablet Take 2 tablets (650 mg total) by mouth every 6 (six) hours as needed for mild pain (or Fever >/= 101). Patient not taking: Reported on 09/06/2018 08/02/18   Raiford Noble Latif, DO  alum & mag hydroxide-simeth (MAALOX/MYLANTA) 200-200-20 MG/5ML suspension Take 15 mLs by mouth every 6 (six) hours as needed for indigestion or heartburn.  Patient not taking: Reported on 09/06/2018 08/02/18   Raiford Noble Latif, DO    Allergies Morphine and related and Vicodin [hydrocodone-acetaminophen]   REVIEW OF SYSTEMS     PHYSICAL EXAMINATION  Initial Vital Signs Blood pressure (!) 89/72, pulse (!) 116, temperature 97.6 F (36.4 C), temperature source Oral, resp. rate 16, SpO2 100 %.  Examination General: Well-developed, cachectic male in no acute distress; appearance consistent with age of record HENT: normocephalic; atraumatic Eyes: pupils equal, round and reactive to light  Neck: supple Heart: regular rate and rhythm; tachycardia Lungs: clear to auscultation bilaterally Abdomen: soft; nondistended; nontender; bowel sounds present GU: Condom catheter with dark, cloudy urine and significant sediment in bag Extremities: No deformity; full range of motion; pulses normal Neurologic: Somnolent but arousable; minimally verbal; noted to move all extremities Skin: Warm and dry Psychiatric: Flat affect   RESULTS  Summary of this visit's results, reviewed by myself:   EKG Interpretation  Date/Time:    Ventricular Rate:    PR Interval:    QRS Duration:   QT Interval:    QTC Calculation:   R Axis:     Text Interpretation:        Laboratory Studies: Results for orders placed or performed during the hospital encounter of 09/06/18 (from the past 24 hour(s))  Comprehensive metabolic panel     Status: Abnormal   Collection Time: 09/06/18 10:01 PM  Result Value Ref Range   Sodium 148 (H) 135 - 145 mmol/L   Potassium 4.9 3.5 - 5.1 mmol/L   Chloride 127 (H) 98 - 111 mmol/L   CO2 13 (L) 22 - 32 mmol/L   Glucose, Bld 153 (H) 70 - 99 mg/dL   BUN 98 (H) 6 - 20 mg/dL   Creatinine, Ser 1.43 (H) 0.61 - 1.24 mg/dL   Calcium 10.0 8.9 - 10.3 mg/dL   Total Protein 7.1 6.5 - 8.1 g/dL   Albumin 2.9 (L) 3.5 - 5.0 g/dL   AST 30 15 - 41 U/L   ALT 39 0 - 44 U/L   Alkaline Phosphatase 224 (H) 38 - 126 U/L   Total Bilirubin 0.9 0.3 -  1.2 mg/dL   GFR calc non Af Amer 57 (L) >60 mL/min   GFR calc Af Amer >60 >60 mL/min   Anion gap 8 5 - 15  Lactic acid, plasma     Status: None   Collection Time: 09/06/18 10:01 PM  Result Value Ref Range   Lactic Acid, Venous 1.3 0.5 - 1.9 mmol/L  CBC with Differential     Status: Abnormal   Collection Time: 09/06/18 10:01 PM  Result Value Ref Range   WBC 6.0 4.0 - 10.5 K/uL   RBC 3.39 (L) 4.22 - 5.81 MIL/uL   Hemoglobin 10.3 (L) 13.0 - 17.0 g/dL   HCT 34.5 (L) 39.0 - 52.0 %   MCV 101.8 (H) 80.0 - 100.0 fL   MCH 30.4 26.0 - 34.0 pg   MCHC 29.9 (L) 30.0 - 36.0 g/dL   RDW 19.7 (H) 11.5 - 15.5 %   Platelets 325 150 - 400 K/uL   nRBC 0.0 0.0 - 0.2 %   Neutrophils Relative % 95 %   Neutro Abs 5.7 1.7 - 7.7 K/uL   Lymphocytes Relative 3 %   Lymphs Abs 0.2 (L) 0.7 - 4.0 K/uL   Monocytes Relative 1 %   Monocytes Absolute 0.1 0.1 - 1.0 K/uL   Eosinophils Relative 0 %   Eosinophils Absolute 0.0 0.0 - 0.5 K/uL   Basophils Relative 0 %   Basophils Absolute 0.0 0.0 - 0.1 K/uL   WBC Morphology DOHLE BODIES    Immature Granulocytes 1 %   Abs Immature Granulocytes 0.06 0.00 - 0.07 K/uL  Protime-INR     Status: None   Collection Time: 09/06/18 10:01 PM  Result Value Ref Range   Prothrombin Time 14.4 11.4 - 15.2 seconds   INR 1.1 0.8 - 1.2  Urinalysis, Routine w reflex microscopic     Status:  Abnormal   Collection Time: 09/07/18 12:00 AM  Result Value Ref Range   Color, Urine YELLOW YELLOW   APPearance TURBID (A) CLEAR   Specific Gravity, Urine 1.010 1.005 - 1.030   pH 9.0 (H) 5.0 - 8.0   Glucose, UA NEGATIVE NEGATIVE mg/dL   Hgb urine dipstick SMALL (A) NEGATIVE   Bilirubin Urine NEGATIVE NEGATIVE   Ketones, ur NEGATIVE NEGATIVE mg/dL   Protein, ur >=300 (A) NEGATIVE mg/dL   Nitrite NEGATIVE NEGATIVE   Leukocytes,Ua MODERATE (A) NEGATIVE   RBC / HPF 6-10 0 - 5 RBC/hpf   WBC, UA 21-50 0 - 5 WBC/hpf   Bacteria, UA MANY (A) NONE SEEN   Mucus PRESENT    Budding Yeast PRESENT     Triple Phosphate Crystal PRESENT   Lactic acid, plasma     Status: None   Collection Time: 09/07/18 12:08 AM  Result Value Ref Range   Lactic Acid, Venous 0.9 0.5 - 1.9 mmol/L  SARS Coronavirus 2 [facility] order, Performed in [facility] hospital lab) Nasopharyngeal Nasopharyngeal Swab     Status: None   Collection Time: 09/07/18  3:48 AM   Specimen: Nasopharyngeal Swab  Result Value Ref Range   SARS Coronavirus 2 NEGATIVE NEGATIVE   Imaging Studies: Dg Chest 2 View  Result Date: 09/06/2018 CLINICAL DATA:  Suspected sepsis. EXAM: CHEST - 2 VIEW COMPARISON:  Radiographs and CT 08/02/2018 FINDINGS: Low lung volumes. Right chest port remains in place with tip in the SVC. Normal heart size. Unchanged mediastinal contours. Streaky atelectasis in both lung bases. No pulmonary edema, large pleural effusion or pneumothorax. Numerous osseous lesions including the ribs, spine, and sternum, better appreciated on prior CT. IMPRESSION: Low lung volumes with bibasilar atelectasis. Osseous metastatic disease as characterized on prior CT. Electronically Signed   By: Keith Rake M.D.   On: 09/06/2018 22:34   Ct Head Wo Contrast  Result Date: 09/07/2018 CLINICAL DATA:  Altered mental status (AMS), unclear cause. History of metastatic bladder/prostate cancer. EXAM: CT HEAD WITHOUT CONTRAST TECHNIQUE: Contiguous axial images were obtained from the base of the skull through the vertex without intravenous contrast. COMPARISON:  Head CT 03/06/2010 FINDINGS: Brain: No evidence of acute infarction, hemorrhage, hydrocephalus, extra-axial collection or mass lesion/mass effect. Generalized atrophy which is age advanced. Periventricular white matter changes most prominent in the bifrontal region, progressed from 2012 CT. Vascular: No hyperdense vessel. Skull: Arachnoid granulations in the left frontal bone, unchanged from remote CT. No fracture or focal lesion. Sinuses/Orbits: Paranasal sinuses and mastoid air cells are  clear. The visualized orbits are unremarkable. Other: None. IMPRESSION: 1. No acute intracranial abnormality. 2. Generalized atrophy, prominent for age. Periventricular white matter changes nonspecific, most typically chronic small vessel ischemia, age advanced and progressed from 2012. Electronically Signed   By: Keith Rake M.D.   On: 09/07/2018 00:53    ED COURSE and MDM  Nursing notes and initial vitals signs, including pulse oximetry, reviewed.  Vitals:   09/07/18 0230 09/07/18 0330 09/07/18 0507 09/07/18 0600  BP: 99/75 90/65 (!) 87/57   Pulse: (!) 116 (!) 109 (!) 108   Resp: 20 15 20    Temp:   98.3 F (36.8 C)   TempSrc:      SpO2: 100% 98% 97%   Height:    5\' 10"  (1.778 m)   2 L normal saline IV fluid bolus initiated.  Orders placed for condom catheter change.  Zosyn ordered for possible urosepsis.  PROCEDURES   CRITICAL CARE Performed by:  Ronald Londo L Arcelia Pals Total critical care time: 30 minutes Critical care time was exclusive of separately billable procedures and treating other patients. Critical care was necessary to treat or prevent imminent or life-threatening deterioration. Critical care was time spent personally by me on the following activities: development of treatment plan with patient and/or surrogate as well as nursing, discussions with consultants, evaluation of patient's response to treatment, examination of patient, obtaining history from patient or surrogate, ordering and performing treatments and interventions, ordering and review of laboratory studies, ordering and review of radiographic studies, pulse oximetry and re-evaluation of patient's condition.   ED DIAGNOSES     ICD-10-CM   1. Failure to thrive in adult  R62.7   2. Prostate cancer metastatic to multiple sites ([place])  C61   3. Urinary tract infection associated with catheterization of urinary tract, unspecified indwelling urinary catheter type, initial encounter ([place])  T83.511A    N39.0   4. AKI  (acute kidney injury) ([place])  N17.9   5. Dehydration  E86.0        Yazir Koerber, MD 09/07/18 0700

## 2018-09-06 NOTE — ED Notes (Signed)
Patient transported to X-ray 

## 2018-09-06 NOTE — ED Triage Notes (Signed)
Pt is being treated for metastasized prostate cancer. Per wife pt is dehydrated and altered. Pt appears cachectic. Cloudy drainage noted from foley. Dr. Jana Hakim spoke with wife and she states is expecting them.

## 2018-09-06 NOTE — Progress Notes (Signed)
  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  Identity was confirmed.  All relevant records and images related to the planned course of therapy were reviewed.   Written consent to proceed with treatment was confirmed which was freely given after reviewing the details related to the planned course of therapy had been reviewed with the patient.  Then, the patient was set-up in a stable reproducible  supine position for radiation therapy.  CT images were obtained.  Surface markings were placed.    Medically necessary complex treatment device(s) for immobilization:  Vac-lock bag.   The CT images were loaded into the planning software.  Then the target and avoidance structures were contoured.  Treatment planning then occurred.  The radiation prescription was entered and confirmed.  A total of 2 complex treatment devices were fabricated which relate to the designed radiation treatment fields. Each of these customized fields/ complex treatment devices will be used on a daily basis during the radiation course. I have requested : Isodose Plan.   PLAN:  The patient will receive 8 Gy in 1 fractions.  ________________________________   Jodelle Gross, MD, PhD

## 2018-09-06 NOTE — Telephone Encounter (Signed)
Referral called in to [facility] to Edwinna Areola, RN. Patient's fiancee made aware the referral was sent. Order in [place].   Edwinna Areola, RN called stating patient's insurance is out of network. Spoke to patient's fiancee and informed her of the status. Celesta Gentile to call patient's insurance to see what home health agency will be covered by insurance and will call the office back to let us know who to sent the referral to.

## 2018-09-06 NOTE — Progress Notes (Signed)
  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  C3 2.  T-spine T2-10 3.  Left ilium  NARRATIVE:  The patient was brought to the [facility].  Identity was confirmed.  All relevant records and images related to the planned course of therapy were reviewed.   Written consent to proceed with treatment was confirmed which was freely given after reviewing the details related to the planned course of therapy had been reviewed with the patient.  Then, the patient was set-up in a stable reproducible  supine position for radiation therapy.  CT images were obtained.  Surface markings were placed.    Medically necessary complex treatment device(s) for immobilization:   1.  Vac-lock bag 2.  accuform device.   The CT images were loaded into the planning software.  Then the target and avoidance structures were contoured.  Treatment planning then occurred.  The radiation prescription was entered and confirmed.  A total of 7 complex treatment devices were fabricated which relate to the designed radiation treatment fields. Each of these customized fields/ complex treatment devices will be used on a daily basis during the radiation course. I have requested : Isodose Plan.   PLAN:  The patient has already begun XRT to the C-spine and T-spine at an outside hospital. We will continue treatment to these sites, and also will begin treating the left pelvis which is a new area for XRT.  The patient will receive 24 Gy in 8 fractions to the C-spine and T-spine. He will receive 30 Gy in 10 fractions to the left pelvis.  .  ________________________________   Jodelle Gross, MD, PhD

## 2018-09-07 ENCOUNTER — Emergency Department (HOSPITAL_COMMUNITY): Payer: PRIVATE HEALTH INSURANCE

## 2018-09-07 DIAGNOSIS — N179 Acute kidney failure, unspecified: Secondary | ICD-10-CM | POA: Diagnosis present

## 2018-09-07 DIAGNOSIS — R627 Adult failure to thrive: Secondary | ICD-10-CM

## 2018-09-07 DIAGNOSIS — Z7189 Other specified counseling: Secondary | ICD-10-CM

## 2018-09-07 DIAGNOSIS — J150 Pneumonia due to Klebsiella pneumoniae: Secondary | ICD-10-CM | POA: Diagnosis present

## 2018-09-07 DIAGNOSIS — C7951 Secondary malignant neoplasm of bone: Secondary | ICD-10-CM | POA: Diagnosis present

## 2018-09-07 DIAGNOSIS — N390 Urinary tract infection, site not specified: Secondary | ICD-10-CM | POA: Diagnosis present

## 2018-09-07 DIAGNOSIS — B370 Candidal stomatitis: Secondary | ICD-10-CM | POA: Diagnosis present

## 2018-09-07 DIAGNOSIS — E870 Hyperosmolality and hypernatremia: Secondary | ICD-10-CM

## 2018-09-07 DIAGNOSIS — E872 Acidosis, unspecified: Secondary | ICD-10-CM | POA: Diagnosis present

## 2018-09-07 DIAGNOSIS — E876 Hypokalemia: Secondary | ICD-10-CM | POA: Diagnosis present

## 2018-09-07 DIAGNOSIS — C61 Malignant neoplasm of prostate: Secondary | ICD-10-CM | POA: Diagnosis present

## 2018-09-07 DIAGNOSIS — N10 Acute tubulo-interstitial nephritis: Secondary | ICD-10-CM

## 2018-09-07 DIAGNOSIS — R64 Cachexia: Secondary | ICD-10-CM | POA: Diagnosis present

## 2018-09-07 DIAGNOSIS — R4182 Altered mental status, unspecified: Secondary | ICD-10-CM | POA: Diagnosis not present

## 2018-09-07 DIAGNOSIS — A4151 Sepsis due to Escherichia coli [E. coli]: Secondary | ICD-10-CM | POA: Diagnosis present

## 2018-09-07 DIAGNOSIS — Z20828 Contact with and (suspected) exposure to other viral communicable diseases: Secondary | ICD-10-CM | POA: Diagnosis present

## 2018-09-07 DIAGNOSIS — R1310 Dysphagia, unspecified: Secondary | ICD-10-CM

## 2018-09-07 DIAGNOSIS — G9341 Metabolic encephalopathy: Secondary | ICD-10-CM | POA: Diagnosis present

## 2018-09-07 DIAGNOSIS — E861 Hypovolemia: Secondary | ICD-10-CM | POA: Diagnosis present

## 2018-09-07 DIAGNOSIS — C801 Malignant (primary) neoplasm, unspecified: Secondary | ICD-10-CM

## 2018-09-07 DIAGNOSIS — D61818 Other pancytopenia: Secondary | ICD-10-CM | POA: Diagnosis present

## 2018-09-07 DIAGNOSIS — Y92009 Unspecified place in unspecified non-institutional (private) residence as the place of occurrence of the external cause: Secondary | ICD-10-CM | POA: Diagnosis not present

## 2018-09-07 DIAGNOSIS — Z515 Encounter for palliative care: Secondary | ICD-10-CM

## 2018-09-07 DIAGNOSIS — E860 Dehydration: Secondary | ICD-10-CM | POA: Diagnosis present

## 2018-09-07 DIAGNOSIS — B3789 Other sites of candidiasis: Secondary | ICD-10-CM | POA: Diagnosis present

## 2018-09-07 DIAGNOSIS — E440 Moderate protein-calorie malnutrition: Secondary | ICD-10-CM | POA: Diagnosis present

## 2018-09-07 DIAGNOSIS — K5903 Drug induced constipation: Secondary | ICD-10-CM | POA: Diagnosis present

## 2018-09-07 DIAGNOSIS — C787 Secondary malignant neoplasm of liver and intrahepatic bile duct: Secondary | ICD-10-CM | POA: Diagnosis present

## 2018-09-07 DIAGNOSIS — C7800 Secondary malignant neoplasm of unspecified lung: Secondary | ICD-10-CM | POA: Diagnosis present

## 2018-09-07 LAB — CBC WITH DIFFERENTIAL/PLATELET
Abs Immature Granulocytes: 0.06 10*3/uL (ref 0.00–0.07)
Abs Immature Granulocytes: 0.08 10*3/uL — ABNORMAL HIGH (ref 0.00–0.07)
Basophils Absolute: 0 10*3/uL (ref 0.0–0.1)
Basophils Absolute: 0 10*3/uL (ref 0.0–0.1)
Basophils Relative: 0 %
Basophils Relative: 0 %
Eosinophils Absolute: 0 10*3/uL (ref 0.0–0.5)
Eosinophils Absolute: 0 10*3/uL (ref 0.0–0.5)
Eosinophils Relative: 0 %
Eosinophils Relative: 0 %
HCT: 34.5 % — ABNORMAL LOW (ref 39.0–52.0)
HCT: 28.6 % — ABNORMAL LOW (ref 39.0–52.0)
Hemoglobin: 10.3 g/dL — ABNORMAL LOW (ref 13.0–17.0)
Hemoglobin: 8.4 g/dL — ABNORMAL LOW (ref 13.0–17.0)
Immature Granulocytes: 1 %
Immature Granulocytes: 3 %
Lymphocytes Relative: 3 %
Lymphocytes Relative: 4 %
Lymphs Abs: 0.2 10*3/uL — ABNORMAL LOW (ref 0.7–4.0)
Lymphs Abs: 0.1 10*3/uL — ABNORMAL LOW (ref 0.7–4.0)
MCH: 30.4 pg (ref 26.0–34.0)
MCH: 30 pg (ref 26.0–34.0)
MCHC: 29.9 g/dL — ABNORMAL LOW (ref 30.0–36.0)
MCHC: 29.4 g/dL — ABNORMAL LOW (ref 30.0–36.0)
MCV: 101.8 fL — ABNORMAL HIGH (ref 80.0–100.0)
MCV: 102.1 fL — ABNORMAL HIGH (ref 80.0–100.0)
Monocytes Absolute: 0.1 10*3/uL (ref 0.1–1.0)
Monocytes Absolute: 0 10*3/uL — ABNORMAL LOW (ref 0.1–1.0)
Monocytes Relative: 1 %
Monocytes Relative: 1 %
Neutro Abs: 5.7 10*3/uL (ref 1.7–7.7)
Neutro Abs: 2.6 10*3/uL (ref 1.7–7.7)
Neutrophils Relative %: 95 %
Neutrophils Relative %: 92 %
Platelets: 325 10*3/uL (ref 150–400)
Platelets: 256 10*3/uL (ref 150–400)
RBC: 3.39 MIL/uL — ABNORMAL LOW (ref 4.22–5.81)
RBC: 2.8 MIL/uL — ABNORMAL LOW (ref 4.22–5.81)
RDW: 19.7 % — ABNORMAL HIGH (ref 11.5–15.5)
RDW: 19.4 % — ABNORMAL HIGH (ref 11.5–15.5)
WBC: 6 10*3/uL (ref 4.0–10.5)
WBC: 2.8 10*3/uL — ABNORMAL LOW (ref 4.0–10.5)
nRBC: 0 % (ref 0.0–0.2)
nRBC: 0 % (ref 0.0–0.2)

## 2018-09-07 LAB — BLOOD CULTURE ID PANEL (REFLEXED)

## 2018-09-07 LAB — URINALYSIS, ROUTINE W REFLEX MICROSCOPIC
Bilirubin Urine: NEGATIVE
Glucose, UA: NEGATIVE mg/dL
Ketones, ur: NEGATIVE mg/dL
Nitrite: NEGATIVE
Protein, ur: >=300 mg/dL — AB
Specific Gravity, Urine: 1.01 (ref 1.005–1.030)
pH: 9 — ABNORMAL HIGH (ref 5.0–8.0)

## 2018-09-07 LAB — COMPREHENSIVE METABOLIC PANEL
ALT: 39 U/L (ref 0–44)
AST: 30 U/L (ref 15–41)
Albumin: 2.9 g/dL — ABNORMAL LOW (ref 3.5–5.0)
Alkaline Phosphatase: 224 U/L — ABNORMAL HIGH (ref 38–126)
Anion gap: 8 (ref 5–15)
BUN: 98 mg/dL — ABNORMAL HIGH (ref 6–20)
CO2: 13 mmol/L — ABNORMAL LOW (ref 22–32)
Calcium: 10 mg/dL (ref 8.9–10.3)
Chloride: 127 mmol/L — ABNORMAL HIGH (ref 98–111)
Creatinine, Ser: 1.43 mg/dL — ABNORMAL HIGH (ref 0.61–1.24)
GFR calc Af Amer: >60 mL/min (ref >60)
GFR calc non Af Amer: 57 mL/min — ABNORMAL LOW (ref >60)
Glucose, Bld: 153 mg/dL — ABNORMAL HIGH (ref 70–99)
Potassium: 4.9 mmol/L (ref 3.5–5.1)
Sodium: 148 mmol/L — ABNORMAL HIGH (ref 135–145)
Total Bilirubin: 0.9 mg/dL (ref 0.3–1.2)
Total Protein: 7.1 g/dL (ref 6.5–8.1)

## 2018-09-07 LAB — RENAL FUNCTION PANEL
Albumin: 2.3 g/dL — ABNORMAL LOW (ref 3.5–5.0)
BUN: 83 mg/dL — ABNORMAL HIGH (ref 6–20)
CO2: 10 mmol/L — ABNORMAL LOW (ref 22–32)
Calcium: 9.4 mg/dL (ref 8.9–10.3)
Chloride: >130 mmol/L (ref 98–111)
Creatinine, Ser: 1.29 mg/dL — ABNORMAL HIGH (ref 0.61–1.24)
GFR calc Af Amer: >60 mL/min (ref >60)
GFR calc non Af Amer: >60 mL/min (ref >60)
Glucose, Bld: 176 mg/dL — ABNORMAL HIGH (ref 70–99)
Phosphorus: 3.5 mg/dL (ref 2.5–4.6)
Potassium: 3.8 mmol/L (ref 3.5–5.1)
Sodium: 154 mmol/L — ABNORMAL HIGH (ref 135–145)

## 2018-09-07 LAB — LACTIC ACID, PLASMA
Lactic Acid, Venous: 0.9 mmol/L (ref 0.5–1.9)
Lactic Acid, Venous: 1.3 mmol/L (ref 0.5–1.9)

## 2018-09-07 LAB — SARS CORONAVIRUS 2 BY RT PCR (HOSPITAL ORDER, PERFORMED IN ~~LOC~~ HOSPITAL LAB): SARS Coronavirus 2: NEGATIVE

## 2018-09-07 LAB — MAGNESIUM: Magnesium: 2.6 mg/dL — ABNORMAL HIGH (ref 1.7–2.4)

## 2018-09-07 LAB — PROTIME-INR
INR: 1.1 (ref 0.8–1.2)
Prothrombin Time: 14.4 seconds (ref 11.4–15.2)

## 2018-09-07 MED ORDER — HYDROMORPHONE HCL 2 MG PO TABS
2.0000 mg | ORAL_TABLET | ORAL | Status: DC | PRN
  Administered 2018-09-09 (×2): 2 mg via ORAL
  Filled 2018-09-07 (×2): qty 1

## 2018-09-07 MED ORDER — FLUCONAZOLE IN SODIUM CHLORIDE 200-0.9 MG/100ML-% IV SOLN
200.0000 mg | Freq: Once | INTRAVENOUS | Status: AC
  Administered 2018-09-07: 16:00:00 200 mg via INTRAVENOUS
  Filled 2018-09-07: qty 100

## 2018-09-07 MED ORDER — CYANOCOBALAMIN 1000 MCG/ML IJ SOLN
1000.0000 ug | INTRAMUSCULAR | Status: DC
  Administered 2018-09-07: 10:00:00 1000 ug via INTRAMUSCULAR
  Filled 2018-09-07: qty 1

## 2018-09-07 MED ORDER — ENOXAPARIN SODIUM 40 MG/0.4ML ~~LOC~~ SOLN
40.0000 mg | Freq: Every day | SUBCUTANEOUS | Status: DC
  Administered 2018-09-07 – 2018-09-11 (×5): 40 mg via SUBCUTANEOUS
  Filled 2018-09-07 (×5): qty 0.4

## 2018-09-07 MED ORDER — MAGIC MOUTHWASH W/LIDOCAINE
15.0000 mL | Freq: Four times a day (QID) | ORAL | Status: DC
  Administered 2018-09-07 – 2018-09-13 (×21): 15 mL via ORAL
  Filled 2018-09-07 (×26): qty 15

## 2018-09-07 MED ORDER — SODIUM CHLORIDE 0.9 % IV BOLUS
500.0000 mL | Freq: Once | INTRAVENOUS | Status: AC
  Administered 2018-09-07: 17:00:00 500 mL via INTRAVENOUS

## 2018-09-07 MED ORDER — SODIUM CHLORIDE 0.9 % IV SOLN
2.0000 g | Freq: Every day | INTRAVENOUS | Status: DC
  Administered 2018-09-08 – 2018-09-11 (×4): 2 g via INTRAVENOUS
  Filled 2018-09-07 (×4): qty 2

## 2018-09-07 MED ORDER — DEXAMETHASONE 2 MG PO TABS
2.0000 mg | ORAL_TABLET | Freq: Every day | ORAL | Status: DC
  Administered 2018-09-09 – 2018-09-13 (×5): 2 mg via ORAL
  Filled 2018-09-07 (×5): qty 1

## 2018-09-07 MED ORDER — ONDANSETRON HCL 4 MG PO TABS: 4.0000 mg | ORAL_TABLET | Freq: Four times a day (QID) | ORAL | Status: DC | PRN

## 2018-09-07 MED ORDER — DRONABINOL 2.5 MG PO CAPS
2.5000 mg | ORAL_CAPSULE | Freq: Two times a day (BID) | ORAL | Status: DC
  Administered 2018-09-07 – 2018-09-13 (×13): 2.5 mg via ORAL
  Filled 2018-09-07 (×13): qty 1

## 2018-09-07 MED ORDER — SODIUM CHLORIDE 0.9 % IV SOLN
1.0000 g | Freq: Every day | INTRAVENOUS | Status: DC
  Filled 2018-09-07: qty 10

## 2018-09-07 MED ORDER — DEXTROSE-NACL 5-0.9 % IV SOLN: INTRAVENOUS | Status: DC

## 2018-09-07 MED ORDER — SENNOSIDES-DOCUSATE SODIUM 8.6-50 MG PO TABS
2.0000 | ORAL_TABLET | Freq: Two times a day (BID) | ORAL | Status: DC
  Administered 2018-09-07 – 2018-09-13 (×13): 2 via ORAL
  Filled 2018-09-07 (×13): qty 2

## 2018-09-07 MED ORDER — MEGESTROL ACETATE 400 MG/10ML PO SUSP
400.0000 mg | Freq: Two times a day (BID) | ORAL | Status: DC
  Administered 2018-09-07 – 2018-09-13 (×13): 400 mg via ORAL
  Filled 2018-09-07 (×13): qty 10

## 2018-09-07 MED ORDER — SODIUM CHLORIDE 0.9 % IV SOLN: INTRAVENOUS | Status: DC

## 2018-09-07 MED ORDER — SODIUM CHLORIDE 0.9% FLUSH
10.0000 mL | INTRAVENOUS | Status: DC | PRN
  Administered 2018-09-13: 10 mL
  Filled 2018-09-07: qty 40

## 2018-09-07 MED ORDER — LIDOCAINE-PRILOCAINE 2.5-2.5 % EX CREA: 1.0000 "application " | TOPICAL_CREAM | CUTANEOUS | Status: DC | PRN

## 2018-09-07 MED ORDER — DULOXETINE HCL 30 MG PO CPEP
30.0000 mg | ORAL_CAPSULE | Freq: Every day | ORAL | Status: DC
  Administered 2018-09-07 – 2018-09-13 (×7): 30 mg via ORAL
  Filled 2018-09-07 (×7): qty 1

## 2018-09-07 MED ORDER — NYSTATIN 100000 UNIT/ML MT SUSP
5.0000 mL | Freq: Four times a day (QID) | OROMUCOSAL | Status: DC
  Administered 2018-09-07 – 2018-09-13 (×24): 500000 [IU] via ORAL
  Filled 2018-09-07 (×24): qty 5

## 2018-09-07 MED ORDER — FENTANYL 100 MCG/HR TD PT72
1.0000 | MEDICATED_PATCH | TRANSDERMAL | Status: DC
  Administered 2018-09-09 – 2018-09-12 (×2): 1 via TRANSDERMAL
  Filled 2018-09-07 (×2): qty 1

## 2018-09-07 MED ORDER — ADULT MULTIVITAMIN W/MINERALS CH
1.0000 | ORAL_TABLET | Freq: Every day | ORAL | Status: DC
  Administered 2018-09-07 – 2018-09-13 (×7): 1 via ORAL
  Filled 2018-09-07 (×7): qty 1

## 2018-09-07 MED ORDER — DEXAMETHASONE 4 MG PO TABS
4.0000 mg | ORAL_TABLET | Freq: Every day | ORAL | Status: AC
  Administered 2018-09-07 – 2018-09-08 (×2): 4 mg via ORAL
  Filled 2018-09-07 (×2): qty 1

## 2018-09-07 MED ORDER — POLYETHYLENE GLYCOL 3350 17 G PO PACK
17.0000 g | PACK | Freq: Two times a day (BID) | ORAL | Status: DC
  Administered 2018-09-07 – 2018-09-13 (×13): 17 g via ORAL
  Filled 2018-09-07 (×13): qty 1

## 2018-09-07 MED ORDER — PANTOPRAZOLE SODIUM 40 MG PO TBEC
40.0000 mg | DELAYED_RELEASE_TABLET | Freq: Every day | ORAL | Status: DC
  Administered 2018-09-07: 10:00:00 40 mg via ORAL
  Filled 2018-09-07: qty 1

## 2018-09-07 MED ORDER — SODIUM BICARBONATE-DEXTROSE 150-5 MEQ/L-% IV SOLN
150.0000 meq | INTRAVENOUS | Status: DC
  Administered 2018-09-07 – 2018-09-08 (×3): 150 meq via INTRAVENOUS
  Filled 2018-09-07 (×4): qty 1000

## 2018-09-07 MED ORDER — ONDANSETRON HCL 4 MG/2ML IJ SOLN: 4.0000 mg | Freq: Four times a day (QID) | INTRAMUSCULAR | Status: DC | PRN

## 2018-09-07 MED ORDER — FLUCONAZOLE 100MG IVPB
100.0000 mg | INTRAVENOUS | Status: DC
  Administered 2018-09-08 – 2018-09-10 (×3): 100 mg via INTRAVENOUS
  Filled 2018-09-07 (×4): qty 50

## 2018-09-07 NOTE — Progress Notes (Signed)
Dr Grandville Silos notified of the critical value .Chloride >130

## 2018-09-07 NOTE — Progress Notes (Signed)
Patient is very confused at this time. He is disoriented to time and place.  I noticed his admission history was not completed.  I will be unable to complete at this time d/t patient being a poor historian (thinks he is at [facility], and Barbette Or is president).  Will see if day shift can speak with family in am. Roderick Pee

## 2018-09-07 NOTE — Progress Notes (Signed)
MEWS Guidelines - (patients age 50 and over)  Red - At High Risk for Deterioration Yellow - At risk for Deterioration  1. Go to room and assess patient 2. Validate data. Is this patient's baseline? If data confirmed: 3. Is this an acute change? 4. Administer prn meds/treatments as ordered. 5. Note Sepsis score 6. Review goals of care 7. Sports coach, RRT nurse and Provider. 8. Ask Provider to come to bedside.  9. Document patient condition/interventions/response. 10. Increase frequency of vital signs and focused assessments to at least q15 minutes x 4, then q30 minutes x2. - If stable, then q1h x3, then q4h x3 and then q8h or dept. routine. - If unstable, contact Provider & RRT nurse. Prepare for possible transfer. 11. Add entry in progress notes using the smart phrase ".MEWS". 1. Go to room and assess patient 2. Validate data. Is this patient's baseline? If data confirmed: 3. Is this an acute change? 4. Administer prn meds/treatments as ordered? 5. Note Sepsis score 6. Review goals of care 7. Sports coach and Provider 8. Call RRT nurse as needed. 9. Document patient condition/interventions/response. 10. Increase frequency of vital signs and focused assessments to at least q2h x2. - If stable, then q4h x2 and then q8h or dept. routine. - If unstable, contact Provider & RRT nurse. Prepare for possible transfer. 11. Add entry in progress notes using the smart phrase ".MEWS".  Green - Likely stable [place] - Comfort Care Only  1. Continue routine/ordered monitoring.  2. Review goals of care. 1. Continue routine/ordered monitoring. 2. Review goals of care.    Patient remains in the yellow with his MEWS score. Patient has had a sustained elevated pulse.  This is not an acute change. He has had this over the past few days.  Provider aware and spoke with rapid response RN tonight.Rodney [name]

## 2018-09-07 NOTE — H&P (Signed)
History and Physical   Rodney [name] [id number] DOB: [birth year] DOA: 09/06/2018  Referring MD/NP/PA: Dr. Leonides Schanz  PCP: Curlene Labrum, MD   Outpatient Specialists: Dr. [name]  Patient coming from: Home  Chief Complaint: Altered mental status and weakness  HPI: Rodney [name] is a 48 y.o. 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  He has been awake and alert and communicating at the time.  Since then he started declining.  After his chemotherapy on Tuesday he has progressively worsened even more.  Not eating or drinking adequately.  In the last 24 hours he has been unresponsive.  Wife brought patient to the ER where he was evaluated.  He was found to have UTI, severe dehydration and acute kidney injury.  Patient is currently not able to give any history so history is obtained from the wife who is at bedside.  He has had some fever at home.  Patient remains a full code.  Is being admitted with altered mental status as well as significant UTI..  ED Course: Temperature 97.6 blood pressure 89/72, pulse 123 with respirate 22 and oxygen sat 100% on room air.  White count is 6.0 hemoglobin 10.3 platelets 325.  Sodium 148 potassium 4.9 chloride 127 CO2 13 BUN 98 creatinine 1.43 and calcium 10.0.  Urinalysis showed moderate leukocytes.  Very turbid urine.  Many bacteria.  WBC 21-50.  Triple phosphate crystals present.  Head CT without contrast showed no acute findings.  Showed bibasilar atelectasis.  Patient is being admitted for further treatment and initiated on antibiotics.  Review of Systems: As per HPI otherwise 10 point review of systems negative.    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[facility])     Past Surgical History:  Procedure Laterality Date   BACK SURGERY  2012   3 disc; dr Carloyn Manner  did first and dr elsner did the last 2    CYSTOSCOPY W/ RETROGRADES N/A 07/02/2017   Procedure: CYSTOSCOPY WITH RETROGRADE PYELOGRAM/EXAM UNDER ANESTHESIA;  Surgeon: Raynelle Bring, MD;  Location: [facility];  Service: Urology;  Laterality: N/A;   CYSTOSCOPY WITH BIOPSY N/A 08/09/2017   Procedure: CYSTOSCOPY WITH PROSTATE NEEDLE BIOPSY;  Surgeon: Raynelle Bring, MD;  Location: [facility];  Service: Urology;  Laterality: N/A;  GENERAL ANESTHESIA WITH PARALYSIS/ ONLY NEEDS 60 MIN FOR ALL PROCEDURES   IR IMAGING GUIDED PORT INSERTION  08/07/2018   LEG SURGERY  2012   4 metal plates in plates    TRANSRECTAL ULTRASOUND N/A 08/09/2017   Procedure: TRANSRECTAL ULTRASOUND;  Surgeon: Raynelle Bring, MD;  Location: [facility];  Service: Urology;  Laterality: N/A;  ONLY NEEDS 60 MIN FOR ALL PROCEDURES   TRANSURETHRAL RESECTION OF BLADDER TUMOR N/A 07/02/2017   Procedure: TRANSURETHRAL RESECTION OF BLADDER TUMOR (TURBT);  Surgeon: Raynelle Bring, MD;  Location: [facility];  Service: Urology;  Laterality: N/A;  TRANSURETHRAL RESECTION OF BLADDER TUMOR N/A 08/09/2017   Procedure: TRANSURETHRAL RESECTION OF BLADDER TUMOR (TURBT);  Surgeon: Raynelle Bring, MD;  Location: [facility];  Service: Urology;  Laterality: N/A;     reports that he quit smoking about 2 years ago. His smoking use included cigarettes. He has a 16.00 pack-year smoking history. He has quit using smokeless tobacco. He reports previous alcohol use. He reports previous drug use. Drug: Marijuana.  Allergies  Allergen Reactions   Morphine And Related Itching   Vicodin [Hydrocodone-Acetaminophen] Itching    Family History  Problem Relation Age of Onset    Heart attack Father    Hypertension Father    Diabetes Father    Heart failure Father    Hypertension Mother    Gastric cancer Sister 58       d. 25   Prostate cancer Brother 8   Melanoma Maternal Aunt    Stroke Maternal Uncle    Stroke Paternal Uncle    Melanoma Maternal Grandmother        dx under 66   Colon cancer Maternal Grandmother    Heart disease Maternal Grandfather    Heart attack Paternal Grandfather      Prior to Admission medications   Medication Sig Start Date End Date Taking? Authorizing Provider  cyanocobalamin (,VITAMIN B-12,) 1000 MCG/ML injection Inject 1,000 mcg into the muscle every 30 (thirty) days.   Yes [provider]  dexamethasone (DECADRON) 4 MG tablet Take 1 tablet (4 mg total) by mouth daily. Decadron 4 mg once a day for a week then half a tablet once a day for a week and then stop. 09/04/18  Yes [name], MD  dronabinol (MARINOL) 2.5 MG capsule Take 1 capsule (2.5 mg total) by mouth 2 (two) times daily before a meal. 09/06/18  Yes Shadad, Mathis Dad, MD  DULoxetine (CYMBALTA) 30 MG capsule Take 1 capsule (30 mg total) by mouth daily. 08/29/18  Yes [name], MD  fentaNYL (DURAGESIC) 100 MCG/HR Place 1 patch onto the skin every 3 (three) days. 08/05/18  Yes [name], MD  HYDROmorphone (DILAUDID) 2 MG tablet Take 1 tablet (2 mg total) by mouth every 4 (four) hours as needed for severe pain. 08/28/18  Yes [name], MD  lidocaine-prilocaine (EMLA) cream Apply 1 application topically as needed. 08/05/18  Yes [name], MD  megestrol (MEGACE) 400 MG/10ML suspension Take 10 mLs (400 mg total) by mouth 2 (two) times daily. 08/29/18  Yes [name], MD  Multiple Vitamin (MULTIVITAMIN WITH MINERALS) TABS tablet Take 1 tablet by mouth daily.   Yes [provider]  ondansetron (ZOFRAN) 4 MG tablet Take 1 tablet (4 mg total) by mouth every 6 (six) hours as needed for nausea. 08/29/18  Yes [name], MD    pantoprazole (PROTONIX) 40 MG tablet Take 1 tablet (40 mg total) by mouth daily. 08/29/18  Yes [name], MD  polyethylene glycol (MIRALAX / GLYCOLAX) 17 g packet Take 17 g by mouth 2 (two) times daily. 08/02/18  Yes Sheikh, Omair Latif, DO  senna-docusate (SENOKOT-S) 8.6-50 MG tablet Take 2 tablets by mouth 2 (two) times daily. 08/02/18  Yes Sheikh, Omair Latif, DO  acetaminophen (TYLENOL) 325 MG tablet Take 2 tablets (650 mg total) by mouth every 6 (six) hours as needed for mild pain (or Fever >/= 101). Patient not taking: Reported on 09/06/2018 08/02/18   Raiford Noble Latif, DO  alum & mag hydroxide-simeth (MAALOX/MYLANTA) [phone]  MG/5ML suspension Take 15 mLs by mouth every 6 (six) hours as needed for indigestion or heartburn. Patient not taking: Reported on 09/06/2018 08/02/18   Kerney Elbe, DO    Physical Exam: Vitals:   09/07/18 0030 09/07/18 0100 09/07/18 0130 09/07/18 0200  BP: 93/61 90/67 94/70  104/76  Pulse: (!) 109 (!) 103 (!) 102 (!) 106  Resp: 15 15 19  (!) 22  Temp:      TempSrc:      SpO2: 100% 100% 100% 100%      Constitutional: Obtunded not communicating Vitals:   09/07/18 0030 09/07/18 0100 09/07/18 0130 09/07/18 0200  BP: 93/61 90/67 94/70  104/76  Pulse: (!) 109 (!) 103 (!) 102 (!) 106  Resp: 15 15 19  (!) 22  Temp:      TempSrc:      SpO2: 100% 100% 100% 100%   Eyes: PERRL, lids and conjunctivae normal, sunken eyes ENMT: Mucous membranes are very dry. Posterior pharynx clear of any exudate or lesions.Normal dentition.  Neck: normal, supple, no masses, no thyromegaly Respiratory: clear to auscultation bilaterally, no wheezing, no crackles. Normal respiratory effort. No accessory muscle use.  Cardiovascular: Sinus tachycardia, no murmurs / rubs / gallops. No extremity edema. 2+ pedal pulses. No carotid bruits.  Abdomen: Scaphoid, no tenderness, no masses palpated. No hepatosplenomegaly. Bowel sounds positive.  Musculoskeletal: no clubbing /  cyanosis. No joint deformity upper and lower extremities. Good ROM, no contractures. Normal muscle tone.  Skin: no rashes, lesions, ulcers. No induration Neurologic: CN 2-12 grossly intact. Sensation intact, DTR normal. Strength 5/5 in all 4.  Psychiatric: Obtunded, mild response to pain.     Labs on Admission: I have personally reviewed following labs and imaging studies  CBC: Recent Labs  Lab 08/31/18 1835 09/02/18 0922 09/03/18 0912 09/06/18 2201  WBC 9.1 8.3 10.1 6.0  NEUTROABS 5.7 5.4 6.3 5.7  HGB 12.1* 11.3* 11.2* 10.3*  HCT 38.1* 35.1* 34.3* 34.5*  MCV 95.3 94.6 92.7 101.8*  PLT 368 405* 438* [id number]   Basic Metabolic Panel: Recent Labs  Lab 08/31/18 1835 09/02/18 0922 09/03/18 0912 09/06/18 2201  NA 134* 140 143 148*  K 5.3* 5.1 4.3 4.9  CL 104 112* 114* 127*  CO2 20* 16* 16* 13*  GLUCOSE 133* 175* 144* 153*  BUN 82* 51* 49* 98*  CREATININE 1.33* 0.99 0.84 1.43*  CALCIUM 9.6 10.1 10.3 10.0   GFR: Estimated Creatinine Clearance: 57.5 mL/min (A) (by C-G formula based on SCr of 1.43 mg/dL (H)). Liver Function Tests: Recent Labs  Lab 08/31/18 1835 09/02/18 0922 09/03/18 0912 09/06/18 2201  AST 21 18 18 30   ALT 38 29 26 39  ALKPHOS 347* 318* 323* 224*  BILITOT 1.1 0.6 0.7 0.9  PROT 7.0 7.0 7.1 7.1  ALBUMIN 2.8* 2.6* 2.7* 2.9*   No results for input(s): LIPASE, AMYLASE in the last 168 hours. No results for input(s): AMMONIA in the last 168 hours. Coagulation Profile: Recent Labs  Lab 09/06/18 2201  INR 1.1   Cardiac Enzymes: No results for input(s): CKTOTAL, CKMB, CKMBINDEX, TROPONINI in the last 168 hours. BNP (last 3 results) No results for input(s): PROBNP in the last 8760 hours. HbA1C: No results for input(s): HGBA1C in the last 72 hours. CBG: No results for input(s): GLUCAP in the last 168 hours. Lipid Profile: No results for input(s): CHOL, HDL, LDLCALC, TRIG, CHOLHDL, LDLDIRECT in the last 72 hours. Thyroid Function Tests: No results for  input(s): TSH, T4TOTAL, FREET4, T3FREE, THYROIDAB in the last  72 hours. Anemia Panel: No results for input(s): VITAMINB12, FOLATE, FERRITIN, TIBC, IRON, RETICCTPCT in the last 72 hours. Urine analysis:    Component Value Date/Time   COLORURINE YELLOW 09/07/2018 0000   APPEARANCEUR TURBID (A) 09/07/2018 0000   LABSPEC 1.010 09/07/2018 0000   PHURINE 9.0 (H) 09/07/2018 0000   GLUCOSEU NEGATIVE 09/07/2018 0000   HGBUR SMALL (A) 09/07/2018 0000   BILIRUBINUR NEGATIVE 09/07/2018 0000   KETONESUR NEGATIVE 09/07/2018 0000   PROTEINUR >=300 (A) 09/07/2018 0000   NITRITE NEGATIVE 09/07/2018 0000   LEUKOCYTESUR MODERATE (A) 09/07/2018 0000   Sepsis Labs: @LABRCNTIP (procalcitonin:4,lacticidven:4) ) Recent Results (from the past 240 hour(s))  Urine culture     Status: Abnormal   Collection Time: 08/31/18  6:45 PM   Specimen: Urine, Catheterized  Result Value Ref Range Status   Specimen Description   Final    URINE, CATHETERIZED Performed at [facility], [address], [place], [place] [address]    Special Requests   Final    NONE Performed at [facility], [address] [place], [place] [address]    Culture (A)  Final    >=100,000 COLONIES/mL ESCHERICHIA COLI >=100,000 COLONIES/mL KLEBSIELLA PNEUMONIAE    Report Status 09/03/2018 FINAL  Final   Organism ID, Bacteria ESCHERICHIA COLI (A)  Final   Organism ID, Bacteria KLEBSIELLA PNEUMONIAE (A)  Final      Susceptibility   Escherichia coli - MIC*    AMPICILLIN <=2 SENSITIVE Sensitive     CEFAZOLIN <=4 SENSITIVE Sensitive     CEFTRIAXONE <=1 SENSITIVE Sensitive     CIPROFLOXACIN <=0.25 SENSITIVE Sensitive     GENTAMICIN <=1 SENSITIVE Sensitive     IMIPENEM <=0.25 SENSITIVE Sensitive     NITROFURANTOIN <=16 SENSITIVE Sensitive     TRIMETH/SULFA <=20 SENSITIVE Sensitive     AMPICILLIN/SULBACTAM <=2 SENSITIVE Sensitive     PIP/TAZO <=4 SENSITIVE Sensitive     Extended ESBL NEGATIVE Sensitive     * >=100,000 COLONIES/mL  ESCHERICHIA COLI   Klebsiella pneumoniae - MIC*    AMPICILLIN >=32 RESISTANT Resistant     CEFAZOLIN <=4 SENSITIVE Sensitive     CEFTRIAXONE <=1 SENSITIVE Sensitive     CIPROFLOXACIN <=0.25 SENSITIVE Sensitive     GENTAMICIN <=1 SENSITIVE Sensitive     IMIPENEM <=0.25 SENSITIVE Sensitive     NITROFURANTOIN 64 INTERMEDIATE Intermediate     TRIMETH/SULFA <=20 SENSITIVE Sensitive     AMPICILLIN/SULBACTAM 4 SENSITIVE Sensitive     PIP/TAZO <=4 SENSITIVE Sensitive     Extended ESBL NEGATIVE Sensitive     * >=100,000 COLONIES/mL KLEBSIELLA PNEUMONIAE     Radiological Exams on Admission: Dg Chest 2 View  Result Date: 09/06/2018 CLINICAL DATA:  Suspected sepsis. EXAM: CHEST - 2 VIEW COMPARISON:  Radiographs and CT 08/02/2018 FINDINGS: Low lung volumes. Right chest port remains in place with tip in the SVC. Normal heart size. Unchanged mediastinal contours. Streaky atelectasis in both lung bases. No pulmonary edema, large pleural effusion or pneumothorax. Numerous osseous lesions including the ribs, spine, and sternum, better appreciated on prior CT. IMPRESSION: Low lung volumes with bibasilar atelectasis. Osseous metastatic disease as characterized on prior CT. Electronically Signed   By: Keith Rake M.D.   On: 09/06/2018 22:34   Ct Head Wo Contrast  Result Date: 09/07/2018 CLINICAL DATA:  Altered mental status (AMS), unclear cause. History of metastatic bladder/prostate cancer. EXAM: CT HEAD WITHOUT CONTRAST TECHNIQUE: Contiguous axial images were obtained from the base of the skull through the vertex without intravenous contrast.  COMPARISON:  Head CT 03/06/2010 FINDINGS: Brain: No evidence of acute infarction, hemorrhage, hydrocephalus, extra-axial collection or mass lesion/mass effect. Generalized atrophy which is age advanced. Periventricular white matter changes most prominent in the bifrontal region, progressed from 2012 CT. Vascular: No hyperdense vessel. Skull: Arachnoid granulations in  the left frontal bone, unchanged from remote CT. No fracture or focal lesion. Sinuses/Orbits: Paranasal sinuses and mastoid air cells are clear. The visualized orbits are unremarkable. Other: None. IMPRESSION: 1. No acute intracranial abnormality. 2. Generalized atrophy, prominent for age. Periventricular white matter changes nonspecific, most typically chronic small vessel ischemia, age advanced and progressed from 2012. Electronically Signed   By: Keith Rake M.D.   On: 09/07/2018 00:53    EKG: Independently reviewed.  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  Blood and urine cultures obtained.  Follow results and adjust antibiotics.  Initiate Rocephin for now.  #2 altered mental status: Most likely metabolic encephalopathy.  Multiple causes.  Treat underlying causes and monitor him mental status.  #3 metastatic prostate cancer: Patient still getting chemo and is a full code.  May consult oncology in the morning.  #4 acute kidney injury: Most likely prerenal.  Hydrate and follow renal function.  May get renal ultrasound if no significant improvement  #5 hypernatremia: Secondary to dehydration.  Hydrate with hypotonic saline.     DVT prophylaxis: Lovenox Code Status: Full code Family Communication: Wife at bedside Disposition Plan: To be determined Consults called: None Admission status: Inpatient  Severity of Illness: The appropriate patient status for this patient is INPATIENT. Inpatient status is judged to be reasonable and necessary in  order to provide the required intensity of service to ensure the patient's safety. The patient's presenting symptoms, physical exam findings, and initial radiographic and laboratory data in the context of their chronic comorbidities is felt to place them at high risk for further clinical deterioration. Furthermore, it is not anticipated that the patient will be medically stable for discharge from the hospital within 2 midnights of admission. The following factors support the patient status of inpatient.   " The patient's presenting symptoms include altered mental status and weakness. " The worrisome physical exam findings include altered mental status with significant dehydration. " The initial radiographic and laboratory data are worrisome because of laboratory findings of dehydration and acute kidney injury. " The chronic co-morbidities include metastatic prostate cancer.   * I certify that at the point of admission it is my clinical judgment that the patient will require inpatient hospital care spanning beyond 2 midnights from the point of admission due to high intensity of service, high risk for further deterioration and high frequency of surveillance required.Barbette Merino MD [facility] Pager [phone]  If 7PM-7AM, please contact night-coverage [email or web address] Password [email or web address]  09/07/2018, 2:48 AM

## 2018-09-07 NOTE — Consult Note (Signed)
Consultation Note Date: 09/07/2018   Patient Name: Rodney [name]  DOB: 05-26-1970  MRN: [id number]  Age / Sex: 48 y.o., male  PCP: Curlene Labrum, MD Referring Physician: Eugenie Filler, MD  Reason for Consultation: Establishing goals of care and Pain control  HPI/Patient Profile: 48 y.o. male  admitted on 09/06/2018    Clinical Assessment and Goals of Care: Rodney [name] is a 48 year old gentleman known to palliative medicine service from previous hospitalization.  Life limiting illness of bladder cancer, stage IV involvement of bone lung and liver detected in July 2020.  Also with history of prostate cancer status post prostatectomy.  Patient has recently finished radiation to cervical and thoracic spine.  Patient was recently on palliative chemotherapy and regimen was changed for cycle #2.  Patient admitted with fall, precipitous functional decline since the last 1-2 weeks.  Not able to stand up on his own.  Not able to eat much.  History obtained through patient's daughter via phone call.  Discussed with her about scope of current hospitalization.  She recalls meeting and discussing with my palliative colleagues in previous hospitalization.  Discussed frankly with her my concerns that the patient does appear to have progressive decline and possibly also worsening of his condition.  Offered active listening and supportive care.  See additional discussion/recommendations below.  Thank you for the consult.  NEXT OF KIN Daughter Rodney [name]. Rodney [name] is his fiance. Healthcare power of attorney agent paperwork has not been completed.  Previous documentation records show that when the patient was oriented, he had wanted [name] to make medical decisions on his behalf.  SUMMARY OF RECOMMENDATIONS   Full code full scope for now Palliative medicine team to continue to address symptom management  needs patient on transdermal fentanyl and p.o. Dilaudid.  Also on bowel and antiemetic regimen Call placed and discussed with daughter Rodney [name] at [id number].  She is asking whether or not repeat CT scans are going to be done.  Discussed with her about scope of current hospitalization. Additive medicine team will continue to follow and help guide appropriate decision making, disposition planning.  Code Status/Advance Care Planning:  Full code    Symptom Management:    as above   Palliative Prophylaxis:   Delirium Protocol  Additional Recommendations (Limitations, Scope, Preferences):  Full Scope Treatment  Psycho-social/Spiritual:   Desire for further Chaplaincy support:yes  Additional Recommendations: Caregiving  Support/Resources  Prognosis:   Unable to determine  Discharge Planning: To Be Determined      Primary Diagnoses: Present on Admission: . UTI (urinary tract infection) . Malignant neoplasm of prostate ([place]) . 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 ARF (acute renal failure) ([place]) . Dehydration . Hypernatremia . AMS (altered mental status)   I have reviewed the medical record, interviewed the patient and family, and examined the patient. The following aspects are pertinent.  Past Medical History:  Diagnosis Date  . Anxiety   . Bladder tumor   . Blood  in urine    saw some 3 days ago but none inthe last 2 days   . Bradycardia   . Depression   . Dizziness    historical   . Elevated PSA   . Family history of gastric cancer   . Heartburn    occasional   . Pain with urination   . Prostate cancer ([place])   . 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 Marital status: Significant Other    Spouse name: Not on file  . Number of children: Not on file  . Years of education: Not on file  . Highest education level: Not on file  Occupational History  . Not on file   Social Needs  . Financial resource strain: Not on file  . Food insecurity    Worry: Not on file    Inability: Not on file  . Transportation needs    Medical: Not on file    Non-medical: Not on file  Tobacco Use  . Smoking status: Former Smoker    Packs/day: 2.00    Years: 8.00    Pack years: 16.00    Types: Cigarettes    Quit date: 05/29/2016    Years since quitting: 2.2  . Smokeless tobacco: Former Systems developer  . Tobacco comment: quti 2009  Substance and Sexual Activity  . Alcohol use: Not Currently  . Drug use: Not Currently    Types: Marijuana    Comment: last use was 05-29-2017  . Sexual activity: Yes  Lifestyle  . Physical activity    Days per week: Not on file    Minutes per session: Not on file  . Stress: Not on file  Relationships  . Social Herbalist on phone: Not on file    Gets together: Not on file    Attends religious service: Not on file    Active member of club or organization: Not on file    Attends meetings of clubs or organizations: Not on file    Relationship status: Not on file  Other Topics Concern  . Not on file  Social History Narrative  . Not on file   Family History  Problem Relation Age of Onset  . Heart attack Father   . Hypertension Father   . Diabetes Father   . Heart failure Father   . Hypertension Mother   . Gastric cancer Sister 55       d. 42  . Prostate cancer Brother 61  . Melanoma Maternal Aunt   . Stroke Maternal Uncle   . Stroke Paternal Uncle   . Melanoma Maternal Grandmother        dx under 59  . Colon cancer Maternal Grandmother   . Heart disease Maternal Grandfather   . Heart attack Paternal Grandfather    Scheduled Meds: . cyanocobalamin  1,000 mcg Intramuscular Q30 days  . [START ON 09/09/2018] dexamethasone  2 mg Oral Daily  . dexamethasone  4 mg Oral Daily  . dronabinol  2.5 mg Oral BID AC  . DULoxetine  30 mg Oral Daily  . enoxaparin (LOVENOX) injection  40 mg Subcutaneous Daily  . [START ON 09/09/2018]  fentaNYL  1 patch Transdermal Q72H  . magic mouthwash w/lidocaine  15 mL Oral QID  . megestrol  400 mg Oral BID  . multivitamin with minerals  1 tablet Oral Daily  . nystatin  5 mL Oral QID  . pantoprazole  40 mg Oral  Daily  . polyethylene glycol  17 g Oral BID  . senna-docusate  2 tablet Oral BID   Continuous Infusions: . [START ON 09/08/2018] cefTRIAXone (ROCEPHIN)  IV    . [START ON 09/08/2018] fluconazole (DIFLUCAN) IV    . fluconazole (DIFLUCAN) IV    . sodium bicarbonate 150 mEq in dextrose 5% 1000 mL 150 mEq (09/07/18 1122)   PRN Meds:.HYDROmorphone, lidocaine-prilocaine, ondansetron **OR** ondansetron (ZOFRAN) IV, ondansetron, sodium chloride flush Medications Prior to Admission:  Prior to Admission medications   Medication Sig Start Date End Date Taking? Authorizing Provider  cyanocobalamin (,VITAMIN B-12,) 1000 MCG/ML injection Inject 1,000 mcg into the muscle every 30 (thirty) days.   Yes [provider]  dexamethasone (DECADRON) 4 MG tablet Take 1 tablet (4 mg total) by mouth daily. Decadron 4 mg once a day for a week then half a tablet once a day for a week and then stop. 09/04/18  Yes [name], MD  dronabinol (MARINOL) 2.5 MG capsule Take 1 capsule (2.5 mg total) by mouth 2 (two) times daily before a meal. 09/06/18  Yes Shadad, Mathis Dad, MD  DULoxetine (CYMBALTA) 30 MG capsule Take 1 capsule (30 mg total) by mouth daily. 08/29/18  Yes [name], MD  fentaNYL (DURAGESIC) 100 MCG/HR Place 1 patch onto the skin every 3 (three) days. 08/05/18  Yes [name], MD  HYDROmorphone (DILAUDID) 2 MG tablet Take 1 tablet (2 mg total) by mouth every 4 (four) hours as needed for severe pain. 08/28/18  Yes [name], MD  lidocaine-prilocaine (EMLA) cream Apply 1 application topically as needed. 08/05/18  Yes [name], MD  megestrol (MEGACE) 400 MG/10ML suspension Take 10 mLs (400 mg total) by mouth 2 (two) times daily. 08/29/18  Yes [name], MD   Multiple Vitamin (MULTIVITAMIN WITH MINERALS) TABS tablet Take 1 tablet by mouth daily.   Yes [provider]  ondansetron (ZOFRAN) 4 MG tablet Take 1 tablet (4 mg total) by mouth every 6 (six) hours as needed for nausea. 08/29/18  Yes [name], MD  pantoprazole (PROTONIX) 40 MG tablet Take 1 tablet (40 mg total) by mouth daily. 08/29/18  Yes [name], MD  polyethylene glycol (MIRALAX / GLYCOLAX) 17 g packet Take 17 g by mouth 2 (two) times daily. 08/02/18  Yes Sheikh, Omair Latif, DO  senna-docusate (SENOKOT-S) 8.6-50 MG tablet Take 2 tablets by mouth 2 (two) times daily. 08/02/18  Yes Sheikh, Omair Latif, DO  acetaminophen (TYLENOL) 325 MG tablet Take 2 tablets (650 mg total) by mouth every 6 (six) hours as needed for mild pain (or Fever >/= 101). Patient not taking: Reported on 09/06/2018 08/02/18   Raiford Noble Latif, DO  alum & mag hydroxide-simeth (MAALOX/MYLANTA) 200-200-20 MG/5ML suspension Take 15 mLs by mouth every 6 (six) hours as needed for indigestion or heartburn. Patient not taking: Reported on 09/06/2018 08/02/18   Raiford Noble Latif, DO   Allergies  Allergen Reactions  . Morphine And Related Itching  . Vicodin [Hydrocodone-Acetaminophen] Itching   Review of Systems Non verbal  Physical Exam Asleep, reported to have been awakening some later today Respirations are unlabored In no distress S1 S2 Appears more weaker and with generalized decline.  Vital Signs: BP (!) 87/53 (BP Location: Right Arm)   Pulse (!) 117   Temp 99.5 F (37.5 C) (Oral)   Resp 19   Ht 5\' 10"  (1.778 m)   SpO2 99%   BMI 20.81 kg/m  Pain Scale: FLACC       SpO2: SpO2: 99 % O2 Device:SpO2: 99 % O2 Flow Rate: .   IO: Intake/output summary:   Intake/Output Summary (Last 24 hours) at 09/07/2018 1518 Last data filed at 09/07/2018 1433 Gross per 24 hour  Intake 240 ml  Output 650 ml  Net -410 ml    LBM: Last BM Date: 09/05/18 Baseline Weight:   Most recent weight:        Palliative Assessment/Data:   PPS 30%  Time In:  1400 Time Out:  1500 Time Total:  60 min  Greater than 50%  of this time was spent counseling and coordinating care related to the above assessment and plan.  Signed by: Loistine Chance, MD  [id number] Please contact Palliative Medicine Team phone at [phone] for questions and concerns.  For individual provider: See Shea Evans

## 2018-09-07 NOTE — ED Notes (Signed)
ED TO INPATIENT HANDOFF REPORT  Name/Age/Gender Rodney [name] 48 y.o. male  Code Status Code Status History    Date Active Date Inactive Code Status Order ID Comments User Context   07/22/2018 1558 08/02/2018 2326 Full Code [id number]  Micheline Rough, MD Inpatient   07/22/2018 1327 07/22/2018 1558 DNR [id number]  Georgette Shell, MD Inpatient   07/20/2018 2217 07/22/2018 1327 Full Code [id number]  Norval Morton, MD ED   11/08/2017 1740 11/14/2017 1823 Full Code [id number]  Raynelle Bring, MD Inpatient   Advance Care Planning Activity      Home/SNF/Other Home  Chief Complaint confuse/stage 4 cancer/dehydration  Level of Care/Admitting Diagnosis ED Disposition    ED Disposition Condition [place] [facility]: [place] [[id number]]  Level of Care: Med-Surg [16]  Covid Evaluation: Asymptomatic Screening Protocol (No Symptoms)  Diagnosis: UTI (urinary tract infection) [id number]  Admitting Physician: Elwyn Reach [[id number]]  Attending Physician: Elwyn Reach [[id number]]  Estimated length of stay: past midnight tomorrow  Certification:: I certify this patient will need inpatient services for at least 2 midnights  PT Class (Do Not Modify): Inpatient [101]  PT Acc Code (Do Not Modify): Private [1]       Medical History Past Medical History:  Diagnosis Date  . Anxiety   . Bladder tumor   . Blood in urine    saw some 3 days ago but none inthe last 2 days   . Bradycardia   . Depression   . Dizziness    historical   . Elevated PSA   . Family history of gastric cancer   . Heartburn    occasional   . Pain with urination   . Prostate cancer ([place])   . 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 Morphine And Related Itching  . Vicodin [Hydrocodone-Acetaminophen] Itching    IV Location/Drains/Wounds Patient Lines/Drains/Airways Status   Active Line/Drains/Airways    Name:   Placement date:   Placement time:   Site:    Days:   Implanted Port 08/07/18   08/07/18    0805    -   31   External Urinary Catheter   09/07/18    0239    -   less than 1   Incision (Closed) 07/22/18 Hip Left;Posterior   07/22/18    1245     47          Labs/Imaging Results for orders placed or performed during the hospital encounter of 09/06/18 (from the past 48 hour(s))  Comprehensive metabolic panel     Status: Abnormal   Collection Time: 09/06/18 10:01 PM  Result Value Ref Range   Sodium 148 (H) 135 - 145 mmol/L   Potassium 4.9 3.5 - 5.1 mmol/L   Chloride 127 (H) 98 - 111 mmol/L   CO2 13 (L) 22 - 32 mmol/L   Glucose, Bld 153 (H) 70 - 99 mg/dL   BUN 98 (H) 6 - 20 mg/dL   Creatinine, Ser 1.43 (H) 0.61 - 1.24 mg/dL   Calcium 10.0 8.9 - 10.3 mg/dL   Total Protein 7.1 6.5 - 8.1 g/dL   Albumin 2.9 (L) 3.5 - 5.0 g/dL   AST 30 15 - 41 U/L   ALT 39 0 - 44 U/L   Alkaline Phosphatase 224 (H) 38 - 126 U/L   Total Bilirubin 0.9 0.3 - 1.2 mg/dL   GFR calc non Af Amer 57 (L) >60  mL/min   GFR calc Af Amer >60 >60 mL/min   Anion gap 8 5 - 15    Comment: Performed at [facility], [place] [address] [place], [place] [address]  Lactic acid, plasma     Status: None   Collection Time: 09/06/18 10:01 PM  Result Value Ref Range   Lactic Acid, Venous 1.3 0.5 - 1.9 mmol/L    Comment: Performed at [facility], [place] [address], [place], [place] [address]  CBC with Differential     Status: Abnormal   Collection Time: 09/06/18 10:01 PM  Result Value Ref Range   WBC 6.0 4.0 - 10.5 K/uL   RBC 3.39 (L) 4.22 - 5.81 MIL/uL   Hemoglobin 10.3 (L) 13.0 - 17.0 g/dL   HCT 34.5 (L) 39.0 - 52.0 %   MCV 101.8 (H) 80.0 - 100.0 fL   MCH 30.4 26.0 - 34.0 pg   MCHC 29.9 (L) 30.0 - 36.0 g/dL   RDW 19.7 (H) 11.5 - 15.5 %   Platelets 325 150 - 400 K/uL   nRBC 0.0 0.0 - 0.2 %   Neutrophils Relative % 95 %   Neutro Abs 5.7 1.7 - 7.7 K/uL   Lymphocytes Relative 3 %   Lymphs Abs 0.2 (L) 0.7 - 4.0 K/uL   Monocytes  Relative 1 %   Monocytes Absolute 0.1 0.1 - 1.0 K/uL   Eosinophils Relative 0 %   Eosinophils Absolute 0.0 0.0 - 0.5 K/uL   Basophils Relative 0 %   Basophils Absolute 0.0 0.0 - 0.1 K/uL   WBC Morphology DOHLE BODIES     Comment: TOXIC GRANULATION   Immature Granulocytes 1 %   Abs Immature Granulocytes 0.06 0.00 - 0.07 K/uL    Comment: Performed at [facility], [place] [address] [place], [place] [address]  Protime-INR     Status: None   Collection Time: 09/06/18 10:01 PM  Result Value Ref Range   Prothrombin Time 14.4 11.4 - 15.2 seconds   INR 1.1 0.8 - 1.2    Comment: (NOTE) INR goal varies based on device and disease states. Performed at [facility], [place] [address], [place], [place] [address]   Urinalysis, Routine w reflex microscopic     Status: Abnormal   Collection Time: 09/07/18 12:00 AM  Result Value Ref Range   Color, Urine YELLOW YELLOW   APPearance TURBID (A) CLEAR   Specific Gravity, Urine 1.010 1.005 - 1.030   pH 9.0 (H) 5.0 - 8.0   Glucose, UA NEGATIVE NEGATIVE mg/dL   Hgb urine dipstick SMALL (A) NEGATIVE   Bilirubin Urine NEGATIVE NEGATIVE   Ketones, ur NEGATIVE NEGATIVE mg/dL   Protein, ur >=300 (A) NEGATIVE mg/dL   Nitrite NEGATIVE NEGATIVE   Leukocytes,Ua MODERATE (A) NEGATIVE   RBC / HPF 6-10 0 - 5 RBC/hpf   WBC, UA 21-50 0 - 5 WBC/hpf   Bacteria, UA MANY (A) NONE SEEN   Mucus PRESENT    Budding Yeast PRESENT    Triple Phosphate Crystal PRESENT     Comment: Performed at [facility], [place] [address] [place], [place] [address]  Lactic acid, plasma     Status: None   Collection Time: 09/07/18 12:08 AM  Result Value Ref Range   Lactic Acid, Venous 0.9 0.5 - 1.9 mmol/L    Comment: Performed at [facility], [place] [address] [place],  [address]   Dg Chest 2 View  Result Date:  09/06/2018 CLINICAL DATA:  Suspected sepsis. EXAM: CHEST - 2 VIEW COMPARISON:  Radiographs and CT  08/02/2018 FINDINGS: Low lung volumes. Right chest port remains in place with tip in the SVC. Normal heart size. Unchanged mediastinal contours. Streaky atelectasis in both lung bases. No pulmonary edema, large pleural effusion or pneumothorax. Numerous osseous lesions including the ribs, spine, and sternum, better appreciated on prior CT. IMPRESSION: Low lung volumes with bibasilar atelectasis. Osseous metastatic disease as characterized on prior CT. Electronically Signed   By: Keith Rake M.D.   On: 09/06/2018 22:34   Ct Head Wo Contrast  Result Date: 09/07/2018 CLINICAL DATA:  Altered mental status (AMS), unclear cause. History of metastatic bladder/prostate cancer. EXAM: CT HEAD WITHOUT CONTRAST TECHNIQUE: Contiguous axial images were obtained from the base of the skull through the vertex without intravenous contrast. COMPARISON:  Head CT 03/06/2010 FINDINGS: Brain: No evidence of acute infarction, hemorrhage, hydrocephalus, extra-axial collection or mass lesion/mass effect. Generalized atrophy which is age advanced. Periventricular white matter changes most prominent in the bifrontal region, progressed from 2012 CT. Vascular: No hyperdense vessel. Skull: Arachnoid granulations in the left frontal bone, unchanged from remote CT. No fracture or focal lesion. Sinuses/Orbits: Paranasal sinuses and mastoid air cells are clear. The visualized orbits are unremarkable. Other: None. IMPRESSION: 1. No acute intracranial abnormality. 2. Generalized atrophy, prominent for age. Periventricular white matter changes nonspecific, most typically chronic small vessel ischemia, age advanced and progressed from 2012. Electronically Signed   By: Keith Rake M.D.   On: 09/07/2018 00:53    Pending Labs Unresulted Labs (From admission, onward)    Start     Ordered   09/07/18 0335  SARS Coronavirus 2 [facility] order, Performed in [facility] hospital lab) Nasopharyngeal Nasopharyngeal Swab  (Symptomatic/High  Risk/Tier 1)  Once,   STAT    Question Answer Comment  Is this test for diagnosis or screening Diagnosis of ill patient   Symptomatic for COVID-19 as defined by CDC Yes   Date of Symptom Onset 09/06/2018   Hospitalized for COVID-19 Yes   Admitted to ICU for COVID-19 No   Previously tested for COVID-19 Yes   Resident in a congregate (group) care setting No   Employed in healthcare setting No      09/07/18 0335   09/06/18 2339  Urine culture  ONCE - STAT,   STAT     09/06/18 2339   09/06/18 2201  Culture, blood (Routine x 2)  BLOOD CULTURE X 2,   STAT     09/06/18 2200   Signed and Held  HIV antibody (Routine Testing)  Once,   R     Signed and Held   Signed and Held  CBC  (enoxaparin (LOVENOX)    CrCl >/= 30 ml/min)  Once,   R    Comments: Baseline for enoxaparin therapy IF NOT ALREADY DRAWN.  Notify MD if PLT < 100 K.    Signed and Held   Signed and Held  Creatinine, serum  (enoxaparin (LOVENOX)    CrCl >/= 30 ml/min)  Once,   R    Comments: Baseline for enoxaparin therapy IF NOT ALREADY DRAWN.    Signed and Held   Signed and Held  Creatinine, serum  (enoxaparin (LOVENOX)    CrCl >/= 30 ml/min)  Weekly,   R    Comments: while on enoxaparin therapy    Signed and Held   Signed and Held  Comprehensive metabolic panel  Tomorrow morning,   R  Signed and Held   Signed and Held  CBC  Tomorrow morning,   R     Signed and Held          Vitals/Pain Today's Vitals   09/07/18 0130 09/07/18 0200 09/07/18 0230 09/07/18 0330  BP: 94/70 104/76 99/75 90/65   Pulse: (!) 102 (!) 106 (!) 116 (!) 109  Resp: 19 (!) 22 20 15   Temp:      TempSrc:      SpO2: 100% 100% 100% 98%    Isolation Precautions No active isolations  Medications Medications  sodium chloride 0.9 % bolus 1,974 mL (0 mL/kg  65.8 kg Intravenous Stopped 09/07/18 0202)  piperacillin-tazobactam (ZOSYN) IVPB 3.375 g (0 g Intravenous Stopped 09/07/18 0040)    Mobility non-ambulatory

## 2018-09-07 NOTE — Progress Notes (Signed)
PHARMACY - PHYSICIAN COMMUNICATION CRITICAL VALUE ALERT - BLOOD CULTURE IDENTIFICATION (BCID)  MARTA LAYDEN is an 48 y.o. male who presented to [facility] on 09/06/2018 with a chief complaint of metastatic prostate cancer  Assessment:  kleb pneumo bacteremia  Name of physician (or Provider) Contacted: n/a  Current antibiotics: Rocephin 2g IV daliy  Changes to prescribed antibiotics recommended:  NO changes  No results found for this or any previous visit.  Kara Mead 09/07/2018  5:44 PM

## 2018-09-07 NOTE — Progress Notes (Signed)
Rodney [name]   DOB:02/10/1970   [id number]   [id number]  Subjective:  I was called last night by the answering service telling me the patient's caregiver had called 911 and transport to [facility] was refused. I do not have further documentation of this at this point. It is not clear to me if pt's fiancee brought the patient in or if he came by ambulance. I have asked the patient's nurse this AM to help [place] sort this story out. It may warrant a safety zone portal.  This AM Rodney [name] appears comfortable. He is confused to place and time--does know I am an MD. On direct questioning he admits to some chest wall pain, worse when breathing. No family in room   Objective: middle aged White man examined in bed Vitals:   09/07/18 0507 09/07/18 0724  BP: (!) 87/57 94/82  Pulse: (!) 108 (!) 109  Resp: 20 17  Temp: 98.3 F (36.8 C) 97.6 F (36.4 C)  SpO2: 97% 99%    Body mass index is 20.81 kg/m. No intake or output data in the 24 hours ending 09/07/18 0859   Lungs no rales or wheezes--auscultated anterolaterally  Heart regular rate and rhythm  Abdomen soft, +BS   CBG (last 3)  No results for input(s): GLUCAP in the last 72 hours.   Labs:  Lab Results  Component Value Date   WBC 6.0 09/06/2018   HGB 10.3 (L) 09/06/2018   HCT 34.5 (L) 09/06/2018   MCV 101.8 (H) 09/06/2018   PLT 325 09/06/2018   NEUTROABS 5.7 09/06/2018    @LASTCHEMISTRY @  Urine Studies No results for input(s): UHGB, CRYS in the last 72 hours.  Invalid input(s): UACOL, UAPR, USPG, UPH, UTP, UGL, UKET, UBIL, UNIT, UROB, [place], UEPI, UWBC, [place], [place], [place], [place], [place]  Basic Metabolic Panel: Recent Labs  Lab 08/31/18 1835 09/02/18 0922 09/03/18 0912 09/06/18 2201  NA 134* 140 143 148*  K 5.3* 5.1 4.3 4.9  CL 104 112* 114* 127*  CO2 20* 16* 16* 13*  GLUCOSE 133* 175* 144* 153*  BUN 82* 51* 49* 98*  CREATININE 1.33* 0.99 0.84 1.43*  CALCIUM 9.6 10.1 10.3 10.0   GFR Estimated Creatinine Clearance: 57.5  mL/min (A) (by C-G formula based on SCr of 1.43 mg/dL (H)). Liver Function Tests: Recent Labs  Lab 08/31/18 1835 09/02/18 0922 09/03/18 0912 09/06/18 2201  AST 21 18 18 30   ALT 38 29 26 39  ALKPHOS 347* 318* 323* 224*  BILITOT 1.1 0.6 0.7 0.9  PROT 7.0 7.0 7.1 7.1  ALBUMIN 2.8* 2.6* 2.7* 2.9*   No results for input(s): LIPASE, AMYLASE in the last 168 hours. No results for input(s): AMMONIA in the last 168 hours. Coagulation profile Recent Labs  Lab 09/06/18 2201  INR 1.1    CBC: Recent Labs  Lab 08/31/18 1835 09/02/18 0922 09/03/18 0912 09/06/18 2201  WBC 9.1 8.3 10.1 6.0  NEUTROABS 5.7 5.4 6.3 5.7  HGB 12.1* 11.3* 11.2* 10.3*  HCT 38.1* 35.1* 34.3* 34.5*  MCV 95.3 94.6 92.7 101.8*  PLT 368 405* 438* 325   Cardiac Enzymes: No results for input(s): CKTOTAL, CKMB, CKMBINDEX, TROPONINI in the last 168 hours. BNP: Invalid input(s): POCBNP CBG: No results for input(s): GLUCAP in the last 168 hours. D-Dimer No results for input(s): DDIMER in the last 72 hours. Hgb A1c No results for input(s): HGBA1C in the last 72 hours. Lipid Profile No results for input(s): CHOL, HDL, LDLCALC, TRIG, CHOLHDL, LDLDIRECT in the last  72 hours. Thyroid function studies No results for input(s): TSH, T4TOTAL, T3FREE, THYROIDAB in the last 72 hours.  Invalid input(s): FREET3 Anemia work up No results for input(s): VITAMINB12, FOLATE, FERRITIN, TIBC, IRON, RETICCTPCT in the last 72 hours. Microbiology Recent Results (from the past 240 hour(s))  Urine culture     Status: Abnormal   Collection Time: 08/31/18  6:45 PM   Specimen: Urine, Catheterized  Result Value Ref Range Status   Specimen Description   Final    URINE, CATHETERIZED Performed at [facility], [address] [place], [place] [address]    Special Requests   Final    NONE Performed at [facility], [address] [place], [place] [address]    Culture (A)  Final    >=100,000 COLONIES/mL ESCHERICHIA COLI >=100,000  COLONIES/mL KLEBSIELLA PNEUMONIAE    Report Status 09/03/2018 FINAL  Final   Organism ID, Bacteria ESCHERICHIA COLI (A)  Final   Organism ID, Bacteria KLEBSIELLA PNEUMONIAE (A)  Final      Susceptibility   Escherichia coli - MIC*    AMPICILLIN <=2 SENSITIVE Sensitive     CEFAZOLIN <=4 SENSITIVE Sensitive     CEFTRIAXONE <=1 SENSITIVE Sensitive     CIPROFLOXACIN <=0.25 SENSITIVE Sensitive     GENTAMICIN <=1 SENSITIVE Sensitive     IMIPENEM <=0.25 SENSITIVE Sensitive     NITROFURANTOIN <=16 SENSITIVE Sensitive     TRIMETH/SULFA <=20 SENSITIVE Sensitive     AMPICILLIN/SULBACTAM <=2 SENSITIVE Sensitive     PIP/TAZO <=4 SENSITIVE Sensitive     Extended ESBL NEGATIVE Sensitive     * >=100,000 COLONIES/mL ESCHERICHIA COLI   Klebsiella pneumoniae - MIC*    AMPICILLIN >=32 RESISTANT Resistant     CEFAZOLIN <=4 SENSITIVE Sensitive     CEFTRIAXONE <=1 SENSITIVE Sensitive     CIPROFLOXACIN <=0.25 SENSITIVE Sensitive     GENTAMICIN <=1 SENSITIVE Sensitive     IMIPENEM <=0.25 SENSITIVE Sensitive     NITROFURANTOIN 64 INTERMEDIATE Intermediate     TRIMETH/SULFA <=20 SENSITIVE Sensitive     AMPICILLIN/SULBACTAM 4 SENSITIVE Sensitive     PIP/TAZO <=4 SENSITIVE Sensitive     Extended ESBL NEGATIVE Sensitive     * >=100,000 COLONIES/mL KLEBSIELLA PNEUMONIAE  Culture, blood (Routine x 2)     Status: None (Preliminary result)   Collection Time: 09/06/18 10:01 PM   Specimen: BLOOD  Result Value Ref Range Status   Specimen Description   Final    BLOOD PORTA CATH Performed at [facility], [place] [address], [place], [place] [address]    Special Requests   Final    BOTTLES DRAWN AEROBIC AND ANAEROBIC Blood Culture adequate volume Performed at [facility] [address], [place], [place] [address]    Culture   Final    NO GROWTH < 12 HOURS Performed at [facility] [address] [place], [place] [address]    Report Status PENDING  Incomplete   Culture, blood (Routine x 2)     Status: None (Preliminary result)   Collection Time: 09/06/18 10:06 PM   Specimen: BLOOD  Result Value Ref Range Status   Specimen Description   Final    BLOOD RIGHT ANTECUBITAL Performed at [facility] [address], [place], [place] [address]    Special Requests   Final    BOTTLES DRAWN AEROBIC AND ANAEROBIC Blood Culture adequate volume Performed at [place] [address] [place],  [address]  Culture   Final    NO GROWTH < 12 HOURS Performed at [facility], [place] [address], [place], [place] [address]    Report Status PENDING  Incomplete  SARS Coronavirus 2 [facility] order, Performed in [facility] hospital lab) Nasopharyngeal Nasopharyngeal Swab     Status: None   Collection Time: 09/07/18  3:48 AM   Specimen: Nasopharyngeal Swab  Result Value Ref Range Status   SARS Coronavirus 2 NEGATIVE NEGATIVE Final    Comment: (NOTE) If result is NEGATIVE SARS-CoV-2 target nucleic acids are NOT DETECTED. The SARS-CoV-2 RNA is generally detectable in upper and lower  respiratory specimens during the acute phase of infection. The lowest  concentration of SARS-CoV-2 viral copies this assay can detect is 250  copies / mL. A negative result does not preclude SARS-CoV-2 infection  and should not be used as the sole basis for treatment or other  patient management decisions.  A negative result may occur with  improper specimen collection / handling, submission of specimen other  than nasopharyngeal swab, presence of viral mutation(s) within the  areas targeted by this assay, and inadequate number of viral copies  (<250 copies / mL). A negative result must be combined with clinical  observations, patient history, and epidemiological information. If result is POSITIVE SARS-CoV-2 target nucleic acids are DETECTED. The SARS-CoV-2 RNA is generally detectable in upper and lower  respiratory specimens  dur ing the acute phase of infection.  Positive  results are indicative of active infection with SARS-CoV-2.  Clinical  correlation with patient history and other diagnostic information is  necessary to determine patient infection status.  Positive results do  not rule out bacterial infection or co-infection with other viruses. If result is PRESUMPTIVE POSTIVE SARS-CoV-2 nucleic acids MAY BE PRESENT.   A presumptive positive result was obtained on the submitted specimen  and confirmed on repeat testing.  While 2019 novel coronavirus  (SARS-CoV-2) nucleic acids may be present in the submitted sample  additional confirmatory testing may be necessary for epidemiological  and / or clinical management purposes  to differentiate between  SARS-CoV-2 and other Sarbecovirus currently known to infect humans.  If clinically indicated additional testing with an alternate test  methodology [phone]) is advised. The SARS-CoV-2 RNA is generally  detectable in upper and lower respiratory sp ecimens during the acute  phase of infection. The expected result is Negative. Fact Sheet for Patients:  StrictlyIdeas.no Fact Sheet for Healthcare Providers: BankingDealers.co.za This test is not yet approved or cleared by the [place] FDA and has been authorized for detection and/or diagnosis of SARS-CoV-2 by FDA under an Emergency Use Authorization (EUA).  This EUA will remain in effect (meaning this test can be used) for the duration of the COVID-19 declaration under Section 564(b)(1) of the Act, 21 U.S.C. section 360bbb-3(b)(1), unless the authorization is terminated or revoked sooner. Performed at [facility], [place] [address] [place], [place] [address]       Studies:  Dg Chest 2 View  Result Date: 09/06/2018 CLINICAL DATA:  Suspected sepsis. EXAM: CHEST - 2 VIEW COMPARISON:  Radiographs and CT 08/02/2018 FINDINGS: Low lung  volumes. Right chest port remains in place with tip in the SVC. Normal heart size. Unchanged mediastinal contours. Streaky atelectasis in both lung bases. No pulmonary edema, large pleural effusion or pneumothorax. Numerous osseous lesions including the ribs, spine, and sternum, better appreciated on prior CT. IMPRESSION: Low lung volumes with bibasilar atelectasis. Osseous metastatic disease as characterized on  prior CT. Electronically Signed   By: Keith Rake M.D.   On: 09/06/2018 22:34   Ct Head Wo Contrast  Result Date: 09/07/2018 CLINICAL DATA:  Altered mental status (AMS), unclear cause. History of metastatic bladder/prostate cancer. EXAM: CT HEAD WITHOUT CONTRAST TECHNIQUE: Contiguous axial images were obtained from the base of the skull through the vertex without intravenous contrast. COMPARISON:  Head CT 03/06/2010 FINDINGS: Brain: No evidence of acute infarction, hemorrhage, hydrocephalus, extra-axial collection or mass lesion/mass effect. Generalized atrophy which is age advanced. Periventricular white matter changes most prominent in the bifrontal region, progressed from 2012 CT. Vascular: No hyperdense vessel. Skull: Arachnoid granulations in the left frontal bone, unchanged from remote CT. No fracture or focal lesion. Sinuses/Orbits: Paranasal sinuses and mastoid air cells are clear. The visualized orbits are unremarkable. Other: None. IMPRESSION: 1. No acute intracranial abnormality. 2. Generalized atrophy, prominent for age. Periventricular white matter changes nonspecific, most typically chronic small vessel ischemia, age advanced and progressed from 2012. Electronically Signed   By: Keith Rake M.D.   On: 09/07/2018 00:53    Assessment: 48 y.o. Rodney [name], [place] man with.  1.  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    2.  Prostate cancer diagnosed in July 2019.  He presented with  a Gleason score of 6 and a PSA 5.58 and currently disease-free after prostatectomy.  Prior Therapy:  He is status post a robotic assisted laparoscopic radical cystoprostatectomy and bilateral pelvic lymphadenectomy and neobladder creation completed on November 08, 2017 by Dr. Alinda Money.  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  Prostate adenocarcinoma is 3+4 = 7 with 0 out of 14 lymph nodes noted.  He is status post radiation to the cervical and thoracic spine for a total of 30 Gy in 10 fractions completed on August 06, 2018.  Current therapy: Palliative chemotherapy utilizing cisplatin and gemcitabine with cycle 1 started on 08/13/2018.   With poor tolerance of cycle 1, treatment changed to carboplatin/ gemcitabine, first dose 09/03/2018  PLAN: Mr Boldon is now day 5 cycle 2 chemotherapy. He is not neutropenic but may become neutropenic in next few days. His UA is c/w infection and he had a recent UTI w klebsiella and e coli. He is on zosyn and ceftriaxone.  Dr [name] comments in his most recent note that if patient does not tolerate current treatment well, it would be best to proceed to best-supportive-care/ comfort care under Hospice. Patient is now again admitted with confusion and seems to have a recurrent UTI (CX pending). He is not neutropenic and I expect he will recover with the supportive care he is receiving.   I was not able to discuss advanced directives with the patient, who is confused. Note also that his "wife" is actually his fiancee--I am not sure who the patient's HCPOA may be, if any.  Accordingly I agree with a full code determination for now. Will place a palliative care consult so advanced directives/ HCPOA issues can be clarified. Dr [name] will return Monday 08/24 and can better make a determination whether to continue chemotherapy or not.  Please let me know if I can be of further help   Chauncey Cruel, MD 09/07/2018  8:59 AM Medical  Oncology and Hematology [facility] [address] [place], [place] [address] Tel. [phone]    Fax. [phone]

## 2018-09-07 NOTE — Progress Notes (Signed)
CRITICAL VALUE ALERT  Critical Value: chloride >130  Date & Time Notied: 09/07/2018 1019  Provider Notified: yes  Orders Received/Actions take     None yet

## 2018-09-07 NOTE — Progress Notes (Signed)
I have seen and assessed patient and agree with Dr. Rosario Adie assessment and plan.  Patient is a 48 year old gentleman with history of metastatic prostate cancer to the spine status post chemotherapy last chemo 3 days prior to admission, depression, GERD seen at [facility] 1 week ago with confusion presented to the ED with worsening confusion, poor oral intake/failure to thrive, severe dehydration, hyponatremia, acute kidney injury and noted to have a UTI.  Urinalysis with triple phosphate crystals present concerning for struvite stones.  Patient also on examination noted to have some oral thrush and per fianc who is at bedside patient with some odynophagia.  Patient currently on IV Rocephin.  Change IV fluids to bicarb drip due to acidosis, significant hypernatremia.  Will place on nystatin swish and swallow, Magic mouthwash with lidocaine, IV Diflucan.  Oncology following.  Palliative care consulted.  No charge.

## 2018-09-08 DIAGNOSIS — G9341 Metabolic encephalopathy: Secondary | ICD-10-CM

## 2018-09-08 DIAGNOSIS — D701 Agranulocytosis secondary to cancer chemotherapy: Secondary | ICD-10-CM

## 2018-09-08 DIAGNOSIS — C689 Malignant neoplasm of urinary organ, unspecified: Secondary | ICD-10-CM

## 2018-09-08 DIAGNOSIS — R1310 Dysphagia, unspecified: Secondary | ICD-10-CM

## 2018-09-08 DIAGNOSIS — N390 Urinary tract infection, site not specified: Secondary | ICD-10-CM

## 2018-09-08 DIAGNOSIS — N3000 Acute cystitis without hematuria: Secondary | ICD-10-CM

## 2018-09-08 DIAGNOSIS — N179 Acute kidney failure, unspecified: Secondary | ICD-10-CM

## 2018-09-08 DIAGNOSIS — D6481 Anemia due to antineoplastic chemotherapy: Secondary | ICD-10-CM

## 2018-09-08 DIAGNOSIS — R627 Adult failure to thrive: Secondary | ICD-10-CM

## 2018-09-08 DIAGNOSIS — R7881 Bacteremia: Secondary | ICD-10-CM

## 2018-09-08 DIAGNOSIS — E876 Hypokalemia: Secondary | ICD-10-CM

## 2018-09-08 DIAGNOSIS — T451X5A Adverse effect of antineoplastic and immunosuppressive drugs, initial encounter: Secondary | ICD-10-CM | POA: Diagnosis present

## 2018-09-08 DIAGNOSIS — B370 Candidal stomatitis: Secondary | ICD-10-CM

## 2018-09-08 LAB — RENAL FUNCTION PANEL
Albumin: 1.9 g/dL — ABNORMAL LOW (ref 3.5–5.0)
Anion gap: 7 (ref 5–15)
BUN: 65 mg/dL — ABNORMAL HIGH (ref 6–20)
CO2: 23 mmol/L (ref 22–32)
Calcium: 8.6 mg/dL — ABNORMAL LOW (ref 8.9–10.3)
Chloride: 128 mmol/L — ABNORMAL HIGH (ref 98–111)
Creatinine, Ser: 1.21 mg/dL (ref 0.61–1.24)
GFR calc Af Amer: >60 mL/min (ref >60)
GFR calc non Af Amer: >60 mL/min (ref >60)
Glucose, Bld: 190 mg/dL — ABNORMAL HIGH (ref 70–99)
Phosphorus: 1.9 mg/dL — ABNORMAL LOW (ref 2.5–4.6)
Potassium: 2.7 mmol/L — CL (ref 3.5–5.1)
Sodium: 158 mmol/L — ABNORMAL HIGH (ref 135–145)

## 2018-09-08 LAB — CBC WITH DIFFERENTIAL/PLATELET
Abs Immature Granulocytes: 0.01 10*3/uL (ref 0.00–0.07)
Basophils Absolute: 0 10*3/uL (ref 0.0–0.1)
Basophils Relative: 0 %
Eosinophils Absolute: 0 10*3/uL (ref 0.0–0.5)
Eosinophils Relative: 1 %
HCT: 22 % — ABNORMAL LOW (ref 39.0–52.0)
Hemoglobin: 6.8 g/dL — CL (ref 13.0–17.0)
Immature Granulocytes: 1 %
Lymphocytes Relative: 5 %
Lymphs Abs: 0.1 10*3/uL — ABNORMAL LOW (ref 0.7–4.0)
MCH: 30.4 pg (ref 26.0–34.0)
MCHC: 30.9 g/dL (ref 30.0–36.0)
MCV: 98.2 fL (ref 80.0–100.0)
Monocytes Absolute: 0 10*3/uL — ABNORMAL LOW (ref 0.1–1.0)
Monocytes Relative: 1 %
Neutro Abs: 2 10*3/uL (ref 1.7–7.7)
Neutrophils Relative %: 92 %
Platelets: 205 10*3/uL (ref 150–400)
RBC: 2.24 MIL/uL — ABNORMAL LOW (ref 4.22–5.81)
RDW: 19.5 % — ABNORMAL HIGH (ref 11.5–15.5)
WBC: 2.1 10*3/uL — ABNORMAL LOW (ref 4.0–10.5)
nRBC: 0 % (ref 0.0–0.2)

## 2018-09-08 LAB — FERRITIN: Ferritin: 5329 ng/mL — ABNORMAL HIGH (ref 24–336)

## 2018-09-08 LAB — BLOOD CULTURE ID PANEL (REFLEXED)

## 2018-09-08 LAB — VITAMIN B12: Vitamin B-12: >7500 pg/mL — ABNORMAL HIGH (ref 180–914)

## 2018-09-08 LAB — IRON AND TIBC
Iron: 78 ug/dL (ref 45–182)
Saturation Ratios: 55 % — ABNORMAL HIGH (ref 17.9–39.5)
TIBC: 142 ug/dL — ABNORMAL LOW (ref 250–450)
UIBC: 64 ug/dL

## 2018-09-08 LAB — PREPARE RBC (CROSSMATCH)

## 2018-09-08 LAB — RETICULOCYTES
Immature Retic Fract: 2.7 % (ref 2.3–15.9)
RBC.: 2.24 MIL/uL — ABNORMAL LOW (ref 4.22–5.81)
Retic Count, Absolute: 28 10*3/uL (ref 19.0–186.0)
Retic Ct Pct: 1.3 % (ref 0.4–3.1)

## 2018-09-08 LAB — MAGNESIUM: Magnesium: 2.4 mg/dL (ref 1.7–2.4)

## 2018-09-08 LAB — FOLATE: Folate: 22.5 ng/mL (ref >5.9)

## 2018-09-08 LAB — HIV ANTIBODY (ROUTINE TESTING W REFLEX): HIV Screen 4th Generation wRfx: NONREACTIVE

## 2018-09-08 MED ORDER — POTASSIUM PHOSPHATES 15 MMOLE/5ML IV SOLN
30.0000 mmol | Freq: Once | INTRAVENOUS | Status: AC
  Administered 2018-09-09: 01:00:00 30 mmol via INTRAVENOUS
  Filled 2018-09-08: qty 10

## 2018-09-08 MED ORDER — POTASSIUM CHLORIDE 10 MEQ/100ML IV SOLN
10.0000 meq | INTRAVENOUS | Status: AC
  Administered 2018-09-08 (×5): 10 meq via INTRAVENOUS
  Filled 2018-09-08 (×4): qty 100

## 2018-09-08 MED ORDER — POTASSIUM CHLORIDE 10 MEQ/100ML IV SOLN
INTRAVENOUS | Status: AC
  Administered 2018-09-08: 12:00:00 10 meq via INTRAVENOUS
  Filled 2018-09-08: qty 100

## 2018-09-08 MED ORDER — SODIUM CHLORIDE 0.9% IV SOLUTION
Freq: Once | INTRAVENOUS | Status: AC
  Administered 2018-09-08: 16:00:00 via INTRAVENOUS

## 2018-09-08 MED ORDER — DIPHENHYDRAMINE HCL 50 MG/ML IJ SOLN
12.5000 mg | Freq: Once | INTRAMUSCULAR | Status: AC
  Administered 2018-09-08: 12.5 mg via INTRAVENOUS
  Filled 2018-09-08: qty 1

## 2018-09-08 MED ORDER — POTASSIUM CHLORIDE CRYS ER 20 MEQ PO TBCR
40.0000 meq | EXTENDED_RELEASE_TABLET | Freq: Two times a day (BID) | ORAL | Status: DC
  Filled 2018-09-08: qty 2

## 2018-09-08 MED ORDER — ALUM & MAG HYDROXIDE-SIMETH 200-200-20 MG/5ML PO SUSP: 30.0000 mL | ORAL | Status: DC | PRN

## 2018-09-08 MED ORDER — DEXTROSE 5 % IV SOLN
INTRAVENOUS | Status: DC
  Administered 2018-09-09 – 2018-09-10 (×6): via INTRAVENOUS

## 2018-09-08 MED ORDER — ORAL CARE MOUTH RINSE
15.0000 mL | Freq: Two times a day (BID) | OROMUCOSAL | Status: DC
  Administered 2018-09-08 – 2018-09-12 (×8): 15 mL via OROMUCOSAL

## 2018-09-08 MED ORDER — PANTOPRAZOLE SODIUM 40 MG IV SOLR
40.0000 mg | INTRAVENOUS | Status: DC
  Administered 2018-09-08 – 2018-09-13 (×6): 40 mg via INTRAVENOUS
  Filled 2018-09-08 (×6): qty 40

## 2018-09-08 MED ORDER — CHLORHEXIDINE GLUCONATE 0.12 % MT SOLN
15.0000 mL | Freq: Two times a day (BID) | OROMUCOSAL | Status: DC
  Administered 2018-09-08 – 2018-09-13 (×11): 15 mL via OROMUCOSAL
  Filled 2018-09-08 (×11): qty 15

## 2018-09-08 MED ORDER — ACETAMINOPHEN 325 MG PO TABS
650.0000 mg | ORAL_TABLET | Freq: Once | ORAL | Status: AC
  Administered 2018-09-08: 16:00:00 650 mg via ORAL
  Filled 2018-09-08: qty 2

## 2018-09-08 MED ORDER — SODIUM CHLORIDE 0.9% IV SOLUTION
Freq: Once | INTRAVENOUS | Status: AC
  Administered 2018-09-08: 22:00:00 via INTRAVENOUS

## 2018-09-08 NOTE — Progress Notes (Signed)
Rodney Rodney [name]   DOB:[birth year]   [id number]   [id number]  Subjective:  Rodney Rodney [name] is well-oriented today (knows he's in the hospital, trump is president, year is 2020). He says: "I know I'm not doing well." He wonders if there is any alternative to the treatment he is receiving. He tells me he does not want o "just lie down and die." He thinks maybe at [facility] they will have some options   Objective: middle aged White Rodney [name] in bed Vitals:   09/07/18 2100 09/08/18 0326  BP: 98/67 (!) 91/56  Pulse: (!) 118 (!) 112  Resp: 17 20  Temp: 97.6 F (36.4 C) 97.8 F (36.6 C)  SpO2: 100% 100%    Body mass index is 20.81 kg/m.  Intake/Output Summary (Last 24 hours) at 09/08/2018 0912 Last data filed at 09/08/2018 0400 Gross per 24 hour  Intake 2408.22 ml  Output 2200 ml  Net 208.22 ml      Lungs no rales or wheezes--auscultated anterolaterally  Heart regular rate and rhythm  Abdomen soft, +BS  Neuro A&O x3   CBG (last 3)  No results for input(s): GLUCAP in the last 72 hours.   Labs:  Lab Results  Component Value Date   WBC 2.1 (L) 09/08/2018   HGB 6.8 (LL) 09/08/2018   HCT 22.0 (L) 09/08/2018   MCV 98.2 09/08/2018   PLT 205 09/08/2018   NEUTROABS 2.0 09/08/2018    @LASTCHEMISTRY @  Urine Studies No results for input(s): UHGB, CRYS in the last 72 hours.  Invalid input(s): UACOL, UAPR, USPG, UPH, UTP, UGL, UKET, UBIL, UNIT, UROB, [place], UEPI, UWBC, Junie Panning [place], [place], [place]  Basic Metabolic Panel: Recent Labs  Lab 09/02/18 [phone] 09/03/18 0912 09/06/18 2201 09/07/18 0843 09/08/18 0332  NA 140 143 148* 154* 158*  K 5.1 4.3 4.9 3.8 2.7*  CL 112* 114* 127* >130* 128*  CO2 16* 16* 13* 10* 23  GLUCOSE 175* 144* 153* 176* 190*  BUN 51* 49* 98* 83* 65*  CREATININE 0.99 0.84 1.43* 1.29* 1.21  CALCIUM 10.1 10.3 10.0 9.4 8.6*  MG  --   --   --  2.6* 2.4  PHOS  --   --   --  3.5 1.9*   GFR Estimated Creatinine Clearance: 68 mL/min (by C-G formula based on SCr of  1.21 mg/dL). Liver Function Tests: Recent Labs  Lab 09/02/18 0922 09/03/18 0912 09/06/18 2201 09/07/18 0843 09/08/18 0332  AST 18 18 30   --   --   ALT 29 26 39  --   --   ALKPHOS 318* 323* 224*  --   --   BILITOT 0.6 0.7 0.9  --   --   PROT 7.0 7.1 7.1  --   --   ALBUMIN 2.6* 2.7* 2.9* 2.3* 1.9*   No results for input(s): LIPASE, AMYLASE in the last 168 hours. No results for input(s): AMMONIA in the last 168 hours. Coagulation profile Recent Labs  Lab 09/06/18 2201  INR 1.1    CBC: Recent Labs  Lab 09/02/18 0922 09/03/18 0912 09/06/18 2201 09/07/18 0843 09/08/18 0332  WBC 8.3 10.1 6.0 2.8* 2.1*  NEUTROABS 5.4 6.3 5.7 2.6 2.0  HGB 11.3* 11.2* 10.3* 8.4* 6.8*  HCT 35.1* 34.3* 34.5* 28.6* 22.0*  MCV 94.6 92.7 101.8* 102.1* 98.2  PLT 405* 438* 325 256 205   Cardiac Enzymes: No results for input(s): CKTOTAL, CKMB, CKMBINDEX, TROPONINI in the last 168 hours. BNP: Invalid input(s): POCBNP CBG: No results for input(s):  GLUCAP in the last 168 hours. D-Dimer No results for input(s): DDIMER in the last 72 hours. Hgb A1c No results for input(s): HGBA1C in the last 72 hours. Lipid Profile No results for input(s): CHOL, HDL, LDLCALC, TRIG, CHOLHDL, LDLDIRECT in the last 72 hours. Thyroid function studies No results for input(s): TSH, T4TOTAL, T3FREE, THYROIDAB in the last 72 hours.  Invalid input(s): FREET3 Anemia work up Recent Labs    09/08/18 0332  VITAMINB12 >7,500*  FOLATE 22.5  FERRITIN 5,329*  TIBC 142*  IRON 78  RETICCTPCT 1.3   Microbiology Recent Results (from the past 240 hour(s))  Urine culture     Status: Abnormal   Collection Time: 08/31/18  6:45 PM   Specimen: Urine, Catheterized  Result Value Ref Range Status   Specimen Description   Final    URINE, CATHETERIZED Performed at [facility], [address], [place], [place] [address]    Special Requests   Final    NONE Performed at [facility], [address] [place], [place] [address]     Culture (A)  Final    >=100,000 COLONIES/mL ESCHERICHIA COLI >=100,000 COLONIES/mL KLEBSIELLA PNEUMONIAE    Report Status 09/03/2018 FINAL  Final   Organism ID, Bacteria ESCHERICHIA COLI (A)  Final   Organism ID, Bacteria KLEBSIELLA PNEUMONIAE (A)  Final      Susceptibility   Escherichia coli - MIC*    AMPICILLIN <=2 SENSITIVE Sensitive     CEFAZOLIN <=4 SENSITIVE Sensitive     CEFTRIAXONE <=1 SENSITIVE Sensitive     CIPROFLOXACIN <=0.25 SENSITIVE Sensitive     GENTAMICIN <=1 SENSITIVE Sensitive     IMIPENEM <=0.25 SENSITIVE Sensitive     NITROFURANTOIN <=16 SENSITIVE Sensitive     TRIMETH/SULFA <=20 SENSITIVE Sensitive     AMPICILLIN/SULBACTAM <=2 SENSITIVE Sensitive     PIP/TAZO <=4 SENSITIVE Sensitive     Extended ESBL NEGATIVE Sensitive     * >=100,000 COLONIES/mL ESCHERICHIA COLI   Klebsiella pneumoniae - MIC*    AMPICILLIN >=32 RESISTANT Resistant     CEFAZOLIN <=4 SENSITIVE Sensitive     CEFTRIAXONE <=1 SENSITIVE Sensitive     CIPROFLOXACIN <=0.25 SENSITIVE Sensitive     GENTAMICIN <=1 SENSITIVE Sensitive     IMIPENEM <=0.25 SENSITIVE Sensitive     NITROFURANTOIN 64 INTERMEDIATE Intermediate     TRIMETH/SULFA <=20 SENSITIVE Sensitive     AMPICILLIN/SULBACTAM 4 SENSITIVE Sensitive     PIP/TAZO <=4 SENSITIVE Sensitive     Extended ESBL NEGATIVE Sensitive     * >=100,000 COLONIES/mL KLEBSIELLA PNEUMONIAE  Culture, blood (Routine x 2)     Status: Abnormal (Preliminary result)   Collection Time: 09/06/18 10:01 PM   Specimen: BLOOD  Result Value Ref Range Status   Specimen Description   Final    BLOOD PORTA CATH Performed at [facility], [place] [address], [place], [place] [address]    Special Requests   Final    BOTTLES DRAWN AEROBIC AND ANAEROBIC Blood Culture adequate volume Performed at [facility] [address] [place], [place] [address]    Culture  Setup Time   Final    GRAM NEGATIVE RODS IN BOTH AEROBIC AND ANAEROBIC  BOTTLES Organism ID to follow CRITICAL RESULT CALLED TO, READ BACK BY AND VERIFIED WITH: Otila Back [id number] Performed at [facility], [place] [address], [place],  [address]    Culture KLEBSIELLA PNEUMONIAE (A)  Final   Report Status PENDING  Incomplete  Blood  Culture ID Panel (Reflexed)     Status: Abnormal   Collection Time: 09/06/18 10:01 PM  Result Value Ref Range Status   Enterococcus species NOT DETECTED NOT DETECTED Final   Listeria monocytogenes NOT DETECTED NOT DETECTED Final   Staphylococcus species NOT DETECTED NOT DETECTED Final   Staphylococcus aureus (BCID) NOT DETECTED NOT DETECTED Final   Streptococcus species NOT DETECTED NOT DETECTED Final   Streptococcus agalactiae NOT DETECTED NOT DETECTED Final   Streptococcus pneumoniae NOT DETECTED NOT DETECTED Final   Streptococcus pyogenes NOT DETECTED NOT DETECTED Final   Acinetobacter baumannii NOT DETECTED NOT DETECTED Final   Enterobacteriaceae species DETECTED (A) NOT DETECTED Final    Comment: Enterobacteriaceae represent a large family of gram-negative bacteria, not a single organism. CRITICAL RESULT CALLED TO, READ BACK BY AND VERIFIED WITH: PHARMD J LEGGE [id number] MLM    Enterobacter cloacae complex NOT DETECTED NOT DETECTED Final   Escherichia coli NOT DETECTED NOT DETECTED Final   Klebsiella oxytoca NOT DETECTED NOT DETECTED Final   Klebsiella pneumoniae DETECTED (A) NOT DETECTED Final    Comment: CRITICAL RESULT CALLED TO, READ BACK BY AND VERIFIED WITH: PHARMD J LEGGE [id number] MLM    Proteus species NOT DETECTED NOT DETECTED Final   Serratia marcescens NOT DETECTED NOT DETECTED Final   Carbapenem resistance NOT DETECTED NOT DETECTED Final   Haemophilus influenzae NOT DETECTED NOT DETECTED Final   Neisseria meningitidis NOT DETECTED NOT DETECTED Final   Pseudomonas aeruginosa NOT DETECTED NOT DETECTED Final   Candida albicans NOT DETECTED NOT DETECTED Final   Candida glabrata NOT  DETECTED NOT DETECTED Final   Candida krusei NOT DETECTED NOT DETECTED Final   Candida parapsilosis NOT DETECTED NOT DETECTED Final   Candida tropicalis NOT DETECTED NOT DETECTED Final    Comment: Performed at [facility], [place], [place], [place] [address]  Culture, blood (Routine x 2)     Status: None (Preliminary result)   Collection Time: 09/06/18 10:06 PM   Specimen: BLOOD  Result Value Ref Range Status   Specimen Description   Final    BLOOD RIGHT ANTECUBITAL Performed at [facility] [address] [place], [place] [address]    Special Requests   Final    BOTTLES DRAWN AEROBIC AND ANAEROBIC Blood Culture adequate volume Performed at [facility] [address], [place], [place] [address]    Culture  Setup Time   Final    AEROBIC BOTTLE ONLY GRAM POSITIVE COCCI ANAEROBIC BOTTLE ONLY GRAM NEGATIVE RODS Organism ID to follow CRITICAL RESULT CALLED TO, READ BACK BY AND VERIFIED WITH: [facility] @ 0527 ON 09/08/18 BY ROBINSON Z.     Culture   Final    NO GROWTH 1 DAY Performed at [facility], [place] [address], [place], [place] [address]    Report Status PENDING  Incomplete  Blood Culture ID Panel (Reflexed)     Status: Abnormal   Collection Time: 09/06/18 10:06 PM  Result Value Ref Range Status   Enterococcus species NOT DETECTED NOT DETECTED Final   Listeria monocytogenes NOT DETECTED NOT DETECTED Final   Staphylococcus species DETECTED (A) NOT DETECTED Final    Comment: Methicillin (oxacillin) susceptible coagulase negative staphylococcus. Possible blood culture contaminant (unless isolated from more than one blood culture draw or clinical case suggests pathogenicity). No antibiotic treatment is indicated for blood  culture contaminants. CRITICAL RESULT CALLED TO, READ BACK BY AND VERIFIED WITH: [place] @  [id number] ON 09/08/18 BY ROBINSON Z.    Staphylococcus aureus (BCID) NOT DETECTED NOT DETECTED Final    Methicillin resistance NOT DETECTED NOT DETECTED Final   Streptococcus species NOT DETECTED NOT DETECTED Final   Streptococcus agalactiae NOT DETECTED NOT DETECTED Final   Streptococcus pneumoniae NOT DETECTED NOT DETECTED Final   Streptococcus pyogenes NOT DETECTED NOT DETECTED Final   Acinetobacter baumannii NOT DETECTED NOT DETECTED Final   Enterobacteriaceae species NOT DETECTED NOT DETECTED Final   Enterobacter cloacae complex NOT DETECTED NOT DETECTED Final   Escherichia coli NOT DETECTED NOT DETECTED Final   Klebsiella oxytoca NOT DETECTED NOT DETECTED Final   Klebsiella pneumoniae NOT DETECTED NOT DETECTED Final   Proteus species NOT DETECTED NOT DETECTED Final   Serratia marcescens NOT DETECTED NOT DETECTED Final   Haemophilus influenzae NOT DETECTED NOT DETECTED Final   Neisseria meningitidis NOT DETECTED NOT DETECTED Final   Pseudomonas aeruginosa NOT DETECTED NOT DETECTED Final   Candida albicans NOT DETECTED NOT DETECTED Final   Candida glabrata NOT DETECTED NOT DETECTED Final   Candida krusei NOT DETECTED NOT DETECTED Final   Candida parapsilosis NOT DETECTED NOT DETECTED Final   Candida tropicalis NOT DETECTED NOT DETECTED Final    Comment: Performed at [facility], [place] [address], [place], [place] [address]  SARS Coronavirus 2 [facility] order, Performed in [facility] hospital lab) Nasopharyngeal Nasopharyngeal Swab     Status: None   Collection Time: 09/07/18  3:48 AM   Specimen: Nasopharyngeal Swab  Result Value Ref Range Status   SARS Coronavirus 2 NEGATIVE NEGATIVE Final    Comment: (NOTE) If result is NEGATIVE SARS-CoV-2 target nucleic acids are NOT DETECTED. The SARS-CoV-2 RNA is generally detectable in upper and lower  respiratory specimens during the acute phase of infection. The lowest  concentration of SARS-CoV-2 viral copies this assay can detect is 250  copies / mL. A negative result does not preclude SARS-CoV-2 infection  and should not be used  as the sole basis for treatment or other  patient management decisions.  A negative result may occur with  improper specimen collection / handling, submission of specimen other  than nasopharyngeal swab, presence of viral mutation(s) within the  areas targeted by this assay, and inadequate number of viral copies  (<250 copies / mL). A negative result must be combined with clinical  observations, patient history, and epidemiological information. If result is POSITIVE SARS-CoV-2 target nucleic acids are DETECTED. The SARS-CoV-2 RNA is generally detectable in upper and lower  respiratory specimens dur ing the acute phase of infection.  Positive  results are indicative of active infection with SARS-CoV-2.  Clinical  correlation with patient history and other diagnostic information is  necessary to determine patient infection status.  Positive results do  not rule out bacterial infection or co-infection with other viruses. If result is PRESUMPTIVE POSTIVE SARS-CoV-2 nucleic acids MAY BE PRESENT.   A presumptive positive result was obtained on the submitted specimen  and confirmed on repeat testing.  While 2019 novel coronavirus  (SARS-CoV-2) nucleic acids may be present in the submitted sample  additional confirmatory testing may be necessary for epidemiological  and / or clinical management purposes  to differentiate between  SARS-CoV-2 and other Sarbecovirus currently known to infect humans.  If clinically indicated additional testing with an alternate test  methodology [phone]) is advised. The SARS-CoV-2 RNA is generally  detectable in upper and lower respiratory sp ecimens during the acute  phase of infection. The expected  result is Negative. Fact Sheet for Patients:  StrictlyIdeas.no Fact Sheet for Healthcare Providers: BankingDealers.co.za This test is not yet approved or cleared by the [place] FDA and has been authorized for  detection and/or diagnosis of SARS-CoV-2 by FDA under an Emergency Use Authorization (EUA).  This EUA will remain in effect (meaning this test can be used) for the duration of the COVID-19 declaration under Section 564(b)(1) of the Act, 21 U.S.C. section 360bbb-3(b)(1), unless the authorization is terminated or revoked sooner. Performed at [facility], [place] [address], [place], [place] [address]   Urine culture     Status: Abnormal (Preliminary result)   Collection Time: 09/07/18  3:50 AM   Specimen: Urine, Random  Result Value Ref Range Status   Specimen Description   Final    URINE, RANDOM Performed at [facility] [address], [place], [place] [address]    Special Requests   Final    NONE Performed at [facility], [place] [address], [place], [place] [address]    Culture (A)  Final    >=100,000 COLONIES/mL GRAM NEGATIVE RODS CULTURE REINCUBATED FOR BETTER GROWTH Performed at [facility], [place] [address], [place], [place] [address]    Report Status PENDING  Incomplete      Studies:  Dg Chest 2 View  Result Date: 09/06/2018 CLINICAL DATA:  Suspected sepsis. EXAM: CHEST - 2 VIEW COMPARISON:  Radiographs and CT 08/02/2018 FINDINGS: Low lung volumes. Right chest port remains in place with tip in the SVC. Normal heart size. Unchanged mediastinal contours. Streaky atelectasis in both lung bases. No pulmonary edema, large pleural effusion or pneumothorax. Numerous osseous lesions including the ribs, spine, and sternum, better appreciated on prior CT. IMPRESSION: Low lung volumes with bibasilar atelectasis. Osseous metastatic disease as characterized on prior CT. Electronically Signed   By: Keith Rake M.D.   On: 09/06/2018 22:34   Ct Head Wo Contrast  Result Date: 09/07/2018 CLINICAL DATA:  Altered mental status (AMS), unclear cause. History of metastatic bladder/prostate cancer. EXAM: CT HEAD WITHOUT CONTRAST  TECHNIQUE: Contiguous axial images were obtained from the base of the skull through the vertex without intravenous contrast. COMPARISON:  Head CT 03/06/2010 FINDINGS: Brain: No evidence of acute infarction, hemorrhage, hydrocephalus, extra-axial collection or mass lesion/mass effect. Generalized atrophy which is age advanced. Periventricular white matter changes most prominent in the bifrontal region, progressed from 2012 CT. Vascular: No hyperdense vessel. Skull: Arachnoid granulations in the left frontal bone, unchanged from remote CT. No fracture or focal lesion. Sinuses/Orbits: Paranasal sinuses and mastoid air cells are clear. The visualized orbits are unremarkable. Other: None. IMPRESSION: 1. No acute intracranial abnormality. 2. Generalized atrophy, prominent for age. Periventricular white matter changes nonspecific, most typically chronic small vessel ischemia, age advanced and progressed from 2012. Electronically Signed   By: Keith Rake M.D.   On: 09/07/2018 00:53    Assessment: 48 y.o. 48 y.o. Rodney Rodney [name], Rodney Rodney [name] with.  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   2. Prostate cancerdiagnosed in July 2019. Hepresented with a Gleason score of 6 and a PSA 5.58 and currently disease-free after prostatectomy.  Prior Therapy:  He is status post a robotic assisted laparoscopic radical cystoprostatectomy and bilateral pelvic lymphadenectomy and neobladder creation completed on November 08, 2017 by Dr. Alinda Money. 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 Prostate adenocarcinoma is 3+4 =  7 with 0 out of 14 lymph nodes noted.  He is status post radiation to the cervical and thoracic spine for a total of 30 Gy in 10 fractions completed on August 06, 2018.  Current therapy: Palliative chemotherapy utilizing cisplatin and gemcitabine with cycle 1 started on 08/13/2018.    With poor tolerance of cycle 1, treatment changed to carboplatin/ gemcitabine, first dose 09/03/2018   Plan:  Rodney Rodney [name] is now day 6 cycle 2 chemotherapy. ANC 2.0 but likely to drop next few days. Clinically he is improved.  He is aware that his situation is difficult. He is thinking about a 2d opinion. I told him it is never a problem to get a 2d opinion and that Dr [name] can facilitate that, but that Dr [name] actually is an expert in the type of cancer he has and I do not think he is going to get very different advice elsewhere. He is interested in getting "everybody together" (daughter Nira Conn, Roanna Epley and Dr [name]) and possibly that can happen by phone sometime this admission. The question is whether to proceed with further chemo or move to best supportive care under Hospice.  Appreciate the input from Palliative Care in working through [place] with patient and family.  Please let me know if I can be of further help at this time  Chauncey Cruel, MD 09/08/2018  9:12 AM Medical Oncology and Hematology [facility] [address] [place], [place] [address] Tel. [phone]    Fax. [phone]

## 2018-09-08 NOTE — Progress Notes (Signed)
PROGRESS NOTE    Rodney [name]  [id number] DOB: [birth year] DOA: 09/06/2018 PCP: Curlene Labrum, MD    Brief Narrative:  HPI per Dr. Si Raider is a 48 y.o. 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  He has been awake and alert and communicating at the time.  Since then he started declining.  After his chemotherapy on Tuesday he has progressively worsened even more.  Not eating or drinking adequately.  In the last 24 hours he has been unresponsive.  Wife brought patient to the ER where he was evaluated.  He was found to have UTI, severe dehydration and acute kidney injury.  Patient is currently not able to give any history so history is obtained from the wife who is at bedside.  He has had some fever at home.  Patient remains a full code.  Is being admitted with altered mental status as well as significant UTI..  ED Course: Temperature 97.6 blood pressure 89/72, pulse 123 with respirate 22 and oxygen sat 100% on room air.  White count is 6.0 hemoglobin 10.3 platelets 325.  Sodium 148 potassium 4.9 chloride 127 CO2 13 BUN 98 creatinine 1.43 and calcium 10.0.  Urinalysis showed moderate leukocytes.  Very turbid urine.  Many bacteria.  WBC 21-50.  Triple phosphate crystals present.  Head CT without contrast showed no acute findings.  Showed bibasilar atelectasis.  Patient is being admitted for further treatment and initiated on antibiotics.   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 coli and Klebsiella UTI Urine cultures preliminary results with greater than 100,000 colonies of E. coli and Klebsiella pneumonia.  Sepsis abilities pending.  Patient improving clinically however not at baseline.  Increase IV Rocephin to 2 g daily as patient now with a Klebsiella bacteremia.  IV fluids.  Supportive care.  2.  Klebsiella pneumonia bacteremia Likely seeded from the urine.  Patient immunocompromised with recent chemotherapy.  Increase IV sites Rocephin to 2 g daily.  Supportive care.  3.  Acute metabolic encephalopathy Likely multifactorial secondary to problems #1 and 2, dehydration, metastatic disease.  Patient improving clinically and slowly however not at baseline.  Continue empiric IV Rocephin, IV fluids, supportive care.  4.  Odynophagia/oral thrush/??  Mucositis On admission patient noted to have concerns for oral thrush.  Patient with complaints of odynophagia.  Concern for possible mucositis and Candida esophagitis.  Patient on nystatin swish and swallow as well as Magic mouthwash with lidocaine.  Continue IV Diflucan.  Follow.  5.  Chemotherapy-induced anemia and neutropenia Patient noted to be anemic hemoglobin dropping to 6.8 from 10.3 on admission.  Partly likely dilutional.  Check  an anemia panel.  Patient with no overt bleeding.  Patient with recent chemotherapy 3 days prior to admission.  Patient has been typed and crossed and will transfuse 2 units packed red blood cells.  Follow H&H.  May require Neupogen if ANC is less than 1.  Oncology following.  6.  Hypovolemic hypernatremia Likely secondary to significant dehydration/volume depletion  secondary to odynophagia, failure to thrive.  Discontinue bicarb and placed on D5W.  Give a bolus of IV fluids.  Patient to receive packed red blood cells transfusion.  Follow.  7.  Hypokalemia/hypophosphatemia Magnesium at 2.4.  Patient receiving IV potassium runs.  Place potassium and IV fluids.  K-Phos 30 mmol IV x1.  Follow.  8.  Borderline blood pressure Likely multifactorial secondary to bacteremia and UTI and volume depletion.  Hydrate with IV fluids.  Continue empiric IV antibiotics.  Follow.  9.  Acute kidney injury Likely secondary to a prerenal azotemia in the setting of significant dehydration with poor oral intake.  Renal function improving with hydration.  Continue IV fluids.  Follow.  10.  prostate cancer/stage IV bladder cancer Patient diagnosed with bladder cancer in June 2019 subsequently developed stage IV bladder cancer with involvement with lung bone and liver documented in July 2020.  Patient diagnosed with prostate cancer in July 2019 with a Gleason score of 6 and a PSA of 5.58 status post prostatectomy.  Patient receiving palliative chemotherapy.  Oncology has been consulted and are following.  Per oncology.  11.  Failure to thrive Secondary to problem #10.  Palliative care consulted and are following.   DVT prophylaxis: SCDs Code Status: Full Family Communication: Updated patient and daughter at bedside. Disposition Plan: To be determined.   Consultants:   Oncology: Dr. Jana Hakim 09/07/2018  Palliative care: Dr. Rowe Pavy 09/07/2018  Procedures:   Transfusion 2 units packed red blood cells 09/08/2018  CT head 09/07/2018  Chest x-ray 09/06/2018    Antimicrobials:   IV Rocephin 09/07/2018   Subjective: Patient laying in bed.  Patient's seems to have improved with his mental status.  Patient is alert and oriented.  Occasional bouts of confusion.  Denies any chest pain.  Denies any shortness of breath.  Initially refused transfusion of packed red blood cells  however has changed his mind.  Patient denies any overt bleeding.  Objective: Vitals:   09/07/18 2100 09/08/18 0326 09/08/18 1347 09/08/18 1756  BP: 98/67 (!) 91/56 102/67 113/81  Pulse: (!) 118 (!) 112 (!) 109 99  Resp: 17 20 20 18   Temp: 97.6 F (36.4 C) 97.8 F (36.6 C) 97.7 F (36.5 C) (!) 97.5 F (36.4 C)  TempSrc: Oral Oral Oral Oral  SpO2: 100% 100% 100%   Height:        Intake/Output Summary (Last 24 hours) at 09/08/2018 2005 Last data filed at 09/08/2018 1756 Gross per 24 hour  Intake 2973.22 ml  Output 1750 ml  Net 1223.22 ml   There were no vitals filed for this visit.  Examination:  General exam: Appears calm and comfortable.  Frail.  Cachectic. Respiratory system: Clear to auscultation. Respiratory effort normal. Cardiovascular system: S1 & S2 heard, RRR. No JVD, murmurs, rubs, gallops or clicks. No pedal edema. Gastrointestinal system: Abdomen is nondistended, soft and nontender. No organomegaly or masses felt. Normal bowel sounds heard. Central nervous system: Alert and oriented. No focal neurological deficits. Extremities: Symmetric 5 x 5 power. Skin: No rashes, lesions or ulcers Psychiatry: Judgement and insight appear normal. Mood & affect appropriate.  Data Reviewed: I have personally reviewed following labs and imaging studies  CBC: Recent Labs  Lab 09/02/18 0922 09/03/18 0912 09/06/18 2201 09/07/18 0843 09/08/18 0332  WBC 8.3 10.1 6.0 2.8* 2.1*  NEUTROABS 5.4 6.3 5.7 2.6 2.0  HGB 11.3* 11.2* 10.3* 8.4* 6.8*  HCT 35.1* 34.3* 34.5* 28.6* 22.0*  MCV 94.6 92.7 101.8* 102.1* 98.2  PLT 405* 438* 325 256 [id number]   Basic Metabolic Panel: Recent Labs  Lab 09/02/18 0922 09/03/18 0912 09/06/18 2201 09/07/18 0843 09/08/18 0332  NA 140 143 148* 154* 158*  K 5.1 4.3 4.9 3.8 2.7*  CL 112* 114* 127* >130* 128*  CO2 16* 16* 13* 10* 23  GLUCOSE 175* 144* 153* 176* 190*  BUN 51* 49* 98* 83* 65*  CREATININE 0.99 0.84 1.43* 1.29* 1.21  CALCIUM 10.1  10.3 10.0 9.4 8.6*  MG  --   --   --  2.6* 2.4  PHOS  --   --   --  3.5 1.9*   GFR: Estimated Creatinine Clearance: 68 mL/min (by C-G formula based on SCr of 1.21 mg/dL). Liver Function Tests: Recent Labs  Lab 09/02/18 0922 09/03/18 0912 09/06/18 2201 09/07/18 0843 09/08/18 0332  AST 18 18 30   --   --   ALT 29 26 39  --   --   ALKPHOS 318* 323* 224*  --   --   BILITOT 0.6 0.7 0.9  --   --   PROT 7.0 7.1 7.1  --   --   ALBUMIN 2.6* 2.7* 2.9* 2.3* 1.9*   No results for input(s): LIPASE, AMYLASE in the last 168 hours. No results for input(s): AMMONIA in the last 168 hours. Coagulation Profile: Recent Labs  Lab 09/06/18 2201  INR 1.1   Cardiac Enzymes: No results for input(s): CKTOTAL, CKMB, CKMBINDEX, TROPONINI in the last 168 hours. BNP (last 3 results) No results for input(s): PROBNP in the last 8760 hours. HbA1C: No results for input(s): HGBA1C in the last 72 hours. CBG: No results for input(s): GLUCAP in the last 168 hours. Lipid Profile: No results for input(s): CHOL, HDL, LDLCALC, TRIG, CHOLHDL, LDLDIRECT in the last 72 hours. Thyroid Function Tests: No results for input(s): TSH, T4TOTAL, FREET4, T3FREE, THYROIDAB in the last 72 hours. Anemia Panel: Recent Labs    09/08/18 0332  VITAMINB12 >7,500*  FOLATE 22.5  FERRITIN 5,329*  TIBC 142*  IRON 78  RETICCTPCT 1.3   Sepsis Labs: Recent Labs  Lab 09/06/18 2201 09/07/18 0008  LATICACIDVEN 1.3 0.9    Recent Results (from the past 240 hour(s))  Urine culture     Status: Abnormal   Collection Time: 08/31/18  6:45 PM   Specimen: Urine, Catheterized  Result Value Ref Range Status   Specimen Description   Final    URINE, CATHETERIZED Performed at [facility], [address], [place], [place] [address]    Special Requests   Final    NONE Performed at [facility], [address], [place], [place] [address]    Culture (A)  Final    >=100,000 COLONIES/mL ESCHERICHIA COLI >=100,000 COLONIES/mL KLEBSIELLA  PNEUMONIAE    Report Status 09/03/2018 FINAL  Final   Organism ID, Bacteria ESCHERICHIA COLI (A)  Final   Organism ID, Bacteria KLEBSIELLA PNEUMONIAE (A)  Final      Susceptibility   Escherichia coli - MIC*    AMPICILLIN <=2 SENSITIVE Sensitive     CEFAZOLIN <=4 SENSITIVE Sensitive     CEFTRIAXONE <=1 SENSITIVE Sensitive  CIPROFLOXACIN <=0.25 SENSITIVE Sensitive     GENTAMICIN <=1 SENSITIVE Sensitive     IMIPENEM <=0.25 SENSITIVE Sensitive     NITROFURANTOIN <=16 SENSITIVE Sensitive     TRIMETH/SULFA <=20 SENSITIVE Sensitive     AMPICILLIN/SULBACTAM <=2 SENSITIVE Sensitive     PIP/TAZO <=4 SENSITIVE Sensitive     Extended ESBL NEGATIVE Sensitive     * >=100,000 COLONIES/mL ESCHERICHIA COLI   Klebsiella pneumoniae - MIC*    AMPICILLIN >=32 RESISTANT Resistant     CEFAZOLIN <=4 SENSITIVE Sensitive     CEFTRIAXONE <=1 SENSITIVE Sensitive     CIPROFLOXACIN <=0.25 SENSITIVE Sensitive     GENTAMICIN <=1 SENSITIVE Sensitive     IMIPENEM <=0.25 SENSITIVE Sensitive     NITROFURANTOIN 64 INTERMEDIATE Intermediate     TRIMETH/SULFA <=20 SENSITIVE Sensitive     AMPICILLIN/SULBACTAM 4 SENSITIVE Sensitive     PIP/TAZO <=4 SENSITIVE Sensitive     Extended ESBL NEGATIVE Sensitive     * >=100,000 COLONIES/mL KLEBSIELLA PNEUMONIAE  Culture, blood (Routine x 2)     Status: Abnormal (Preliminary result)   Collection Time: 09/06/18 10:01 PM   Specimen: BLOOD  Result Value Ref Range Status   Specimen Description   Final    BLOOD PORTA CATH Performed at [facility] [address] [place], [place] [address]    Special Requests   Final    BOTTLES DRAWN AEROBIC AND ANAEROBIC Blood Culture adequate volume Performed at [facility] [address] [place], [place] [address]    Culture  Setup Time   Final    GRAM NEGATIVE RODS IN BOTH AEROBIC AND ANAEROBIC BOTTLES Organism ID to follow CRITICAL RESULT CALLED TO, READ BACK BY AND VERIFIED WITH: Otila Back [id number] MLM Performed at [facility], [place] [address] [place], [place] [address]    Culture KLEBSIELLA PNEUMONIAE (A)  Final   Report Status PENDING  Incomplete  Blood Culture ID Panel (Reflexed)     Status: Abnormal   Collection Time: 09/06/18 10:01 PM  Result Value Ref Range Status   Enterococcus species NOT DETECTED NOT DETECTED Final   Listeria monocytogenes NOT DETECTED NOT DETECTED Final   Staphylococcus species NOT DETECTED NOT DETECTED Final   Staphylococcus aureus (BCID) NOT DETECTED NOT DETECTED Final   Streptococcus species NOT DETECTED NOT DETECTED Final   Streptococcus agalactiae NOT DETECTED NOT DETECTED Final   Streptococcus pneumoniae NOT DETECTED NOT DETECTED Final   Streptococcus pyogenes NOT DETECTED NOT DETECTED Final   Acinetobacter baumannii NOT DETECTED NOT DETECTED Final   Enterobacteriaceae species DETECTED (A) NOT DETECTED Final    Comment: Enterobacteriaceae represent a large family of gram-negative bacteria, not a single organism. CRITICAL RESULT CALLED TO, READ BACK BY AND VERIFIED WITH: PHARMD J LEGGE [id number] MLM    Enterobacter cloacae complex NOT DETECTED NOT DETECTED Final   Escherichia coli NOT DETECTED NOT DETECTED Final   Klebsiella oxytoca NOT DETECTED NOT DETECTED Final   Klebsiella pneumoniae DETECTED (A) NOT DETECTED Final    Comment: CRITICAL RESULT CALLED TO, READ BACK BY AND VERIFIED WITH: PHARMD J LEGGE [id number] MLM    Proteus species NOT DETECTED NOT DETECTED Final   Serratia marcescens NOT DETECTED NOT DETECTED Final   Carbapenem resistance NOT DETECTED NOT DETECTED Final   Haemophilus influenzae NOT DETECTED NOT DETECTED Final   Neisseria meningitidis NOT DETECTED NOT DETECTED Final   Pseudomonas aeruginosa NOT DETECTED NOT DETECTED Final   Candida albicans NOT DETECTED  NOT DETECTED Final   Candida glabrata NOT DETECTED NOT DETECTED Final   Candida krusei NOT DETECTED NOT DETECTED Final   Candida parapsilosis  NOT DETECTED NOT DETECTED Final   Candida tropicalis NOT DETECTED NOT DETECTED Final    Comment: Performed at [facility], [place] [address], [place], [place] [address]  Culture, blood (Routine x 2)     Status: None (Preliminary result)   Collection Time: 09/06/18 10:06 PM   Specimen: BLOOD  Result Value Ref Range Status   Specimen Description   Final    BLOOD RIGHT ANTECUBITAL Performed at [facility] [address], [place], [place] [address]    Special Requests   Final    BOTTLES DRAWN AEROBIC AND ANAEROBIC Blood Culture adequate volume Performed at [facility] [address], [place], [place] [address]    Culture  Setup Time   Final    AEROBIC BOTTLE ONLY GRAM POSITIVE COCCI ANAEROBIC BOTTLE ONLY GRAM NEGATIVE RODS Organism ID to follow CRITICAL RESULT CALLED TO, READ BACK BY AND VERIFIED WITH: [facility] @ 0527 ON 09/08/18 BY ROBINSON Z.     Culture   Final    NO GROWTH 1 DAY Performed at [facility], [place] [address] [place], [place] [address]    Report Status PENDING  Incomplete  Blood Culture ID Panel (Reflexed)     Status: Abnormal   Collection Time: 09/06/18 10:06 PM  Result Value Ref Range Status   Enterococcus species NOT DETECTED NOT DETECTED Final   Listeria monocytogenes NOT DETECTED NOT DETECTED Final   Staphylococcus species DETECTED (A) NOT DETECTED Final    Comment: Methicillin (oxacillin) susceptible coagulase negative staphylococcus. Possible blood culture contaminant (unless isolated from more than one blood culture draw or clinical case suggests pathogenicity). No antibiotic treatment is indicated for blood  culture contaminants. CRITICAL RESULT CALLED TO, READ BACK BY AND VERIFIED WITH: [facility] @ 0527 ON 09/08/18 BY ROBINSON Z.    Staphylococcus aureus (BCID) NOT DETECTED NOT DETECTED Final   Methicillin resistance NOT DETECTED NOT DETECTED Final   Streptococcus species NOT DETECTED NOT  DETECTED Final   Streptococcus agalactiae NOT DETECTED NOT DETECTED Final   Streptococcus pneumoniae NOT DETECTED NOT DETECTED Final   Streptococcus pyogenes NOT DETECTED NOT DETECTED Final   Acinetobacter baumannii NOT DETECTED NOT DETECTED Final   Enterobacteriaceae species NOT DETECTED NOT DETECTED Final   Enterobacter cloacae complex NOT DETECTED NOT DETECTED Final   Escherichia coli NOT DETECTED NOT DETECTED Final   Klebsiella oxytoca NOT DETECTED NOT DETECTED Final   Klebsiella pneumoniae NOT DETECTED NOT DETECTED Final   Proteus species NOT DETECTED NOT DETECTED Final   Serratia marcescens NOT DETECTED NOT DETECTED Final   Haemophilus influenzae NOT DETECTED NOT DETECTED Final   Neisseria meningitidis NOT DETECTED NOT DETECTED Final   Pseudomonas aeruginosa NOT DETECTED NOT DETECTED Final   Candida albicans NOT DETECTED NOT DETECTED Final   Candida glabrata NOT DETECTED NOT DETECTED Final   Candida krusei NOT DETECTED NOT DETECTED Final   Candida parapsilosis NOT DETECTED NOT DETECTED Final   Candida tropicalis NOT DETECTED NOT DETECTED Final    Comment: Performed at [facility], [place] [address], [place],  [address]  SARS Coronavirus 2 [facility] order, Performed in [facility] hospital lab) Nasopharyngeal Nasopharyngeal Swab     Status: None   Collection Time: 09/07/18  3:48 AM   Specimen: Nasopharyngeal Swab  Result Value Ref Range  Status   SARS Coronavirus 2 NEGATIVE NEGATIVE Final    Comment: (NOTE) If result is NEGATIVE SARS-CoV-2 target nucleic acids are NOT DETECTED. The SARS-CoV-2 RNA is generally detectable in upper and lower  respiratory specimens during the acute phase of infection. The lowest  concentration of SARS-CoV-2 viral copies this assay can detect is 250  copies / mL. A negative result does not preclude SARS-CoV-2 infection  and should not be used as the sole basis for treatment or other  patient management decisions.  A negative result may  occur with  improper specimen collection / handling, submission of specimen other  than nasopharyngeal swab, presence of viral mutation(s) within the  areas targeted by this assay, and inadequate number of viral copies  (<250 copies / mL). A negative result must be combined with clinical  observations, patient history, and epidemiological information. If result is POSITIVE SARS-CoV-2 target nucleic acids are DETECTED. The SARS-CoV-2 RNA is generally detectable in upper and lower  respiratory specimens dur ing the acute phase of infection.  Positive  results are indicative of active infection with SARS-CoV-2.  Clinical  correlation with patient history and other diagnostic information is  necessary to determine patient infection status.  Positive results do  not rule out bacterial infection or co-infection with other viruses. If result is PRESUMPTIVE POSTIVE SARS-CoV-2 nucleic acids MAY BE PRESENT.   A presumptive positive result was obtained on the submitted specimen  and confirmed on repeat testing.  While 2019 novel coronavirus  (SARS-CoV-2) nucleic acids may be present in the submitted sample  additional confirmatory testing may be necessary for epidemiological  and / or clinical management purposes  to differentiate between  SARS-CoV-2 and other Sarbecovirus currently known to infect humans.  If clinically indicated additional testing with an alternate test  methodology [phone]) is advised. The SARS-CoV-2 RNA is generally  detectable in upper and lower respiratory sp ecimens during the acute  phase of infection. The expected result is Negative. Fact Sheet for Patients:  StrictlyIdeas.no Fact Sheet for Healthcare Providers: BankingDealers.co.za This test is not yet approved or cleared by the [place] FDA and has been authorized for detection and/or diagnosis of SARS-CoV-2 by FDA under an Emergency Use Authorization (EUA).  This  EUA will remain in effect (meaning this test can be used) for the duration of the COVID-19 declaration under Section 564(b)(1) of the Act, 21 U.S.C. section 360bbb-3(b)(1), unless the authorization is terminated or revoked sooner. Performed at [facility], [place] [address] [place], [place] [address]   Urine culture     Status: Abnormal (Preliminary result)   Collection Time: 09/07/18  3:50 AM   Specimen: Urine, Random  Result Value Ref Range Status   Specimen Description   Final    URINE, RANDOM Performed at [facility] [address], [place], [place] [address]    Special Requests   Final    NONE Performed at [facility], [place] [address], [place], [place] [address]    Culture (A)  Final    >=100,000 COLONIES/mL ESCHERICHIA COLI >=100,000 COLONIES/mL KLEBSIELLA PNEUMONIAE SUSCEPTIBILITIES TO FOLLOW Performed at [facility] [address], [place], [place] [address]    Report Status PENDING  Incomplete         Radiology Studies: Dg Chest 2 View  Result Date: 09/06/2018 CLINICAL DATA:  Suspected sepsis. EXAM: CHEST - 2 VIEW COMPARISON:  Radiographs and CT 08/02/2018 FINDINGS: Low lung volumes. Right chest port remains in  place with tip in the SVC. Normal heart size. Unchanged mediastinal contours. Streaky atelectasis in both lung bases. No pulmonary edema, large pleural effusion or pneumothorax. Numerous osseous lesions including the ribs, spine, and sternum, better appreciated on prior CT. IMPRESSION: Low lung volumes with bibasilar atelectasis. Osseous metastatic disease as characterized on prior CT. Electronically Signed   By: Keith Rake M.D.   On: 09/06/2018 22:34   Ct Head Wo Contrast  Result Date: 09/07/2018 CLINICAL DATA:  Altered mental status (AMS), unclear cause. History of metastatic bladder/prostate cancer. EXAM: CT HEAD WITHOUT CONTRAST TECHNIQUE: Contiguous axial images were obtained from the  base of the skull through the vertex without intravenous contrast. COMPARISON:  Head CT 03/06/2010 FINDINGS: Brain: No evidence of acute infarction, hemorrhage, hydrocephalus, extra-axial collection or mass lesion/mass effect. Generalized atrophy which is age advanced. Periventricular white matter changes most prominent in the bifrontal region, progressed from 2012 CT. Vascular: No hyperdense vessel. Skull: Arachnoid granulations in the left frontal bone, unchanged from remote CT. No fracture or focal lesion. Sinuses/Orbits: Paranasal sinuses and mastoid air cells are clear. The visualized orbits are unremarkable. Other: None. IMPRESSION: 1. No acute intracranial abnormality. 2. Generalized atrophy, prominent for age. Periventricular white matter changes nonspecific, most typically chronic small vessel ischemia, age advanced and progressed from 2012. Electronically Signed   By: Keith Rake M.D.   On: 09/07/2018 00:53        Scheduled Meds: . sodium chloride   Intravenous Once  . chlorhexidine  15 mL Mouth Rinse BID  . cyanocobalamin  1,000 mcg Intramuscular Q30 days  . [START ON 09/09/2018] dexamethasone  2 mg Oral Daily  . dronabinol  2.5 mg Oral BID AC  . DULoxetine  30 mg Oral Daily  . enoxaparin (LOVENOX) injection  40 mg Subcutaneous Daily  . [START ON 09/09/2018] fentaNYL  1 patch Transdermal Q72H  . magic mouthwash w/lidocaine  15 mL Oral QID  . mouth rinse  15 mL Mouth Rinse q12n4p  . megestrol  400 mg Oral BID  . multivitamin with minerals  1 tablet Oral Daily  . nystatin  5 mL Oral QID  . pantoprazole (PROTONIX) IV  40 mg Intravenous Q24H  . polyethylene glycol  17 g Oral BID  . senna-docusate  2 tablet Oral BID   Continuous Infusions: . cefTRIAXone (ROCEPHIN)  IV 2 g (09/08/18 1340)  . dextrose    . fluconazole (DIFLUCAN) IV 100 mg (09/08/18 1441)  . potassium PHOSPHATE IVPB (in mmol)       LOS: 1 day    Time spent: 45 minutes    Irine Seal, MD [facility]  If 7PM-7AM, please contact night-coverage [email or web address] 09/08/2018, 8:05 PM

## 2018-09-08 NOTE — Progress Notes (Signed)
CRITICAL VALUE ALERT  Critical Value:  Potassium 2.7  Date & Time Notied:  08/23  Provider Notified: Kennon Holter  Orders Received/Actions taken: orders pending

## 2018-09-08 NOTE — Progress Notes (Signed)
PHARMACY - PHYSICIAN COMMUNICATION CRITICAL VALUE ALERT - BLOOD CULTURE IDENTIFICATION (BCID)  Rodney [name] is an 48 y.o. male who presented to [facility] on 09/06/2018 with a chief complaint of metastatic prostate Ca  Assessment:   (include suspected source if known) 3 of 4 Kleb pneumo - on rocephin 1 of 4 CoNS MecA+- likely contaminant  Name of physician (or Provider) Contacted: Jeannette Corpus, NP  Current antibiotics: Rocephin 2 Gm IV q24h  Changes to prescribed antibiotics recommended:  Patient is on recommended antibiotics - No changes needed  Results for orders placed or performed during the hospital encounter of 09/06/18  Blood Culture ID Panel (Reflexed) (Collected: 09/06/2018 10:06 PM)  Result Value Ref Range   Enterococcus species NOT DETECTED NOT DETECTED   Listeria monocytogenes NOT DETECTED NOT DETECTED   Staphylococcus species DETECTED (A) NOT DETECTED   Staphylococcus aureus (BCID) NOT DETECTED NOT DETECTED   Methicillin resistance NOT DETECTED NOT DETECTED   Streptococcus species NOT DETECTED NOT DETECTED   Streptococcus agalactiae NOT DETECTED NOT DETECTED   Streptococcus pneumoniae NOT DETECTED NOT DETECTED   Streptococcus pyogenes NOT DETECTED NOT DETECTED   Acinetobacter baumannii NOT DETECTED NOT DETECTED   Enterobacteriaceae species NOT DETECTED NOT DETECTED   Enterobacter cloacae complex NOT DETECTED NOT DETECTED   Escherichia coli NOT DETECTED NOT DETECTED   Klebsiella oxytoca NOT DETECTED NOT DETECTED   Klebsiella pneumoniae NOT DETECTED NOT DETECTED   Proteus species NOT DETECTED NOT DETECTED   Serratia marcescens NOT DETECTED NOT DETECTED   Haemophilus influenzae NOT DETECTED NOT DETECTED   Neisseria meningitidis NOT DETECTED NOT DETECTED   Pseudomonas aeruginosa NOT DETECTED NOT DETECTED   Candida albicans NOT DETECTED NOT DETECTED   Candida glabrata NOT DETECTED NOT DETECTED   Candida krusei NOT DETECTED NOT DETECTED   Candida parapsilosis NOT  DETECTED NOT DETECTED   Candida tropicalis NOT DETECTED NOT DETECTED    Dorrene German 09/08/2018  5:31 AM

## 2018-09-08 NOTE — Progress Notes (Signed)
CRITICAL VALUE ALERT  Critical Value:  HGB 6.8  Date & Time Notied:  0412 on 8/23  Provider Notified: [place]  Orders Received/Actions taken: orders pending

## 2018-09-08 NOTE — Progress Notes (Signed)
OT Cancellation Note  Patient Details Name: Rodney [name] MRN: [id number] DOB: 1970/06/24   Cancelled Treatment:    Reason Eval/Treat Not Completed: Medical issues which prohibited therapy; pt with low hgb, spoke with RN who reports pt to receive blood this afternoon, request to hold OT at this time. Will follow up as schedule permits.  Lou Cal, OT [facility] Pager [phone] Office [phone]   [name] 09/08/2018, 2:43 PM

## 2018-09-09 ENCOUNTER — Other Ambulatory Visit: Payer: Self-pay

## 2018-09-09 DIAGNOSIS — Z515 Encounter for palliative care: Secondary | ICD-10-CM

## 2018-09-09 LAB — CBC WITH DIFFERENTIAL/PLATELET
Abs Immature Granulocytes: 0.06 10*3/uL (ref 0.00–0.07)
Basophils Absolute: 0 10*3/uL (ref 0.0–0.1)
Basophils Relative: 0 %
Eosinophils Absolute: 0 10*3/uL (ref 0.0–0.5)
Eosinophils Relative: 0 %
HCT: 28 % — ABNORMAL LOW (ref 39.0–52.0)
Hemoglobin: 8.9 g/dL — ABNORMAL LOW (ref 13.0–17.0)
Immature Granulocytes: 2 %
Lymphocytes Relative: 4 %
Lymphs Abs: 0.1 10*3/uL — ABNORMAL LOW (ref 0.7–4.0)
MCH: 31.1 pg (ref 26.0–34.0)
MCHC: 31.8 g/dL (ref 30.0–36.0)
MCV: 97.9 fL (ref 80.0–100.0)
Monocytes Absolute: 0 10*3/uL — ABNORMAL LOW (ref 0.1–1.0)
Monocytes Relative: 1 %
Neutro Abs: 2.7 10*3/uL (ref 1.7–7.7)
Neutrophils Relative %: 93 %
Platelets: 146 10*3/uL — ABNORMAL LOW (ref 150–400)
RBC: 2.86 MIL/uL — ABNORMAL LOW (ref 4.22–5.81)
RDW: 17.3 % — ABNORMAL HIGH (ref 11.5–15.5)
WBC: 2.9 10*3/uL — ABNORMAL LOW (ref 4.0–10.5)
nRBC: 0 % (ref 0.0–0.2)

## 2018-09-09 LAB — BPAM RBC
Blood Product Expiration Date: 202009192359
Blood Product Expiration Date: 202009192359
ISSUE DATE / TIME: 202008231545
ISSUE DATE / TIME: 202008232216
Unit Type and Rh: 5100
Unit Type and Rh: 5100

## 2018-09-09 LAB — BASIC METABOLIC PANEL
Anion gap: 9 (ref 5–15)
Anion gap: 9 (ref 5–15)
BUN: 48 mg/dL — ABNORMAL HIGH (ref 6–20)
BUN: 36 mg/dL — ABNORMAL HIGH (ref 6–20)
CO2: 23 mmol/L (ref 22–32)
CO2: 20 mmol/L — ABNORMAL LOW (ref 22–32)
Calcium: 9 mg/dL (ref 8.9–10.3)
Calcium: 8.6 mg/dL — ABNORMAL LOW (ref 8.9–10.3)
Chloride: 129 mmol/L — ABNORMAL HIGH (ref 98–111)
Chloride: 124 mmol/L — ABNORMAL HIGH (ref 98–111)
Creatinine, Ser: 1.09 mg/dL (ref 0.61–1.24)
Creatinine, Ser: 0.94 mg/dL (ref 0.61–1.24)
GFR calc Af Amer: >60 mL/min (ref >60)
GFR calc Af Amer: >60 mL/min (ref >60)
GFR calc non Af Amer: >60 mL/min (ref >60)
GFR calc non Af Amer: >60 mL/min (ref >60)
Glucose, Bld: 113 mg/dL — ABNORMAL HIGH (ref 70–99)
Glucose, Bld: 143 mg/dL — ABNORMAL HIGH (ref 70–99)
Potassium: 3.6 mmol/L (ref 3.5–5.1)
Potassium: 3.7 mmol/L (ref 3.5–5.1)
Sodium: 161 mmol/L (ref 135–145)
Sodium: 153 mmol/L — ABNORMAL HIGH (ref 135–145)

## 2018-09-09 LAB — TYPE AND SCREEN
ABO/RH(D): O POS
Antibody Screen: NEGATIVE
Unit division: 0
Unit division: 0

## 2018-09-09 LAB — RENAL FUNCTION PANEL
Albumin: 2 g/dL — ABNORMAL LOW (ref 3.5–5.0)
Anion gap: 8 (ref 5–15)
BUN: 49 mg/dL — ABNORMAL HIGH (ref 6–20)
CO2: 24 mmol/L (ref 22–32)
Calcium: 8.9 mg/dL (ref 8.9–10.3)
Chloride: 129 mmol/L — ABNORMAL HIGH (ref 98–111)
Creatinine, Ser: 1.1 mg/dL (ref 0.61–1.24)
GFR calc Af Amer: >60 mL/min (ref >60)
GFR calc non Af Amer: >60 mL/min (ref >60)
Glucose, Bld: 112 mg/dL — ABNORMAL HIGH (ref 70–99)
Phosphorus: 3.3 mg/dL (ref 2.5–4.6)
Potassium: 3.6 mmol/L (ref 3.5–5.1)
Sodium: 161 mmol/L (ref 135–145)

## 2018-09-09 LAB — COMPREHENSIVE METABOLIC PANEL
ALT: 30 U/L (ref 0–44)
AST: 37 U/L (ref 15–41)
Albumin: 2 g/dL — ABNORMAL LOW (ref 3.5–5.0)
Alkaline Phosphatase: 136 U/L — ABNORMAL HIGH (ref 38–126)
Anion gap: 8 (ref 5–15)
BUN: 43 mg/dL — ABNORMAL HIGH (ref 6–20)
CO2: 21 mmol/L — ABNORMAL LOW (ref 22–32)
Calcium: 8.6 mg/dL — ABNORMAL LOW (ref 8.9–10.3)
Chloride: 125 mmol/L — ABNORMAL HIGH (ref 98–111)
Creatinine, Ser: 1 mg/dL (ref 0.61–1.24)
GFR calc Af Amer: >60 mL/min (ref >60)
GFR calc non Af Amer: >60 mL/min (ref >60)
Glucose, Bld: 187 mg/dL — ABNORMAL HIGH (ref 70–99)
Potassium: 3.3 mmol/L — ABNORMAL LOW (ref 3.5–5.1)
Sodium: 154 mmol/L — ABNORMAL HIGH (ref 135–145)
Total Bilirubin: 0.4 mg/dL (ref 0.3–1.2)
Total Protein: 5.4 g/dL — ABNORMAL LOW (ref 6.5–8.1)

## 2018-09-09 MED ORDER — DEXTROSE 5 % IV BOLUS
1000.0000 mL | Freq: Once | INTRAVENOUS | Status: AC
  Administered 2018-09-09: 06:00:00 1000 mL via INTRAVENOUS

## 2018-09-09 MED ORDER — SORBITOL 70 % SOLN: 30.0000 mL | Freq: Once | Status: DC

## 2018-09-09 MED ORDER — POTASSIUM CHLORIDE 10 MEQ/100ML IV SOLN
10.0000 meq | INTRAVENOUS | Status: AC
  Administered 2018-09-09 (×4): 10 meq via INTRAVENOUS
  Filled 2018-09-09 (×4): qty 100

## 2018-09-09 MED ORDER — ENSURE ENLIVE PO LIQD
237.0000 mL | Freq: Two times a day (BID) | ORAL | Status: DC
  Administered 2018-09-10 – 2018-09-13 (×5): 237 mL via ORAL

## 2018-09-09 NOTE — Progress Notes (Signed)
PMT progress note  Rodney [name] is awake and alert resting in bed.  He saw his oncologist earlier this morning.  [name] is also present at the bedside.  Patient states that his pain is much better controlled, he feels good.  He is awaiting physical therapy evaluation at the time of my visit.   BP (!) 118/91 (BP Location: Left Arm)   Pulse 89   Temp 98 F (36.7 C) (Oral)   Resp 18   Ht 5\' 10"  (1.778 m)   Wt 65.8 kg   SpO2 100%   BMI 20.82 kg/m  Labs and imaging noted.  Chart reviewed.  Awake alert oriented sitting up in bed Has a Port-A-Cath, appears with generalized weakness S1-S2 Clear Abdomen is not distended No edema Nonfocal  Palliative performance scale 40%  48 year old gentleman with advanced bladder cancer, metastatic disease to bone.  Admitted for functional decline, generalized weakness, altered mental status dehydration and sepsis secondary to UTI.  Palliative medicine team consulted for pain and non-pain symptom management and goals of care discussions.  Patient and daughter are thankful to medical oncology for clarification of next steps.  Patient is to resume next cycle of chemotherapy scheduled for 09-25-2018.   We discussed about his current pain regimen.  Continue the same.  Disposition options.  We discussed about home with home-based health care, home-based physical therapy.  Patient recently had home PT OT after discharge from the hospital the last time.  25 minutes spent.  All of the patient's and daughters questions addressed to the best of my ability.  Anticipate discharge soon with outpatient oncology follow-up.  Greater than 50%  of this time was spent counseling and coordinating care related to the above assessment and plan.   Loistine Chance MD [place] palliative team [phone]

## 2018-09-09 NOTE — Progress Notes (Signed)
IP PROGRESS NOTE  Subjective:   Rodney [name] is known to me with history of advanced bladder cancer and metastatic disease to the bone.  He was hospitalized for altered mental status, dehydration and urosepsis.  He is currently receiving intravenous antibiotics as well as aggressive IV hydration.  Clinically he reports improvement in his mentation and had multiple bowel movements after chronic constipation.  He does not report any back pain shoulder pain.  He denies any shortness of breath or difficulty breathing.  He has felt better in the last 24 hours.  Objective:  Vital signs in last 24 hours: Temp:  [97.5 F (36.4 C)-98.4 F (36.9 C)] 98 F (36.7 C) (08/24 0450) Pulse Rate:  [89-109] 89 (08/24 0450) Resp:  [16-20] 18 (08/24 0450) BP: (102-120)/(67-91) 118/91 (08/24 0450) SpO2:  [98 %-100 %] 100 % (08/24 0450) Weight:  [145 lb 1.6 oz (65.8 kg)] 145 lb 1.6 oz (65.8 kg) (08/24 0450) Weight change:  Last BM Date: 09/08/18  Intake/Output from previous day: 08/23 0701 - 08/24 0700 In: 2387.1 [P.O.:240; I.V.:527.2; Blood:1020; IV Piggyback:599.9] Out: [id number] [Urine:1550] General: Alert, awake without distress. Head: Normocephalic atraumatic. Mouth: mucous membranes moist, pharynx normal without lesions Eyes: No scleral icterus.  Pupils are equal and round reactive to light. Resp: clear to auscultation bilaterally without rhonchi or wheezes or dullness to percussion. Cardio: regular rate and rhythm, S1, S2 normal, no murmur, click, rub or gallop GI: soft, non-tender; bowel sounds normal; no masses,  no organomegaly Musculoskeletal: No joint deformity or effusion. Neurological: No motor, sensory deficits.  Intact deep tendon reflexes. Skin: No rashes or lesions.  Portacath without erythema  Lab Results: Recent Labs    09/08/18 0332 09/09/18 0424  WBC 2.1* 2.9*  HGB 6.8* 8.9*  HCT 22.0* 28.0*  PLT 205 146*    BMET Recent Labs    09/08/18 0332 09/09/18 0424  NA 158* 161*   161*  K 2.7* 3.6  3.6  CL 128* 129*  129*  CO2 23 23  24   GLUCOSE 190* 113*  112*  BUN 65* 48*  49*  CREATININE 1.21 1.09  1.10  CALCIUM 8.6* 9.0  8.9       Medications: I have reviewed the patient's current medications.  Assessment/Plan:  48 year old with:  1.  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  He is currently receiving palliative chemotherapy with gemcitabine and carboplatin.  Risks and benefits of continuing systemic chemotherapy versus switching to palliative approach and transitioning to hospice care was discussed.  The natural course of his disease was outlined today in detail with him and his family via phone.  They understand that the goal of chemotherapy is palliative at best and his prognosis is poor regardless.  Chemotherapy might offer small palliation at this time.  After discussion he would like to continue to try at least for the time being but we will start the next cycle of chemotherapy is scheduled on 09/25/2018 and will hold chemotherapy for this week.  Chemotherapy can be discontinued at any time and proceed with hospice if he desires to do so.  I also reiterated that if he becomes more debilitated chemotherapy cannot be given moving forward.  2.  Poor p.o. intake and dehydration: We have discussed strategies to improve his hydration including intravenous hydration in between chemotherapy treatments.  Appetite stimulants as well as nutritional supplements will be needed.  3.  Cytopenias: Related to chemotherapy and appears to be improving.  His hemoglobin is  8.9 after transfusion.  4.  Klebsiella bacteremia: He is currently receiving intravenous antibiotics.  5.  Disposition: I have no objections to discharge once medically ready by the primary team.  He has follow-up at the cancer center upon his discharge.  We will continue to follow with you during his hospitalization.  30  minutes was spent with the patient face-to-face  today.  More than 50% of time was dedicated to reviewing his disease status, treatment options, prognosis and future plan of care.   LOS: 2 days   Zola Button 09/09/2018, 7:52 AM

## 2018-09-09 NOTE — Evaluation (Signed)
Physical Therapy Evaluation Patient Details Name: Rodney [name] MRN: [id number] DOB: 12-17-1970 Today's Date: 09/09/2018   History of Present Illness  Rodney [name] is a 48 y.o. 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  After his last chemotherapy he progressively became more confused and unresponsive.  He was found to have UTI, severe dehydration and acute kidney injury.  Clinical Impression  Pt admitted with above diagnosis. Pt currently with functional limitations due to the deficits listed below (see PT Problem List). Pt will benefit from skilled PT to increase their independence and safety with mobility to allow discharge to the venue listed below.  Pt did not want to attempt standing during PT eval feeling too weak to try.  He was able to perform bed mobility with min/guard and heavy use of bed rail.  He performed a lateral scoot transfer to recliner (with arm rest down) with MIN/guard. He was unable to fully clear buttocks off chair for re-positioning.  If family can manage pt at home, then recommend home with 24/7 S, w/c and hospital bed.  If family cannot assist then may need to recommend SNF. Noted in MD notes that hospice may be a possible option for pt as well.     Follow Up Recommendations Home health PT;Supervision/Assistance - 24 hour;SNF    Equipment Recommendations  Wheelchair (measurements PT);Wheelchair cushion (measurements PT);Hospital bed    Recommendations for Other Services       Precautions / Restrictions Precautions Precautions: Fall Restrictions Weight Bearing Restrictions: No      Mobility  Bed Mobility Overal bed mobility: Needs Assistance Bed Mobility: Supine to Sit     Supine to sit: Min guard;HOB elevated     General bed mobility comments: Pt able to get to EOB with min/guard with cues for hand placement using  rails.  Transfers Overall transfer level: Needs assistance   Transfers: Lateral/Scoot Transfers          Lateral/Scoot Transfers: Min guard General transfer comment: Pt did not feel strong enough to attempt stand and declined sit > stand training.  Performed a lateral scoot transfer to the R with min/guard and some difficulty and increased time.  He was quite fatigued with transfer.  Ambulation/Gait             General Gait Details: deferred  Stairs            Wheelchair Mobility    Modified Rankin (Stroke Patients Only)       Balance Overall balance assessment: History of Falls;Needs assistance Sitting-balance support: Feet supported Sitting balance-Leahy Scale: Fair       Standing balance-Leahy Scale: Zero                               Pertinent Vitals/Pain Pain Assessment: 0-10 Pain Score: 7  Pain Location: L thoracic Pain Descriptors / Indicators: Constant Pain Intervention(s): Limited activity within patient's tolerance;Monitored during session;Repositioned    Home Living Family/patient expects to be discharged to:: Private residence Living Arrangements: Spouse/significant other Available Help at Discharge: Family;Available 24 hours/day Type of Home: House Home Access: Ramped entrance     Home Layout: One level Home Equipment: Walker - 2 wheels;Bedside commode;Walker - 4 wheels      Prior Function Level of Independence: Needs assistance   Gait / Transfers Assistance Needed: Amb with RW until 1 week  ago when he became too weak. Family has been assisting him with transfers since then.           Hand Dominance        Extremity/Trunk Assessment   Upper Extremity Assessment Upper Extremity Assessment: Defer to OT evaluation    Lower Extremity Assessment Lower Extremity Assessment: Generalized weakness;LLE deficits/detail;RLE deficits/detail RLE Deficits / Details: 3+/5 LLE Deficits / Details: Pt did not want MMT on L  LE LLE: Unable to fully assess due to pain       Communication   Communication: No difficulties  Cognition Arousal/Alertness: Awake/alert Behavior During Therapy: WFL for tasks assessed/performed Overall Cognitive Status: Impaired/Different from baseline Area of Impairment: Problem solving                             Problem Solving: Slow processing;Requires verbal cues General Comments: Pt oriented, but at times shows some slight confusion.      General Comments General comments (skin integrity, edema, etc.): Pt very sensitive and does  not want much touching him. Wants to try and perform activities on his own.    Exercises     Assessment/Plan    PT Assessment Patient needs continued PT services  PT Problem List Decreased strength;Decreased activity tolerance;Decreased balance;Decreased mobility;Decreased coordination;Decreased knowledge of use of DME;Decreased safety awareness       PT Treatment Interventions DME instruction;Gait training;Therapeutic activities;Therapeutic exercise;Functional mobility training;Neuromuscular re-education;Patient/family education    PT Goals (Current goals can be found in the Care Plan section)  Acute Rehab PT Goals Patient Stated Goal: get stronger PT Goal Formulation: With patient/family Time For Goal Achievement: 09/23/18 Potential to Achieve Goals: Fair    Frequency Min 3X/week   Barriers to discharge        Co-evaluation               AM-PAC PT "6 Clicks" Mobility  Outcome Measure Help needed turning from your back to your side while in a flat bed without using bedrails?: A Lot Help needed moving from lying on your back to sitting on the side of a flat bed without using bedrails?: A Lot Help needed moving to and from a bed to a chair (including a wheelchair)?: A Little Help needed standing up from a chair using your arms (e.g., wheelchair or bedside chair)?: A Lot Help needed to walk in hospital room?:  Total Help needed climbing 3-5 steps with a railing? : Total 6 Click Score: 11    End of Session   Activity Tolerance: Patient limited by fatigue Patient left: in chair;with call bell/phone within reach;with chair alarm set;with family/visitor present Nurse Communication: Mobility status PT Visit Diagnosis: Unsteadiness on feet (R26.81);Other abnormalities of gait and mobility (R26.89);Muscle weakness (generalized) (M62.81);Adult, failure to thrive (R62.7)    Time: 1019(no charge for time MD was in the room)-1102 PT Time Calculation (min) (ACUTE ONLY): 43 min   Charges:   PT Evaluation $PT Eval Moderate Complexity: 1 Mod PT Treatments $Therapeutic Activity: 8-22 mins        Marzell Allemand L. Tamala Julian, [place] Pager [phone] 09/09/2018   Galen Manila 09/09/2018, 11:21 AM

## 2018-09-09 NOTE — Progress Notes (Signed)
Initial Nutrition Assessment  RD working remotely.   DOCUMENTATION CODES:   (unable to assess for malnutrition at this time.)  INTERVENTION:  - will order Ensure Enlive BID, each supplement provides 350 kcal and 20 grams of protein. - continue to encourage PO intakes.    NUTRITION DIAGNOSIS:   Increased nutrient needs related to chronic illness, cancer and cancer related treatments as evidenced by estimated needs.  GOAL:   Patient will meet greater than or equal to 90% of their needs  MONITOR:   PO intake, Supplement acceptance, Labs, Weight trends  REASON FOR ASSESSMENT:   Malnutrition Screening Tool  ASSESSMENT:   48 y.o. 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 He was seen at [facility] a week ago d/t confusion. He was not eating or drinking well PTA and in the 24 hours PTA, he was unresponsive. In the ED he was found to have a UTI, severe dehydration, and AKI. Head CT without contrast showed no acute findings, showed bibasilar atelectasis.  Per flow sheet, patient consumed 50% of lunch on 8/22; 0% of breakfast and lunch on 8/23; 25% of breakfast and 0% of lunch today. Unable to contact patient this afternoon.   Per chart review, current weight is 145 lb and weight on 7/22 was 170 lb. This indicates 25 lb weight loss (15% body weight) in the past 1 month; question accuracy of this. Although suspect some degree of malnutrition d/t medication history and odynophagia.  Per notes: -Palliative Care following for pain management and GOC--will remain full code - plan for next cycle of chemo to start on 9/9 - UTI - bacteremia - acute metabolic encephalopathy  - odynophagia with oral thrush, mucositis vs esophagitis concern - chemo-induced anemia and neutropenia - hypovolemic hypernatremia - hypokalemia and hypophosphatemia--resolved - prostate cancer/stage 4 bladder cancer -  FTT   Labs reviewed; Na: 153 mmol/l, Cl: 124 mmol/l, BUN: 36 mg/dl, Ca: 8.6 mg/dl. Medications reviewed; 2.5 mg marinol BID, 400 mg megace BID, daily multivitamin with minerals, 5 ml mycostatin QID, 40 mg IV protonix/day, 1 packet miralax BID, 10 mEq IV KCl x4 runs 8/24, 30 mmol IV KPhos x1 run 8/23, 2 tablets senokot BID.  IVF; D5 @ 125 ml/hr (510 kcal).   NUTRITION - FOCUSED PHYSICAL EXAM:  unable to complete at this time.   Diet Order:   Diet Order            Diet regular Room service appropriate? Yes; Fluid consistency: Thin  Diet effective now              EDUCATION NEEDS:   No education needs have been identified at this time  Skin:  Skin Assessment: Reviewed RN Assessment  Last BM:  8/24  Height:   Ht Readings from Last 1 Encounters:  09/07/18 5\' 10"  (1.778 m)    Weight:   Wt Readings from Last 1 Encounters:  09/09/18 65.8 kg    Ideal Body Weight:  75.4 kg  BMI:  Body mass index is 20.82 kg/m.  Estimated Nutritional Needs:   Kcal:  2100-2300 kcal  Protein:  100-110 grams  Fluid:  >/= 2.1 L/day     Jarome Matin, MS, RD, LDN, [facility] Inpatient Clinical Dietitian Pager # [phone] After hours/weekend pager # [phone]

## 2018-09-09 NOTE — Progress Notes (Signed)
PROGRESS NOTE    Rodney [name]  [id number] DOB: 1970/05/13 DOA: 09/06/2018 PCP: Curlene Labrum, MD    Brief Narrative:  HPI per Dr. Si Raider is a 48 y.o. 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  He has been awake and alert and communicating at the time.  Since then he started declining.  After his chemotherapy on Tuesday he has progressively worsened even more.  Not eating or drinking adequately.  In the last 24 hours he has been unresponsive.  Wife brought patient to the ER where he was evaluated.  He was found to have UTI, severe dehydration and acute kidney injury.  Patient is currently not able to give any history so history is obtained from the wife who is at bedside.  He has had some fever at home.  Patient remains a full code.  Is being admitted with altered mental status as well as significant UTI..  ED Course: Temperature 97.6 blood pressure 89/72, pulse 123 with respirate 22 and oxygen sat 100% on room air.  White count is 6.0 hemoglobin 10.3 platelets 325.  Sodium 148 potassium 4.9 chloride 127 CO2 13 BUN 98 creatinine 1.43 and calcium 10.0.  Urinalysis showed moderate leukocytes.  Very turbid urine.  Many bacteria.  WBC 21-50.  Triple phosphate crystals present.  Head CT without contrast showed no acute findings.  Showed bibasilar atelectasis.  Patient is being admitted for further treatment and initiated on antibiotics.   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 coli and Klebsiella UTI Urine cultures preliminary results with greater than 100,000 colonies of E. coli and Klebsiella pneumonia.  Susceptibilities pending.  Patient improving clinically however not at baseline.  Continue IV Rocephin 2 g daily as patient now with a Klebsiella bacteremia. Continue IV fluids.  Supportive care.  2.  Klebsiella pneumoniae bacteremia Likely seeded from the urine.  Patient immunocompromised with recent chemotherapy.  Continue IV Rocephin 2 g daily.  Supportive care.  3.  Acute metabolic encephalopathy Likely multifactorial secondary to problems #1 and 2, dehydration, metastatic disease.  Patient improving daily.  Continue empiric IV Rocephin, IV fluids, supportive care.   4.  Odynophagia/oral thrush/??  Mucositis On admission patient noted to have concerns for oral thrush.  Patient with complaints of odynophagia.  Concern for possible mucositis and Candida esophagitis.  Patient on nystatin swish and swallow as well as Magic mouthwash with lidocaine.  Improving clinically.  Continue IV Diflucan.  Follow.  5.  Chemotherapy-induced anemia and neutropenia Patient noted to be anemic  hemoglobin dropping to 6.8 from 10.3 on admission.  Partly likely dilutional.  Patient status post 2 units of packed red blood cell transfusion on 09/08/2018.  Hemoglobin currently at 8.9.  Patient with no overt bleeding.  Patient with recent chemotherapy 3 days prior to admission. Follow H&H.  May require Neupogen if ANC is less than 1.  Oncology following.  6.  Hypovolemic  hypernatremia Likely secondary to significant dehydration/volume depletion secondary to odynophagia, failure to thrive.  Discontinued bicarb and placed on D5W.  Sodium level worsening at 161 this morning.  Patient received a bolus of D5W.  Repeat labs this morning with improvement and sodium level now at 154.  Continue D5W.  Follow.    7.  Hypokalemia/hypophosphatemia Magnesium at 2.4.  Patient status post IV rounds.  Potassium at 3.6 this morning.  Continue IV fluids.  Supportive care.  Repeat a phosphorus level in the morning.  Follow.  8.  Borderline blood pressure Likely multifactorial secondary to bacteremia and UTI and volume depletion.  IV fluid resuscitation.  Continue empiric IV Rocephin.  Follow.  9.  Acute kidney injury Likely secondary to a prerenal azotemia in the setting of significant dehydration with poor oral intake.  Renal function improving with hydration.  Continue IV fluids.  Follow.  10.  prostate cancer/stage IV bladder cancer with metastases Patient diagnosed with bladder cancer in June 2019 subsequently developed stage IV bladder cancer with involvement with lung bone and liver documented in July 2020.  Patient diagnosed with prostate cancer in July 2019 with a Gleason score of 6 and a PSA of 5.58 status post prostatectomy.  Patient receiving palliative chemotherapy.  Oncology has been consulted and are following.  Per oncology patient's next cycle of chemotherapy scheduled for 09/25/2018 and chemotherapy on hold for this week.  Per oncology.  11.  Failure to thrive/moderate protein calorie malnutrition Secondary to problem #10.  Palliative care consulted and are following.  Dietitian following.   DVT prophylaxis: SCDs Code Status: Full Family Communication: Updated patient and daughter at bedside. Disposition Plan: To be determined.   Consultants:   Oncology: Dr. Jana Hakim 09/07/2018  Palliative care: Dr. Rowe Pavy 09/07/2018  Procedures:   Transfusion 2 units packed  red blood cells 09/08/2018  CT head 09/07/2018  Chest x-ray 09/06/2018    Antimicrobials:   IV Rocephin 09/07/2018   Subjective: Patient laying in bed.  Mental status improving.  Feels much better.  Poor oral intake.  Denies any chest pain.  No shortness of breath.  No overt bleeding.   Objective: Vitals:   09/08/18 2207 09/08/18 2239 09/09/18 0051 09/09/18 0450  BP: 119/86 120/88 105/73 (!) 118/91  Pulse: 98 (!) 102 94 89  Resp: 16 16 16 18   Temp: 98.2 F (36.8 C) 98 F (36.7 C) 97.8 F (36.6 C) 98 F (36.7 C)  TempSrc: Oral Oral Oral Oral  SpO2: 98% 100% 99% 100%  Weight:    65.8 kg  Height:        Intake/Output Summary (Last 24 hours) at 09/09/2018 1313 Last data filed at 09/09/2018 1000 Gross per 24 hour  Intake 4680.99 ml  Output 1250 ml  Net 3430.99 ml   Filed Weights   09/09/18 0450  Weight: 65.8 kg    Examination:  General exam: Appears calm and comfortable.  Frail.  [place]. Respiratory system: CTAB.  No wheezes, no crackles, no rhonchi.  Respiratory effort normal. Cardiovascular system: Regular rate rhythm no murmurs rubs or gallops.  No JVD.  No lower  extremity edema.  Gastrointestinal system: Abdomen is soft, nontender, nondistended, positive bowel sounds.  No rebound.  No guarding.  Central nervous system: Alert and oriented. No focal neurological deficits. Extremities: Symmetric 5 x 5 power. Skin: No rashes, lesions or ulcers Psychiatry: Judgement and insight appear normal. Mood & affect appropriate.     Data Reviewed: I have personally reviewed following labs and imaging studies  CBC: Recent Labs  Lab 09/03/18 0912 09/06/18 2201 09/07/18 0843 09/08/18 0332 09/09/18 0424  WBC 10.1 6.0 2.8* 2.1* 2.9*  NEUTROABS 6.3 5.7 2.6 2.0 2.7  HGB 11.2* 10.3* 8.4* 6.8* 8.9*  HCT 34.3* 34.5* 28.6* 22.0* 28.0*  MCV 92.7 101.8* 102.1* 98.2 97.9  PLT 438* 325 256 205 [id number]*   Basic Metabolic Panel: Recent Labs  Lab 09/06/18 2201 09/07/18 0843  09/08/18 0332 09/09/18 0424 09/09/18 0926  NA 148* 154* 158* 161*  161* 154*  K 4.9 3.8 2.7* 3.6  3.6 3.3*  CL 127* >130* 128* 129*  129* 125*  CO2 13* 10* 23 23  24  21*  GLUCOSE 153* 176* 190* 113*  112* 187*  BUN 98* 83* 65* 48*  49* 43*  CREATININE 1.43* 1.29* 1.21 1.09  1.10 1.00  CALCIUM 10.0 9.4 8.6* 9.0  8.9 8.6*  MG  --  2.6* 2.4  --   --   PHOS  --  3.5 1.9* 3.3  --    GFR: Estimated Creatinine Clearance: 82.3 mL/min (by C-G formula based on SCr of 1 mg/dL). Liver Function Tests: Recent Labs  Lab 09/03/18 0912 09/06/18 2201 09/07/18 0843 09/08/18 0332 09/09/18 0424 09/09/18 0926  AST 18 30  --   --   --  37  ALT 26 39  --   --   --  30  ALKPHOS 323* 224*  --   --   --  136*  BILITOT 0.7 0.9  --   --   --  0.4  PROT 7.1 7.1  --   --   --  5.4*  ALBUMIN 2.7* 2.9* 2.3* 1.9* 2.0* 2.0*   No results for input(s): LIPASE, AMYLASE in the last 168 hours. No results for input(s): AMMONIA in the last 168 hours. Coagulation Profile: Recent Labs  Lab 09/06/18 2201  INR 1.1   Cardiac Enzymes: No results for input(s): CKTOTAL, CKMB, CKMBINDEX, TROPONINI in the last 168 hours. BNP (last 3 results) No results for input(s): PROBNP in the last 8760 hours. HbA1C: No results for input(s): HGBA1C in the last 72 hours. CBG: No results for input(s): GLUCAP in the last 168 hours. Lipid Profile: No results for input(s): CHOL, HDL, LDLCALC, TRIG, CHOLHDL, LDLDIRECT in the last 72 hours. Thyroid Function Tests: No results for input(s): TSH, T4TOTAL, FREET4, T3FREE, THYROIDAB in the last 72 hours. Anemia Panel: Recent Labs    09/08/18 0332  VITAMINB12 >7,500*  FOLATE 22.5  FERRITIN 5,329*  TIBC 142*  IRON 78  RETICCTPCT 1.3   Sepsis Labs: Recent Labs  Lab 09/06/18 2201 09/07/18 0008  LATICACIDVEN 1.3 0.9    Recent Results (from the past 240 hour(s))  Urine culture     Status: Abnormal   Collection Time: 08/31/18  6:45 PM   Specimen: Urine, Catheterized   Result Value Ref Range Status   Specimen Description   Final    URINE, CATHETERIZED Performed at [facility], [address], [place], [place] [address]    Special Requests   Final    NONE Performed at [facility], [place]  [address], [place], [place] [address]    Culture (A)  Final    >=100,000 COLONIES/mL ESCHERICHIA COLI >=100,000 COLONIES/mL KLEBSIELLA PNEUMONIAE    Report Status 09/03/2018 FINAL  Final   Organism ID, Bacteria ESCHERICHIA COLI (A)  Final   Organism ID, Bacteria KLEBSIELLA PNEUMONIAE (A)  Final      Susceptibility   Escherichia coli - MIC*    AMPICILLIN <=2 SENSITIVE Sensitive     CEFAZOLIN <=4 SENSITIVE Sensitive     CEFTRIAXONE <=1 SENSITIVE Sensitive     CIPROFLOXACIN <=0.25 SENSITIVE Sensitive     GENTAMICIN <=1 SENSITIVE Sensitive     IMIPENEM <=0.25 SENSITIVE Sensitive     NITROFURANTOIN <=16 SENSITIVE Sensitive     TRIMETH/SULFA <=20 SENSITIVE Sensitive     AMPICILLIN/SULBACTAM <=2 SENSITIVE Sensitive     PIP/TAZO <=4 SENSITIVE Sensitive     Extended ESBL NEGATIVE Sensitive     * >=100,000 COLONIES/mL ESCHERICHIA COLI   Klebsiella pneumoniae - MIC*    AMPICILLIN >=32 RESISTANT Resistant     CEFAZOLIN <=4 SENSITIVE Sensitive     CEFTRIAXONE <=1 SENSITIVE Sensitive     CIPROFLOXACIN <=0.25 SENSITIVE Sensitive     GENTAMICIN <=1 SENSITIVE Sensitive     IMIPENEM <=0.25 SENSITIVE Sensitive     NITROFURANTOIN 64 INTERMEDIATE Intermediate     TRIMETH/SULFA <=20 SENSITIVE Sensitive     AMPICILLIN/SULBACTAM 4 SENSITIVE Sensitive     PIP/TAZO <=4 SENSITIVE Sensitive     Extended ESBL NEGATIVE Sensitive     * >=100,000 COLONIES/mL KLEBSIELLA PNEUMONIAE  Culture, blood (Routine x 2)     Status: Abnormal (Preliminary result)   Collection Time: 09/06/18 10:01 PM   Specimen: BLOOD  Result Value Ref Range Status   Specimen Description   Final    BLOOD PORTA CATH Performed at [facility], [place] [address] [place], [place] [address]     Special Requests   Final    BOTTLES DRAWN AEROBIC AND ANAEROBIC Blood Culture adequate volume Performed at [place] [address] [place], [place] [address]    Culture  Setup Time   Final    GRAM NEGATIVE RODS IN BOTH AEROBIC AND ANAEROBIC BOTTLES CRITICAL RESULT CALLED TO, READ BACK BY AND VERIFIED WITH: PHARMD J LEGGE [id number] MLM    Culture (A)  Final    KLEBSIELLA PNEUMONIAE CULTURE REINCUBATED FOR BETTER GROWTH Performed at [facility], [place] [address] [place], [place] [address]    Report Status PENDING  Incomplete  Blood Culture ID Panel (Reflexed)     Status: Abnormal   Collection Time: 09/06/18 10:01 PM  Result Value Ref Range Status   Enterococcus species NOT DETECTED NOT DETECTED Final   Listeria monocytogenes NOT DETECTED NOT DETECTED Final   Staphylococcus species NOT DETECTED NOT DETECTED Final   Staphylococcus aureus (BCID) NOT DETECTED NOT DETECTED Final   Streptococcus species NOT DETECTED NOT DETECTED Final   Streptococcus agalactiae NOT DETECTED NOT DETECTED Final   Streptococcus pneumoniae NOT DETECTED NOT DETECTED Final   Streptococcus pyogenes NOT DETECTED NOT DETECTED Final   Acinetobacter baumannii NOT DETECTED NOT DETECTED Final   Enterobacteriaceae species DETECTED (A) NOT DETECTED Final    Comment: Enterobacteriaceae represent a large family of gram-negative bacteria, not a single organism. CRITICAL RESULT CALLED TO, READ BACK BY AND VERIFIED WITH: PHARMD J LEGGE [id number] MLM    Enterobacter cloacae complex NOT DETECTED NOT DETECTED Final   Escherichia coli NOT DETECTED NOT DETECTED Final   Klebsiella oxytoca  NOT DETECTED NOT DETECTED Final   Klebsiella pneumoniae DETECTED (A) NOT DETECTED Final    Comment: CRITICAL RESULT CALLED TO, READ BACK BY AND VERIFIED WITH: PHARMD J LEGGE [id number] MLM    Proteus species NOT DETECTED NOT DETECTED Final   Serratia marcescens NOT DETECTED NOT DETECTED Final   Carbapenem  resistance NOT DETECTED NOT DETECTED Final   Haemophilus influenzae NOT DETECTED NOT DETECTED Final   Neisseria meningitidis NOT DETECTED NOT DETECTED Final   Pseudomonas aeruginosa NOT DETECTED NOT DETECTED Final   Candida albicans NOT DETECTED NOT DETECTED Final   Candida glabrata NOT DETECTED NOT DETECTED Final   Candida krusei NOT DETECTED NOT DETECTED Final   Candida parapsilosis NOT DETECTED NOT DETECTED Final   Candida tropicalis NOT DETECTED NOT DETECTED Final    Comment: Performed at [facility], [place]. [address], [place], [place] [address]  Culture, blood (Routine x 2)     Status: Abnormal (Preliminary result)   Collection Time: 09/06/18 10:06 PM   Specimen: BLOOD  Result Value Ref Range Status   Specimen Description   Final    BLOOD RIGHT ANTECUBITAL Performed at [place] [address], [place], [place] [address]    Special Requests   Final    BOTTLES DRAWN AEROBIC AND ANAEROBIC Blood Culture adequate volume Performed at [place] [address] [place], [place] [address]    Culture  Setup Time   Final    AEROBIC BOTTLE ONLY GRAM POSITIVE COCCI ANAEROBIC BOTTLE ONLY GRAM NEGATIVE RODS CRITICAL RESULT CALLED TO, READ BACK BY AND VERIFIED WITH: [facility] @ [id number] ON 09/08/18 BY ROBINSON Z.     Culture (A)  Final    KLEBSIELLA PNEUMONIAE SUSCEPTIBILITIES TO FOLLOW STAPHYLOCOCCUS SPECIES (COAGULASE NEGATIVE) THE SIGNIFICANCE OF ISOLATING THIS ORGANISM FROM A SINGLE SET OF BLOOD CULTURES WHEN MULTIPLE SETS ARE DRAWN IS UNCERTAIN. PLEASE NOTIFY THE MICROBIOLOGY DEPARTMENT WITHIN ONE WEEK IF SPECIATION AND SENSITIVITIES ARE REQUIRED. Performed at [facility], [place] [address] [place], [place] [address]    Report Status PENDING  Incomplete  Blood Culture ID Panel (Reflexed)     Status: Abnormal   Collection Time: 09/06/18 10:06 PM  Result Value Ref Range Status   Enterococcus species NOT DETECTED NOT DETECTED  Final   Listeria monocytogenes NOT DETECTED NOT DETECTED Final   Staphylococcus species DETECTED (A) NOT DETECTED Final    Comment: Methicillin (oxacillin) susceptible coagulase negative staphylococcus. Possible blood culture contaminant (unless isolated from more than one blood culture draw or clinical case suggests pathogenicity). No antibiotic treatment is indicated for blood  culture contaminants. CRITICAL RESULT CALLED TO, READ BACK BY AND VERIFIED WITH: [facility] @ 0527 ON 09/08/18 BY ROBINSON Z.    Staphylococcus aureus (BCID) NOT DETECTED NOT DETECTED Final   Methicillin resistance NOT DETECTED NOT DETECTED Final   Streptococcus species NOT DETECTED NOT DETECTED Final   Streptococcus agalactiae NOT DETECTED NOT DETECTED Final   Streptococcus pneumoniae NOT DETECTED NOT DETECTED Final   Streptococcus pyogenes NOT DETECTED NOT DETECTED Final   Acinetobacter baumannii NOT DETECTED NOT DETECTED Final   Enterobacteriaceae species NOT DETECTED NOT DETECTED Final   Enterobacter cloacae complex NOT DETECTED NOT DETECTED Final   Escherichia coli NOT DETECTED NOT DETECTED Final   Klebsiella oxytoca NOT DETECTED NOT DETECTED Final   Klebsiella pneumoniae NOT DETECTED NOT DETECTED Final   Proteus species NOT DETECTED NOT DETECTED Final   Serratia marcescens NOT DETECTED NOT  DETECTED Final   Haemophilus influenzae NOT DETECTED NOT DETECTED Final   Neisseria meningitidis NOT DETECTED NOT DETECTED Final   Pseudomonas aeruginosa NOT DETECTED NOT DETECTED Final   Candida albicans NOT DETECTED NOT DETECTED Final   Candida glabrata NOT DETECTED NOT DETECTED Final   Candida krusei NOT DETECTED NOT DETECTED Final   Candida parapsilosis NOT DETECTED NOT DETECTED Final   Candida tropicalis NOT DETECTED NOT DETECTED Final    Comment: Performed at [facility], [place] [address] [place], [place] [address]  SARS Coronavirus 2 [facility] order, Performed in [facility] hospital lab)  Nasopharyngeal Nasopharyngeal Swab     Status: None   Collection Time: 09/07/18  3:48 AM   Specimen: Nasopharyngeal Swab  Result Value Ref Range Status   SARS Coronavirus 2 NEGATIVE NEGATIVE Final    Comment: (NOTE) If result is NEGATIVE SARS-CoV-2 target nucleic acids are NOT DETECTED. The SARS-CoV-2 RNA is generally detectable in upper and lower  respiratory specimens during the acute phase of infection. The lowest  concentration of SARS-CoV-2 viral copies this assay can detect is 250  copies / mL. A negative result does not preclude SARS-CoV-2 infection  and should not be used as the sole basis for treatment or other  patient management decisions.  A negative result may occur with  improper specimen collection / handling, submission of specimen other  than nasopharyngeal swab, presence of viral mutation(s) within the  areas targeted by this assay, and inadequate number of viral copies  (<250 copies / mL). A negative result must be combined with clinical  observations, patient history, and epidemiological information. If result is POSITIVE SARS-CoV-2 target nucleic acids are DETECTED. The SARS-CoV-2 RNA is generally detectable in upper and lower  respiratory specimens dur ing the acute phase of infection.  Positive  results are indicative of active infection with SARS-CoV-2.  Clinical  correlation with patient history and other diagnostic information is  necessary to determine patient infection status.  Positive results do  not rule out bacterial infection or co-infection with other viruses. If result is PRESUMPTIVE POSTIVE SARS-CoV-2 nucleic acids MAY BE PRESENT.   A presumptive positive result was obtained on the submitted specimen  and confirmed on repeat testing.  While 2019 novel coronavirus  (SARS-CoV-2) nucleic acids may be present in the submitted sample  additional confirmatory testing may be necessary for epidemiological  and / or clinical management purposes  to  differentiate between  SARS-CoV-2 and other Sarbecovirus currently known to infect humans.  If clinically indicated additional testing with an alternate test  methodology [phone]) is advised. The SARS-CoV-2 RNA is generally  detectable in upper and lower respiratory sp ecimens during the acute  phase of infection. The expected result is Negative. Fact Sheet for Patients:  StrictlyIdeas.no Fact Sheet for Healthcare Providers: BankingDealers.co.za This test is not yet approved or cleared by the [place] FDA and has been authorized for detection and/or diagnosis of SARS-CoV-2 by FDA under an Emergency Use Authorization (EUA).  This EUA will remain in effect (meaning this test can be used) for the duration of the COVID-19 declaration under Section 564(b)(1) of the Act, 21 U.S.C. section 360bbb-3(b)(1), unless the authorization is terminated or revoked sooner. Performed at [facility], [place] [address], [place], [place] [address]   Urine culture     Status: Abnormal (Preliminary result)   Collection Time: 09/07/18  3:50 AM   Specimen: Urine, Random  Result Value Ref Range Status   Specimen Description  Final    URINE, RANDOM Performed at [facility], [place] [address] [place], [place] [address]    Special Requests   Final    NONE Performed at [facility], [place] [address] [place], [place] [address]    Culture (A)  Final    >=100,000 COLONIES/mL ESCHERICHIA COLI >=100,000 COLONIES/mL KLEBSIELLA PNEUMONIAE SUSCEPTIBILITIES TO FOLLOW Performed at [place] [address], [place], [place] [address]    Report Status PENDING  Incomplete         Radiology Studies: No results found.      Scheduled Meds: . chlorhexidine  15 mL Mouth Rinse BID  . dexamethasone  2 mg Oral Daily  . dronabinol  2.5 mg Oral BID AC  . DULoxetine  30 mg Oral Daily  . enoxaparin  (LOVENOX) injection  40 mg Subcutaneous Daily  . fentaNYL  1 patch Transdermal Q72H  . magic mouthwash w/lidocaine  15 mL Oral QID  . mouth rinse  15 mL Mouth Rinse q12n4p  . megestrol  400 mg Oral BID  . multivitamin with minerals  1 tablet Oral Daily  . nystatin  5 mL Oral QID  . pantoprazole (PROTONIX) IV  40 mg Intravenous Q24H  . polyethylene glycol  17 g Oral BID  . senna-docusate  2 tablet Oral BID   Continuous Infusions: . cefTRIAXone (ROCEPHIN)  IV 2 g (09/09/18 0937)  . dextrose 125 mL/hr at 09/09/18 0817  . fluconazole (DIFLUCAN) IV Stopped (09/08/18 1541)  . potassium chloride       LOS: 2 days    Time spent: 45 minutes    Irine Seal, MD [facility]  If 7PM-7AM, please contact night-coverage [email or web address] 09/09/2018, 1:13 PM

## 2018-09-09 NOTE — Evaluation (Signed)
Occupational Therapy Evaluation Patient Details Name: Rodney [name] MRN: [id number] DOB: 1970/12/11 Today's Date: 09/09/2018    History of Present Illness Rodney [name] is a 48 y.o. 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  After his last chemotherapy he progressively became more confused and unresponsive.  He was found to have UTI, severe dehydration and acute kidney injury.   Clinical Impression   Pt admitted with UTI.   Pt currently with functional limitations due to the deficits listed below (see OT Problem List).  Pt will benefit from skilled OT to increase their safety and independence with ADL and functional mobility for ADL to facilitate discharge to venue listed below.      Follow Up Recommendations  SNF;Home health OT;Supervision/Assistance - 24 hour(if girlfriend can provide care.)    Equipment Recommendations  None recommended by OT       Precautions / Restrictions Precautions Precautions: Fall Restrictions Weight Bearing Restrictions: No      Mobility Bed Mobility Overal bed mobility: Needs Assistance Bed Mobility: Supine to Sit;Sit to Supine     Supine to sit: Min guard;HOB elevated Sit to supine: Min assist;Mod assist   General bed mobility comments: Pt able to get to EOB with min/guard with cues for hand placement using rails.  Transfers                 General transfer comment: did not perform this session    Balance Overall balance assessment: History of Falls;Needs assistance Sitting-balance support: Feet supported Sitting balance-Leahy Scale: Fair                                     ADL either performed or assessed with clinical judgement   ADL Overall ADL's : Needs assistance/impaired Eating/Feeding: Minimal assistance;Sitting   Grooming: Minimal assistance;Sitting   Upper Body Bathing: Minimal  assistance;Sitting   Lower Body Bathing: Sitting/lateral leans;Maximal assistance   Upper Body Dressing : Set up;Sitting   Lower Body Dressing: Maximal assistance;Sitting/lateral leans;Cueing for safety;Cueing for compensatory techniques                 General ADL Comments: Pt will need signifcant A with ADL activity     Vision Patient Visual Report: No change from baseline              Pertinent Vitals/Pain Pain Assessment: 0-10 Pain Score: 5  Pain Location: R shoulder with AROM Pain Descriptors / Indicators: Sore Pain Intervention(s): Limited activity within patient's tolerance     Hand Dominance     Extremity/Trunk Assessment Upper Extremity Assessment Upper Extremity Assessment: Generalized weakness(Pt needed encouragement to get full ROM of BUE.  Pt complained of pain R shoudler.  After encouragement pt able to scratch head and perform AROM 0-110 degrees FF))           Communication Communication Communication: No difficulties   Cognition Arousal/Alertness: Awake/alert Behavior During Therapy: WFL for tasks assessed/performed Overall Cognitive Status: Impaired/Different from baseline Area of Impairment: Problem solving                             Problem Solving: Slow processing;Requires verbal cues General Comments: Pt oriented, but at times shows some slight confusion.   General Comments  Home Living Family/patient expects to be discharged to:: Private residence Living Arrangements: Spouse/significant other Available Help at Discharge: Family;Available 24 hours/day Type of Home: House Home Access: Ramped entrance     Home Layout: One level     Bathroom Shower/Tub: Tub/shower unit         Home Equipment: Environmental consultant - 2 wheels;Bedside commode;Walker - 4 wheels          Prior Functioning/Environment Level of Independence: Needs assistance  Gait / Transfers Assistance Needed: Amb with RW until 1 week ago when  he became too weak. Family has been assisting him with transfers since then.              OT Problem List: Decreased strength;Decreased activity tolerance;Impaired balance (sitting and/or standing);Decreased safety awareness      OT Treatment/Interventions: Self-care/ADL training;Patient/family education;DME and/or AE instruction;Therapeutic activities;Therapeutic exercise    OT Goals(Current goals can be found in the care plan section) Acute Rehab OT Goals Patient Stated Goal: get stronger OT Goal Formulation: With patient Time For Goal Achievement: 09/16/18 Potential to Achieve Goals: Good  OT Frequency: Min 2X/week    AM-PAC OT "6 Clicks" Daily Activity     Outcome Measure Help from another person eating meals?: A Little Help from another person taking care of personal grooming?: A Little Help from another person toileting, which includes using toliet, bedpan, or urinal?: Total Help from another person bathing (including washing, rinsing, drying)?: A Lot   Help from another person to put on and taking off regular lower body clothing?: Total 6 Click Score: 10   End of Session Nurse Communication: Mobility status  Activity Tolerance: Patient limited by fatigue Patient left: in bed;with call bell/phone within reach  OT Visit Diagnosis: Other abnormalities of gait and mobility (R26.89);Muscle weakness (generalized) (M62.81);History of falling (Z91.81);Unsteadiness on feet (R26.81)                Time: 0215-0230 OT Time Calculation (min): 15 min Charges:  OT General Charges $OT Visit: 1 Visit OT Evaluation $OT Eval Moderate Complexity: 1 Mod  Kari Baars, OT Acute Rehabilitation Services [phone] Office- [phone], Edwena Felty D 09/09/2018, 4:01 PM

## 2018-09-09 NOTE — Progress Notes (Signed)
CRITICAL VALUE ALERT  Critical Value:  161 sodium level  Date & Time Notied: 09/09/2018 at [place]  Provider Notified: Kennon Holter  Orders Received/Actions taken: pending orders.

## 2018-09-10 ENCOUNTER — Telehealth: Payer: Self-pay | Admitting: Oncology

## 2018-09-10 ENCOUNTER — Other Ambulatory Visit: Payer: PRIVATE HEALTH INSURANCE

## 2018-09-10 ENCOUNTER — Ambulatory Visit: Payer: PRIVATE HEALTH INSURANCE

## 2018-09-10 LAB — URINE CULTURE: Culture: >=100000 — AB

## 2018-09-10 LAB — CBC WITH DIFFERENTIAL/PLATELET
Abs Immature Granulocytes: 0.03 10*3/uL (ref 0.00–0.07)
Basophils Absolute: 0 10*3/uL (ref 0.0–0.1)
Basophils Relative: 0 %
Eosinophils Absolute: 0 10*3/uL (ref 0.0–0.5)
Eosinophils Relative: 0 %
HCT: 29.1 % — ABNORMAL LOW (ref 39.0–52.0)
Hemoglobin: 9 g/dL — ABNORMAL LOW (ref 13.0–17.0)
Immature Granulocytes: 1 %
Lymphocytes Relative: 6 %
Lymphs Abs: 0.1 10*3/uL — ABNORMAL LOW (ref 0.7–4.0)
MCH: 30.7 pg (ref 26.0–34.0)
MCHC: 30.9 g/dL (ref 30.0–36.0)
MCV: 99.3 fL (ref 80.0–100.0)
Monocytes Absolute: 0 10*3/uL — ABNORMAL LOW (ref 0.1–1.0)
Monocytes Relative: 1 %
Neutro Abs: 2.2 10*3/uL (ref 1.7–7.7)
Neutrophils Relative %: 92 %
Platelets: 97 10*3/uL — ABNORMAL LOW (ref 150–400)
RBC: 2.93 MIL/uL — ABNORMAL LOW (ref 4.22–5.81)
RDW: 17.5 % — ABNORMAL HIGH (ref 11.5–15.5)
WBC: 2.4 10*3/uL — ABNORMAL LOW (ref 4.0–10.5)
nRBC: 0 % (ref 0.0–0.2)

## 2018-09-10 LAB — CULTURE, BLOOD (ROUTINE X 2)
Special Requests: ADEQUATE
Special Requests: ADEQUATE

## 2018-09-10 LAB — COMPREHENSIVE METABOLIC PANEL
ALT: 73 U/L — ABNORMAL HIGH (ref 0–44)
AST: 91 U/L — ABNORMAL HIGH (ref 15–41)
Albumin: 2.1 g/dL — ABNORMAL LOW (ref 3.5–5.0)
Alkaline Phosphatase: 145 U/L — ABNORMAL HIGH (ref 38–126)
Anion gap: 3 — ABNORMAL LOW (ref 5–15)
BUN: 28 mg/dL — ABNORMAL HIGH (ref 6–20)
CO2: 24 mmol/L (ref 22–32)
Calcium: 8.9 mg/dL (ref 8.9–10.3)
Chloride: 122 mmol/L — ABNORMAL HIGH (ref 98–111)
Creatinine, Ser: 0.95 mg/dL (ref 0.61–1.24)
GFR calc Af Amer: >60 mL/min (ref >60)
GFR calc non Af Amer: >60 mL/min (ref >60)
Glucose, Bld: 116 mg/dL — ABNORMAL HIGH (ref 70–99)
Potassium: 4 mmol/L (ref 3.5–5.1)
Sodium: 149 mmol/L — ABNORMAL HIGH (ref 135–145)
Total Bilirubin: 0.8 mg/dL (ref 0.3–1.2)
Total Protein: 5.5 g/dL — ABNORMAL LOW (ref 6.5–8.1)

## 2018-09-10 LAB — BASIC METABOLIC PANEL
Anion gap: 7 (ref 5–15)
BUN: 19 mg/dL (ref 6–20)
CO2: 22 mmol/L (ref 22–32)
Calcium: 8.6 mg/dL — ABNORMAL LOW (ref 8.9–10.3)
Chloride: 119 mmol/L — ABNORMAL HIGH (ref 98–111)
Creatinine, Ser: 0.91 mg/dL (ref 0.61–1.24)
GFR calc Af Amer: >60 mL/min (ref >60)
GFR calc non Af Amer: >60 mL/min (ref >60)
Glucose, Bld: 123 mg/dL — ABNORMAL HIGH (ref 70–99)
Potassium: 4.3 mmol/L (ref 3.5–5.1)
Sodium: 148 mmol/L — ABNORMAL HIGH (ref 135–145)

## 2018-09-10 LAB — MAGNESIUM: Magnesium: 2.2 mg/dL (ref 1.7–2.4)

## 2018-09-10 LAB — PHOSPHORUS: Phosphorus: 1.8 mg/dL — ABNORMAL LOW (ref 2.5–4.6)

## 2018-09-10 MED ORDER — POTASSIUM PHOSPHATES 15 MMOLE/5ML IV SOLN
30.0000 mmol | Freq: Once | INTRAVENOUS | Status: AC
  Administered 2018-09-10: 10:00:00 30 mmol via INTRAVENOUS
  Filled 2018-09-10: qty 10

## 2018-09-10 NOTE — Progress Notes (Signed)
HR= 101. Patient worked with PT to transfer from bed to chair.

## 2018-09-10 NOTE — Progress Notes (Signed)
Occupational Therapy Treatment Patient Details Name: Rodney [name] MRN: [id number] DOB: [birth year] Today's Date: 09/10/2018    History of present illness MCHALE HEINZMANN is a 48 y.o. 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  After his last chemotherapy he progressively became more confused and unresponsive.  He was found to have UTI, severe dehydration and acute kidney injury.   OT comments  Pt progressing towards acute OT goals. Focus of session was LB dressing, pericare, and lateral scoot into drop arm recliner to simulate toilet transfer. SO present and involved during session, assisting with pt care tasks. SO and pt spoke of goal to return home. Home d/c appears to be the most likely d/c path, especially given support pt as at home. Will leave ST SNF recommendation in case level of available assist at home should change. Worked on partial standing during pericare and LB dressing. Pt able to briefly clear hips from bed on second attempt.    Follow Up Recommendations  Supervision/Assistance - 24 hour;Home health OT;SNF    Equipment Recommendations  None recommended by OT    Recommendations for Other Services      Precautions / Restrictions Precautions Precautions: Fall Restrictions Weight Bearing Restrictions: No       Mobility Bed Mobility Overal bed mobility: Needs Assistance Bed Mobility: Supine to Sit     Supine to sit: Min guard;HOB elevated     General bed mobility comments: Min guard for safety  Transfers Overall transfer level: Needs assistance Equipment used: Rolling walker (2 wheeled);None Transfers: Lateral/Scoot Transfers          Lateral/Scoot Transfers: Min guard General transfer comment: worked on partial stand during pericare/LB dressing activities. Able to clear hips briefly on second attempt using rw.     Balance Overall balance  assessment: History of Falls;Needs assistance Sitting-balance support: Feet supported Sitting balance-Leahy Scale: Fair         Standing balance comment: unable to fully stand this session. Hips cleared bed briefly on second attempt.                           ADL either performed or assessed with clinical judgement   ADL Overall ADL's : Needs assistance/impaired                     Lower Body Dressing: Maximal assistance;Sit to/from stand;Sitting/lateral leans Lower Body Dressing Details (indicate cue type and reason): SO assisted to don LB dressing. Pt able to clear hips just high and long enought to complete LB dressing.  Toilet Transfer: Technical brewer Details (indicate cue type and reason): lateral scoot EOB>drop arm recliner. No physical, hands on assist needed.  Toileting- Clothing Manipulation and Hygiene: Maximal assistance;Sitting/lateral lean;Sit to/from stand Toileting - Clothing Manipulation Details (indicate cue type and reason): leaned forward and partially elevated hips from bed enough for therapist to complete pericare and change out pad. Pt with some bowel incontenience noted once sitting EOB.       General ADL Comments: Pt will need signifcant A with ADL activity. Pt was able to work on partial stands this session 2x with improvement noted on second attempt. Able to breifly clear hips from bed on second attempt. EOB elevated.      Vision       Perception     Praxis  Cognition Arousal/Alertness: Awake/alert Behavior During Therapy: WFL for tasks assessed/performed Overall Cognitive Status: Impaired/Different from baseline Area of Impairment: Problem solving                             Problem Solving: Slow processing;Requires verbal cues General Comments: Pt oriented, but at times shows some slight confusion. SO present and providing occasional redirection to task        Exercises     Shoulder  Instructions       General Comments      Pertinent Vitals/ Pain       Pain Assessment: Faces Faces Pain Scale: Hurts little more Pain Location: unspecified, grimacing with bed mobility and lateral scoot transfer Pain Descriptors / Indicators: Sore Pain Intervention(s): Monitored during session;Limited activity within patient's tolerance;Repositioned  Home Living                                          Prior Functioning/Environment              Frequency  Min 2X/week        Progress Toward Goals  OT Goals(current goals can now be found in the care plan section)  Progress towards OT goals: Progressing toward goals  Acute Rehab OT Goals Patient Stated Goal: get stronger, return home OT Goal Formulation: With patient Time For Goal Achievement: 09/16/18 Potential to Achieve Goals: Good ADL Goals Pt Will Perform Grooming: with supervision;sitting Pt Will Perform Lower Body Dressing: with min assist;sit to/from stand;sitting/lateral leans;with caregiver independent in assisting Pt Will Transfer to Toilet: with min assist;bedside commode;stand pivot transfer Pt Will Perform Toileting - Clothing Manipulation and hygiene: with min assist;sit to/from stand;sitting/lateral leans;with caregiver independent in assisting  Plan Discharge plan remains appropriate    Co-evaluation                 AM-PAC OT "6 Clicks" Daily Activity     Outcome Measure   Help from another person eating meals?: A Little Help from another person taking care of personal grooming?: A Little Help from another person toileting, which includes using toliet, bedpan, or urinal?: Total Help from another person bathing (including washing, rinsing, drying)?: A Lot Help from another person to put on and taking off regular upper body clothing?: A Lot Help from another person to put on and taking off regular lower body clothing?: Total 6 Click Score: 12    End of Session Equipment  Utilized During Treatment: Rolling walker  OT Visit Diagnosis: Other abnormalities of gait and mobility (R26.89);Muscle weakness (generalized) (M62.81);History of falling (Z91.81);Unsteadiness on feet (R26.81)   Activity Tolerance Patient limited by fatigue   Patient Left in chair;with call bell/phone within reach;with family/visitor present;with chair alarm set   Nurse Communication          Time: [id number] OT Time Calculation (min): 28 min  Charges: OT General Charges $OT Visit: 1 Visit OT Treatments $Self Care/Home Management : 23-37 mins  Tyrone Schimke, OT Acute Rehabilitation Services Pager: [phone] Office: [phone]    Hortencia Pilar 09/10/2018, 1:14 PM

## 2018-09-10 NOTE — TOC Progression Note (Signed)
Transition of Care [facility]) - Progression Note    Patient Details  Name: Rodney [name] MRN: [id number] Date of Birth: May 20, 1970  Transition of Care [facility]) CM/SW [place], [place] Phone Number: [phone] 09/10/2018, 11:17 AM  Clinical Narrative:   Pt's HHPT/OT/aide/SW arranged with [facility] (pt has used agency twice in the past. Orders placed.          Expected Discharge Plan and [place] [facility] services     Expected Discharge Date: (unknown)                       Social Determinants of Health (SDOH) Interventions    Readmission Risk Interventions No flowsheet data found.

## 2018-09-10 NOTE — Telephone Encounter (Signed)
Called and spoke with patient. Confirmed sept appts  °

## 2018-09-10 NOTE — Progress Notes (Signed)
PROGRESS NOTE    Rodney [name]  [id number] DOB: [birth year] DOA: 09/06/2018 PCP: Curlene Labrum, MD    Brief Narrative:  HPI per Dr. Si Raider is a 48 y.o. 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  He has been awake and alert and communicating at the time.  Since then he started declining.  After his chemotherapy on Tuesday he has progressively worsened even more.  Not eating or drinking adequately.  In the last 24 hours he has been unresponsive.  Wife brought patient to the ER where he was evaluated.  He was found to have UTI, severe dehydration and acute kidney injury.  Patient is currently not able to give any history so history is obtained from the wife who is at bedside.  He has had some fever at home.  Patient remains a full code.  Is being admitted with altered mental status as well as significant UTI..  ED Course: Temperature 97.6 blood pressure 89/72, pulse 123 with respirate 22 and oxygen sat 100% on room air.  White count is 6.0 hemoglobin 10.3 platelets 325.  Sodium 148 potassium 4.9 chloride 127 CO2 13 BUN 98 creatinine 1.43 and calcium 10.0.  Urinalysis showed moderate leukocytes.  Very turbid urine.  Many bacteria.  WBC 21-50.  Triple phosphate crystals present.  Head CT without contrast showed no acute findings.  Showed bibasilar atelectasis.  Patient is being admitted for further treatment and initiated on antibiotics.   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 coli and Klebsiella UTI Urine cultures preliminary results with greater than 100,000 colonies of E. coli and Klebsiella pneumonia.  Patient improving clinically however not at baseline.  Continue IV Rocephin 2 g daily as patient now with a Klebsiella bacteremia.  Would likely transition to oral antibiotics possibly Bactrim to complete a 7 to 10-day course of antibiotic treatment.  Continue IV fluids.  Supportive care.  2.  Klebsiella pneumoniae bacteremia Likely seeded from the urine.  Patient immunocompromised with recent chemotherapy.  Continue IV Rocephin 2 g daily.  Could likely transition to oral antibiotics in the next 1 to 2 days.  May consider transitioning to Bactrim to complete a 10 to 14-day course of antibiotic treatment.  Supportive care.  3.  Acute metabolic encephalopathy Likely multifactorial secondary to problems #1 and 2, dehydration, metastatic disease.  Improving clinically on a daily basis.  Continue IV antibiotics, IV fluids, supportive care.   4.  Odynophagia/oral thrush/??  Mucositis On admission patient noted to have concerns for oral thrush.  Patient with complaints of odynophagia.  Improving clinically.  Concern for possible mucositis and Candida esophagitis.  Patient on nystatin swish and swallow as well as Magic mouthwash with lidocaine. Continue IV Diflucan.  Follow.  5.  Chemotherapy-induced anemia and neutropenia Patient noted to be anemic hemoglobin dropped to 6.8 from 10.3 on admission.  Partly likely dilutional.  Patient status post 2 units of packed  red blood cell transfusion on 09/08/2018.  Hemoglobin currently at 9.  Patient with no overt bleeding.  Patient with recent chemotherapy 3 days prior to admission. Follow H&H.  May require Neupogen if ANC is less than 1.  Oncology following.  6.  Hypovolemic hypernatremia Likely secondary to significant dehydration/volume depletion secondary to odynophagia, failure to thrive.  Discontinued bicarb and placed on D5W.  Sodium level worsening at 161 and trending back down.  Sodium level at 149 this morning.  Continue D5W.  Repeat labs this afternoon.  Follow.   7.  Hypokalemia/hypophosphatemia Magnesium at 2.2.  Phosphorus at 1.8.  K-Phos 30 mmol IV x1.  Repeat labs in the morning.  Continue supportive care.  Follow.    8.  Borderline blood pressure Likely multifactorial secondary to bacteremia and UTI and volume depletion.  Improved with hydration.  Continue IV Rocephin.  Follow.   9.  Acute kidney injury Likely secondary to a prerenal azotemia in the setting of significant dehydration with poor oral intake.  Renal function improved with hydration.  Continue IV fluids.  Follow.   10.  prostate cancer/stage IV bladder cancer with metastases Patient diagnosed with bladder cancer in June 2019 subsequently developed stage IV bladder cancer with involvement with lung bone and liver documented in July 2020.  Patient diagnosed with prostate cancer in July 2019 with a Gleason score of 6 and a PSA of 5.58 status post prostatectomy.  Patient receiving palliative chemotherapy.  Oncology has been consulted and are following.  Per oncology patient's next cycle of chemotherapy scheduled for 09/25/2018 and chemotherapy on hold for this week.  Per oncology.  11.  Failure to thrive/moderate protein calorie malnutrition Secondary to problem #10.  Palliative care consulted and are following.  Dietitian following.   DVT prophylaxis: SCDs Code Status: Full Family Communication: Updated patient and fiance at bedside.  Disposition Plan: Likely home with home health when clinically improved, transition to oral antibiotics, resolution of electrolyte abnormalities, stabilization of counts hopefully in the next 1 to 2 days.   Consultants:   Oncology: Dr. Jana Hakim 09/07/2018  Palliative care: Dr. Rowe Pavy 09/07/2018  Procedures:   Transfusion 2 units packed red blood cells 09/08/2018  CT head 09/07/2018  Chest x-ray 09/06/2018    Antimicrobials:   IV Rocephin 09/07/2018   Subjective: Patient sitting up in chair.  Fianc at bedside.  Alert and oriented.  Feeling much better.  States he is starting to eat a little bit more.  Denies any chest pain or shortness of breath.  Denies any overt bleeding.  Wondering when he is going to be able to go home.   Objective: Vitals:   09/09/18 1336 09/09/18 2038 09/10/18 0443 09/10/18 0643  BP: 127/88 (!) 119/91 (!) 127/92 111/61  Pulse: 87 86 93 92  Resp: 16 17 17 17   Temp: 97.8 F (36.6 C) 99.3 F (37.4 C) 98.8 F (37.1 C) 98.1 F (36.7 C)  TempSrc: Oral Oral Oral Oral  SpO2: 100% 100% 98% 100%  Weight:    81.8 kg  Height:        Intake/Output Summary (Last  24 hours) at 09/10/2018 1332 Last data filed at 09/10/2018 1007 Gross per 24 hour  Intake 3068.53 ml  Output 3850 ml  Net -781.47 ml   Filed Weights   09/09/18 0450 09/10/18 0643  Weight: 65.8 kg 81.8 kg    Examination:  General exam: Appears calm and comfortable.  Frail.  Cachectic. Respiratory system: Lungs clear to auscultation bilaterally.  No wheezes, no crackles, no rhonchi.  Normal respiratory effort.  Cardiovascular system: RRR no murmurs rubs or gallops.  No JVD.  No lower extremity edema.  Gastrointestinal system: Abdomen is nontender, nondistended, soft, positive bowel sounds.  No rebound.  No guarding.  Central nervous system: Alert and oriented. No focal neurological deficits. Extremities: Symmetric 5 x 5 power. Skin: No rashes, lesions or ulcers Psychiatry: Judgement and insight  appear normal. Mood & affect appropriate.     Data Reviewed: I have personally reviewed following labs and imaging studies  CBC: Recent Labs  Lab 09/06/18 2201 09/07/18 0843 09/08/18 0332 09/09/18 0424 09/10/18 0413  WBC 6.0 2.8* 2.1* 2.9* 2.4*  NEUTROABS 5.7 2.6 2.0 2.7 2.2  HGB 10.3* 8.4* 6.8* 8.9* 9.0*  HCT 34.5* 28.6* 22.0* 28.0* 29.1*  MCV 101.8* 102.1* 98.2 97.9 99.3  PLT 325 256 205 146* 97*   Basic Metabolic Panel: Recent Labs  Lab 09/07/18 0843 09/08/18 0332 09/09/18 0424 09/09/18 0926 09/09/18 1458 09/10/18 0413  NA 154* 158* 161*  161* 154* 153* 149*  K 3.8 2.7* 3.6  3.6 3.3* 3.7 4.0  CL >130* 128* 129*  129* 125* 124* 122*  CO2 10* 23 23  24  21* 20* 24  GLUCOSE 176* 190* 113*  112* 187* 143* 116*  BUN 83* 65* 48*  49* 43* 36* 28*  CREATININE 1.29* 1.21 1.09  1.10 1.00 0.94 0.95  CALCIUM 9.4 8.6* 9.0  8.9 8.6* 8.6* 8.9  MG 2.6* 2.4  --   --   --  2.2  PHOS 3.5 1.9* 3.3  --   --  1.8*   GFR: Estimated Creatinine Clearance: 96.1 mL/min (by C-G formula based on SCr of 0.95 mg/dL). Liver Function Tests: Recent Labs  Lab 09/06/18 2201 09/07/18 0843 09/08/18 0332 09/09/18 0424 09/09/18 0926 09/10/18 0413  AST 30  --   --   --  37 91*  ALT 39  --   --   --  30 73*  ALKPHOS 224*  --   --   --  136* 145*  BILITOT 0.9  --   --   --  0.4 0.8  PROT 7.1  --   --   --  5.4* 5.5*  ALBUMIN 2.9* 2.3* 1.9* 2.0* 2.0* 2.1*   No results for input(s): LIPASE, AMYLASE in the last 168 hours. No results for input(s): AMMONIA in the last 168 hours. Coagulation Profile: Recent Labs  Lab 09/06/18 2201  INR 1.1   Cardiac Enzymes: No results for input(s): CKTOTAL, CKMB, CKMBINDEX, TROPONINI in the last 168 hours. BNP (last 3 results) No results for input(s): PROBNP in the last 8760 hours. HbA1C: No results for input(s): HGBA1C in the last 72 hours. CBG: No results for input(s): GLUCAP in the last 168 hours. Lipid Profile: No results for input(s): CHOL,  HDL, LDLCALC, TRIG, CHOLHDL, LDLDIRECT in the last 72 hours. Thyroid Function Tests: No results for input(s): TSH, T4TOTAL, FREET4, T3FREE, THYROIDAB in the last 72 hours. Anemia Panel: Recent Labs    09/08/18 0332  VITAMINB12 >7,500*  FOLATE 22.5  FERRITIN 5,329*  TIBC  142*  IRON 78  RETICCTPCT 1.3   Sepsis Labs: Recent Labs  Lab 09/06/18 2201 09/07/18 0008  LATICACIDVEN 1.3 0.9    Recent Results (from the past 240 hour(s))  Urine culture     Status: Abnormal   Collection Time: 08/31/18  6:45 PM   Specimen: Urine, Catheterized  Result Value Ref Range Status   Specimen Description   Final    URINE, CATHETERIZED Performed at [facility], [address], [place], [place] [address]    Special Requests   Final    NONE Performed at [facility], [address] [place], [place] [address]    Culture (A)  Final    >=100,000 COLONIES/mL ESCHERICHIA COLI >=100,000 COLONIES/mL KLEBSIELLA PNEUMONIAE    Report Status 09/03/2018 FINAL  Final   Organism ID, Bacteria ESCHERICHIA COLI (A)  Final   Organism ID, Bacteria KLEBSIELLA PNEUMONIAE (A)  Final      Susceptibility   Escherichia coli - MIC*    AMPICILLIN <=2 SENSITIVE Sensitive     CEFAZOLIN <=4 SENSITIVE Sensitive     CEFTRIAXONE <=1 SENSITIVE Sensitive     CIPROFLOXACIN <=0.25 SENSITIVE Sensitive     GENTAMICIN <=1 SENSITIVE Sensitive     IMIPENEM <=0.25 SENSITIVE Sensitive     NITROFURANTOIN <=16 SENSITIVE Sensitive     TRIMETH/SULFA <=20 SENSITIVE Sensitive     AMPICILLIN/SULBACTAM <=2 SENSITIVE Sensitive     PIP/TAZO <=4 SENSITIVE Sensitive     Extended ESBL NEGATIVE Sensitive     * >=100,000 COLONIES/mL ESCHERICHIA COLI   Klebsiella pneumoniae - MIC*    AMPICILLIN >=32 RESISTANT Resistant     CEFAZOLIN <=4 SENSITIVE Sensitive     CEFTRIAXONE <=1 SENSITIVE Sensitive     CIPROFLOXACIN <=0.25 SENSITIVE Sensitive     GENTAMICIN <=1 SENSITIVE Sensitive     IMIPENEM <=0.25 SENSITIVE Sensitive     NITROFURANTOIN 64  INTERMEDIATE Intermediate     TRIMETH/SULFA <=20 SENSITIVE Sensitive     AMPICILLIN/SULBACTAM 4 SENSITIVE Sensitive     PIP/TAZO <=4 SENSITIVE Sensitive     Extended ESBL NEGATIVE Sensitive     * >=100,000 COLONIES/mL KLEBSIELLA PNEUMONIAE  Culture, blood (Routine x 2)     Status: Abnormal   Collection Time: 09/06/18 10:01 PM   Specimen: BLOOD  Result Value Ref Range Status   Specimen Description   Final    BLOOD PORTA CATH Performed at [facility] [address], [place], [place] [address]    Special Requests   Final    BOTTLES DRAWN AEROBIC AND ANAEROBIC Blood Culture adequate volume Performed at [facility] [address] [place], [place] [address]    Culture  Setup Time   Final    GRAM NEGATIVE RODS IN BOTH AEROBIC AND ANAEROBIC BOTTLES CRITICAL RESULT CALLED TO, READ BACK BY AND VERIFIED WITH: Otila Back [id number] MLM Performed at [facility], [place] [address], [place], [place] [address]    Culture KLEBSIELLA PNEUMONIAE (A)  Final   Report Status 09/10/2018 FINAL  Final   Organism ID, Bacteria KLEBSIELLA PNEUMONIAE  Final      Susceptibility   Klebsiella pneumoniae - MIC*    AMPICILLIN >=32 RESISTANT Resistant     CEFAZOLIN <=4 SENSITIVE Sensitive     CEFEPIME <=1 SENSITIVE Sensitive     CEFTAZIDIME <=1 SENSITIVE Sensitive     CEFTRIAXONE <=1 SENSITIVE Sensitive     CIPROFLOXACIN <=0.25 SENSITIVE Sensitive     GENTAMICIN <=1 SENSITIVE Sensitive  IMIPENEM <=0.25 SENSITIVE Sensitive     TRIMETH/SULFA <=20 SENSITIVE Sensitive     AMPICILLIN/SULBACTAM 8 SENSITIVE Sensitive     PIP/TAZO <=4 SENSITIVE Sensitive     Extended ESBL NEGATIVE Sensitive     * KLEBSIELLA PNEUMONIAE  Blood Culture ID Panel (Reflexed)     Status: Abnormal   Collection Time: 09/06/18 10:01 PM  Result Value Ref Range Status   Enterococcus species NOT DETECTED NOT DETECTED Final   Listeria monocytogenes NOT DETECTED NOT DETECTED Final    Staphylococcus species NOT DETECTED NOT DETECTED Final   Staphylococcus aureus (BCID) NOT DETECTED NOT DETECTED Final   Streptococcus species NOT DETECTED NOT DETECTED Final   Streptococcus agalactiae NOT DETECTED NOT DETECTED Final   Streptococcus pneumoniae NOT DETECTED NOT DETECTED Final   Streptococcus pyogenes NOT DETECTED NOT DETECTED Final   Acinetobacter baumannii NOT DETECTED NOT DETECTED Final   Enterobacteriaceae species DETECTED (A) NOT DETECTED Final    Comment: Enterobacteriaceae represent a large family of gram-negative bacteria, not a single organism. CRITICAL RESULT CALLED TO, READ BACK BY AND VERIFIED WITH: PHARMD J LEGGE [id number] MLM    Enterobacter cloacae complex NOT DETECTED NOT DETECTED Final   Escherichia coli NOT DETECTED NOT DETECTED Final   Klebsiella oxytoca NOT DETECTED NOT DETECTED Final   Klebsiella pneumoniae DETECTED (A) NOT DETECTED Final    Comment: CRITICAL RESULT CALLED TO, READ BACK BY AND VERIFIED WITH: PHARMD J LEGGE [id number] MLM    Proteus species NOT DETECTED NOT DETECTED Final   Serratia marcescens NOT DETECTED NOT DETECTED Final   Carbapenem resistance NOT DETECTED NOT DETECTED Final   Haemophilus influenzae NOT DETECTED NOT DETECTED Final   Neisseria meningitidis NOT DETECTED NOT DETECTED Final   Pseudomonas aeruginosa NOT DETECTED NOT DETECTED Final   Candida albicans NOT DETECTED NOT DETECTED Final   Candida glabrata NOT DETECTED NOT DETECTED Final   Candida krusei NOT DETECTED NOT DETECTED Final   Candida parapsilosis NOT DETECTED NOT DETECTED Final   Candida tropicalis NOT DETECTED NOT DETECTED Final    Comment: Performed at [facility], [place]. [address] [place], [place] [address]  Culture, blood (Routine x 2)     Status: Abnormal   Collection Time: 09/06/18 10:06 PM   Specimen: BLOOD  Result Value Ref Range Status   Specimen Description   Final    BLOOD RIGHT ANTECUBITAL Performed at [facility] [address] [place], [place] [address]    Special Requests   Final    BOTTLES DRAWN AEROBIC AND ANAEROBIC Blood Culture adequate volume Performed at [facility] [address], [place], [place] [address]    Culture  Setup Time   Final    AEROBIC BOTTLE ONLY GRAM POSITIVE COCCI ANAEROBIC BOTTLE ONLY GRAM NEGATIVE RODS CRITICAL RESULT CALLED TO, READ BACK BY AND VERIFIED WITH: [facility] @ [id number] ON 09/08/18 BY ROBINSON Z.     Culture (A)  Final    KLEBSIELLA PNEUMONIAE SUSCEPTIBILITIES PERFORMED ON PREVIOUS CULTURE WITHIN THE LAST 5 DAYS. STAPHYLOCOCCUS SPECIES (COAGULASE NEGATIVE) THE SIGNIFICANCE OF ISOLATING THIS ORGANISM FROM A SINGLE SET OF BLOOD CULTURES WHEN MULTIPLE SETS ARE DRAWN IS UNCERTAIN. PLEASE NOTIFY THE MICROBIOLOGY DEPARTMENT WITHIN ONE WEEK IF SPECIATION AND SENSITIVITIES ARE REQUIRED. Performed at [facility], [place] [address], [place], [place] [address]    Report Status 09/10/2018 FINAL  Final  Blood Culture ID Panel (Reflexed)     Status: Abnormal   Collection Time:  09/06/18 10:06 PM  Result Value Ref Range Status   Enterococcus species NOT DETECTED NOT DETECTED Final   Listeria monocytogenes NOT DETECTED NOT DETECTED Final   Staphylococcus species DETECTED (A) NOT DETECTED Final    Comment: Methicillin (oxacillin) susceptible coagulase negative staphylococcus. Possible blood culture contaminant (unless isolated from more than one blood culture draw or clinical case suggests pathogenicity). No antibiotic treatment is indicated for blood  culture contaminants. CRITICAL RESULT CALLED TO, READ BACK BY AND VERIFIED WITH: [facility] @ 0527 ON 09/08/18 BY ROBINSON Z.    Staphylococcus aureus (BCID) NOT DETECTED NOT DETECTED Final   Methicillin resistance NOT DETECTED NOT DETECTED Final   Streptococcus species NOT DETECTED NOT DETECTED Final   Streptococcus agalactiae NOT DETECTED NOT DETECTED Final   Streptococcus pneumoniae  NOT DETECTED NOT DETECTED Final   Streptococcus pyogenes NOT DETECTED NOT DETECTED Final   Acinetobacter baumannii NOT DETECTED NOT DETECTED Final   Enterobacteriaceae species NOT DETECTED NOT DETECTED Final   Enterobacter cloacae complex NOT DETECTED NOT DETECTED Final   Escherichia coli NOT DETECTED NOT DETECTED Final   Klebsiella oxytoca NOT DETECTED NOT DETECTED Final   Klebsiella pneumoniae NOT DETECTED NOT DETECTED Final   Proteus species NOT DETECTED NOT DETECTED Final   Serratia marcescens NOT DETECTED NOT DETECTED Final   Haemophilus influenzae NOT DETECTED NOT DETECTED Final   Neisseria meningitidis NOT DETECTED NOT DETECTED Final   Pseudomonas aeruginosa NOT DETECTED NOT DETECTED Final   Candida albicans NOT DETECTED NOT DETECTED Final   Candida glabrata NOT DETECTED NOT DETECTED Final   Candida krusei NOT DETECTED NOT DETECTED Final   Candida parapsilosis NOT DETECTED NOT DETECTED Final   Candida tropicalis NOT DETECTED NOT DETECTED Final    Comment: Performed at [facility], [place] [address] [place], [place] [address]  SARS Coronavirus 2 [facility] order, Performed in [facility] hospital lab) Nasopharyngeal Nasopharyngeal Swab     Status: None   Collection Time: 09/07/18  3:48 AM   Specimen: Nasopharyngeal Swab  Result Value Ref Range Status   SARS Coronavirus 2 NEGATIVE NEGATIVE Final    Comment: (NOTE) If result is NEGATIVE SARS-CoV-2 target nucleic acids are NOT DETECTED. The SARS-CoV-2 RNA is generally detectable in upper and lower  respiratory specimens during the acute phase of infection. The lowest  concentration of SARS-CoV-2 viral copies this assay can detect is 250  copies / mL. A negative result does not preclude SARS-CoV-2 infection  and should not be used as the sole basis for treatment or other  patient management decisions.  A negative result may occur with  improper specimen collection / handling, submission of specimen other  than nasopharyngeal  swab, presence of viral mutation(s) within the  areas targeted by this assay, and inadequate number of viral copies  (<250 copies / mL). A negative result must be combined with clinical  observations, patient history, and epidemiological information. If result is POSITIVE SARS-CoV-2 target nucleic acids are DETECTED. The SARS-CoV-2 RNA is generally detectable in upper and lower  respiratory specimens dur ing the acute phase of infection.  Positive  results are indicative of active infection with SARS-CoV-2.  Clinical  correlation with patient history and other diagnostic information is  necessary to determine patient infection status.  Positive results do  not rule out bacterial infection or co-infection with other viruses. If result is PRESUMPTIVE POSTIVE SARS-CoV-2 nucleic acids MAY BE PRESENT.   A presumptive positive result was obtained on the submitted specimen  and  confirmed on repeat testing.  While 2019 novel coronavirus  (SARS-CoV-2) nucleic acids may be present in the submitted sample  additional confirmatory testing may be necessary for epidemiological  and / or clinical management purposes  to differentiate between  SARS-CoV-2 and other Sarbecovirus currently known to infect humans.  If clinically indicated additional testing with an alternate test  methodology [phone]) is advised. The SARS-CoV-2 RNA is generally  detectable in upper and lower respiratory sp ecimens during the acute  phase of infection. The expected result is Negative. Fact Sheet for Patients:  StrictlyIdeas.no Fact Sheet for Healthcare Providers: BankingDealers.co.za This test is not yet approved or cleared by the [place] FDA and has been authorized for detection and/or diagnosis of SARS-CoV-2 by FDA under an Emergency Use Authorization (EUA).  This EUA will remain in effect (meaning this test can be used) for the duration of the COVID-19 declaration  under Section 564(b)(1) of the Act, 21 U.S.C. section 360bbb-3(b)(1), unless the authorization is terminated or revoked sooner. Performed at [facility], [place] [address], [place], [place] [address]   Urine culture     Status: Abnormal   Collection Time: 09/07/18  3:50 AM   Specimen: Urine, Random  Result Value Ref Range Status   Specimen Description   Final    URINE, RANDOM Performed at [facility] [address] [place], [place] [address]    Special Requests   Final    NONE Performed at [facility], [place] [address] [place], [place] [address]    Culture (A)  Final    >=100,000 COLONIES/mL ESCHERICHIA COLI >=100,000 COLONIES/mL KLEBSIELLA PNEUMONIAE    Report Status 09/10/2018 FINAL  Final   Organism ID, Bacteria ESCHERICHIA COLI (A)  Final   Organism ID, Bacteria KLEBSIELLA PNEUMONIAE (A)  Final      Susceptibility   Escherichia coli - MIC*    AMPICILLIN <=2 SENSITIVE Sensitive     CEFAZOLIN <=4 SENSITIVE Sensitive     CEFTRIAXONE <=1 SENSITIVE Sensitive     CIPROFLOXACIN <=0.25 SENSITIVE Sensitive     GENTAMICIN <=1 SENSITIVE Sensitive     IMIPENEM <=0.25 SENSITIVE Sensitive     NITROFURANTOIN <=16 SENSITIVE Sensitive     TRIMETH/SULFA <=20 SENSITIVE Sensitive     AMPICILLIN/SULBACTAM <=2 SENSITIVE Sensitive     PIP/TAZO <=4 SENSITIVE Sensitive     Extended ESBL NEGATIVE Sensitive     * >=100,000 COLONIES/mL ESCHERICHIA COLI   Klebsiella pneumoniae - MIC*    AMPICILLIN >=32 RESISTANT Resistant     CEFAZOLIN <=4 SENSITIVE Sensitive     CEFTRIAXONE <=1 SENSITIVE Sensitive     CIPROFLOXACIN <=0.25 SENSITIVE Sensitive     GENTAMICIN <=1 SENSITIVE Sensitive     IMIPENEM <=0.25 SENSITIVE Sensitive     NITROFURANTOIN 64 INTERMEDIATE Intermediate     TRIMETH/SULFA <=20 SENSITIVE Sensitive     AMPICILLIN/SULBACTAM 8 SENSITIVE Sensitive     PIP/TAZO <=4 SENSITIVE Sensitive     Extended ESBL NEGATIVE Sensitive     *  >=100,000 COLONIES/mL KLEBSIELLA PNEUMONIAE         Radiology Studies: No results found.      Scheduled Meds: . chlorhexidine  15 mL Mouth Rinse BID  . dexamethasone  2 mg Oral Daily  . dronabinol  2.5 mg Oral BID AC  . DULoxetine  30 mg Oral Daily  . enoxaparin (LOVENOX) injection  40 mg Subcutaneous Daily  . feeding supplement (ENSURE ENLIVE)  237 mL Oral BID BM  .  fentaNYL  1 patch Transdermal Q72H  . magic mouthwash w/lidocaine  15 mL Oral QID  . mouth rinse  15 mL Mouth Rinse q12n4p  . megestrol  400 mg Oral BID  . multivitamin with minerals  1 tablet Oral Daily  . nystatin  5 mL Oral QID  . pantoprazole (PROTONIX) IV  40 mg Intravenous Q24H  . polyethylene glycol  17 g Oral BID  . senna-docusate  2 tablet Oral BID   Continuous Infusions: . cefTRIAXone (ROCEPHIN)  IV 2 g (09/10/18 0930)  . dextrose 125 mL/hr at 09/10/18 0800  . fluconazole (DIFLUCAN) IV 100 mg (09/10/18 1313)  . potassium PHOSPHATE IVPB (in mmol) 30 mmol (09/10/18 1007)     LOS: 3 days    Time spent: 40 minutes    Irine Seal, MD [facility]  If 7PM-7AM, please contact night-coverage [email or web address] 09/10/2018, 1:32 PM

## 2018-09-11 LAB — CBC WITH DIFFERENTIAL/PLATELET
Abs Immature Granulocytes: 0.02 10*3/uL (ref 0.00–0.07)
Basophils Absolute: 0 10*3/uL (ref 0.0–0.1)
Basophils Relative: 0 %
Eosinophils Absolute: 0 10*3/uL (ref 0.0–0.5)
Eosinophils Relative: 0 %
HCT: 30.9 % — ABNORMAL LOW (ref 39.0–52.0)
Hemoglobin: 9.5 g/dL — ABNORMAL LOW (ref 13.0–17.0)
Immature Granulocytes: 1 %
Lymphocytes Relative: 5 %
Lymphs Abs: 0.1 10*3/uL — ABNORMAL LOW (ref 0.7–4.0)
MCH: 30.4 pg (ref 26.0–34.0)
MCHC: 30.7 g/dL (ref 30.0–36.0)
MCV: 99 fL (ref 80.0–100.0)
Monocytes Absolute: 0.1 10*3/uL (ref 0.1–1.0)
Monocytes Relative: 2 %
Neutro Abs: 2.3 10*3/uL (ref 1.7–7.7)
Neutrophils Relative %: 92 %
Platelets: 67 10*3/uL — ABNORMAL LOW (ref 150–400)
RBC: 3.12 MIL/uL — ABNORMAL LOW (ref 4.22–5.81)
RDW: 17.2 % — ABNORMAL HIGH (ref 11.5–15.5)
WBC: 2.6 10*3/uL — ABNORMAL LOW (ref 4.0–10.5)
nRBC: 0 % (ref 0.0–0.2)

## 2018-09-11 LAB — BASIC METABOLIC PANEL
Anion gap: 6 (ref 5–15)
BUN: 17 mg/dL (ref 6–20)
CO2: 21 mmol/L — ABNORMAL LOW (ref 22–32)
Calcium: 8.8 mg/dL — ABNORMAL LOW (ref 8.9–10.3)
Chloride: 117 mmol/L — ABNORMAL HIGH (ref 98–111)
Creatinine, Ser: 0.78 mg/dL (ref 0.61–1.24)
GFR calc Af Amer: >60 mL/min (ref >60)
GFR calc non Af Amer: >60 mL/min (ref >60)
Glucose, Bld: 115 mg/dL — ABNORMAL HIGH (ref 70–99)
Potassium: 4 mmol/L (ref 3.5–5.1)
Sodium: 144 mmol/L (ref 135–145)

## 2018-09-11 LAB — PHOSPHORUS: Phosphorus: 2.4 mg/dL — ABNORMAL LOW (ref 2.5–4.6)

## 2018-09-11 MED ORDER — FLUCONAZOLE 100 MG PO TABS
100.0000 mg | ORAL_TABLET | Freq: Every day | ORAL | Status: DC
  Administered 2018-09-11 – 2018-09-12 (×2): 100 mg via ORAL
  Filled 2018-09-11 (×2): qty 1

## 2018-09-11 MED ORDER — LEVOFLOXACIN 500 MG PO TABS
500.0000 mg | ORAL_TABLET | Freq: Every day | ORAL | Status: DC
  Administered 2018-09-11 – 2018-09-12 (×2): 500 mg via ORAL
  Filled 2018-09-11 (×3): qty 1

## 2018-09-11 NOTE — Progress Notes (Signed)
Occupational Therapy Treatment Patient Details Name: Rodney [name] MRN: [id number] DOB: 05/03/1970 Today's Date: 09/11/2018    History of present illness Rodney [name] is a 48 y.o. 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  After his last chemotherapy he progressively became more confused and unresponsive.  He was found to have UTI, severe dehydration and acute kidney injury.   OT comments  Pt agreeable to getting up to chair for breakfast. He did not want to try to stand; he reports he feels too weak.  Pt doesn't have clearance during lateral scoots:  Would benefit from drop arm commode at home.  ? If he/fiance want hospital bed. Pt has heavy reliance upon bedrails for OOB   Follow Up Recommendations  Supervision/Assistance - 24 hour;Home health OT;SNF;LTACH    Equipment Recommendations  (drop arm commode, ?hospital bed)    Recommendations for Other Services      Precautions / Restrictions Precautions Precautions: Fall Restrictions Weight Bearing Restrictions: No       Mobility Bed Mobility         Supine to sit: Min guard     General bed mobility comments: heavy reliance on bedrails  Transfers     Transfers: Lateral/Scoot Transfers          Lateral/Scoot Transfers: Min guard General transfer comment: for safety    Balance       Sitting balance - Comments: used bil UE support when sitting                                   ADL either performed or assessed with clinical judgement   ADL   Eating/Feeding: Independent                       Toilet Transfer: Min guard(lateral scoott to chair)             General ADL Comments: pt performed lateral scoot to drop arm recliner. He does not feel strong enough to try to stand.  He would benefit from drop arm commode at home, as he will not be able to perform lateral scoot  to standard commode.  Pt sat EOB for awhile, allowing his body to adjust, and he got up to chair. Able to set himself up for breakfast     Vision       Perception     Praxis      Cognition Arousal/Alertness: Awake/alert Behavior During Therapy: [facility] for tasks assessed/performed                                   General Comments: pt followed commands consistently, oriented.        Exercises     Shoulder Instructions       General Comments      Pertinent Vitals/ Pain       Pain Assessment: Faces Faces Pain Scale: Hurts a little bit Pain Location: sides Pain Descriptors / Indicators: Discomfort Pain Intervention(s): Limited activity within patient's tolerance;Monitored during session;Repositioned  Home Living  Prior Functioning/Environment              Frequency  Min 2X/week        Progress Toward Goals  OT Goals(current goals can now be found in the care plan section)  Progress towards OT goals: Progressing toward goals     Plan      Co-evaluation                 AM-PAC OT "6 Clicks" Daily Activity     Outcome Measure   Help from another person eating meals?: None Help from another person taking care of personal grooming?: A Little Help from another person toileting, which includes using toliet, bedpan, or urinal?: Total Help from another person bathing (including washing, rinsing, drying)?: A Lot Help from another person to put on and taking off regular upper body clothing?: A Lot Help from another person to put on and taking off regular lower body clothing?: Total 6 Click Score: 13    End of Session    OT Visit Diagnosis: Other abnormalities of gait and mobility (R26.89);Muscle weakness (generalized) (M62.81);History of falling (Z91.81);Unsteadiness on feet (R26.81)   Activity Tolerance Patient tolerated treatment well   Patient Left in chair;with call  bell/phone within reach;with chair alarm set   Nurse Communication          Time: [phone] OT Time Calculation (min): 31 min  Charges: OT General Charges $OT Visit: 1 Visit OT Treatments $Therapeutic Activity: 23-37 mins  Lesle Chris, OTR/L Acute Rehabilitation Services [phone] [place] pager [phone] office 09/11/2018   Anon Raices 09/11/2018, 9:33 AM

## 2018-09-11 NOTE — Progress Notes (Signed)
Physical Therapy Treatment Patient Details Name: Rodney [name] MRN: [id number] DOB: 02-15-1970 Today's Date: 09/11/2018    History of Present Illness Rodney FERRALES is a 48 y.o. 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  After his last chemotherapy he progressively became more confused and unresponsive.  He was found to have UTI, severe dehydration and acute kidney injury.    PT Comments    Pt up in chair upon arrival and not ready to go back to bed. Focused session on strengthening he can do while up in chair.  Issued illustrated HEP and some exercises written on his white board.  His fiancee, Rodney [name], present and clarified home equipment. He does already have a hospital bed and does not need that.  He does need a w/c with removable arm rests and adjustable/elevating leg rests. Con't to recommend HHPT with 24/7 A, if family can assist him at his functional level.   Follow Up Recommendations  Home health PT;Supervision/Assistance - 24 hour;SNF     Equipment Recommendations  Wheelchair (measurements PT);Wheelchair cushion (measurements PT)(with removable arm rests and elevating/adjustable leg rests)    Recommendations for Other Services       Precautions / Restrictions Precautions Precautions: Fall Restrictions Weight Bearing Restrictions: No    Mobility  Bed Mobility         Supine to sit: Min guard     General bed mobility comments: Pt in recliner upon arrival  Transfers     Transfers: Lateral/Scoot Transfers          Lateral/Scoot Transfers: Min guard General transfer comment: deferred and worked on exercises  Ambulation/Gait                 Marine scientist [place] (Stroke Patients Only)       Balance       Sitting balance - Comments: used bil UE support when sitting                                     Cognition Arousal/Alertness: Awake/alert Behavior During Therapy: WFL for tasks assessed/performed                                   General Comments: pt followed commands consistently, oriented.      Exercises Total Joint Exercises Towel Squeeze: Strengthening;10 reps;Seated General Exercises - Upper Extremity Chair Push Up: Strengthening;5 reps General Exercises - Lower Extremity Ankle Circles/Pumps: AROM;Both;10 reps Quad Sets: Strengthening;Both;10 reps(reclined) Gluteal Sets: Strengthening;Both;10 reps(reclined) Long Arc Quad: Strengthening;Both;10 reps;Seated Heel Slides: AROM;Both;5 reps(reclined) Hip Flexion/Marching: Strengthening;Both;10 reps;Seated    General Comments General comments (skin integrity, edema, etc.): Discussed home equipment with his Roanna Epley.      Pertinent Vitals/Pain Pain Assessment: Faces Faces Pain Scale: Hurts a little bit Pain Location: L hip  Pain Descriptors / Indicators: Grimacing Pain Intervention(s): Limited activity within patient's tolerance;Monitored during session;Repositioned    Home Living                      Prior Function            PT Goals (current goals can now be found in the care  plan section) Acute Rehab PT Goals Patient Stated Goal: get stronger, return home Progress towards PT goals: Progressing toward goals    Frequency    Min 3X/week      PT Plan Current plan remains appropriate;Equipment recommendations need to be updated    Co-evaluation              AM-PAC PT "6 Clicks" Mobility   Outcome Measure  Help needed turning from your back to your side while in a flat bed without using bedrails?: A Lot Help needed moving from lying on your back to sitting on the side of a flat bed without using bedrails?: A Lot Help needed moving to and from a bed to a chair (including a wheelchair)?: A Little Help needed standing up from a chair using your  arms (e.g., wheelchair or bedside chair)?: A Lot Help needed to walk in hospital room?: Total Help needed climbing 3-5 steps with a railing? : Total 6 Click Score: 11    End of Session   Activity Tolerance: Patient limited by fatigue Patient left: in chair;with call bell/phone within reach;with chair alarm set   PT Visit Diagnosis: Unsteadiness on feet (R26.81);Other abnormalities of gait and mobility (R26.89);Muscle weakness (generalized) (M62.81);Adult, failure to thrive (R62.7)     Time: [id number] PT Time Calculation (min) (ACUTE ONLY): 27 min  Charges:  $Therapeutic Exercise: 23-37 mins                     Lee Kalt L. Tamala Julian, [place] Pager [phone] 09/11/2018    Galen [place] 09/11/2018, 12:11 PM

## 2018-09-11 NOTE — Progress Notes (Signed)
PROGRESS NOTE  Rodney [name] [id number] DOB: [birth year] DOA: 09/06/2018 PCP: Curlene Labrum, MD  Brief History   48 year old metastatic prostate cancer/bladder tumor + cystoprostatectomy-->spine on chemo, reflux, severe opioid-induced constipation Prior hypercalcemia of malignancy, bipolar, reflux   Return to the hospital [id number] metabolic encephalopathy unresponsiveness Found to have severe dehydration AKI hypotension  Found to have sepsis from E. coli and Klebsiella in the setting of chemotherapy-induced neutropenia as well as electrolyte abnormalities, oropharyngeal thrush in addition  Oncology consulted recommended discussion about palliative chemotherapy with poor prognosis in mind  A & P  Sepsis secondary to E. coli, Klebsiella bacteremia Oropharyngeal thrush Received several days of IV ceftriaxone, fluconazole since 8/23 and now more able to take p.o. therefore will transition to p.o. Levaquin/Fluconazole 8/26--monitor trends of CBC in a.m. and reassess-stop date antibiotics 09/22/2018 Long discussion with family about cancer probably causing some immunosuppression and they understand  Resolved AKI, hypernatremia (initial sodium 161) Hypokalemia on admission hypomagnesemia in addition Stop D5 hydration 125 cc/h and repeat labs a.m.-we will force fluids as best possible to keep him well-hydrated Check a.m. mag in addition in a.m.  Metastatic prostate/bladder cancer status post cystoprostatectomy with mets to spine, ribs hypercalcemia of malignancy in the past  pancytopenia and thrombocytopenia-likely secondary to gemcitabine, cisplatin Discussed with Dr. [name] of oncology 8/26 given thrombocytopenia with platelets now down to 60s-we will hold off at this time on giving G-CSF factors Monitor trends in a.m. of labs Continue Decadron 2 mg daily Pain control-continue Dilaudid 2 mg every 4 as needed tablets, fentanyl patch 100 mcg every 72 hours  Metabolic encephalopathy  on admission Much improved and likely secondary to infection and hypernatremia-monitor trends  Cancer related cachexia Continue Marinol 2.5 twice daily, Megace 400 twice daily  Depression Continue Cymbalta 30 daily  DVT prophylaxis: Change Lovenox to SCDs today Code Status: Full CODE STATUS Family Communication: Discussed with fianc at bedside Disposition Plan: Inpatient   Verneita Griffes, MD [facility] 12:32 PM  09/11/2018, 12:32 PM  LOS: 4 days   Consultants  . Discussed with Dr. [name]  Procedures  . None  Antibiotics  . As above  Interval History/Subjective  Awake alert coherent able to verbalize well Eating drinking better than he has been appetite seems to be improved No pain No fever no chills  Objective   Vitals:  Vitals:   09/10/18 2117 09/11/18 0538  BP: (!) 121/94 121/84  Pulse: 79 90  Resp: 17 17  Temp: 98.7 F (37.1 C) 98.8 F (37.1 C)  SpO2: 100% 93%    Exam:  Awake coherent no distress EOMI NCAT no focal deficit moving all 4 limbs Chest clear without added sound S1-S2 no murmur rub or gallop Neck soft supple Moving all 4 limbs without deficit   I have personally reviewed the following:   Today's Data  .   Lab Data  . White count 2.6 up from 2.4 platelet down from 146--97-->67 . Hemoglobin 9.5 . Sodium is 144 down from 148 bicarb is 21 BUNs/creatinine 17/0.7 phosphorus is 2.4 .   Scheduled Meds: . chlorhexidine  15 mL Mouth Rinse BID  . dexamethasone  2 mg Oral Daily  . dronabinol  2.5 mg Oral BID AC  . DULoxetine  30 mg Oral Daily  . enoxaparin (LOVENOX) injection  40 mg Subcutaneous Daily  . feeding supplement (ENSURE ENLIVE)  237 mL Oral BID BM  . fentaNYL  1 patch Transdermal Q72H  . magic mouthwash w/lidocaine  15 mL Oral  QID  . mouth rinse  15 mL Mouth Rinse q12n4p  . megestrol  400 mg Oral BID  . multivitamin with minerals  1 tablet Oral Daily  . nystatin  5 mL Oral QID  . pantoprazole (PROTONIX) IV  40 mg  Intravenous Q24H  . polyethylene glycol  17 g Oral BID  . senna-docusate  2 tablet Oral BID   Continuous Infusions: . cefTRIAXone (ROCEPHIN)  IV 2 g (09/11/18 0945)  . dextrose 125 mL/hr at 09/10/18 2251  . fluconazole (DIFLUCAN) IV 100 mg (09/10/18 1313)    Principal Problem:   Acute lower UTI Active Problems:   Malignant neoplasm of prostate (HCC)   Urothelial cancer ([place])   Spine metastasis ([place])   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 [facility])   UTI (urinary tract infection)   ARF (acute renal failure) (HCC)   Dehydration   Hypernatremia   AMS (altered mental status)   FTT (failure to thrive) in adult   Metabolic acidosis   Acute hypernatremia   Odynophagia   Oral thrush   Hypokalemia   Hypophosphatemia   Acute metabolic encephalopathy   Anemia associated with chemotherapy   Leukopenia due to antineoplastic chemotherapy ([place])   Bacteremia   AKI (acute kidney injury) ([place])   Failure to thrive in adult   Palliative care by specialist   LOS: 4 days   How to contact the [facility] Attending or Consulting provider 7A - 7P or covering provider during after hours 7P -7A, for this patient?  1. Check the care team in [facility] and look for a) attending/consulting TRH provider listed and b) the [facility] team listed 2. Log into [email or web address] and use [place]'s universal password to access. If you do not have the password, please contact the hospital operator. 3. Locate the [facility] provider you are looking for under [facility] and page to a number that you can be directly reached. 4. If you still have difficulty reaching the provider, please page the [facility] (Director on Call) for the Hospitalists listed on amion for assistance.

## 2018-09-12 LAB — CBC WITH DIFFERENTIAL/PLATELET
Abs Immature Granulocytes: 0.03 10*3/uL (ref 0.00–0.07)
Basophils Absolute: 0 10*3/uL (ref 0.0–0.1)
Basophils Relative: 0 %
Eosinophils Absolute: 0 10*3/uL (ref 0.0–0.5)
Eosinophils Relative: 0 %
HCT: 30 % — ABNORMAL LOW (ref 39.0–52.0)
Hemoglobin: 9.3 g/dL — ABNORMAL LOW (ref 13.0–17.0)
Immature Granulocytes: 1 %
Lymphocytes Relative: 5 %
Lymphs Abs: 0.1 10*3/uL — ABNORMAL LOW (ref 0.7–4.0)
MCH: 30.7 pg (ref 26.0–34.0)
MCHC: 31 g/dL (ref 30.0–36.0)
MCV: 99 fL (ref 80.0–100.0)
Monocytes Absolute: 0.1 10*3/uL (ref 0.1–1.0)
Monocytes Relative: 2 %
Neutro Abs: 2.5 10*3/uL (ref 1.7–7.7)
Neutrophils Relative %: 92 %
Platelets: 49 10*3/uL — ABNORMAL LOW (ref 150–400)
RBC: 3.03 MIL/uL — ABNORMAL LOW (ref 4.22–5.81)
RDW: 16.7 % — ABNORMAL HIGH (ref 11.5–15.5)
WBC: 2.7 10*3/uL — ABNORMAL LOW (ref 4.0–10.5)
nRBC: 0.7 % — ABNORMAL HIGH (ref 0.0–0.2)

## 2018-09-12 LAB — COMPREHENSIVE METABOLIC PANEL
ALT: 276 U/L — ABNORMAL HIGH (ref 0–44)
AST: 166 U/L — ABNORMAL HIGH (ref 15–41)
Albumin: 2.1 g/dL — ABNORMAL LOW (ref 3.5–5.0)
Alkaline Phosphatase: 170 U/L — ABNORMAL HIGH (ref 38–126)
Anion gap: 7 (ref 5–15)
BUN: 15 mg/dL (ref 6–20)
CO2: 20 mmol/L — ABNORMAL LOW (ref 22–32)
Calcium: 8.7 mg/dL — ABNORMAL LOW (ref 8.9–10.3)
Chloride: 119 mmol/L — ABNORMAL HIGH (ref 98–111)
Creatinine, Ser: 0.68 mg/dL (ref 0.61–1.24)
GFR calc Af Amer: >60 mL/min (ref >60)
GFR calc non Af Amer: >60 mL/min (ref >60)
Glucose, Bld: 84 mg/dL (ref 70–99)
Potassium: 3.9 mmol/L (ref 3.5–5.1)
Sodium: 146 mmol/L — ABNORMAL HIGH (ref 135–145)
Total Bilirubin: 0.5 mg/dL (ref 0.3–1.2)
Total Protein: 5.7 g/dL — ABNORMAL LOW (ref 6.5–8.1)

## 2018-09-12 NOTE — TOC Progression Note (Signed)
Transition of Care [facility]) - Progression Note    Patient Details  Name: Rodney [name] MRN: [id number] Date of Birth: [birth year]  Transition of Care [facility]) CM/SW [place], LCSW Phone Number: 09/12/2018, 4:35 PM  Clinical Narrative:    DME-elevated commode seat and wheelchair ordered. Delivered to the patient room.      Barriers to Discharge: No Barriers Identified  Expected Discharge Plan and Services  Home          Expected Discharge Date: (unknown)               DME Arranged: Lightweight manual wheelchair with seat cushion, elevated commode-"drop arm commode" DME Agency: [employer] Date DME Agency Contacted: 09/12/18 Time DME Agency Contacted: 1244 Representative spoke with at DME Agency: [place]: RN, PT [place] Agency: [place] ([place])     Representative spoke with at [place]: [place] ([place]) Interventions    Readmission Risk Interventions No flowsheet data found.

## 2018-09-12 NOTE — Progress Notes (Signed)
Patient suffers from lower extremities weakness which impairs their ability to perform daily activities like bathing, dressing, feeding, grooming and toileting in the home.  A walker will not resolve issue with performing activities of daily living. A wheelchair will allow patient to safely perform daily activities. Patient can safely propel the wheelchair in the home or has a caregiver who can provide assistance. Length of need Lifetime. Accessories: elevating leg rests (ELRs), wheel locks, extensions and anti-tippers.

## 2018-09-12 NOTE — Progress Notes (Signed)
OT Cancellation Note  Patient Details Name: Rodney [name] MRN: [id number] DOB: 1970-03-14   Cancelled Treatment:    Reason Eval/Treat Not Completed: Other (comment).  Have checked on pt several times today. He is fatiqued.  Pt thinks he may d/c tomorrow.  If he remains here, we will try to check back Monday.  [place], OTR/L Acute Rehabilitation Services [phone] [facility] pager [phone] office 09/12/2018 09/12/2018, 1:57 PM

## 2018-09-12 NOTE — Progress Notes (Signed)
PROGRESS NOTE  Rodney [name] [id number] DOB: [birth year] DOA: 09/06/2018 PCP: Curlene Labrum, MD  Brief History   48 year old metastatic prostate cancer/bladder tumor + cystoprostatectomy-->spine on chemo, reflux, severe opioid-induced constipation Prior hypercalcemia of malignancy, bipolar, reflux   Return to the hospital 02/09/5807 metabolic encephalopathy unresponsiveness Found to have severe dehydration AKI hypotension  Found to have sepsis from E. coli and Klebsiella in the setting of chemotherapy-induced neutropenia as well as electrolyte abnormalities, oropharyngeal thrush in addition  Oncology consulted recommended discussion about palliative chemotherapy with poor prognosis in mind  Patient has overall stabilized but now has dropping platelet count and rising transaminases so we are monitoring in the hospital overnight to ensure no further worsening otherwise oncology will be formally consulted  A & P  Sepsis secondary to E. coli, Klebsiella bacteremia Oropharyngeal thrush Received several days of IV ceftriaxone, fluconazole since 8/23--> transitioned to Levaquin-discontinued oral fluconazole 8/27 see below discussion continue nystatin as below  Resolved AKI, hypernatremia (initial sodium 161) Hypokalemia on admission hypomagnesemia in addition Forcing fluids recheck labs in the morning [place] but may still need to use tomorrow D5 to prevent hypernatremia  Metastatic prostate/bladder cancer status post cystoprostatectomy with mets to spine, ribs hypercalcemia of malignancy in the past  pancytopenia and thrombocytopenia-likely secondary to gemcitabine, cisplatin Discussed with Dr. [name] of oncology 8/26-thrombocytopenia likely secondary to chemo-no need for G-CSF agents at this time--we need to however keep monitoring as it has bottomed out to the 40 range patient is not a candidate at this juncture for prophylactic Lovenox Continue Decadron 2 mg daily Pain  control-continue Dilaudid 2 mg every 4 as needed tablets, fentanyl patch 100 mcg every 72 hours-pain appears controlled  Metabolic encephalopathy on admission Much improved-in retrospect likely secondary to accumulation of opiate metabolites given patient had AKI on admission May need to convert completely to fentanyl  Transaminitis Fourfold increase in transaminases alk phos normal pointing to probably drug effect therefore discontinued fluconazole 8/27 Recheck LFTs a.m.-if still elevated will need to discuss with oncologist regarding possibility of metastases and further planning  Cancer related cachexia Continue Marinol 2.5 twice daily, Megace 400 twice daily  Depression Continue Cymbalta 30 daily  DVT prophylaxis: Change Lovenox to SCDs today Code Status: Full CODE STATUS Family Communication: Discussed with fianc at bedside on several days Disposition Plan: Inpatient   Verneita Griffes, MD [facility] 1:04 PM  09/12/2018, 1:04 PM  LOS: 5 days   Consultants  . None today  Procedures  . None  Antibiotics  . As above  Interval History/Subjective   Looks and feels well Wants to go home No abdominal pain no fever no chills  Objective   Vitals:  Vitals:   09/11/18 2059 09/12/18 0623  BP: 104/74 120/81  Pulse: 97 91  Resp: 16 16  Temp: 99 F (37.2 C) 98.1 F (36.7 C)  SpO2: 99% 99%    Exam:  Coherent pleasant no distress Chest clear no added sound Abdomen soft No lower extremity edema No right upper quadrant tenderness  I have personally reviewed the following:   Today's Data  .   Lab Data  . White count 2.7 .  platelet down from 146--97-->67-->49 . Hemoglobin 9.6 . Alk phos 170 AST ALT up from 91-->166, ALT 73-->276 . Sodium is 146, chloride 119 BUN/creatinine 15/0.6  Scheduled Meds: . chlorhexidine  15 mL Mouth Rinse BID  . dexamethasone  2 mg Oral Daily  . dronabinol  2.5 mg Oral BID AC  . DULoxetine  30 mg Oral Daily  . feeding  supplement (ENSURE ENLIVE)  237 mL Oral BID BM  . fentaNYL  1 patch Transdermal Q72H  . levofloxacin  500 mg Oral Q1500  . magic mouthwash w/lidocaine  15 mL Oral QID  . mouth rinse  15 mL Mouth Rinse q12n4p  . megestrol  400 mg Oral BID  . multivitamin with minerals  1 tablet Oral Daily  . nystatin  5 mL Oral QID  . pantoprazole (PROTONIX) IV  40 mg Intravenous Q24H  . polyethylene glycol  17 g Oral BID  . senna-docusate  2 tablet Oral BID   Continuous Infusions:   Principal Problem:   Acute lower UTI Active Problems:   Malignant neoplasm of prostate (HCC)   Urothelial cancer (HCC)   Spine metastasis (HCC)   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 [facility])   UTI (urinary tract infection)   ARF (acute renal failure) (HCC)   Dehydration   Hypernatremia   AMS (altered mental status)   FTT (failure to thrive) in adult   Metabolic acidosis   Acute hypernatremia   Odynophagia   Oral thrush   Hypokalemia   Hypophosphatemia   Acute metabolic encephalopathy   Anemia associated with chemotherapy   Leukopenia due to antineoplastic chemotherapy ([place])   Bacteremia   AKI (acute kidney injury) ([place])   Failure to thrive in adult   Palliative care by specialist   LOS: 5 days   How to contact the [facility] Attending or Consulting provider 7A - 7P or covering provider during after hours 7P -7A, for this patient?  1. Check the care team in [facility] and look for a) attending/consulting TRH provider listed and b) the [facility] team listed 2. Log into [email or web address] and use [place]'s universal password to access. If you do not have the password, please contact the hospital operator. 3. Locate the [facility] provider you are looking for under [facility] and page to a number that you can be directly reached. 4. If you still have difficulty reaching the provider, please page the [facility] (Director on Call) for the Hospitalists listed on amion for assistance.

## 2018-09-13 ENCOUNTER — Telehealth: Payer: Self-pay

## 2018-09-13 ENCOUNTER — Other Ambulatory Visit: Payer: Self-pay | Admitting: Oncology

## 2018-09-13 ENCOUNTER — Telehealth: Payer: Self-pay | Admitting: Oncology

## 2018-09-13 LAB — CBC WITH DIFFERENTIAL/PLATELET
Abs Immature Granulocytes: 0.03 10*3/uL (ref 0.00–0.07)
Basophils Absolute: 0 10*3/uL (ref 0.0–0.1)
Basophils Relative: 0 %
Eosinophils Absolute: 0 10*3/uL (ref 0.0–0.5)
Eosinophils Relative: 0 %
HCT: 31 % — ABNORMAL LOW (ref 39.0–52.0)
Hemoglobin: 9.6 g/dL — ABNORMAL LOW (ref 13.0–17.0)
Immature Granulocytes: 1 %
Lymphocytes Relative: 9 %
Lymphs Abs: 0.2 10*3/uL — ABNORMAL LOW (ref 0.7–4.0)
MCH: 30.9 pg (ref 26.0–34.0)
MCHC: 31 g/dL (ref 30.0–36.0)
MCV: 99.7 fL (ref 80.0–100.0)
Monocytes Absolute: 0.1 10*3/uL (ref 0.1–1.0)
Monocytes Relative: 4 %
Neutro Abs: 1.8 10*3/uL (ref 1.7–7.7)
Neutrophils Relative %: 86 %
Platelets: 43 10*3/uL — ABNORMAL LOW (ref 150–400)
RBC: 3.11 MIL/uL — ABNORMAL LOW (ref 4.22–5.81)
RDW: 16.9 % — ABNORMAL HIGH (ref 11.5–15.5)
WBC: 2.1 10*3/uL — ABNORMAL LOW (ref 4.0–10.5)
nRBC: 1 % — ABNORMAL HIGH (ref 0.0–0.2)

## 2018-09-13 LAB — HEPATIC FUNCTION PANEL
ALT: 301 U/L — ABNORMAL HIGH (ref 0–44)
AST: 168 U/L — ABNORMAL HIGH (ref 15–41)
Albumin: 2.2 g/dL — ABNORMAL LOW (ref 3.5–5.0)
Alkaline Phosphatase: 193 U/L — ABNORMAL HIGH (ref 38–126)
Bilirubin, Direct: 0.2 mg/dL (ref 0.0–0.2)
Indirect Bilirubin: 0.3 mg/dL (ref 0.3–0.9)
Total Bilirubin: 0.5 mg/dL (ref 0.3–1.2)
Total Protein: 6 g/dL — ABNORMAL LOW (ref 6.5–8.1)

## 2018-09-13 LAB — BASIC METABOLIC PANEL
Anion gap: 5 (ref 5–15)
BUN: 14 mg/dL (ref 6–20)
CO2: 22 mmol/L (ref 22–32)
Calcium: 9 mg/dL (ref 8.9–10.3)
Chloride: 121 mmol/L — ABNORMAL HIGH (ref 98–111)
Creatinine, Ser: 0.77 mg/dL (ref 0.61–1.24)
GFR calc Af Amer: >60 mL/min (ref >60)
GFR calc non Af Amer: >60 mL/min (ref >60)
Glucose, Bld: 76 mg/dL (ref 70–99)
Potassium: 4 mmol/L (ref 3.5–5.1)
Sodium: 148 mmol/L — ABNORMAL HIGH (ref 135–145)

## 2018-09-13 LAB — PROTIME-INR
INR: 1.2 (ref 0.8–1.2)
Prothrombin Time: 14.9 seconds (ref 11.4–15.2)

## 2018-09-13 MED ORDER — LEVOFLOXACIN 500 MG PO TABS: 500.0000 mg | ORAL_TABLET | Freq: Every day | ORAL | 0 refills | Status: DC

## 2018-09-13 MED ORDER — CHLORHEXIDINE GLUCONATE 0.12 % MT SOLN: 15.0000 mL | Freq: Two times a day (BID) | OROMUCOSAL | 0 refills | Status: DC

## 2018-09-13 MED ORDER — MAGIC MOUTHWASH W/LIDOCAINE: 15.0000 mL | Freq: Four times a day (QID) | ORAL | 0 refills | Status: DC

## 2018-09-13 MED ORDER — NYSTATIN 100000 UNIT/ML MT SUSP: 5.0000 mL | Freq: Four times a day (QID) | OROMUCOSAL | 0 refills | Status: AC

## 2018-09-13 MED ORDER — FENTANYL 100 MCG/HR TD PT72: 1.0000 | MEDICATED_PATCH | TRANSDERMAL | 0 refills | Status: DC

## 2018-09-13 MED ORDER — HEPARIN SOD (PORK) LOCK FLUSH 100 UNIT/ML IV SOLN
500.0000 [IU] | INTRAVENOUS | Status: AC | PRN
  Administered 2018-09-13: 11:00:00 500 [IU]

## 2018-09-13 MED ORDER — DEXAMETHASONE 2 MG PO TABS: 2.0000 mg | ORAL_TABLET | Freq: Every day | ORAL | 0 refills | Status: DC

## 2018-09-13 NOTE — Telephone Encounter (Signed)
Pt's fiance called to clarify pt's infusion appointment schedule for 09/16/18. Clarified to fiance that patient will be receiving IV fluids on 09/16/18 and no chemo treatment, per Dr. Hazeline Junker note on 09/09/18. Per note, patient will then come back on 09/25/18 for lab, MD appointment and next cycle of chemo. Fiance verbalized understanding.

## 2018-09-13 NOTE — Progress Notes (Signed)
Events noted last few days.  Laboratory data from 09/13/2018 were personally reviewed and shows cytopenia that is consistent with chemotherapy and does not require any intervention.  There is no need for any growth factor support or transfusions.  I have no objections to discharge follow-up is already been established for 09/25/2018.

## 2018-09-13 NOTE — Discharge Summary (Signed)
Physician Discharge Summary  Rodney [name] YBW:[id number] DOB: 1970/08/30 DOA: 09/06/2018  PCP: Curlene Labrum, MD  Admit date: 09/06/2018 Discharge date: 09/13/2018  Time spent: 45 minutes  Recommendations for Outpatient Follow-up:  1. Patient prescribed Levaquin, nystatin swish swallow to be completed-treating bacteremia as well as oropharyngeal thrush 2. Needs Chem-12, CBC 1 week-personally discussed with his oncologist Dr. [name] who will arrange as an outpatient lab work in addition to possible IV fluids if needed in the next week I will CC him to close communication loop 3. 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 [facility])   UTI (urinary tract infection)   ARF (acute renal failure) ([facility])   Dehydration   Hypernatremia   AMS (altered mental status)   FTT (failure to thrive) in adult   Metabolic acidosis   Acute hypernatremia   Odynophagia   Oral thrush   Hypokalemia   Hypophosphatemia   Acute metabolic encephalopathy   Anemia associated with chemotherapy   Leukopenia due to antineoplastic chemotherapy ([place])   Bacteremia   AKI (acute kidney injury) ([place])   Failure to thrive in adult   Palliative care by specialist   Discharge Condition: Fair  Diet recommendation: Regular  Filed Weights   09/09/18 0450 09/10/18 0643 09/12/18 0623  Weight: 65.8 kg 81.8 kg 66.2 kg    History of present illness:  48 year old 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 coli and Klebsiella in the setting of chemotherapy-induced neutropenia as well as electrolyte abnormalities, oropharyngeal thrush in addition  Oncology consulted recommended discussion about palliative chemotherapy with poor prognosis in mind--- he is stabilized during hospital course as per below  Hospital Course:  Sepsis secondary to E. coli, Klebsiella bacteremia Oropharyngeal thrush Received several days of IV ceftriaxone, fluconazole since 8/23--> transitioned to Levaquin-discontinued oral fluconazole 8/27 in favor of nystatin-we will complete 14 days total of both medications on discharge prescription called into pharmacy  Resolved AKI, hypernatremia (initial sodium 161) Hypokalemia on admission hypomagnesemia in addition Long discussion with Dr. 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 [name] of oncology 8/26-pancytopenia, transaminitis secondary to gemcitabine and other agents-no further work-up at this stage planned Continue Decadron 2 mg daily-Rx given I have also given a prescription for fentanyl-patient is aware he will need to contact oncologist for further oral pain medications  Metabolic encephalopathy on admission Much improved-in retrospect likely secondary to accumulation of opiate metabolites given patient had AKI on admission May need to convert completely to fentanyl if becomes more confused  Transaminitis Fourfold increase in transaminases alk phos  normal pointing to probably drug effect therefore discontinued fluconazole 8/27 It was  felt that this was likely secondary to gemcitabine after my discussion with oncology-see above discussion  Cancer related cachexia Continue Marinol 2.5 twice daily, Megace 400 twice daily  Depression Continue Cymbalta 30 daily  Consultations:  Oncologist  Discharge Exam: Vitals:   09/12/18 2016 09/13/18 0559  BP: 117/86 113/76  Pulse: 92 86  Resp: 18 18  Temp: 97.8 F (36.6 C) 98.2 F (36.8 C)  SpO2: 100% 99%    General: Awake alert coherent ate double portions for breakfast and looks much better No icterus no pallor no thrush neck soft Cardiovascular: S1-S2 slightly tachycardic sinus rhythm on exam Respiratory: Clinically clear no rales Abdomen soft no rebound no guarding No lower extremity edema Neurologically intact overall looks much improved  Discharge Instructions   Discharge Instructions    Diet - low sodium heart healthy   Complete by: As directed    Discharge instructions   Complete by: As directed    Complete all of your antibiotics and antifungal medications we have prescribed them and send them into your pharmacy [facility] make sure you take all of them You are on a lower dose of steroids Decadron and I have given you 10-day supply-Dr. [name] and you should be in touch with each other I think you would be calling you to set up labs and maybe consider some IV fluids as an outpatient Continue other medications without change including appetite stimulants etc. Please note that controlled substances will be prescribed by your oncologist mainly I will prescribe a couple days of your patches but your other medication should be obtained from your oncologist as discussed Please come back to the emergency room/hospital if you experience severe shortness of breath, confusion, severe pain   Increase activity slowly   Complete by: As directed      Allergies as of 09/13/2018       Reactions   Morphine And Related Itching   Vicodin [hydrocodone-acetaminophen] Itching      Medication List    TAKE these medications   acetaminophen 325 MG tablet Commonly known as: TYLENOL Take 2 tablets (650 mg total) by mouth every 6 (six) hours as needed for mild pain (or Fever >/= 101).   alum & mag hydroxide-simeth 200-200-20 MG/5ML suspension Commonly known as: MAALOX/MYLANTA Take 15 mLs by mouth every 6 (six) hours as needed for indigestion or heartburn.   chlorhexidine 0.12 % solution Commonly known as: PERIDEX 15 mLs by Mouth Rinse route 2 (two) times daily.   cyanocobalamin 1000 MCG/ML injection Commonly known as: (VITAMIN B-12) Inject 1,000 mcg into the muscle every 30 (thirty) days.   dexamethasone 2 MG tablet Commonly known as: DECADRON Take 1 tablet (2 mg total) by mouth daily. Start taking on: September 14, 2018 What changed:   medication strength  how much to take  additional instructions   dronabinol 2.5 MG capsule Commonly known as: MARINOL Take 1 capsule (2.5 mg total) by mouth 2 (two) times daily before a meal.   DULoxetine 30 MG capsule Commonly known as: CYMBALTA Take 1 capsule (30 mg total) by mouth daily.   fentaNYL 100 MCG/HR Commonly known as: [place] 1 patch onto the skin every 3 (three) days. Start taking on: September 15, 2018   HYDROmorphone 2 MG tablet Commonly known as: DILAUDID Take 1 tablet (2 mg total) by mouth every 4 (four) hours as needed for severe pain.   levofloxacin 500 MG tablet Commonly known as: LEVAQUIN Take 1 tablet (500 mg total) by mouth daily  at 3 pm.   lidocaine-prilocaine cream Commonly known as: EMLA Apply 1 application topically as needed.   magic mouthwash w/lidocaine Soln Take 15 mLs by mouth 4 (four) times daily.   megestrol 400 MG/10ML suspension Commonly known as: MEGACE Take 10 mLs (400 mg total) by mouth 2 (two) times daily.   multivitamin with minerals Tabs tablet Take 1 tablet by  mouth daily.   nystatin 100000 UNIT/ML suspension Commonly known as: MYCOSTATIN Take 5 mLs (500,000 Units total) by mouth 4 (four) times daily for 10 days.   ondansetron 4 MG tablet Commonly known as: ZOFRAN Take 1 tablet (4 mg total) by mouth every 6 (six) hours as needed for nausea.   pantoprazole 40 MG tablet Commonly known as: PROTONIX Take 1 tablet (40 mg total) by mouth daily.   polyethylene glycol 17 g packet Commonly known as: MIRALAX / GLYCOLAX Take 17 g by mouth 2 (two) times daily.   senna-docusate 8.6-50 MG tablet Commonly known as: Senokot-S Take 2 tablets by mouth 2 (two) times daily.            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  A walker will not resolve issue with performing activities of daily living. A wheelchair will allow patient to safely perform daily activities. Patient can safely propel the wheelchair in the home or has a caregiver who can provide assistance. Length of need Lifetime. Accessories: elevating leg rests (ELRs), wheel locks, extensions and anti-tippers.   09/12/18 1114   09/11/18 1232  For home use only DME Eelevated commode seat  Once    Comments: Drop-arm commode   09/11/18 1232         Allergies  Allergen Reactions  . Morphine And Related Itching  . Vicodin [Hydrocodone-Acetaminophen] Itching      The results of significant diagnostics from this hospitalization (including imaging, microbiology, ancillary and laboratory) are listed below for reference.    Significant Diagnostic Studies: Dg Chest 2 View  Result Date: 09/06/2018 CLINICAL DATA:  Suspected sepsis. EXAM: CHEST - 2 VIEW COMPARISON:  Radiographs and  CT 08/02/2018 FINDINGS: Low lung volumes. Right chest port remains in place with tip in the SVC. Normal heart size. Unchanged mediastinal contours. Streaky atelectasis in both lung bases. No pulmonary edema, large pleural effusion or pneumothorax. Numerous osseous lesions including the ribs, spine, and sternum, better appreciated on prior CT. IMPRESSION: Low lung volumes with bibasilar atelectasis. Osseous metastatic disease as characterized on prior CT. Electronically Signed   By: Keith Rake M.D.   On: 09/06/2018 22:34   Ct Head Wo Contrast  Result Date: 09/07/2018 CLINICAL DATA:  Altered mental status (AMS), unclear cause. History of metastatic bladder/prostate cancer. EXAM: CT HEAD WITHOUT CONTRAST TECHNIQUE: Contiguous axial images were obtained from the base of the skull through the vertex without intravenous contrast. COMPARISON:  Head CT 03/06/2010 FINDINGS: Brain: No evidence of acute infarction, hemorrhage, hydrocephalus, extra-axial collection or mass lesion/mass effect. Generalized atrophy which is age advanced. Periventricular white matter changes most prominent in the bifrontal region, progressed from 2012 CT. Vascular: No hyperdense vessel. Skull: Arachnoid granulations in the left frontal bone, unchanged from remote CT. No  fracture or focal lesion. Sinuses/Orbits: Paranasal sinuses and mastoid air cells are clear. The visualized orbits are unremarkable. Other: None. IMPRESSION: 1. No acute intracranial abnormality. 2. Generalized atrophy, prominent for age. Periventricular white matter changes nonspecific, most typically chronic small vessel ischemia, age advanced and progressed from 2012. Electronically Signed   By: Keith Rake M.D.   On: 09/07/2018 00:53    Microbiology: Recent Results (from the past 240 hour(s))  Culture, blood (Routine x 2)     Status: Abnormal   Collection Time: 09/06/18 10:01 PM   Specimen: BLOOD  Result Value Ref Range Status   Specimen Description    Final    BLOOD PORTA CATH Performed at [facility] [address], [place], [place] [address]    Special Requests   Final    BOTTLES DRAWN AEROBIC AND ANAEROBIC Blood Culture adequate volume Performed at [facility] [address], [place], [place] [address]    Culture  Setup Time   Final    GRAM NEGATIVE RODS IN BOTH AEROBIC AND ANAEROBIC BOTTLES CRITICAL RESULT CALLED TO, READ BACK BY AND VERIFIED WITH: Otila Back [id number] MLM Performed at [facility], [place] [address], [place], [place] [address]    Culture KLEBSIELLA PNEUMONIAE (A)  Final   Report Status 09/10/2018 FINAL  Final   Organism ID, Bacteria KLEBSIELLA PNEUMONIAE  Final      Susceptibility   Klebsiella pneumoniae - MIC*    AMPICILLIN >=32 RESISTANT Resistant     CEFAZOLIN <=4 SENSITIVE Sensitive     CEFEPIME <=1 SENSITIVE Sensitive     CEFTAZIDIME <=1 SENSITIVE Sensitive     CEFTRIAXONE <=1 SENSITIVE Sensitive     CIPROFLOXACIN <=0.25 SENSITIVE Sensitive     GENTAMICIN <=1 SENSITIVE Sensitive     IMIPENEM <=0.25 SENSITIVE Sensitive     TRIMETH/SULFA <=20 SENSITIVE Sensitive     AMPICILLIN/SULBACTAM 8 SENSITIVE Sensitive     PIP/TAZO <=4 SENSITIVE Sensitive     Extended ESBL NEGATIVE Sensitive     * KLEBSIELLA PNEUMONIAE  Blood Culture ID Panel (Reflexed)     Status: Abnormal   Collection Time: 09/06/18 10:01 PM  Result Value Ref Range Status   Enterococcus species NOT DETECTED NOT DETECTED Final   Listeria monocytogenes NOT DETECTED NOT DETECTED Final   Staphylococcus species NOT DETECTED NOT DETECTED Final   Staphylococcus aureus (BCID) NOT DETECTED NOT DETECTED Final   Streptococcus species NOT DETECTED NOT DETECTED Final   Streptococcus agalactiae NOT DETECTED NOT DETECTED Final   Streptococcus pneumoniae NOT DETECTED NOT DETECTED Final   Streptococcus pyogenes NOT DETECTED NOT DETECTED Final   Acinetobacter baumannii NOT DETECTED NOT DETECTED Final    Enterobacteriaceae species DETECTED (A) NOT DETECTED Final    Comment: Enterobacteriaceae represent a large family of gram-negative bacteria, not a single organism. CRITICAL RESULT CALLED TO, READ BACK BY AND VERIFIED WITH: PHARMD J LEGGE [phone] MLM    Enterobacter cloacae complex NOT DETECTED NOT DETECTED Final   Escherichia coli NOT DETECTED NOT DETECTED Final   Klebsiella oxytoca NOT DETECTED NOT DETECTED Final   Klebsiella pneumoniae DETECTED (A) NOT DETECTED Final    Comment: CRITICAL RESULT CALLED TO, READ BACK BY AND VERIFIED WITH: PHARMD J LEGGE [id number] MLM    Proteus species NOT DETECTED NOT DETECTED Final   Serratia marcescens NOT DETECTED NOT DETECTED Final   Carbapenem resistance NOT DETECTED NOT DETECTED Final   Haemophilus influenzae NOT DETECTED NOT DETECTED Final  Neisseria meningitidis NOT DETECTED NOT DETECTED Final   Pseudomonas aeruginosa NOT DETECTED NOT DETECTED Final   Candida albicans NOT DETECTED NOT DETECTED Final   Candida glabrata NOT DETECTED NOT DETECTED Final   Candida krusei NOT DETECTED NOT DETECTED Final   Candida parapsilosis NOT DETECTED NOT DETECTED Final   Candida tropicalis NOT DETECTED NOT DETECTED Final    Comment: Performed at [facility], [place] [address], [place], [place] [address]  Culture, blood (Routine x 2)     Status: Abnormal   Collection Time: 09/06/18 10:06 PM   Specimen: BLOOD  Result Value Ref Range Status   Specimen Description   Final    BLOOD RIGHT ANTECUBITAL Performed at [facility] [address], [place], [place] [address]    Special Requests   Final    BOTTLES DRAWN AEROBIC AND ANAEROBIC Blood Culture adequate volume Performed at [facility] [address] [place], [place] [address]    Culture  Setup Time   Final    AEROBIC BOTTLE ONLY GRAM POSITIVE COCCI ANAEROBIC BOTTLE ONLY GRAM NEGATIVE RODS CRITICAL RESULT CALLED TO, READ BACK BY AND VERIFIED WITH: [facility] @ 7048 ON 09/08/18 BY ROBINSON Z.     Culture (A)  Final    KLEBSIELLA PNEUMONIAE SUSCEPTIBILITIES PERFORMED ON PREVIOUS CULTURE WITHIN THE LAST 5 DAYS. STAPHYLOCOCCUS SPECIES (COAGULASE NEGATIVE) THE SIGNIFICANCE OF ISOLATING THIS ORGANISM FROM A SINGLE SET OF BLOOD CULTURES WHEN MULTIPLE SETS ARE DRAWN IS UNCERTAIN. PLEASE NOTIFY THE MICROBIOLOGY DEPARTMENT WITHIN ONE WEEK IF SPECIATION AND SENSITIVITIES ARE REQUIRED. Performed at [facility], [place] [address], [place], [place] [address]    Report Status 09/10/2018 FINAL  Final  Blood Culture ID Panel (Reflexed)     Status: Abnormal   Collection Time: 09/06/18 10:06 PM  Result Value Ref Range Status   Enterococcus species NOT DETECTED NOT DETECTED Final   Listeria monocytogenes NOT DETECTED NOT DETECTED Final   Staphylococcus species DETECTED (A) NOT DETECTED Final    Comment: Methicillin (oxacillin) susceptible coagulase negative staphylococcus. Possible blood culture contaminant (unless isolated from more than one blood culture draw or clinical case suggests pathogenicity). No antibiotic treatment is indicated for blood  culture contaminants. CRITICAL RESULT CALLED TO, READ BACK BY AND VERIFIED WITH: [facility] @ 0527 ON 09/08/18 BY ROBINSON Z.    Staphylococcus aureus (BCID) NOT DETECTED NOT DETECTED Final   Methicillin resistance NOT DETECTED NOT DETECTED Final   Streptococcus species NOT DETECTED NOT DETECTED Final   Streptococcus agalactiae NOT DETECTED NOT DETECTED Final   Streptococcus pneumoniae NOT DETECTED NOT DETECTED Final   Streptococcus pyogenes NOT DETECTED NOT DETECTED Final   Acinetobacter baumannii NOT DETECTED NOT DETECTED Final   Enterobacteriaceae species NOT DETECTED NOT DETECTED Final   Enterobacter cloacae complex NOT DETECTED NOT DETECTED Final   Escherichia coli NOT DETECTED NOT DETECTED Final   Klebsiella oxytoca NOT DETECTED NOT DETECTED Final   Klebsiella pneumoniae NOT DETECTED  NOT DETECTED Final   Proteus species NOT DETECTED NOT DETECTED Final   Serratia marcescens NOT DETECTED NOT DETECTED Final   Haemophilus influenzae NOT DETECTED NOT DETECTED Final   Neisseria meningitidis NOT DETECTED NOT DETECTED Final   Pseudomonas aeruginosa NOT DETECTED NOT DETECTED Final   Candida albicans NOT DETECTED NOT DETECTED Final   Candida glabrata NOT DETECTED NOT DETECTED Final   Candida krusei NOT DETECTED NOT DETECTED Final   Candida parapsilosis NOT DETECTED NOT DETECTED Final  Candida tropicalis NOT DETECTED NOT DETECTED Final    Comment: Performed at [facility], [place] [address], [place], [place] [address]  SARS Coronavirus 2 [facility] order, Performed in [facility] hospital lab) Nasopharyngeal Nasopharyngeal Swab     Status: None   Collection Time: 09/07/18  3:48 AM   Specimen: Nasopharyngeal Swab  Result Value Ref Range Status   SARS Coronavirus 2 NEGATIVE NEGATIVE Final    Comment: (NOTE) If result is NEGATIVE SARS-CoV-2 target nucleic acids are NOT DETECTED. The SARS-CoV-2 RNA is generally detectable in upper and lower  respiratory specimens during the acute phase of infection. The lowest  concentration of SARS-CoV-2 viral copies this assay can detect is 250  copies / mL. A negative result does not preclude SARS-CoV-2 infection  and should not be used as the sole basis for treatment or other  patient management decisions.  A negative result may occur with  improper specimen collection / handling, submission of specimen other  than nasopharyngeal swab, presence of viral mutation(s) within the  areas targeted by this assay, and inadequate number of viral copies  (<250 copies / mL). A negative result must be combined with clinical  observations, patient history, and epidemiological information. If result is POSITIVE SARS-CoV-2 target nucleic acids are DETECTED. The SARS-CoV-2 RNA is generally detectable in upper and lower  respiratory specimens dur ing  the acute phase of infection.  Positive  results are indicative of active infection with SARS-CoV-2.  Clinical  correlation with patient history and other diagnostic information is  necessary to determine patient infection status.  Positive results do  not rule out bacterial infection or co-infection with other viruses. If result is PRESUMPTIVE POSTIVE SARS-CoV-2 nucleic acids MAY BE PRESENT.   A presumptive positive result was obtained on the submitted specimen  and confirmed on repeat testing.  While 2019 novel coronavirus  (SARS-CoV-2) nucleic acids may be present in the submitted sample  additional confirmatory testing may be necessary for epidemiological  and / or clinical management purposes  to differentiate between  SARS-CoV-2 and other Sarbecovirus currently known to infect humans.  If clinically indicated additional testing with an alternate test  methodology [phone]) is advised. The SARS-CoV-2 RNA is generally  detectable in upper and lower respiratory sp ecimens during the acute  phase of infection. The expected result is Negative. Fact Sheet for Patients:  StrictlyIdeas.no Fact Sheet for Healthcare Providers: BankingDealers.co.za This test is not yet approved or cleared by the [place] FDA and has been authorized for detection and/or diagnosis of SARS-CoV-2 by FDA under an Emergency Use Authorization (EUA).  This EUA will remain in effect (meaning this test can be used) for the duration of the COVID-19 declaration under Section 564(b)(1) of the Act, 21 U.S.C. section 360bbb-3(b)(1), unless the authorization is terminated or revoked sooner. Performed at [facility], [place] [address], [place], [place] [address]   Urine culture     Status: Abnormal   Collection Time: 09/07/18  3:50 AM   Specimen: Urine, Random  Result Value Ref Range Status   Specimen Description   Final    URINE, RANDOM Performed at  [facility] [address], [place], [place] [address]    Special Requests   Final    NONE Performed at [facility], [place] [address], [place], [place] [address]    Culture (A)  Final    >=100,000 COLONIES/mL ESCHERICHIA COLI >=100,000 COLONIES/mL KLEBSIELLA PNEUMONIAE    Report Status 09/10/2018  FINAL  Final   Organism ID, Bacteria ESCHERICHIA COLI (A)  Final   Organism ID, Bacteria KLEBSIELLA PNEUMONIAE (A)  Final      Susceptibility   Escherichia coli - MIC*    AMPICILLIN <=2 SENSITIVE Sensitive     CEFAZOLIN <=4 SENSITIVE Sensitive     CEFTRIAXONE <=1 SENSITIVE Sensitive     CIPROFLOXACIN <=0.25 SENSITIVE Sensitive     GENTAMICIN <=1 SENSITIVE Sensitive     IMIPENEM <=0.25 SENSITIVE Sensitive     NITROFURANTOIN <=16 SENSITIVE Sensitive     TRIMETH/SULFA <=20 SENSITIVE Sensitive     AMPICILLIN/SULBACTAM <=2 SENSITIVE Sensitive     PIP/TAZO <=4 SENSITIVE Sensitive     Extended ESBL NEGATIVE Sensitive     * >=100,000 COLONIES/mL ESCHERICHIA COLI   Klebsiella pneumoniae - MIC*    AMPICILLIN >=32 RESISTANT Resistant     CEFAZOLIN <=4 SENSITIVE Sensitive     CEFTRIAXONE <=1 SENSITIVE Sensitive     CIPROFLOXACIN <=0.25 SENSITIVE Sensitive     GENTAMICIN <=1 SENSITIVE Sensitive     IMIPENEM <=0.25 SENSITIVE Sensitive     NITROFURANTOIN 64 INTERMEDIATE Intermediate     TRIMETH/SULFA <=20 SENSITIVE Sensitive     AMPICILLIN/SULBACTAM 8 SENSITIVE Sensitive     PIP/TAZO <=4 SENSITIVE Sensitive     Extended ESBL NEGATIVE Sensitive     * >=100,000 COLONIES/mL KLEBSIELLA PNEUMONIAE     Labs: Basic Metabolic Panel: Recent Labs  Lab 09/07/18 0843 09/08/18 0332 09/09/18 0424  09/10/18 0413 09/10/18 1429 09/11/18 0348 09/12/18 0439 09/13/18 0432  NA 154* 158* 161*  161*   < > 149* 148* 144 146* 148*  K 3.8 2.7* 3.6  3.6   < > 4.0 4.3 4.0 3.9 4.0  CL >130* 128* 129*  129*   < > 122* 119* 117* 119* 121*  CO2 10* 23 23  24    < > 24 22  21* 20* 22  GLUCOSE 176* 190* 113*  112*   < > 116* 123* 115* 84 76  BUN 83* 65* 48*  49*   < > 28* 19 17 15 14   CREATININE 1.29* 1.21 1.09  1.10   < > 0.95 0.91 0.78 0.68 0.77  CALCIUM 9.4 8.6* 9.0  8.9   < > 8.9 8.6* 8.8* 8.7* 9.0  MG 2.6* 2.4  --   --  2.2  --   --   --   --   PHOS 3.5 1.9* 3.3  --  1.8*  --  2.4*  --   --    < > = values in this interval not displayed.   Liver Function Tests: Recent Labs  Lab 09/06/18 2201  09/09/18 0424 09/09/18 0926 09/10/18 0413 09/12/18 0439 09/13/18 0432  AST 30  --   --  37 91* 166* 168*  ALT 39  --   --  30 73* 276* 301*  ALKPHOS 224*  --   --  136* 145* 170* 193*  BILITOT 0.9  --   --  0.4 0.8 0.5 0.5  PROT 7.1  --   --  5.4* 5.5* 5.7* 6.0*  ALBUMIN 2.9*   < > 2.0* 2.0* 2.1* 2.1* 2.2*   < > = values in this interval not displayed.   No results for input(s): LIPASE, AMYLASE in the last 168 hours. No results for input(s): AMMONIA in the last 168 hours. CBC: Recent Labs  Lab 09/09/18 0424 09/10/18 0413 09/11/18 0348 09/12/18 0439 09/13/18 0432  WBC 2.9* 2.4* 2.6* 2.7* 2.1*  NEUTROABS 2.7 2.2 2.3 2.5 1.8  HGB 8.9* 9.0* 9.5* 9.3* 9.6*  HCT 28.0* 29.1* 30.9* 30.0* 31.0*  MCV 97.9 99.3 99.0 99.0 99.7  PLT 146* 97* 67* 49* 43*   Cardiac Enzymes: No results for input(s): CKTOTAL, CKMB, CKMBINDEX, TROPONINI in the last 168 hours. BNP: BNP (last 3 results) No results for input(s): BNP in the last 8760 hours.  ProBNP (last 3 results) No results for input(s): PROBNP in the last 8760 hours.  CBG: No results for input(s): GLUCAP in the last 168 hours.     Signed:  Nita Sells MD   [facility] 09/13/2018, 10:06 AM

## 2018-09-13 NOTE — Plan of Care (Signed)

## 2018-09-13 NOTE — Telephone Encounter (Signed)
Scheduled appt per 8/28 sch message - pt is aware of appt date and time

## 2018-09-13 NOTE — Progress Notes (Signed)
Rodney [name] to be D/C'd Home per MD order.  Discussed prescriptions and follow up appointments with the patient. Prescriptions given to patient, medication list explained in detail. Pt verbalized understanding.  Allergies as of 09/13/2018      Reactions   Morphine And Related Itching   Vicodin [hydrocodone-acetaminophen] Itching      Medication List    TAKE these medications   acetaminophen 325 MG tablet Commonly known as: TYLENOL Take 2 tablets (650 mg total) by mouth every 6 (six) hours as needed for mild pain (or Fever >/= 101).   alum & mag hydroxide-simeth 200-200-20 MG/5ML suspension Commonly known as: MAALOX/MYLANTA Take 15 mLs by mouth every 6 (six) hours as needed for indigestion or heartburn.   chlorhexidine 0.12 % solution Commonly known as: PERIDEX 15 mLs by Mouth Rinse route 2 (two) times daily.   cyanocobalamin 1000 MCG/ML injection Commonly known as: (VITAMIN B-12) Inject 1,000 mcg into the muscle every 30 (thirty) days.   dexamethasone 2 MG tablet Commonly known as: DECADRON Take 1 tablet (2 mg total) by mouth daily. Start taking on: September 14, 2018 What changed:   medication strength  how much to take  additional instructions   dronabinol 2.5 MG capsule Commonly known as: MARINOL Take 1 capsule (2.5 mg total) by mouth 2 (two) times daily before a meal.   DULoxetine 30 MG capsule Commonly known as: CYMBALTA Take 1 capsule (30 mg total) by mouth daily.   fentaNYL 100 MCG/HR Commonly known as: [place]in 1 patch onto the skin every 3 (three) days. Start taking on: September 15, 2018   HYDROmorphone 2 MG tablet Commonly known as: DILAUDID Take 1 tablet (2 mg total) by mouth every 4 (four) hours as needed for severe pain.   levofloxacin 500 MG tablet Commonly known as: LEVAQUIN Take 1 tablet (500 mg total) by mouth daily at 3 pm.   lidocaine-prilocaine cream Commonly known as: EMLA Apply 1 application topically as needed.   magic  mouthwash w/lidocaine Soln Take 15 mLs by mouth 4 (four) times daily.   megestrol 400 MG/10ML suspension Commonly known as: MEGACE Take 10 mLs (400 mg total) by mouth 2 (two) times daily.   multivitamin with minerals Tabs tablet Take 1 tablet by mouth daily.   nystatin 100000 UNIT/ML suspension Commonly known as: MYCOSTATIN Take 5 mLs (500,000 Units total) by mouth 4 (four) times daily for 10 days.   ondansetron 4 MG tablet Commonly known as: ZOFRAN Take 1 tablet (4 mg total) by mouth every 6 (six) hours as needed for nausea.   pantoprazole 40 MG tablet Commonly known as: PROTONIX Take 1 tablet (40 mg total) by mouth daily.   polyethylene glycol 17 g packet Commonly known as: MIRALAX / GLYCOLAX Take 17 g by mouth 2 (two) times daily.   senna-docusate 8.6-50 MG tablet Commonly known as: Senokot-S Take 2 tablets by mouth 2 (two) times daily.            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  A walker will not resolve issue with performing activities of daily living. A wheelchair will allow patient to safely perform daily  activities. Patient can safely propel the wheelchair in the home or has a caregiver who can provide assistance. Length of need Lifetime. Accessories: elevating leg rests (ELRs), wheel locks, extensions and anti-tippers.   09/12/18 1114   09/11/18 1232  For home use only DME Eelevated commode seat  Once    Comments: Drop-arm commode   09/11/18 1232          Vitals:   09/12/18 2016 09/13/18 0559  BP: 117/86 113/76  Pulse: 92 86  Resp: 18 18  Temp: 97.8 F (36.6 C) 98.2 F (36.8 C)  SpO2: 100% 99%    Skin clean, dry and intact  without evidence of skin break down, no evidence of skin tears noted. IV catheter discontinued intact. Site without signs and symptoms of complications. Dressing and pressure applied. Pt denies pain at this time. No complaints noted.  An After Visit Summary was printed and given to the patient. Patient escorted via [place], and D/C home via private auto.  Lolita Rieger 09/13/2018 10:52 AM

## 2018-09-16 ENCOUNTER — Inpatient Hospital Stay: Payer: PRIVATE HEALTH INSURANCE

## 2018-09-16 ENCOUNTER — Other Ambulatory Visit: Payer: Self-pay

## 2018-09-16 VITALS — BP 114/83 | HR 99 | Temp 99.1°F | Resp 18

## 2018-09-16 DIAGNOSIS — Z95828 Presence of other vascular implants and grafts: Secondary | ICD-10-CM

## 2018-09-16 DIAGNOSIS — Z5111 Encounter for antineoplastic chemotherapy: Secondary | ICD-10-CM | POA: Diagnosis not present

## 2018-09-16 DIAGNOSIS — C7951 Secondary malignant neoplasm of bone: Secondary | ICD-10-CM

## 2018-09-16 MED ORDER — HEPARIN SOD (PORK) LOCK FLUSH 100 UNIT/ML IV SOLN
500.0000 [IU] | Freq: Once | INTRAVENOUS | Status: AC
  Administered 2018-09-16: 500 [IU]
  Filled 2018-09-16: qty 5

## 2018-09-16 MED ORDER — SODIUM CHLORIDE 0.9 % IV SOLN
Freq: Once | INTRAVENOUS | Status: AC
  Administered 2018-09-16: 15:00:00 via INTRAVENOUS
  Filled 2018-09-16: qty 250

## 2018-09-16 MED ORDER — SODIUM CHLORIDE 0.9% FLUSH
10.0000 mL | Freq: Once | INTRAVENOUS | Status: AC
  Administered 2018-09-16: 10 mL
  Filled 2018-09-16: qty 10

## 2018-09-16 NOTE — Patient Instructions (Signed)

## 2018-09-17 ENCOUNTER — Other Ambulatory Visit: Payer: Self-pay | Admitting: Oncology

## 2018-09-18 ENCOUNTER — Other Ambulatory Visit: Payer: Self-pay | Admitting: Oncology

## 2018-09-25 ENCOUNTER — Other Ambulatory Visit: Payer: Self-pay

## 2018-09-25 ENCOUNTER — Inpatient Hospital Stay: Payer: PRIVATE HEALTH INSURANCE

## 2018-09-25 ENCOUNTER — Inpatient Hospital Stay: Payer: PRIVATE HEALTH INSURANCE | Attending: Oncology | Admitting: Oncology

## 2018-09-25 VITALS — BP 103/74 | HR 106 | Temp 98.3°F | Resp 18 | Wt 145.0 lb

## 2018-09-25 DIAGNOSIS — C801 Malignant (primary) neoplasm, unspecified: Secondary | ICD-10-CM

## 2018-09-25 DIAGNOSIS — C61 Malignant neoplasm of prostate: Secondary | ICD-10-CM | POA: Diagnosis not present

## 2018-09-25 DIAGNOSIS — Z95828 Presence of other vascular implants and grafts: Secondary | ICD-10-CM

## 2018-09-25 DIAGNOSIS — Z79899 Other long term (current) drug therapy: Secondary | ICD-10-CM | POA: Insufficient documentation

## 2018-09-25 DIAGNOSIS — C679 Malignant neoplasm of bladder, unspecified: Secondary | ICD-10-CM | POA: Insufficient documentation

## 2018-09-25 DIAGNOSIS — Z923 Personal history of irradiation: Secondary | ICD-10-CM | POA: Insufficient documentation

## 2018-09-25 DIAGNOSIS — Z9079 Acquired absence of other genital organ(s): Secondary | ICD-10-CM | POA: Diagnosis not present

## 2018-09-25 DIAGNOSIS — C7951 Secondary malignant neoplasm of bone: Secondary | ICD-10-CM

## 2018-09-25 DIAGNOSIS — Z5111 Encounter for antineoplastic chemotherapy: Secondary | ICD-10-CM | POA: Diagnosis present

## 2018-09-25 DIAGNOSIS — C787 Secondary malignant neoplasm of liver and intrahepatic bile duct: Secondary | ICD-10-CM | POA: Insufficient documentation

## 2018-09-25 LAB — CBC WITH DIFFERENTIAL (CANCER CENTER ONLY)
Abs Immature Granulocytes: 1.59 10*3/uL — ABNORMAL HIGH (ref 0.00–0.07)
Basophils Absolute: 0 10*3/uL (ref 0.0–0.1)
Basophils Relative: 1 %
Eosinophils Absolute: 0 10*3/uL (ref 0.0–0.5)
Eosinophils Relative: 0 %
HCT: 28.3 % — ABNORMAL LOW (ref 39.0–52.0)
Hemoglobin: 9.2 g/dL — ABNORMAL LOW (ref 13.0–17.0)
Immature Granulocytes: 19 %
Lymphocytes Relative: 16 %
Lymphs Abs: 1.3 10*3/uL (ref 0.7–4.0)
MCH: 31.4 pg (ref 26.0–34.0)
MCHC: 32.5 g/dL (ref 30.0–36.0)
MCV: 96.6 fL (ref 80.0–100.0)
Monocytes Absolute: 1.7 10*3/uL — ABNORMAL HIGH (ref 0.1–1.0)
Monocytes Relative: 21 %
Neutro Abs: 3.7 10*3/uL (ref 1.7–7.7)
Neutrophils Relative %: 43 %
Platelet Count: 383 10*3/uL (ref 150–400)
RBC: 2.93 MIL/uL — ABNORMAL LOW (ref 4.22–5.81)
RDW: 19.9 % — ABNORMAL HIGH (ref 11.5–15.5)
WBC Count: 8.4 10*3/uL (ref 4.0–10.5)
nRBC: 2.3 % — ABNORMAL HIGH (ref 0.0–0.2)

## 2018-09-25 LAB — CMP (CANCER CENTER ONLY)
ALT: 27 U/L (ref 0–44)
AST: 24 U/L (ref 15–41)
Albumin: 2.6 g/dL — ABNORMAL LOW (ref 3.5–5.0)
Alkaline Phosphatase: 291 U/L — ABNORMAL HIGH (ref 38–126)
Anion gap: 8 (ref 5–15)
BUN: 11 mg/dL (ref 6–20)
CO2: 21 mmol/L — ABNORMAL LOW (ref 22–32)
Calcium: 9 mg/dL (ref 8.9–10.3)
Chloride: 111 mmol/L (ref 98–111)
Creatinine: 0.69 mg/dL (ref 0.61–1.24)
GFR, Est AFR Am: >60 mL/min (ref >60)
GFR, Estimated: >60 mL/min (ref >60)
Glucose, Bld: 95 mg/dL (ref 70–99)
Potassium: 3.9 mmol/L (ref 3.5–5.1)
Sodium: 140 mmol/L (ref 135–145)
Total Bilirubin: 0.2 mg/dL — ABNORMAL LOW (ref 0.3–1.2)
Total Protein: 6 g/dL — ABNORMAL LOW (ref 6.5–8.1)

## 2018-09-25 MED ORDER — MAGIC MOUTHWASH W/LIDOCAINE: 15.0000 mL | Freq: Four times a day (QID) | ORAL | 0 refills | Status: DC

## 2018-09-25 MED ORDER — SODIUM CHLORIDE 0.9 % IV SOLN
Freq: Once | INTRAVENOUS | Status: AC
  Administered 2018-09-25: 10:00:00 via INTRAVENOUS
  Filled 2018-09-25: qty 250

## 2018-09-25 MED ORDER — PALONOSETRON HCL INJECTION 0.25 MG/5ML
INTRAVENOUS | Status: AC
  Filled 2018-09-25: qty 5

## 2018-09-25 MED ORDER — SODIUM CHLORIDE 0.9% FLUSH
10.0000 mL | INTRAVENOUS | Status: DC | PRN
  Administered 2018-09-25: 10 mL
  Filled 2018-09-25: qty 10

## 2018-09-25 MED ORDER — HEPARIN SOD (PORK) LOCK FLUSH 100 UNIT/ML IV SOLN
500.0000 [IU] | Freq: Once | INTRAVENOUS | Status: AC | PRN
  Administered 2018-09-25: 500 [IU]
  Filled 2018-09-25: qty 5

## 2018-09-25 MED ORDER — PROCHLORPERAZINE MALEATE 10 MG PO TABS: 10.0000 mg | ORAL_TABLET | Freq: Four times a day (QID) | ORAL | 0 refills | Status: DC | PRN

## 2018-09-25 MED ORDER — SODIUM CHLORIDE 0.9 % IV SOLN
639.0000 mg | Freq: Once | INTRAVENOUS | Status: AC
  Administered 2018-09-25: 12:00:00 640 mg via INTRAVENOUS
  Filled 2018-09-25: qty 64

## 2018-09-25 MED ORDER — SODIUM CHLORIDE 0.9 % IV SOLN
Freq: Once | INTRAVENOUS | Status: DC
  Filled 2018-09-25: qty 250

## 2018-09-25 MED ORDER — SODIUM CHLORIDE 0.9 % IV SOLN
Freq: Once | INTRAVENOUS | Status: AC
  Administered 2018-09-25: 11:00:00 via INTRAVENOUS
  Filled 2018-09-25: qty 5

## 2018-09-25 MED ORDER — SODIUM CHLORIDE 0.9% FLUSH
10.0000 mL | Freq: Once | INTRAVENOUS | Status: AC | PRN
  Administered 2018-09-25: 09:00:00 10 mL
  Filled 2018-09-25: qty 10

## 2018-09-25 MED ORDER — SODIUM CHLORIDE 0.9 % IV SOLN
1000.0000 mg/m2 | Freq: Once | INTRAVENOUS | Status: AC
  Administered 2018-09-25: 1786 mg via INTRAVENOUS
  Filled 2018-09-25: qty 46.97

## 2018-09-25 MED ORDER — PALONOSETRON HCL INJECTION 0.25 MG/5ML
0.2500 mg | Freq: Once | INTRAVENOUS | Status: AC
  Administered 2018-09-25: 0.25 mg via INTRAVENOUS

## 2018-09-25 NOTE — Progress Notes (Signed)
Hematology and Oncology Follow Up Visit  KSHAUN GARGAN ID:[id number] 1970-04-14 48 y.o. 09/25/2018 8:45 AM Pleas Koch, [name], MDBurdine, [name], MD   Principle Diagnosis: 49 year old man with:    1.  Stage IV bladder cancer with involvement of the lung, bone and liver diagnosed in July 2020.  He was initially diagnosed with localized disease in 2019.  2.  Gleason score 6 prostate cancer diagnosed in July 2019.  He status post prostatectomy without any evidence of residual disease.  Prior Therapy:  He is status post a robotic assisted laparoscopic radical cystoprostatectomy and bilateral pelvic lymphadenectomy and neobladder creation completed on November 08, 2017 by Dr. Alinda Money.  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  Prostate adenocarcinoma is 3+4 = 7 with 0 out of 14 lymph nodes noted.  He is status post radiation to the cervical and thoracic spine for a total of 30 Gy in 10 fractions completed on August 06, 2018.  Current therapy: Gemcitabine and cisplatin cycle 1 started on 08/13/2018.  Cisplatin was switched to carboplatin for better tolerance with cycle 2.  He is here for start of cycle 3.  Interim History: Mr. Siegert is here for a follow-up.  Since her last visit, he was hospitalized for UTI and failure to thrive.  Since his discharge, he reports improvement in his overall health.  He has developed mouth sores and thrush which has resolved at this time after nystatin and using Magic mouthwash.  He is eating better with the help of Megace which he reports improved his appetite and gained close to 5 pounds.  His mobility is improving slowly at this time.  Denies any fevers, chills or sweats.  His bone pain is manageable with the current regimen.  He denied any alteration mental status, neuropathy, confusion or dizziness.  Denies any headaches or lethargy.  Denies any night sweats, weight loss or changes in appetite.  Denied orthopnea, dyspnea on exertion or chest  discomfort.  Denies shortness of breath, difficulty breathing hemoptysis or cough.  Denies any abdominal distention, nausea, early satiety or dyspepsia.  Denies any hematuria, frequency, dysuria or nocturia.  Denies any skin irritation, dryness or rash.  Denies any ecchymosis or petechiae.  Denies any lymphadenopathy or clotting.  Denies any heat or cold intolerance.  Denies any anxiety or depression.  Remaining review of system is negative.                  Medications: Updated on review. Current Outpatient Medications  Medication Sig Dispense Refill  . acetaminophen (TYLENOL) 325 MG tablet Take 2 tablets (650 mg total) by mouth every 6 (six) hours as needed for mild pain (or Fever >/= 101). (Patient not taking: Reported on 09/06/2018) 30 tablet 0  . alum & mag hydroxide-simeth (MAALOX/MYLANTA) 200-200-20 MG/5ML suspension Take 15 mLs by mouth every 6 (six) hours as needed for indigestion or heartburn. (Patient not taking: Reported on 09/06/2018) 355 mL 0  . chlorhexidine (PERIDEX) 0.12 % solution 15 mLs by Mouth Rinse route 2 (two) times daily. 120 mL 0  . cyanocobalamin (,VITAMIN B-12,) 1000 MCG/ML injection Inject 1,000 mcg into the muscle every 30 (thirty) days.    [name] dexamethasone (DECADRON) 2 MG tablet Take 1 tablet (2 mg total) by mouth daily. 20 tablet 0  . dronabinol (MARINOL) 2.5 MG capsule TAKE ONE CAPSULE BY MOUTH TWICE DAILY BEFORE a meal 14 capsule 0  . DULoxetine (CYMBALTA) 30 MG capsule Take 1 capsule (30 mg total)  by mouth daily. 30 capsule 0  . fentaNYL (DURAGESIC) 100 MCG/HR Place 1 patch onto the skin every 3 (three) days. 3 patch 0  . HYDROmorphone (DILAUDID) 2 MG tablet Take 1 tablet (2 mg total) by mouth every 4 (four) hours as needed for severe pain. 90 tablet 0  . levofloxacin (LEVAQUIN) 500 MG tablet Take 1 tablet (500 mg total) by mouth daily at 3 pm. 10 tablet 0  . lidocaine-prilocaine (EMLA) cream Apply 1 application topically as needed. 30 g 0  . magic  mouthwash w/lidocaine SOLN Take 15 mLs by mouth 4 (four) times daily. 400 mL 0  . megestrol (MEGACE) 40 MG/ML suspension TAKE TWO TEASPOONFULS (10 ML) BY MOUTH TWICE DAILY 240 mL 0  . Multiple Vitamin (MULTIVITAMIN WITH MINERALS) TABS tablet Take 1 tablet by mouth daily.    . ondansetron (ZOFRAN) 4 MG tablet Take 1 tablet (4 mg total) by mouth every 6 (six) hours as needed for nausea. 20 tablet 0  . pantoprazole (PROTONIX) 40 MG tablet Take 1 tablet (40 mg total) by mouth daily. 30 tablet 0  . polyethylene glycol (MIRALAX / GLYCOLAX) 17 g packet Take 17 g by mouth 2 (two) times daily. 14 each 0  . senna-docusate (SENOKOT-S) 8.6-50 MG tablet Take 2 tablets by mouth 2 (two) times daily. 60 tablet 0   No current facility-administered medications for this visit.    Facility-Administered Medications Ordered in Other Visits  Medication Dose Route Frequency Provider Last Rate Last Dose  . 0.9 %  sodium chloride infusion   Intravenous Once [name], MD         Allergies:  Allergies  Allergen Reactions  . Morphine And Related Itching  . Vicodin [Hydrocodone-Acetaminophen] Itching    Past Medical History, Surgical history, Social history, and Family History updated without changes.    Physical Exam:   Vitals personally reviewed today showed a blood pressure of 140/80, pulse is 100, respiration 20 and afebrile.   ECOG: 2     General appearance: Comfortable appearing without any discomfort Head: Normocephalic without any trauma Oropharynx: Mucous membranes are moist and pink without any thrush or ulcers. Eyes: Pupils are equal and round reactive to light. Lymph nodes: No cervical, supraclavicular, inguinal or axillary lymphadenopathy.   Heart:regular rate and rhythm.  S1 and S2 without leg edema. Lung: Clear without any rhonchi or wheezes.  No dullness to percussion. Abdomin: Soft, nontender, nondistended with good bowel sounds.  No hepatosplenomegaly. Musculoskeletal: No  joint deformity or effusion.  Full range of motion noted. Neurological: No deficits noted on motor, sensory and deep tendon reflex exam. Skin: No petechial rash or dryness.  Appeared moist.  Psychiatric: Mood and affect appeared appropriate.            Lab Results: Lab Results  Component Value Date   WBC 2.1 (L) 09/13/2018   HGB 9.6 (L) 09/13/2018   HCT 31.0 (L) 09/13/2018   MCV 99.7 09/13/2018   PLT 43 (L) 09/13/2018     Chemistry      Component Value Date/Time   NA 148 (H) 09/13/2018 0432   K 4.0 09/13/2018 0432   CL 121 (H) 09/13/2018 0432   CO2 22 09/13/2018 0432   BUN 14 09/13/2018 0432   CREATININE 0.77 09/13/2018 0432   CREATININE 0.84 09/03/2018 0912      Component Value Date/Time   CALCIUM 9.0 09/13/2018 0432   ALKPHOS 193 (H) 09/13/2018 0432   AST 168 (H) 09/13/2018 [id number]  AST 18 09/03/2018 0912   ALT 301 (H) 09/13/2018 0432   ALT 26 09/03/2018 0912   BILITOT 0.5 09/13/2018 0432   BILITOT 0.7 09/03/2018 0912        Impression and Plan:   48 year old with:   1.  Stage IV bladder cancer confirmed in July 2020 with disease to the liver and diffuse bony metastasis.     He is receiving palliative chemotherapy and completed 2 cycles of platinum with gemcitabine.  His cisplatin was switched to carboplatin for better tolerance and due to decline in his performance status.  Risks and benefits of resuming chemotherapy was discussed today.  The natural course of his disease and goals of chemotherapy were reiterated.  At this time he is agreeable to proceed in his performance status is adequate to receive gemcitabine and carboplatin cycle 3 today and will receive day 8 of gemcitabine in 1 week.  The cycle will be repeated in 3 weeks.   2.  IV access:  Port-A-Cath remains in use without any issues at this time.   3.  Antiemetics: No recent nausea or vomiting reported.   4.  Renal function surveillance:  Kidney function remains stable on platinum  therapy.   5.  Goals of care:  Therapy remains palliative with disease that is incurable.  His prognosis is overall remains poor and chemotherapy is to attempt to palliate his symptoms as much as possible.   6.  Bone pain: He remains on long-acting pain medication using fentanyl and breakthrough Dilaudid.  7.  Weight loss and failure to thrive: Improved with Megace.  8.  Follow-up: In 1 week for day 8 of chemotherapy and in 3 weeks for the start of cycle 4.   25 minutes was spent with the patient face-to-face today.  More than 50% of time was spent on updating the natural course of his disease, treatment goals, complication related therapy as well as future plan of care.    Zola Button, MD 9/9/20208:45 AM

## 2018-09-25 NOTE — Patient Instructions (Signed)
[facility] Discharge Instructions for Patients Receiving Chemotherapy  Today you received the following chemotherapy agents Gemcitabine (GEMZAR) & Carboplatin (PARAPLATIN).  To help prevent nausea and vomiting after your treatment, we encourage you to take your nausea medication as prescribed.   If you develop nausea and vomiting that is not controlled by your nausea medication, call the clinic.   BELOW ARE SYMPTOMS THAT SHOULD BE REPORTED IMMEDIATELY:  *FEVER GREATER THAN 100.5 F  *CHILLS WITH OR WITHOUT FEVER  NAUSEA AND VOMITING THAT IS NOT CONTROLLED WITH YOUR NAUSEA MEDICATION  *UNUSUAL SHORTNESS OF BREATH  *UNUSUAL BRUISING OR BLEEDING  TENDERNESS IN MOUTH AND THROAT WITH OR WITHOUT PRESENCE OF ULCERS  *URINARY PROBLEMS  *BOWEL PROBLEMS  UNUSUAL RASH Items with * indicate a potential emergency and should be followed up as soon as possible.  Feel free to call the clinic should you have any questions or concerns. The clinic phone number is [phone].  Please show the CHEMO ALERT CARD at check-in to the Emergency Department and triage nurse.  Coronavirus (COVID-19) Are you at risk?  Are you at risk for the Coronavirus (COVID-19)?  To be considered HIGH RISK for Coronavirus (COVID-19), you have to meet the following criteria:  . Traveled to [place], [place], [place], [place] or [place]; or in the [place] to [place], [place], [place], or [place]; and have fever, cough, and shortness of breath within the last 2 weeks of travel OR . Been in close contact with a person diagnosed with COVID-19 within the last 2 weeks and have fever, cough, and shortness of breath . IF YOU DO NOT MEET THESE CRITERIA, YOU ARE CONSIDERED LOW RISK FOR COVID-19.  What to do if you are HIGH RISK for COVID-19?  . If you are having a medical emergency, call 911. . Seek medical care right away. Before you go to a doctor's office, urgent care or emergency department,  call ahead and tell them about your recent travel, contact with someone diagnosed with COVID-19, and your symptoms. You should receive instructions from your physician's office regarding next steps of care.  . When you arrive at healthcare provider, tell the healthcare staff immediately you have returned from visiting [place], [place], [place], [place] or [place]; or traveled in the [place] to [place], [place], [place], or [place]; in the last two weeks or you have been in close contact with a person diagnosed with COVID-19 in the last 2 weeks.   . Tell the health care staff about your symptoms: fever, cough and shortness of breath. . After you have been seen by a medical provider, you will be either: o Tested for (COVID-19) and discharged home on quarantine except to seek medical care if symptoms worsen, and asked to  - Stay home and avoid contact with others until you get your results (4-5 days)  - Avoid travel on public transportation if possible (such as bus, train, or airplane) or o Sent to the Emergency Department by EMS for evaluation, COVID-19 testing, and possible admission depending on your condition and test results.  What to do if you are LOW RISK for COVID-19?  Reduce your risk of any infection by using the same precautions used for avoiding the common cold or flu:  . Wash your hands often with soap and warm water for at least 20 seconds.  If soap and water are not readily available, use an alcohol-based hand sanitizer with at least 60% alcohol.  .   If coughing or sneezing, cover your mouth and nose by coughing or sneezing into the elbow areas of your shirt or coat, into a tissue or into your sleeve (not your hands). . Avoid shaking hands with others and consider head nods or verbal greetings only. . Avoid touching your eyes, nose, or mouth with unwashed hands.  . Avoid close contact with people who are sick. . Avoid places or events with large numbers of people in one  location, like concerts or sporting events. . Carefully consider travel plans you have or are making. . If you are planning any travel outside or inside the [place], visit the CDC's Travelers' Health webpage for the latest health notices. . If you have some symptoms but not all symptoms, continue to monitor at home and seek medical attention if your symptoms worsen. . If you are having a medical emergency, call 911.   ADDITIONAL HEALTHCARE OPTIONS FOR PATIENTS  [facility] / e-Visit: [email or web address]         [facility]: [phone]  [facility]: [phone]                   [facility]: [phone]   

## 2018-09-25 NOTE — Progress Notes (Signed)
Patient will not receive the missed treatment from 8/25 for Gemzar. He will instead receive D1C2 Gemzar/Carbo scheduled for 09/24/18 per Dr. [name] verbal order. Spoke with Foy Guadalajara and the treatment plan will be updated with D1C2 Gemzar/Carbo for 9/9. [place] RN aware.  Called in prescription to [facility] for magic mouthwash with lidocaine to [facility] pharmacist.

## 2018-09-26 NOTE — Progress Notes (Signed)
  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  The patient was treated to the C-spine centered at the C3 level to a dose of 24 Gray in 8 fractions.  This yielded a total dose of 30 Gray after treatment to this area at an outside hospital.  This was accomplished using a complex isodose plan 2.  The patient was treated to the T2-T10 levels of the thoracic spine to a dose of 24 Gy in 8 fractions.  This yielded a total dose of 30 Gray after treatment to this area at an outside hospital.  This was accomplished using a complex isodose plan 3.  The patient was treated to the left ilium to a dose of 30 Gy in 10 fractions.  This was accomplished using an complex isodose plan 4.  The patient was treated to a small metastasis within the left tibia to a dose of 8 Gray in 1 fraction.  This was accomplished using a complex isodose plan.  Narrative: The patient tolerated radiation treatment relatively well.   The patient's pain did improve as he proceeded through the course of radiation treatment.  No significant difficulties were encountered in terms of acute toxicity.  Plan: The patient has completed radiation treatment. The patient will return to radiation oncology clinic for routine followup in one month. I advised the patient to call or return sooner if they have any questions or concerns related to their recovery or treatment. ________________________________  Jodelle Gross, M.D., Ph.D.

## 2018-09-30 ENCOUNTER — Other Ambulatory Visit: Payer: Self-pay | Admitting: Oncology

## 2018-09-30 MED ORDER — DRONABINOL 2.5 MG PO CAPS: 2.5000 mg | ORAL_CAPSULE | Freq: Two times a day (BID) | ORAL | 0 refills | Status: DC

## 2018-10-02 ENCOUNTER — Other Ambulatory Visit: Payer: Self-pay

## 2018-10-02 ENCOUNTER — Inpatient Hospital Stay: Payer: PRIVATE HEALTH INSURANCE

## 2018-10-02 ENCOUNTER — Inpatient Hospital Stay: Payer: PRIVATE HEALTH INSURANCE | Admitting: Nutrition

## 2018-10-02 VITALS — BP 116/90 | HR 93 | Temp 97.8°F | Resp 16

## 2018-10-02 DIAGNOSIS — Z5111 Encounter for antineoplastic chemotherapy: Secondary | ICD-10-CM | POA: Diagnosis not present

## 2018-10-02 DIAGNOSIS — C801 Malignant (primary) neoplasm, unspecified: Secondary | ICD-10-CM

## 2018-10-02 DIAGNOSIS — C7951 Secondary malignant neoplasm of bone: Secondary | ICD-10-CM

## 2018-10-02 DIAGNOSIS — C61 Malignant neoplasm of prostate: Secondary | ICD-10-CM

## 2018-10-02 DIAGNOSIS — Z95828 Presence of other vascular implants and grafts: Secondary | ICD-10-CM

## 2018-10-02 DIAGNOSIS — C679 Malignant neoplasm of bladder, unspecified: Secondary | ICD-10-CM

## 2018-10-02 LAB — CBC WITH DIFFERENTIAL (CANCER CENTER ONLY)
Abs Immature Granulocytes: 0.17 10*3/uL — ABNORMAL HIGH (ref 0.00–0.07)
Basophils Absolute: 0 10*3/uL (ref 0.0–0.1)
Basophils Relative: 0 %
Eosinophils Absolute: 0 10*3/uL (ref 0.0–0.5)
Eosinophils Relative: 0 %
HCT: 28.6 % — ABNORMAL LOW (ref 39.0–52.0)
Hemoglobin: 9.5 g/dL — ABNORMAL LOW (ref 13.0–17.0)
Immature Granulocytes: 5 %
Lymphocytes Relative: 18 %
Lymphs Abs: 0.6 10*3/uL — ABNORMAL LOW (ref 0.7–4.0)
MCH: 32.1 pg (ref 26.0–34.0)
MCHC: 33.2 g/dL (ref 30.0–36.0)
MCV: 96.6 fL (ref 80.0–100.0)
Monocytes Absolute: 0.2 10*3/uL (ref 0.1–1.0)
Monocytes Relative: 7 %
Neutro Abs: 2.4 10*3/uL (ref 1.7–7.7)
Neutrophils Relative %: 70 %
Platelet Count: 197 10*3/uL (ref 150–400)
RBC: 2.96 MIL/uL — ABNORMAL LOW (ref 4.22–5.81)
RDW: 17.7 % — ABNORMAL HIGH (ref 11.5–15.5)
WBC Count: 3.4 10*3/uL — ABNORMAL LOW (ref 4.0–10.5)
nRBC: 0 % (ref 0.0–0.2)

## 2018-10-02 LAB — CMP (CANCER CENTER ONLY)
ALT: 27 U/L (ref 0–44)
AST: 31 U/L (ref 15–41)
Albumin: 3.2 g/dL — ABNORMAL LOW (ref 3.5–5.0)
Alkaline Phosphatase: 262 U/L — ABNORMAL HIGH (ref 38–126)
Anion gap: 11 (ref 5–15)
BUN: 24 mg/dL — ABNORMAL HIGH (ref 6–20)
CO2: 18 mmol/L — ABNORMAL LOW (ref 22–32)
Calcium: 9.6 mg/dL (ref 8.9–10.3)
Chloride: 112 mmol/L — ABNORMAL HIGH (ref 98–111)
Creatinine: 0.7 mg/dL (ref 0.61–1.24)
GFR, Est AFR Am: >60 mL/min (ref >60)
GFR, Estimated: >60 mL/min (ref >60)
Glucose, Bld: 113 mg/dL — ABNORMAL HIGH (ref 70–99)
Potassium: 4.3 mmol/L (ref 3.5–5.1)
Sodium: 141 mmol/L (ref 135–145)
Total Bilirubin: 0.2 mg/dL — ABNORMAL LOW (ref 0.3–1.2)
Total Protein: 6.8 g/dL (ref 6.5–8.1)

## 2018-10-02 MED ORDER — LORAZEPAM 2 MG/ML IJ SOLN
INTRAMUSCULAR | Status: AC
  Filled 2018-10-02: qty 1

## 2018-10-02 MED ORDER — PROCHLORPERAZINE MALEATE 10 MG PO TABS
ORAL_TABLET | ORAL | Status: AC
  Filled 2018-10-02: qty 1

## 2018-10-02 MED ORDER — LORAZEPAM 2 MG/ML IJ SOLN: 0.5000 mg | Freq: Once | INTRAMUSCULAR | Status: DC

## 2018-10-02 MED ORDER — HEPARIN SOD (PORK) LOCK FLUSH 100 UNIT/ML IV SOLN
500.0000 [IU] | Freq: Once | INTRAVENOUS | Status: AC | PRN
  Administered 2018-10-02: 11:00:00 500 [IU]
  Filled 2018-10-02: qty 5

## 2018-10-02 MED ORDER — SODIUM CHLORIDE 0.9% FLUSH
10.0000 mL | Freq: Once | INTRAVENOUS | Status: AC
  Administered 2018-10-02: 08:00:00 10 mL
  Filled 2018-10-02: qty 10

## 2018-10-02 MED ORDER — SODIUM CHLORIDE 0.9% FLUSH
10.0000 mL | INTRAVENOUS | Status: DC | PRN
  Administered 2018-10-02: 11:00:00 10 mL
  Filled 2018-10-02: qty 10

## 2018-10-02 MED ORDER — SODIUM CHLORIDE 0.9 % IV SOLN
Freq: Once | INTRAVENOUS | Status: AC
  Administered 2018-10-02: 10:00:00 via INTRAVENOUS
  Filled 2018-10-02: qty 250

## 2018-10-02 MED ORDER — ACETAMINOPHEN 325 MG PO TABS
ORAL_TABLET | ORAL | Status: AC
  Filled 2018-10-02: qty 2

## 2018-10-02 MED ORDER — ACETAMINOPHEN 325 MG PO TABS
650.0000 mg | ORAL_TABLET | Freq: Once | ORAL | Status: AC
  Administered 2018-10-02: 650 mg via ORAL

## 2018-10-02 MED ORDER — PROCHLORPERAZINE MALEATE 10 MG PO TABS
10.0000 mg | ORAL_TABLET | Freq: Once | ORAL | Status: AC
  Administered 2018-10-02: 10 mg via ORAL

## 2018-10-02 MED ORDER — SODIUM CHLORIDE 0.9 % IV SOLN
1000.0000 mg/m2 | Freq: Once | INTRAVENOUS | Status: AC
  Administered 2018-10-02: 10:00:00 1786 mg via INTRAVENOUS
  Filled 2018-10-02: qty 46.97

## 2018-10-02 NOTE — Patient Instructions (Signed)
[facility] °Discharge Instructions for Patients Receiving Chemotherapy ° °Today you received the following chemotherapy agents Gemzar ° °To help prevent nausea and vomiting after your treatment, we encourage you to take your nausea medication as directed. °  °If you develop nausea and vomiting that is not controlled by your nausea medication, call the clinic.  ° °BELOW ARE SYMPTOMS THAT SHOULD BE REPORTED IMMEDIATELY: °· *FEVER GREATER THAN 100.5 F °· *CHILLS WITH OR WITHOUT FEVER °· NAUSEA AND VOMITING THAT IS NOT CONTROLLED WITH YOUR NAUSEA MEDICATION °· *UNUSUAL SHORTNESS OF BREATH °· *UNUSUAL BRUISING OR BLEEDING °· TENDERNESS IN MOUTH AND THROAT WITH OR WITHOUT PRESENCE OF ULCERS °· *URINARY PROBLEMS °· *BOWEL PROBLEMS °· UNUSUAL RASH °Items with * indicate a potential emergency and should be followed up as soon as possible. ° °Feel free to call the clinic should you have any questions or concerns. The clinic phone number is [phone]. ° °Please show the CHEMO ALERT CARD at check-in to the Emergency Department and triage nurse. ° ° °

## 2018-10-02 NOTE — Progress Notes (Signed)
Upon arrival to treatment room, patient c/o acute on chronic abdominal/back pain. States he took am pain meds and vomited shortly after. Refused intervention at the beginning of treatment and later requested Tylenol. Sandi Mealy, PA-C aware. Orders received, repeated, and confirmed and patient medicated as documented in [facility]. At the end of treatment, patient had one episode of emesis (approximately 663mL). Sandi Mealy, PA-C again notified and orders received, repeated, and confirmed. Patient quickly returned to baseline and advised he did not want Ativan. Encouraged patient to notify his MD if symptoms return. Patient verbalized understanding. Patient discharged in no acute distress.

## 2018-10-02 NOTE — Progress Notes (Signed)
Nutrition follow-up completed with patient during infusion for stage IV bladder cancer/prostate cancer. Patient reports he feels great. His pain is better controlled. Reports appetite is improved and he has been eating more.  Weight has been stable for approximately 1 month and was documented as 145 pounds on September 9. He reports one episode of nausea and vomiting yesterday. He has not been drinking Carnation breakfast because he feels he is eating better and does not require it anymore.  Nutrition diagnosis: Unintended weight loss is stable.  Intervention: Educated patient to continue strategies for adequate calorie and protein intake. Encourage smaller more frequent meals and snacks throughout the day. Recommended patient may want to continue oral nutrition supplements for additional calories and protein. Questions were answered.  Teach back method used.  Monitoring, evaluation, goals: Patient will tolerate adequate calories and protein to promote weight maintenance.  Next visit: Wednesday, October 7 during infusion.  **Disclaimer: This note was dictated with voice recognition software. Similar sounding words can inadvertently be transcribed and this note may contain transcription errors which may not have been corrected upon publication of note.**

## 2018-10-08 ENCOUNTER — Other Ambulatory Visit: Payer: Self-pay | Admitting: Oncology

## 2018-10-08 MED ORDER — FENTANYL 100 MCG/HR TD PT72: 1.0000 | MEDICATED_PATCH | TRANSDERMAL | 0 refills | Status: DC

## 2018-10-09 ENCOUNTER — Other Ambulatory Visit: Payer: Self-pay | Admitting: Oncology

## 2018-10-09 ENCOUNTER — Telehealth: Payer: Self-pay

## 2018-10-09 NOTE — Telephone Encounter (Signed)
Patient called office stating he has been experiencing chest pain x 2 nights, mainly when lying down. Patient states pain is sharp, comes and goes, and radiates to R shoulder and down right side. Patient's fiance states patient becomes SOB and diaphoretic when experience chest pain. Patient advised to call 911 and be evaluated at nearest hospital. Patient and fiance verbalized understanding. Dr. [name] made aware, no new orders at this time.

## 2018-10-11 ENCOUNTER — Other Ambulatory Visit: Payer: Self-pay

## 2018-10-11 ENCOUNTER — Encounter (HOSPITAL_COMMUNITY): Payer: Self-pay | Admitting: Emergency Medicine

## 2018-10-11 ENCOUNTER — Emergency Department (HOSPITAL_COMMUNITY): Payer: PRIVATE HEALTH INSURANCE

## 2018-10-11 ENCOUNTER — Inpatient Hospital Stay (HOSPITAL_COMMUNITY)
Admission: EM | Admit: 2018-10-11 | Discharge: 2018-10-13 | DRG: 175 | Disposition: A | Discharge status: Home / Self Care | Payer: PRIVATE HEALTH INSURANCE | Attending: Internal Medicine | Admitting: Internal Medicine

## 2018-10-11 DIAGNOSIS — I824Y2 Acute embolism and thrombosis of unspecified deep veins of left proximal lower extremity: Secondary | ICD-10-CM | POA: Diagnosis present

## 2018-10-11 DIAGNOSIS — M8448XA Pathological fracture, other site, initial encounter for fracture: Secondary | ICD-10-CM | POA: Diagnosis present

## 2018-10-11 DIAGNOSIS — C689 Malignant neoplasm of urinary organ, unspecified: Secondary | ICD-10-CM | POA: Diagnosis present

## 2018-10-11 DIAGNOSIS — C61 Malignant neoplasm of prostate: Secondary | ICD-10-CM | POA: Diagnosis present

## 2018-10-11 DIAGNOSIS — I071 Rheumatic tricuspid insufficiency: Secondary | ICD-10-CM | POA: Diagnosis present

## 2018-10-11 DIAGNOSIS — Z87891 Personal history of nicotine dependence: Secondary | ICD-10-CM

## 2018-10-11 DIAGNOSIS — I2699 Other pulmonary embolism without acute cor pulmonale: Secondary | ICD-10-CM | POA: Diagnosis present

## 2018-10-11 DIAGNOSIS — D61810 Antineoplastic chemotherapy induced pancytopenia: Secondary | ICD-10-CM | POA: Diagnosis present

## 2018-10-11 DIAGNOSIS — T451X5A Adverse effect of antineoplastic and immunosuppressive drugs, initial encounter: Secondary | ICD-10-CM | POA: Diagnosis present

## 2018-10-11 DIAGNOSIS — M4850XA Collapsed vertebra, not elsewhere classified, site unspecified, initial encounter for fracture: Secondary | ICD-10-CM

## 2018-10-11 DIAGNOSIS — Z20828 Contact with and (suspected) exposure to other viral communicable diseases: Secondary | ICD-10-CM | POA: Diagnosis present

## 2018-10-11 DIAGNOSIS — I248 Other forms of acute ischemic heart disease: Secondary | ICD-10-CM | POA: Diagnosis present

## 2018-10-11 DIAGNOSIS — Z906 Acquired absence of other parts of urinary tract: Secondary | ICD-10-CM

## 2018-10-11 DIAGNOSIS — R112 Nausea with vomiting, unspecified: Secondary | ICD-10-CM | POA: Diagnosis not present

## 2018-10-11 DIAGNOSIS — Z8619 Personal history of other infectious and parasitic diseases: Secondary | ICD-10-CM

## 2018-10-11 DIAGNOSIS — Z79899 Other long term (current) drug therapy: Secondary | ICD-10-CM

## 2018-10-11 DIAGNOSIS — N390 Urinary tract infection, site not specified: Secondary | ICD-10-CM | POA: Diagnosis present

## 2018-10-11 DIAGNOSIS — Z9079 Acquired absence of other genital organ(s): Secondary | ICD-10-CM

## 2018-10-11 DIAGNOSIS — Z7952 Long term (current) use of systemic steroids: Secondary | ICD-10-CM

## 2018-10-11 DIAGNOSIS — I82412 Acute embolism and thrombosis of left femoral vein: Secondary | ICD-10-CM | POA: Diagnosis present

## 2018-10-11 DIAGNOSIS — F419 Anxiety disorder, unspecified: Secondary | ICD-10-CM | POA: Diagnosis present

## 2018-10-11 DIAGNOSIS — Z885 Allergy status to narcotic agent status: Secondary | ICD-10-CM

## 2018-10-11 DIAGNOSIS — C7951 Secondary malignant neoplasm of bone: Secondary | ICD-10-CM | POA: Diagnosis present

## 2018-10-11 DIAGNOSIS — F329 Major depressive disorder, single episode, unspecified: Secondary | ICD-10-CM | POA: Diagnosis present

## 2018-10-11 LAB — COMPREHENSIVE METABOLIC PANEL
ALT: 40 U/L (ref 0–44)
AST: 34 U/L (ref 15–41)
Albumin: 3.9 g/dL (ref 3.5–5.0)
Alkaline Phosphatase: 216 U/L — ABNORMAL HIGH (ref 38–126)
Anion gap: 15 (ref 5–15)
BUN: 24 mg/dL — ABNORMAL HIGH (ref 6–20)
CO2: 16 mmol/L — ABNORMAL LOW (ref 22–32)
Calcium: 10.5 mg/dL — ABNORMAL HIGH (ref 8.9–10.3)
Chloride: 105 mmol/L (ref 98–111)
Creatinine, Ser: 0.64 mg/dL (ref 0.61–1.24)
GFR calc Af Amer: >60 mL/min (ref >60)
GFR calc non Af Amer: >60 mL/min (ref >60)
Glucose, Bld: 130 mg/dL — ABNORMAL HIGH (ref 70–99)
Potassium: 4.2 mmol/L (ref 3.5–5.1)
Sodium: 136 mmol/L (ref 135–145)
Total Bilirubin: 0.5 mg/dL (ref 0.3–1.2)
Total Protein: 7.1 g/dL (ref 6.5–8.1)

## 2018-10-11 LAB — DIFFERENTIAL
Abs Immature Granulocytes: 0.06 10*3/uL (ref 0.00–0.07)
Basophils Absolute: 0 10*3/uL (ref 0.0–0.1)
Basophils Relative: 0 %
Eosinophils Absolute: 0 10*3/uL (ref 0.0–0.5)
Eosinophils Relative: 0 %
Immature Granulocytes: 2 %
Lymphocytes Relative: 26 %
Lymphs Abs: 0.7 10*3/uL (ref 0.7–4.0)
Monocytes Absolute: 0.4 10*3/uL (ref 0.1–1.0)
Monocytes Relative: 15 %
Neutro Abs: 1.5 10*3/uL — ABNORMAL LOW (ref 1.7–7.7)
Neutrophils Relative %: 57 %

## 2018-10-11 LAB — CBC
HCT: 25.9 % — ABNORMAL LOW (ref 39.0–52.0)
Hemoglobin: 8.8 g/dL — ABNORMAL LOW (ref 13.0–17.0)
MCH: 32.5 pg (ref 26.0–34.0)
MCHC: 34 g/dL (ref 30.0–36.0)
MCV: 95.6 fL (ref 80.0–100.0)
Platelets: 125 10*3/uL — ABNORMAL LOW (ref 150–400)
RBC: 2.71 MIL/uL — ABNORMAL LOW (ref 4.22–5.81)
RDW: 17.1 % — ABNORMAL HIGH (ref 11.5–15.5)
WBC: 2.6 10*3/uL — ABNORMAL LOW (ref 4.0–10.5)
nRBC: 12.9 % — ABNORMAL HIGH (ref 0.0–0.2)

## 2018-10-11 LAB — PROTIME-INR
INR: 1.1 (ref 0.8–1.2)
Prothrombin Time: 14.3 seconds (ref 11.4–15.2)

## 2018-10-11 LAB — URINALYSIS, ROUTINE W REFLEX MICROSCOPIC
Bilirubin Urine: NEGATIVE
Glucose, UA: NEGATIVE mg/dL
Ketones, ur: NEGATIVE mg/dL
Nitrite: NEGATIVE
Protein, ur: 30 mg/dL — AB
Specific Gravity, Urine: 1.006 (ref 1.005–1.030)
WBC, UA: >50 WBC/hpf — ABNORMAL HIGH (ref 0–5)
pH: 9 — ABNORMAL HIGH (ref 5.0–8.0)

## 2018-10-11 LAB — MAGNESIUM: Magnesium: 2 mg/dL (ref 1.7–2.4)

## 2018-10-11 LAB — APTT: aPTT: 30 seconds (ref 24–36)

## 2018-10-11 LAB — BRAIN NATRIURETIC PEPTIDE: B Natriuretic Peptide: 92.7 pg/mL (ref 0.0–100.0)

## 2018-10-11 LAB — LIPASE, BLOOD: Lipase: 25 U/L (ref 11–51)

## 2018-10-11 LAB — TROPONIN I (HIGH SENSITIVITY)
Troponin I (High Sensitivity): 27 ng/L — ABNORMAL HIGH (ref <18)
Troponin I (High Sensitivity): 31 ng/L — ABNORMAL HIGH (ref <18)

## 2018-10-11 MED ORDER — ONDANSETRON HCL 4 MG/2ML IJ SOLN
4.0000 mg | Freq: Once | INTRAMUSCULAR | Status: AC
  Administered 2018-10-11: 18:00:00 4 mg via INTRAVENOUS
  Filled 2018-10-11: qty 2

## 2018-10-11 MED ORDER — SENNOSIDES-DOCUSATE SODIUM 8.6-50 MG PO TABS
2.0000 | ORAL_TABLET | Freq: Two times a day (BID) | ORAL | Status: DC
  Administered 2018-10-12 – 2018-10-13 (×3): 2 via ORAL
  Filled 2018-10-11 (×3): qty 2

## 2018-10-11 MED ORDER — IOHEXOL 350 MG/ML SOLN
100.0000 mL | Freq: Once | INTRAVENOUS | Status: AC | PRN
  Administered 2018-10-11: 19:00:00 100 mL via INTRAVENOUS

## 2018-10-11 MED ORDER — ACETAMINOPHEN 325 MG PO TABS: 650.0000 mg | ORAL_TABLET | Freq: Four times a day (QID) | ORAL | Status: DC | PRN

## 2018-10-11 MED ORDER — MAGIC MOUTHWASH W/LIDOCAINE
15.0000 mL | Freq: Four times a day (QID) | ORAL | Status: DC | PRN
  Filled 2018-10-11: qty 15

## 2018-10-11 MED ORDER — HYDROMORPHONE HCL 2 MG PO TABS
4.0000 mg | ORAL_TABLET | ORAL | Status: DC | PRN
  Administered 2018-10-12 – 2018-10-13 (×6): 4 mg via ORAL
  Filled 2018-10-11 (×7): qty 2

## 2018-10-11 MED ORDER — FENTANYL CITRATE (PF) 100 MCG/2ML IJ SOLN
50.0000 ug | Freq: Once | INTRAMUSCULAR | Status: AC
  Administered 2018-10-11: 18:00:00 50 ug via INTRAVENOUS
  Filled 2018-10-11: qty 2

## 2018-10-11 MED ORDER — FENTANYL CITRATE (PF) 100 MCG/2ML IJ SOLN
50.0000 ug | Freq: Once | INTRAMUSCULAR | Status: AC
  Administered 2018-10-11: 50 ug via INTRAVENOUS
  Filled 2018-10-11: qty 2

## 2018-10-11 MED ORDER — POLYETHYLENE GLYCOL 3350 17 G PO PACK: 17.0000 g | PACK | Freq: Every day | ORAL | Status: DC | PRN

## 2018-10-11 MED ORDER — ACETAMINOPHEN 650 MG RE SUPP: 650.0000 mg | Freq: Four times a day (QID) | RECTAL | Status: DC | PRN

## 2018-10-11 MED ORDER — DRONABINOL 2.5 MG PO CAPS
2.5000 mg | ORAL_CAPSULE | Freq: Two times a day (BID) | ORAL | Status: DC
  Administered 2018-10-12 – 2018-10-13 (×3): 2.5 mg via ORAL
  Filled 2018-10-11 (×3): qty 1

## 2018-10-11 MED ORDER — FENTANYL 100 MCG/HR TD PT72: 1.0000 | MEDICATED_PATCH | TRANSDERMAL | Status: DC

## 2018-10-11 MED ORDER — PANTOPRAZOLE SODIUM 40 MG PO TBEC
40.0000 mg | DELAYED_RELEASE_TABLET | Freq: Every day | ORAL | Status: DC
  Administered 2018-10-12 – 2018-10-13 (×2): 40 mg via ORAL
  Filled 2018-10-11 (×2): qty 1

## 2018-10-11 MED ORDER — HEPARIN (PORCINE) 25000 UT/250ML-% IV SOLN
1250.0000 [IU]/h | INTRAVENOUS | Status: AC
  Administered 2018-10-12: 1350 [IU]/h via INTRAVENOUS
  Administered 2018-10-12: 1250 [IU]/h via INTRAVENOUS
  Filled 2018-10-11 (×4): qty 250

## 2018-10-11 MED ORDER — HEPARIN BOLUS VIA INFUSION
4000.0000 [IU] | Freq: Once | INTRAVENOUS | Status: AC
  Administered 2018-10-12: 4000 [IU] via INTRAVENOUS
  Filled 2018-10-11: qty 4000

## 2018-10-11 MED ORDER — ONDANSETRON HCL 4 MG PO TABS: 4.0000 mg | ORAL_TABLET | Freq: Four times a day (QID) | ORAL | Status: DC | PRN

## 2018-10-11 MED ORDER — PROCHLORPERAZINE MALEATE 10 MG PO TABS: 10.0000 mg | ORAL_TABLET | Freq: Four times a day (QID) | ORAL | Status: DC | PRN

## 2018-10-11 MED ORDER — ONDANSETRON HCL 4 MG/2ML IJ SOLN
4.0000 mg | Freq: Four times a day (QID) | INTRAMUSCULAR | Status: DC | PRN
  Administered 2018-10-12: 4 mg via INTRAVENOUS
  Filled 2018-10-11: qty 2

## 2018-10-11 MED ORDER — SODIUM CHLORIDE 0.9 % IV BOLUS
1000.0000 mL | Freq: Once | INTRAVENOUS | Status: AC
  Administered 2018-10-11: 18:00:00 1000 mL via INTRAVENOUS

## 2018-10-11 MED ORDER — MORPHINE SULFATE (PF) 4 MG/ML IV SOLN
4.0000 mg | Freq: Once | INTRAVENOUS | Status: DC
  Filled 2018-10-11: qty 1

## 2018-10-11 MED ORDER — DULOXETINE HCL 30 MG PO CPEP
30.0000 mg | ORAL_CAPSULE | Freq: Every day | ORAL | Status: DC
  Administered 2018-10-12 – 2018-10-13 (×2): 30 mg via ORAL
  Filled 2018-10-11 (×2): qty 1

## 2018-10-11 MED ORDER — DEXAMETHASONE 4 MG PO TABS
2.0000 mg | ORAL_TABLET | Freq: Every day | ORAL | Status: DC
  Administered 2018-10-12 – 2018-10-13 (×2): 2 mg via ORAL
  Filled 2018-10-11 (×3): qty 1

## 2018-10-11 MED ORDER — SODIUM CHLORIDE 0.9 % IV SOLN
1.0000 g | Freq: Once | INTRAVENOUS | Status: AC
  Administered 2018-10-11: 18:00:00 1 g via INTRAVENOUS
  Filled 2018-10-11: qty 10

## 2018-10-11 MED ORDER — HYDROMORPHONE HCL 1 MG/ML IJ SOLN
1.0000 mg | Freq: Once | INTRAMUSCULAR | Status: AC
  Administered 2018-10-11: 22:00:00 1 mg via INTRAVENOUS
  Filled 2018-10-11: qty 1

## 2018-10-11 MED ORDER — CHLORHEXIDINE GLUCONATE CLOTH 2 % EX PADS
6.0000 | MEDICATED_PAD | Freq: Every day | CUTANEOUS | Status: DC
  Administered 2018-10-12 – 2018-10-13 (×2): 6 via TOPICAL

## 2018-10-11 MED ORDER — SODIUM CHLORIDE (PF) 0.9 % IJ SOLN
INTRAMUSCULAR | Status: AC
  Filled 2018-10-11: qty 50

## 2018-10-11 MED ORDER — FENTANYL CITRATE (PF) 100 MCG/2ML IJ SOLN: 50.0000 ug | Freq: Once | INTRAMUSCULAR | Status: DC

## 2018-10-11 NOTE — ED Provider Notes (Signed)
[facility] [name] DEPT Provider Note   CSN: [id number] Arrival date & time: 10/11/18  1539     History   Chief Complaint Chief Complaint  Patient presents with  . Emesis  . Chest Pain    HPI Rodney [name] is a 48 y.o. male.  Presents to the ER with new onset chest pain, back pain.  Patient's past medical history of prostate cancer, has been struggling with significant pain from metastases.  States pain in his chest and back is new though.  Is associated with shortness of breath.  Also is having some nausea and vomiting.  States his back pain is mid upper back, sharp, stabbing, severe in nature.  Denies any associated trauma.  No associated numbness, weakness.  States his chest pain is worse when he takes a deep breath or tries to exert himself.  Also sharp and stabbing.  Worse with cough.  Denies new cough, no productive cough.  No fevers.  States he has been dealing with resting tachycardia since being on the chemotherapy.  No prior history of DVT or PE.     HPI  Past Medical History:  Diagnosis Date  . Anxiety   . Bladder tumor   . Blood in urine    saw some 3 days ago but none inthe last 2 days   . Bradycardia   . Depression   . Dizziness    historical   . Elevated PSA   . Family history of gastric cancer   . Heartburn    occasional   . Pain with urination   . Prostate cancer ([place])   . 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 Palliative care by specialist   . Hypokalemia 09/08/2018  . Hypophosphatemia 09/08/2018  . Acute metabolic encephalopathy 0000000  . Anemia associated with chemotherapy 09/08/2018  . Leukopenia due to antineoplastic chemotherapy ([place]) 09/08/2018  . Bacteremia 09/08/2018  . AKI (acute kidney injury) ([place])   . Failure to thrive in adult   . UTI (urinary tract infection) 09/07/2018  . ARF (acute renal failure) ([place]) 09/07/2018  . Dehydration 09/07/2018  . Hypernatremia  09/07/2018  . AMS (altered mental status) 09/07/2018  . FTT (failure to thrive) in adult 09/07/2018  . Metabolic acidosis [id number]  . Acute hypernatremia 09/07/2018  . Odynophagia 09/07/2018  . Oral thrush 09/07/2018  . Port-A-Cath in place 08/13/2018  . 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 Pain   . Metastatic cancer ([place])   . Goals of care, counseling/discussion   . Intractable pain 07/20/2018  . Constipation 07/20/2018  . Acute lower UTI 07/20/2018  . Leukocytosis 07/20/2018  . 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 Bladder cancer ([place]) 11/08/2017  . Genetic testing 10/26/2017  . Family history of prostate cancer   . Family history of gastric cancer   . Family history of melanoma   . Family history of colon cancer   . Urothelial cancer ([place])   . Malignant neoplasm of prostate ([place]) 09/11/2017    Past Surgical History:  Procedure Laterality Date  . BACK SURGERY  2012   3 disc; dr Carloyn Manner  did first and dr elsner did the last 2   . CYSTOSCOPY W/ RETROGRADES N/A 07/02/2017   Procedure: CYSTOSCOPY WITH RETROGRADE PYELOGRAM/EXAM UNDER ANESTHESIA;  Surgeon: Raynelle Bring, MD;  Location: [facility];  Service: Urology;  Laterality: N/A;  . CYSTOSCOPY WITH BIOPSY N/A 08/09/2017   Procedure: CYSTOSCOPY WITH PROSTATE NEEDLE BIOPSY;  Surgeon: Raynelle Bring, MD;  Location: [facility];  Service: Urology;  Laterality: N/A;  GENERAL ANESTHESIA WITH PARALYSIS/ ONLY NEEDS 60 MIN FOR ALL PROCEDURES  . IR IMAGING GUIDED PORT INSERTION  08/07/2018  . LEG SURGERY  2012   4 metal plates in plates   . TRANSRECTAL ULTRASOUND N/A 08/09/2017   Procedure: TRANSRECTAL ULTRASOUND;  Surgeon: Raynelle Bring, MD;  Location: [facility];  Service: Urology;  Laterality: N/A;  ONLY NEEDS 60 MIN FOR ALL PROCEDURES  . TRANSURETHRAL RESECTION OF BLADDER TUMOR N/A 07/02/2017   Procedure: TRANSURETHRAL RESECTION OF BLADDER  TUMOR (TURBT);  Surgeon: Raynelle Bring, MD;  Location: [facility];  Service: Urology;  Laterality: N/A;  . TRANSURETHRAL RESECTION OF BLADDER TUMOR N/A 08/09/2017   Procedure: TRANSURETHRAL RESECTION OF BLADDER TUMOR (TURBT);  Surgeon: Raynelle Bring, MD;  Location: [facility];  Service: Urology;  Laterality: N/A;        Home Medications    Prior to Admission medications   Medication Sig Start Date End Date Taking? Authorizing Provider  acetaminophen (TYLENOL) 325 MG tablet Take 2 tablets (650 mg total) by mouth every 6 (six) hours as needed for mild pain (or Fever >/= 101). Patient not taking: Reported on 09/06/2018 08/02/18   Raiford Noble Latif, DO  alum & mag hydroxide-simeth (MAALOX/MYLANTA) 200-200-20 MG/5ML suspension Take 15 mLs by mouth every 6 (six) hours as needed for indigestion or heartburn. Patient not taking: Reported on 09/06/2018 08/02/18   Raiford Noble Latif, DO  chlorhexidine (PERIDEX) 0.12 % solution 15 mLs by Mouth Rinse route 2 (two) times daily. 09/13/18   Nita Sells, MD  cyanocobalamin (,VITAMIN B-12,) 1000 MCG/ML injection Inject 1,000 mcg into the muscle every 30 (thirty) days.    [provider]  dexamethasone (DECADRON) 2 MG tablet Take 1 tablet (2 mg total) by mouth daily. 09/14/18   Nita Sells, MD  dronabinol (MARINOL) 2.5 MG capsule Take 1 capsule (2.5 mg total) by mouth 2 (two) times daily before lunch and supper. 09/30/18   [name], MD  DULoxetine (CYMBALTA) 30 MG capsule Take 1 capsule (30 mg total) by mouth daily. 08/29/18   [name], MD  fentaNYL (DURAGESIC) 100 MCG/HR Place 1 patch onto the skin every 3 (three) days. 10/08/18   [name], MD  HYDROmorphone (DILAUDID) 2 MG tablet Take 1 tablet (2 mg total) by mouth every 4 (four) hours as needed for severe pain. 08/28/18   [name], MD  levofloxacin (LEVAQUIN) 500 MG tablet Take 1 tablet (500 mg total) by mouth daily at 3 pm. 09/13/18   Nita Sells, MD   lidocaine-prilocaine (EMLA) cream Apply 1 application topically as needed. 08/05/18   [name], MD  magic mouthwash w/lidocaine SOLN Take 15 mLs by mouth 4 (four) times daily. 09/25/18   [name], MD  megestrol (MEGACE) 40 MG/ML suspension TAKE TWO TEASPOONFULS (10 ML) BY MOUTH TWICE DAILY 09/17/18   [name], MD  Multiple Vitamin (MULTIVITAMIN WITH MINERALS) TABS tablet Take 1 tablet by mouth daily.    [provider]  ondansetron (ZOFRAN) 4 MG tablet Take 1 tablet (4 mg total) by mouth every 6 (six) hours as needed for nausea. 08/29/18   [name], MD  pantoprazole (PROTONIX) 40 MG tablet TAKE 1 TABLET BY MOUTH EVERY DAY 10/09/18   [name], MD  polyethylene glycol [facility] /  GLYCOLAX) 17 g packet Take 17 g by mouth 2 (two) times daily. 08/02/18   Raiford Noble Latif, DO  prochlorperazine (COMPAZINE) 10 MG tablet Take 1 tablet (10 mg total) by mouth every 6 (six) hours as needed for nausea or vomiting. 09/25/18   [name], MD  senna-docusate (SENOKOT-S) 8.6-50 MG tablet Take 2 tablets by mouth 2 (two) times daily. 08/02/18   Kerney Elbe, DO    Family History Family History  Problem Relation Age of Onset  . Heart attack Father   . Hypertension Father   . Diabetes Father   . Heart failure Father   . Hypertension Mother   . Gastric cancer Sister 108       d. 56  . Prostate cancer Brother 52  . Melanoma Maternal Aunt   . Stroke Maternal Uncle   . Stroke Paternal Uncle   . Melanoma Maternal Grandmother        dx under 63  . Colon cancer Maternal Grandmother   . Heart disease Maternal Grandfather   . Heart attack Paternal Grandfather     Social History Social History   Tobacco Use  . Smoking status: Former Smoker    Packs/day: 2.00    Years: 8.00    Pack years: 16.00    Types: Cigarettes    Quit date: 05/29/2016    Years since quitting: 2.3  . Smokeless tobacco: Former Systems developer  . Tobacco comment: quti 2009  Substance Use Topics  .  Alcohol use: Not Currently  . Drug use: Not Currently    Types: Marijuana    Comment: last use was 05-29-2017     Allergies   Morphine and related and Vicodin [hydrocodone-acetaminophen]   Review of Systems Review of Systems  Constitutional: Negative for chills and fever.  HENT: Negative for ear pain and sore throat.   Eyes: Negative for pain and visual disturbance.  Respiratory: Positive for cough and shortness of breath.   Cardiovascular: Positive for chest pain. Negative for palpitations.  Gastrointestinal: Negative for abdominal pain and vomiting.  Genitourinary: Negative for dysuria and hematuria.  Musculoskeletal: Positive for back pain. Negative for arthralgias.  Skin: Negative for color change and rash.  Neurological: Negative for seizures and syncope.  All other systems reviewed and are negative.    Physical Exam Updated Vital Signs BP (!) 133/93 (BP Location: Left Arm)   Pulse (!) 127   Temp 97.7 F (36.5 C) (Oral)   Resp 19   SpO2 99%   Physical Exam Vitals signs and nursing note reviewed.  Constitutional:      Appearance: He is well-developed.  HENT:     Head: Normocephalic and atraumatic.  Eyes:     Conjunctiva/sclera: Conjunctivae normal.  Neck:     Musculoskeletal: Neck supple.  Cardiovascular:     Rate and Rhythm: Normal rate and regular rhythm.     Heart sounds: No murmur.  Pulmonary:     Breath sounds: Normal breath sounds.     Comments: Mild tachypnea but no distress Abdominal:     Palpations: Abdomen is soft.     Tenderness: There is no abdominal tenderness.  Musculoskeletal:     Right lower leg: No edema.     Left lower leg: No edema.     Comments: Midline T-spine tenderness, no L-spine tenderness, no C-spine tenderness  Skin:    General: Skin is warm and dry.     Capillary Refill: Capillary refill takes less than 2 seconds.  Neurological:  General: No focal deficit present.     Mental Status: He is alert and oriented to person,  place, and time.      ED Treatments / Results  Labs (all labs ordered are listed, but only abnormal results are displayed) Labs Reviewed  CBC - Abnormal; Notable for the following components:      Result Value   WBC 2.6 (*)    RBC 2.71 (*)    Hemoglobin 8.8 (*)    HCT 25.9 (*)    RDW 17.1 (*)    Platelets 125 (*)    nRBC 12.9 (*)    All other components within normal limits  URINALYSIS, ROUTINE W REFLEX MICROSCOPIC - Abnormal; Notable for the following components:   Color, Urine AMBER (*)    APPearance TURBID (*)    pH 9.0 (*)    Hgb urine dipstick MODERATE (*)    Protein, ur 30 (*)    Leukocytes,Ua LARGE (*)    WBC, UA >50 (*)    Bacteria, UA FEW (*)    All other components within normal limits  LIPASE, BLOOD  COMPREHENSIVE METABOLIC PANEL  TROPONIN I (HIGH SENSITIVITY)    EKG None  Radiology No results found.  Procedures .Critical Care Performed by: Lucrezia Starch, MD Authorized by: Lucrezia Starch, MD   Critical care provider statement:    Critical care time (minutes):  45   Critical care was necessary to treat or prevent imminent or life-threatening deterioration of the following conditions:  Respiratory failure   Critical care was time spent personally by me on the following activities:  Discussions with consultants, evaluation of patient's response to treatment, examination of patient, ordering and performing treatments and interventions, ordering and review of laboratory studies, ordering and review of radiographic studies, pulse oximetry, re-evaluation of patient's condition, obtaining history from patient or surrogate and review of old charts   (including critical care time)  Medications Ordered in ED Medications - No data to display   Initial Impression / Assessment and Plan / ED Course  I have reviewed the triage vital signs and the nursing notes.  Pertinent labs & imaging results that were available during my care of the patient were  reviewed by me and considered in my medical decision making (see chart for details).  48 year old male with metastatic prostate cancer presents to ER with chest pain, back pain and shortness of breath as well as nausea and vomiting.  Work-up today was concerning for new pathologic compression fractures in his thoracic spine as well as new small bilateral pulmonary emboli.  CT abdomen was negative.  Regarding his T-spine fractures, no associated trauma, no associated neurologic deficits, no canal stenosis from fractures.  Patient having significant pain requiring multiple doses of IV pain medication for this.  Regarding pulmonary emboli, no prior history, suspect this is the etiology of his chest pain and difficulty breathing.  Mild elevation troponin likely 2/2 PE, stable blood pressure. Will start on heparin.  Consult hospitalist for admission, Dr. Hal Hope accepting to tele bed.  Final Clinical Impressions(s) / ED Diagnoses   Final diagnoses:  Acute pulmonary embolism, unspecified pulmonary embolism type, unspecified whether acute cor pulmonale present [facility])  Pathological compression fracture of vertebra, initial encounter [facility])    ED Discharge Orders    None       Lucrezia Starch, MD 10/12/18 [phone]

## 2018-10-11 NOTE — Progress Notes (Signed)
ANTICOAGULATION CONSULT NOTE - Initial Consult  Pharmacy Consult for Heparin Indication: pulmonary embolus  Allergies  Allergen Reactions  . Morphine And Related Itching  . Vicodin [Hydrocodone-Acetaminophen] Itching    Patient Measurements: Height: 5\' 10"  (177.8 cm) Weight: 145 lb (65.8 kg) IBW/kg (Calculated) : 73 Heparin Dosing Weight:   Vital Signs: Temp: 97.7 F (36.5 C) (09/25 1556) Temp Source: Oral (09/25 1556) BP: 116/88 (09/25 2051) Pulse Rate: 125 (09/25 2051)  Labs: Recent Labs    10/11/18 1610 10/11/18 1803  HGB 8.8*  --   HCT 25.9*  --   PLT 125*  --   CREATININE 0.64  --   TROPONINIHS 27* 31*    Estimated Creatinine Clearance: 102.8 mL/min (by C-G formula based on SCr of 0.64 mg/dL).   Medical History: Past Medical History:  Diagnosis Date  . Anxiety   . Bladder tumor   . Blood in urine    saw some 3 days ago but none inthe last 2 days   . Bradycardia   . Depression   . Dizziness    historical   . Elevated PSA   . Family history of gastric cancer   . Heartburn    occasional   . Pain with urination   . Prostate cancer ([place])   . Urothelial cancer ([place])     Medications:  Infusions:  . heparin      Assessment: Patient with new PE noted on imaging.  MD wants heparin per pharmacy.  No oral anticoagulants noted on med rec. Baseline coags ordered.  Goal of Therapy:  Heparin level 0.3-0.7 units/ml Monitor platelets by anticoagulation protocol: Yes   Plan:  Heparin bolus 4000  units iv x1 Heparin drip at 1350  units/hr Daily CBC Next heparin level at 0600    Tyler Deis, Shea Stakes [place] 10/11/2018,9:37 PM

## 2018-10-11 NOTE — ED Notes (Signed)
Patient refusing to get in bed from wheelchair. Remains in wheelchair at this time.

## 2018-10-11 NOTE — Progress Notes (Signed)
5E New Admission Note:    Arrival Method: Bed Mental Orientation: AXO X4  Telemetry: box 55 Assessment: Skin: dry, flaky Iv: Port  Pain: 9/10 stated he would wait on pain medicine Tubes: condom cath Safety Measures: Safety Fall Prevention Plan has been given, discussed and signed Admission: Completed [facility] Orientation: Patient has been orientated to the room, unit, and staff.  Family: wife went home  Orders have been reviewed and implemented. Will continue to monitor the patient. Call light has been placed within reach and bed alarm has been activated.   Tawni Carnes, RN  Today's Vitals   10/11/18 2227 10/11/18 2230 10/11/18 2305 10/11/18 2315  BP:  (!) 111/91 (!) 114/99   Pulse:  99 94   Resp:  13 17   Temp:   97.6 F (36.4 C)   TempSrc:   Oral   SpO2:  97% 100%   Weight: 63.5 kg     Height: 6\' 2"  (1.88 m)     PainSc:    9    Body mass index is 17.97 kg/m.

## 2018-10-11 NOTE — ED Triage Notes (Signed)
Pt having vomiting and right side pains since Wed.

## 2018-10-11 NOTE — ED Notes (Signed)
ED TO INPATIENT HANDOFF REPORT  Name/Age/Gender Rodney [name] 48 y.o. male  Code Status Code Status History    Date Active Date Inactive Code Status Order ID Comments User Context   09/07/2018 0505 09/13/2018 1511 Full Code [id number]  Elwyn Reach, MD ED   07/22/2018 1558 08/02/2018 2326 Full Code [id number]  Micheline Rough, MD Inpatient   07/22/2018 1327 07/22/2018 1558 DNR [id number]  Georgette Shell, MD Inpatient   07/20/2018 2217 07/22/2018 1327 Full Code [id number]  Norval Morton, MD ED   11/08/2017 1740 11/14/2017 1823 Full Code [id number]  Raynelle Bring, MD Inpatient   Advance Care Planning Activity      Home/SNF/Other Home  Chief Complaint Vomiting, chest pain  Level of Care/Admitting Diagnosis ED Disposition    ED Disposition Condition [place]: [facility] [id number]  Level of Care: Telemetry [5]  Admit to tele based on following criteria: Monitor for Ischemic changes  Covid Evaluation: Asymptomatic Screening Protocol (No Symptoms)  Diagnosis: Pulmonary embolism ([place]) [id number]  Admitting Physician: Rise Patience [phone]  Attending Physician: Rise Patience [email or web address]  PT Class (Do Not Modify): Observation [104]  PT Acc Code (Do Not Modify): Observation [10022]       Medical History Past Medical History:  Diagnosis Date  . Anxiety   . Bladder tumor   . Blood in urine    saw some 3 days ago but none inthe last 2 days   . Bradycardia   . Depression   . Dizziness    historical   . Elevated PSA   . Family history of gastric cancer   . Heartburn    occasional   . Pain with urination   . Prostate cancer ([place])   . 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 Morphine And Related Itching  . Vicodin [Hydrocodone-Acetaminophen] Itching    IV Location/Drains/Wounds Patient Lines/Drains/Airways Status   Active Line/Drains/Airways    Name:   Placement date:   Placement time:    Site:   Days:   Implanted Port 08/07/18 Right   08/07/18    0805    -   65   External Urinary Catheter   09/07/18    0239    -   34   Incision (Closed) 07/22/18 Hip Left;Posterior   07/22/18    1245     81          Labs/Imaging Results for orders placed or performed during the hospital encounter of 10/11/18 (from the past 48 hour(s))  Lipase, blood     Status: None   Collection Time: 10/11/18  4:10 PM  Result Value Ref Range   Lipase 25 11 - 51 U/L    Comment: Performed at [facility], [place] [address] [place], [place] [address]  Comprehensive metabolic panel     Status: Abnormal   Collection Time: 10/11/18  4:10 PM  Result Value Ref Range   Sodium 136 135 - 145 mmol/L   Potassium 4.2 3.5 - 5.1 mmol/L   Chloride 105 98 - 111 mmol/L   CO2 16 (L) 22 - 32 mmol/L   Glucose, Bld 130 (H) 70 - 99 mg/dL   BUN 24 (H) 6 - 20 mg/dL   Creatinine, Ser 0.64 0.61 - 1.24 mg/dL   Calcium 10.5 (H) 8.9 - 10.3 mg/dL   Total Protein 7.1 6.5 - 8.1 g/dL   Albumin 3.9 3.5 -  5.0 g/dL   AST 34 15 - 41 U/L   ALT 40 0 - 44 U/L   Alkaline Phosphatase 216 (H) 38 - 126 U/L   Total Bilirubin 0.5 0.3 - 1.2 mg/dL   GFR calc non Af Amer >60 >60 mL/min   GFR calc Af Amer >60 >60 mL/min   Anion gap 15 5 - 15    Comment: Performed at [facility], [place] [address] [place], [place] [address]  CBC     Status: Abnormal   Collection Time: 10/11/18  4:10 PM  Result Value Ref Range   WBC 2.6 (L) 4.0 - 10.5 K/uL   RBC 2.71 (L) 4.22 - 5.81 MIL/uL   Hemoglobin 8.8 (L) 13.0 - 17.0 g/dL   HCT 25.9 (L) 39.0 - 52.0 %   MCV 95.6 80.0 - 100.0 fL   MCH 32.5 26.0 - 34.0 pg   MCHC 34.0 30.0 - 36.0 g/dL   RDW 17.1 (H) 11.5 - 15.5 %   Platelets 125 (L) 150 - 400 K/uL   nRBC 12.9 (H) 0.0 - 0.2 %    Comment: Performed at [facility], [place] [address], [place], [place] [address]  Troponin I (High Sensitivity)     Status: Abnormal   Collection Time: 10/11/18  4:10 PM   Result Value Ref Range   Troponin I (High Sensitivity) 27 (H) <18 ng/L    Comment: (NOTE) Elevated high sensitivity troponin I (hsTnI) values and significant  changes across serial measurements may suggest ACS but many other  chronic and acute conditions are known to elevate hsTnI results.  Refer to the "Links" section for chest pain algorithms and additional  guidance. Performed at [facility], [place] [address], [place], [place] [address]   Differential     Status: Abnormal   Collection Time: 10/11/18  4:10 PM  Result Value Ref Range   Neutrophils Relative % 57 %   Neutro Abs 1.5 (L) 1.7 - 7.7 K/uL   Lymphocytes Relative 26 %   Lymphs Abs 0.7 0.7 - 4.0 K/uL   Monocytes Relative 15 %   Monocytes Absolute 0.4 0.1 - 1.0 K/uL   Eosinophils Relative 0 %   Eosinophils Absolute 0.0 0.0 - 0.5 K/uL   Basophils Relative 0 %   Basophils Absolute 0.0 0.0 - 0.1 K/uL   Immature Granulocytes 2 %   Abs Immature Granulocytes 0.06 0.00 - 0.07 K/uL    Comment: Performed at [facility], [place] [address] [place], [place] [address]  Magnesium     Status: None   Collection Time: 10/11/18  4:10 PM  Result Value Ref Range   Magnesium 2.0 1.7 - 2.4 mg/dL    Comment: Performed at [facility], [place] [address], [place], [place] [address]  Urinalysis, Routine w reflex microscopic     Status: Abnormal   Collection Time: 10/11/18  4:23 PM  Result Value Ref Range   Color, Urine AMBER (A) YELLOW    Comment: BIOCHEMICALS MAY BE AFFECTED BY COLOR   APPearance TURBID (A) CLEAR   Specific Gravity, Urine 1.006 1.005 - 1.030   pH 9.0 (H) 5.0 - 8.0   Glucose, UA NEGATIVE NEGATIVE mg/dL   Hgb urine dipstick MODERATE (A) NEGATIVE   Bilirubin Urine NEGATIVE NEGATIVE   Ketones, ur NEGATIVE NEGATIVE mg/dL   Protein, ur 30 (A) NEGATIVE mg/dL   Nitrite NEGATIVE NEGATIVE   Leukocytes,Ua LARGE (A) NEGATIVE   RBC / HPF 21-50  0 - 5 RBC/hpf   WBC, UA >50 (H) 0 -  5 WBC/hpf   Bacteria, UA FEW (A) NONE SEEN   Squamous Epithelial / LPF 6-10 0 - 5   Amorphous Crystal PRESENT     Comment: Performed at [facility], [place] [address] [place], [place] [address]  Troponin I (High Sensitivity)     Status: Abnormal   Collection Time: 10/11/18  6:03 PM  Result Value Ref Range   Troponin I (High Sensitivity) 31 (H) <18 ng/L    Comment: (NOTE) Elevated high sensitivity troponin I (hsTnI) values and significant  changes across serial measurements may suggest ACS but many other  chronic and acute conditions are known to elevate hsTnI results.  Refer to the "Links" section for chest pain algorithms and additional  guidance. Performed at [facility], [place] [address] [place], [place] [address]    Dg Chest 2 View  Result Date: 10/11/2018 CLINICAL DATA:  Pt reported vomiting and right side pains since Wednesday. Pt also reported some SOB. Medical hx of bladder cancer. Former smoker. EXAM: CHEST - 2 VIEW COMPARISON:  Chest radiograph 09/06/2018 FINDINGS: Stable cardiomediastinal contours. Right chest Port-A-Cath tip terminates at the cavoatrial junction. The lungs are clear. No pneumothorax or pleural effusion. Numerous osseous lesions consistent with known metastasis are better appreciated on prior CT. Vertebral wedge compression deformities appears similar to prior. IMPRESSION: No acute cardiopulmonary process. Osseous metastatic disease better characterized on prior CT. Electronically Signed   By: Audie Pinto M.D.   On: 10/11/2018 16:58   Ct Angio Chest Pe W And/or Wo Contrast  Result Date: 10/11/2018 CLINICAL DATA:  Severe chest pain. History of metastatic prostate cancer. EXAM: CT ANGIOGRAPHY CHEST CT ABDOMEN AND PELVIS WITH CONTRAST TECHNIQUE: Multidetector CT imaging of the chest was performed using the standard protocol during bolus administration of intravenous contrast. Multiplanar CT image reconstructions and MIPs  were obtained to evaluate the vascular anatomy. Multidetector CT imaging of the abdomen and pelvis was performed using the standard protocol during bolus administration of intravenous contrast. CONTRAST:  164mL OMNIPAQUE IOHEXOL 350 MG/ML SOLN COMPARISON:  Chest CT 08/02/2018 and CT abdomen 07/20/2018 FINDINGS: CTA CHEST FINDINGS Cardiovascular: The heart is normal in size. No pericardial effusion. The aorta is normal in caliber. No dissection or atherosclerotic calcifications. Few scattered coronary artery calcifications are noted. Right-sided Port-A-Cath in good position with its tip near the cavoatrial junction. The pulmonary arterial tree is well opacified. There are small bilateral pulmonary emboli in the lower lobes. No large central pulmonary emboli. Mediastinum/Nodes: No mediastinal or hilar mass or lymphadenopathy. The mediastinal and hilar adenopathy seen on the prior study has resolved. Mildly dilated esophagus is noted. Lungs/Pleura: Patchy areas of subsegmental atelectasis and scarring changes. No worrisome pulmonary lesions to suggest pulmonary metastatic disease. Musculoskeletal: Numerous bilateral metastatic rib lesions but these are much improved when compared to the prior examination. The large soft tissue mass is associated with metastasis have resolved and the lesions appear healed or healing. I do not see any new destructive bony changes. The left medial clavicle region also appears improved. The proximal sternal lesion also demonstrates some healing changes. Extensive metastatic disease involving the thoracic spine also with some interval improvement/healing. No new lesions. Pathologic compression fractures are noted in the midthoracic spine. These are new since the prior study. This mainly involves the T8 and T9 vertebral bodies. There is significant disease in these vertebral bodies on the prior study. 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 Review of the MIP images confirms the above  findings. CT ABDOMEN and PELVIS FINDINGS Hepatobiliary: No focal hepatic lesions to suggest recurrent metastatic disease. The numerous small hepatic lesions have resolved. The gallbladder is normal. No common bile duct dilatation. Pancreas: No mass, inflammation or ductal dilatation. Spleen: Normal size.  No focal lesions. Adrenals/Urinary Tract: The adrenal glands and kidneys are unremarkable and stable. Small bilateral renal cysts. The neobladder is moderately distended. Stomach/Bowel: The stomach, duodenum, small bowel and colon are grossly normal. No acute inflammatory changes, mass lesions or obstructive findings. Vascular/Lymphatic: The aorta and branch vessels are normal. The major venous structures are patent. No mesenteric or retroperitoneal mass or adenopathy. Reproductive: Status post prostatectomy and cystectomy with neobladder. Other: No free pelvic fluid collections or pelvic abscess. No inguinal adenopathy. Musculoskeletal: Improved appearance of the large pelvic metastatic lesions. The soft tissue masses are smaller and the bone lesions demonstrate some healing changes. Review of the MIP images confirms the above findings. IMPRESSION: 1. Small bilateral pulmonary emboli. 2. Much improved CT appearance of the chest overall. The mediastinal and hilar adenopathy has resolved and the metastatic rib, sternal and left clavicle lesions appear improved. 3. Persistent significant metastatic disease involving the thoracic spine but there is also evidence of some improvement and no progressive findings. New pathologic compression fractures of T8 and T9. No spinal canal compromise. 4. Resolution of hepatic metastatic disease. 5. Persistent lumbar spine and pelvic metastatic bone lesions but some of the show slight improvement with less significant soft tissue components and some bony healing changes. 6. Status post cysto prostatectomy with neobladder. No complicating features. No abdominal or pelvic  lymphadenopathy. Electronically Signed   By: Marijo Sanes M.D.   On: 10/11/2018 19:44   Ct Abdomen Pelvis W Contrast  Result Date: 10/11/2018 CLINICAL DATA:  Severe chest pain. History of metastatic prostate cancer. EXAM: CT ANGIOGRAPHY CHEST CT ABDOMEN AND PELVIS WITH CONTRAST TECHNIQUE: Multidetector CT imaging of the chest was performed using the standard protocol during bolus administration of intravenous contrast. Multiplanar CT image reconstructions and MIPs were obtained to evaluate the vascular anatomy. Multidetector CT imaging of the abdomen and pelvis was performed using the standard protocol during bolus administration of intravenous contrast. CONTRAST:  158mL OMNIPAQUE IOHEXOL 350 MG/ML SOLN COMPARISON:  Chest CT 08/02/2018 and CT abdomen 07/20/2018 FINDINGS: CTA CHEST FINDINGS Cardiovascular: The heart is normal in size. No pericardial effusion. The aorta is normal in caliber. No dissection or atherosclerotic calcifications. Few scattered coronary artery calcifications are noted. Right-sided Port-A-Cath in good position with its tip near the cavoatrial junction. The pulmonary arterial tree is well opacified. There are small bilateral pulmonary emboli in the lower lobes. No large central pulmonary emboli. Mediastinum/Nodes: No mediastinal or hilar mass or lymphadenopathy. The mediastinal and hilar adenopathy seen on the prior study has resolved. Mildly dilated esophagus is noted. Lungs/Pleura: Patchy areas of subsegmental atelectasis and scarring changes. No worrisome pulmonary lesions to suggest pulmonary metastatic disease. Musculoskeletal: Numerous bilateral metastatic rib lesions but these are much improved when compared to the prior examination. The large soft tissue mass is associated with metastasis have resolved and the lesions appear healed or healing. I do not see any new destructive bony changes. The left medial clavicle region also appears improved. The proximal sternal lesion also  demonstrates some healing changes. Extensive metastatic disease involving the thoracic spine also with some interval improvement/healing. No new lesions. Pathologic compression fractures are noted in the midthoracic spine. These are new since the  prior study. This mainly involves the T8 and T9 vertebral bodies. There is significant disease in these vertebral bodies on the prior study. 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 Review of the MIP images confirms the above findings. CT ABDOMEN and PELVIS FINDINGS Hepatobiliary: No focal hepatic lesions to suggest recurrent metastatic disease. The numerous small hepatic lesions have resolved. The gallbladder is normal. No common bile duct dilatation. Pancreas: No mass, inflammation or ductal dilatation. Spleen: Normal size.  No focal lesions. Adrenals/Urinary Tract: The adrenal glands and kidneys are unremarkable and stable. Small bilateral renal cysts. The neobladder is moderately distended. Stomach/Bowel: The stomach, duodenum, small bowel and colon are grossly normal. No acute inflammatory changes, mass lesions or obstructive findings. Vascular/Lymphatic: The aorta and branch vessels are normal. The major venous structures are patent. No mesenteric or retroperitoneal mass or adenopathy. Reproductive: Status post prostatectomy and cystectomy with neobladder. Other: No free pelvic fluid collections or pelvic abscess. No inguinal adenopathy. Musculoskeletal: Improved appearance of the large pelvic metastatic lesions. The soft tissue masses are smaller and the bone lesions demonstrate some healing changes. Review of the MIP images confirms the above findings. IMPRESSION: 1. Small bilateral pulmonary emboli. 2. Much improved CT appearance of the chest overall. The mediastinal and hilar adenopathy has resolved and the metastatic rib, sternal and left clavicle lesions appear improved. 3. Persistent significant metastatic disease involving the thoracic spine but there  is also evidence of some improvement and no progressive findings. New pathologic compression fractures of T8 and T9. No spinal canal compromise. 4. Resolution of hepatic metastatic disease. 5. Persistent lumbar spine and pelvic metastatic bone lesions but some of the show slight improvement with less significant soft tissue components and some bony healing changes. 6. Status post cysto prostatectomy with neobladder. No complicating features. No abdominal or pelvic lymphadenopathy. Electronically Signed   By: Marijo Sanes M.D.   On: 10/11/2018 19:44    Pending Labs Unresulted Labs (From admission, onward)    Start     Ordered   10/12/18 0600  Heparin level (unfractionated)  Once-Timed,   STAT     10/11/18 2132   10/12/18 0500  CBC  Daily,   R     10/11/18 2131   10/11/18 2147  SARS CORONAVIRUS 2 (TAT 6-24 HRS) Nasopharyngeal Nasopharyngeal Swab  (Asymptomatic/Tier 2 Patients Labs)  Once,   STAT    Question Answer Comment  Is this test for diagnosis or screening Screening   Symptomatic for COVID-19 as defined by CDC No   Hospitalized for COVID-19 No   Admitted to ICU for COVID-19 No   Previously tested for COVID-19 Yes   Resident in a congregate (group) care setting No   Employed in healthcare setting No      10/11/18 2146   10/11/18 2126  Protime-INR  Once-Timed,   STAT     10/11/18 2125   10/11/18 2126  APTT  Once,   STAT     10/11/18 2125   10/11/18 2120  Brain natriuretic peptide  Add-on,   AD     10/11/18 2120          Vitals/Pain Today's Vitals   10/11/18 2145 10/11/18 2215 10/11/18 2227 10/11/18 2230  BP: 105/87 (!) 120/98  (!) 111/91  Pulse: (!) 116 (!) 110  99  Resp: 16 (!) 23  13  Temp:      TempSrc:      SpO2: 99% 100%  97%  Weight:   63.5 kg   Height:  6\' 2"  (1.88 m)   PainSc:        Isolation Precautions No active isolations  Medications Medications  sodium chloride (PF) 0.9 % injection (has no administration in time range)  heparin bolus via infusion  4,000 Units (has no administration in time range)  heparin ADULT infusion 100 units/mL (25000 units/273mL sodium chloride 0.45%) (has no administration in time range)  ondansetron (ZOFRAN) injection 4 mg (4 mg Intravenous Given 10/11/18 1808)  cefTRIAXone (ROCEPHIN) 1 g in sodium chloride 0.9 % 100 mL IVPB (0 g Intravenous Stopped 10/11/18 2026)  sodium chloride 0.9 % bolus 1,000 mL (0 mLs Intravenous Stopped 10/11/18 2227)  fentaNYL (SUBLIMAZE) injection 50 mcg (50 mcg Intravenous Given 10/11/18 1819)  iohexol (OMNIPAQUE) 350 MG/ML injection 100 mL (100 mLs Intravenous Contrast Given 10/11/18 1857)  fentaNYL (SUBLIMAZE) injection 50 mcg (50 mcg Intravenous Given 10/11/18 2027)  HYDROmorphone (DILAUDID) injection 1 mg (1 mg Intravenous Given 10/11/18 2143)    Mobility walks with person assist

## 2018-10-11 NOTE — ED Notes (Signed)
Patient transported to CT 

## 2018-10-11 NOTE — ED Notes (Signed)
Patient requesting additional pain medication. MD made aware. 

## 2018-10-11 NOTE — H&P (Addendum)
History and Physical    Rodney [name] [id number] DOB: 08/29/1970 DOA: 10/11/2018  PCP: Curlene Labrum, MD  Patient coming from: Home.  Chief Complaint: Chest pain nausea vomiting.  HPI: Rodney [name] is a 48 y.o. 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 coli bacteremia presents to the ER with complaints of 2 days of increasing nausea vomiting with chest pain.  Patient chest pain is mostly in the right side constant at times pleuritic in nature.  Pain also at times radiates to the back towards the spine.  Denies any associated shortness of breath productive cough fever chills.  Patient states he has been having persistent nausea vomiting unable to keep in last 2 days.  Denies any diarrhea.  No abdominal pain.  ED Course: In the ER patient had CT angiogram of the chest and also had abdomen pelvis CT.  Shows bilateral small pleural embolism with no strain pattern.  Labs show calcium of 10.5 WBC 2.6 hemoglobin 8.8 platelets 125 high-sensitivity troponin XX 7 BNP 92.7 UA concerning for UTI and COVID-19 test is pending.  Patient was started on heparin for the form embolism and ceftriaxone for UTI.  At the time of my exam patient is able to tolerate clear liquids.  CT scan also showed compression fracture of the T8 and T9 with no cord involvement.  Review of Systems: As per HPI, rest all negative.   Past Medical History:  Diagnosis Date   Anxiety    Bladder tumor    Blood in urine    saw some 3 days ago but none inthe last 2 days    Bradycardia    Depression    Dizziness    historical    Elevated PSA    Family history of gastric cancer    Heartburn    occasional    Pain with urination    Prostate cancer ([place])    Urothelial cancer [facility])     Past Surgical History:  Procedure Laterality Date   BACK SURGERY  2012   3 disc; dr Carloyn Manner  did first and dr elsner did the last 2    CYSTOSCOPY W/  RETROGRADES N/A 07/02/2017   Procedure: CYSTOSCOPY WITH RETROGRADE PYELOGRAM/EXAM UNDER ANESTHESIA;  Surgeon: Raynelle Bring, MD;  Location: [facility];  Service: Urology;  Laterality: N/A;   CYSTOSCOPY WITH BIOPSY N/A 08/09/2017   Procedure: CYSTOSCOPY WITH PROSTATE NEEDLE BIOPSY;  Surgeon: Raynelle Bring, MD;  Location: [facility];  Service: Urology;  Laterality: N/A;  GENERAL ANESTHESIA WITH PARALYSIS/ ONLY NEEDS 60 MIN FOR ALL PROCEDURES   IR IMAGING GUIDED PORT INSERTION  08/07/2018   LEG SURGERY  2012   4 metal plates in plates    TRANSRECTAL ULTRASOUND N/A 08/09/2017   Procedure: TRANSRECTAL ULTRASOUND;  Surgeon: Raynelle Bring, MD;  Location: [facility];  Service: Urology;  Laterality: N/A;  ONLY NEEDS 60 MIN FOR ALL PROCEDURES   TRANSURETHRAL RESECTION OF BLADDER TUMOR N/A 07/02/2017   Procedure: TRANSURETHRAL RESECTION OF BLADDER TUMOR (TURBT);  Surgeon: Raynelle Bring, MD;  Location: [facility];  Service: Urology;  Laterality: N/A;   TRANSURETHRAL RESECTION OF BLADDER TUMOR N/A 08/09/2017   Procedure: TRANSURETHRAL RESECTION OF BLADDER TUMOR (TURBT);  Surgeon: Raynelle Bring, MD;  Location: [facility];  Service: Urology;  Laterality: N/A;     reports that he quit smoking about 2 years ago. His smoking use included cigarettes. He has a 16.00 pack-year smoking history. He has  quit using smokeless tobacco. He reports previous alcohol use. He reports previous drug use. Drug: Marijuana.  Allergies  Allergen Reactions   Morphine And Related Itching   Vicodin [Hydrocodone-Acetaminophen] Itching    Family History  Problem Relation Age of Onset   Heart attack Father    Hypertension Father    Diabetes Father    Heart failure Father    Hypertension Mother    Gastric cancer Sister 33       d. 71   Prostate cancer Brother 1   Melanoma Maternal Aunt    Stroke Maternal Uncle    Stroke Paternal Uncle    Melanoma Maternal Grandmother        dx under 85   Colon cancer Maternal Grandmother      Heart disease Maternal Grandfather    Heart attack Paternal Grandfather     Prior to Admission medications   Medication Sig Start Date End Date Taking? Authorizing Provider  cyanocobalamin (,VITAMIN B-12,) 1000 MCG/ML injection Inject 1,000 mcg into the muscle every 30 (thirty) days.   Yes [provider]  dexamethasone (DECADRON) 2 MG tablet Take 1 tablet (2 mg total) by mouth daily. 09/14/18  Yes Nita Sells, MD  dronabinol (MARINOL) 2.5 MG capsule Take 1 capsule (2.5 mg total) by mouth 2 (two) times daily before lunch and supper. 09/30/18  Yes [name], MD  DULoxetine (CYMBALTA) 30 MG capsule Take 1 capsule (30 mg total) by mouth daily. 08/29/18  Yes [name], MD  fentaNYL (DURAGESIC) 100 MCG/HR Place 1 patch onto the skin every 3 (three) days. 10/08/18  Yes [name], MD  HYDROmorphone (DILAUDID) 2 MG tablet Take 1 tablet (2 mg total) by mouth every 4 (four) hours as needed for severe pain. Patient taking differently: Take 4 mg by mouth every 4 (four) hours as needed for severe pain.  08/28/18  Yes [name], MD  lidocaine-prilocaine (EMLA) cream Apply 1 application topically as needed. 08/05/18  Yes [name], MD  magic mouthwash w/lidocaine SOLN Take 15 mLs by mouth 4 (four) times daily. 09/25/18  Yes [name], MD  Multiple Vitamin (MULTIVITAMIN WITH MINERALS) TABS tablet Take 1 tablet by mouth daily.   Yes [provider]  pantoprazole (PROTONIX) 40 MG tablet TAKE 1 TABLET BY MOUTH EVERY DAY Patient taking differently: Take 40 mg by mouth daily.  10/09/18  Yes [name], MD  polyethylene glycol (MIRALAX / GLYCOLAX) 17 g packet Take 17 g by mouth 2 (two) times daily. Patient taking differently: Take 17 g by mouth daily as needed for mild constipation.  08/02/18  Yes Sheikh, Omair Latif, DO  prochlorperazine (COMPAZINE) 10 MG tablet Take 1 tablet (10 mg total) by mouth every 6 (six) hours as needed for nausea or vomiting.  09/25/18  Yes [name], MD  senna-docusate (SENOKOT-S) 8.6-50 MG tablet Take 2 tablets by mouth 2 (two) times daily. 08/02/18  Yes Sheikh, Omair Latif, DO  acetaminophen (TYLENOL) 325 MG tablet Take 2 tablets (650 mg total) by mouth every 6 (six) hours as needed for mild pain (or Fever >/= 101). Patient not taking: Reported on 09/06/2018 08/02/18   Raiford Noble Latif, DO  alum & mag hydroxide-simeth (MAALOX/MYLANTA) 200-200-20 MG/5ML suspension Take 15 mLs by mouth every 6 (six) hours as needed for indigestion or heartburn. Patient not taking: Reported on 09/06/2018 08/02/18   Raiford Noble Latif, DO  chlorhexidine (PERIDEX) 0.12 % solution 15 mLs by Mouth Rinse route  2 (two) times daily. Patient not taking: Reported on 10/11/2018 09/13/18   Nita Sells, MD  levofloxacin (LEVAQUIN) 500 MG tablet Take 1 tablet (500 mg total) by mouth daily at 3 pm. Patient not taking: Reported on 10/11/2018 09/13/18   Nita Sells, MD  megestrol (MEGACE) 40 MG/ML suspension TAKE TWO TEASPOONFULS (10 ML) BY MOUTH TWICE DAILY Patient not taking: Reported on 10/11/2018 09/17/18   [name], MD  ondansetron (ZOFRAN) 4 MG tablet Take 1 tablet (4 mg total) by mouth every 6 (six) hours as needed for nausea. Patient not taking: Reported on 10/11/2018 08/29/18   [name], MD    Physical Exam: Constitutional: Moderately built and nourished. Vitals:   10/11/18 2145 10/11/18 2215 10/11/18 2227 10/11/18 2230  BP: 105/87 (!) 120/98  (!) 111/91  Pulse: (!) 116 (!) 110  99  Resp: 16 (!) 23  13  Temp:      TempSrc:      SpO2: 99% 100%  97%  Weight:   63.5 kg   Height:   6\' 2"  (1.88 m)    Eyes: Anicteric no pallor. ENMT: No discharge from the ears eyes nose or mouth. Neck: No mass felt.  No neck rigidity. Respiratory: No rhonchi or crepitations. Cardiovascular: S1-S2 heard. Abdomen: Soft nontender bowel sounds present. Musculoskeletal: No edema. Skin: No rash. Neurologic: Alert awake oriented  to time place and person.  Moves all extremities. Psychiatric: Appears normal with normal affect.   Labs on Admission: I have personally reviewed following labs and imaging studies  CBC: Recent Labs  Lab 10/11/18 1610  WBC 2.6*  NEUTROABS 1.5*  HGB 8.8*  HCT 25.9*  MCV 95.6  PLT 0000000*   Basic Metabolic Panel: Recent Labs  Lab 10/11/18 1610  NA 136  K 4.2  CL 105  CO2 16*  GLUCOSE 130*  BUN 24*  CREATININE 0.64  CALCIUM 10.5*  MG 2.0   GFR: Estimated Creatinine Clearance: 99.2 mL/min (by C-G formula based on SCr of 0.64 mg/dL). Liver Function Tests: Recent Labs  Lab 10/11/18 1610  AST 34  ALT 40  ALKPHOS 216*  BILITOT 0.5  PROT 7.1  ALBUMIN 3.9   Recent Labs  Lab 10/11/18 1610  LIPASE 25   No results for input(s): AMMONIA in the last 168 hours. Coagulation Profile: No results for input(s): INR, PROTIME in the last 168 hours. Cardiac Enzymes: No results for input(s): CKTOTAL, CKMB, CKMBINDEX, TROPONINI in the last 168 hours. BNP (last 3 results) No results for input(s): PROBNP in the last 8760 hours. HbA1C: No results for input(s): HGBA1C in the last 72 hours. CBG: No results for input(s): GLUCAP in the last 168 hours. Lipid Profile: No results for input(s): CHOL, HDL, LDLCALC, TRIG, CHOLHDL, LDLDIRECT in the last 72 hours. Thyroid Function Tests: No results for input(s): TSH, T4TOTAL, FREET4, T3FREE, THYROIDAB in the last 72 hours. Anemia Panel: No results for input(s): VITAMINB12, FOLATE, FERRITIN, TIBC, IRON, RETICCTPCT in the last 72 hours. Urine analysis:    Component Value Date/Time   COLORURINE AMBER (A) 10/11/2018 1623   APPEARANCEUR TURBID (A) 10/11/2018 1623   LABSPEC 1.006 10/11/2018 1623   PHURINE 9.0 (H) 10/11/2018 1623   GLUCOSEU NEGATIVE 10/11/2018 1623   HGBUR MODERATE (A) 10/11/2018 1623   BILIRUBINUR NEGATIVE 10/11/2018 1623   KETONESUR NEGATIVE 10/11/2018 1623   PROTEINUR 30 (A) 10/11/2018 1623   NITRITE NEGATIVE 10/11/2018  1623   LEUKOCYTESUR LARGE (A) 10/11/2018 1623   Sepsis Labs: @LABRCNTIP (procalcitonin:4,lacticidven:4) )No results found  for this or any previous visit (from the past 240 hour(s)).   Radiological Exams on Admission: Dg Chest 2 View  Result Date: 10/11/2018 CLINICAL DATA:  Pt reported vomiting and right side pains since Wednesday. Pt also reported some SOB. Medical hx of bladder cancer. Former smoker. EXAM: CHEST - 2 VIEW COMPARISON:  Chest radiograph 09/06/2018 FINDINGS: Stable cardiomediastinal contours. Right chest Port-A-Cath tip terminates at the cavoatrial junction. The lungs are clear. No pneumothorax or pleural effusion. Numerous osseous lesions consistent with known metastasis are better appreciated on prior CT. Vertebral wedge compression deformities appears similar to prior. IMPRESSION: No acute cardiopulmonary process. Osseous metastatic disease better characterized on prior CT. Electronically Signed   By: Audie Pinto M.D.   On: 10/11/2018 16:58   Ct Angio Chest Pe W And/or Wo Contrast  Result Date: 10/11/2018 CLINICAL DATA:  Severe chest pain. History of metastatic prostate cancer. EXAM: CT ANGIOGRAPHY CHEST CT ABDOMEN AND PELVIS WITH CONTRAST TECHNIQUE: Multidetector CT imaging of the chest was performed using the standard protocol during bolus administration of intravenous contrast. Multiplanar CT image reconstructions and MIPs were obtained to evaluate the vascular anatomy. Multidetector CT imaging of the abdomen and pelvis was performed using the standard protocol during bolus administration of intravenous contrast. CONTRAST:  14mL OMNIPAQUE IOHEXOL 350 MG/ML SOLN COMPARISON:  Chest CT 08/02/2018 and CT abdomen 07/20/2018 FINDINGS: CTA CHEST FINDINGS Cardiovascular: The heart is normal in size. No pericardial effusion. The aorta is normal in caliber. No dissection or atherosclerotic calcifications. Few scattered coronary artery calcifications are noted. Right-sided Port-A-Cath in  good position with its tip near the cavoatrial junction. The pulmonary arterial tree is well opacified. There are small bilateral pulmonary emboli in the lower lobes. No large central pulmonary emboli. Mediastinum/Nodes: No mediastinal or hilar mass or lymphadenopathy. The mediastinal and hilar adenopathy seen on the prior study has resolved. Mildly dilated esophagus is noted. Lungs/Pleura: Patchy areas of subsegmental atelectasis and scarring changes. No worrisome pulmonary lesions to suggest pulmonary metastatic disease. Musculoskeletal: Numerous bilateral metastatic rib lesions but these are much improved when compared to the prior examination. The large soft tissue mass is associated with metastasis have resolved and the lesions appear healed or healing. I do not see any new destructive bony changes. The left medial clavicle region also appears improved. The proximal sternal lesion also demonstrates some healing changes. Extensive metastatic disease involving the thoracic spine also with some interval improvement/healing. No new lesions. Pathologic compression fractures are noted in the midthoracic spine. These are new since the prior study. This mainly involves the T8 and T9 vertebral bodies. There is significant disease in these vertebral bodies on the prior study. 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 Review of the MIP images confirms the above findings. CT ABDOMEN and PELVIS FINDINGS Hepatobiliary: No focal hepatic lesions to suggest recurrent metastatic disease. The numerous small hepatic lesions have resolved. The gallbladder is normal. No common bile duct dilatation. Pancreas: No mass, inflammation or ductal dilatation. Spleen: Normal size.  No focal lesions. Adrenals/Urinary Tract: The adrenal glands and kidneys are unremarkable and stable. Small bilateral renal cysts. The neobladder is moderately distended. Stomach/Bowel: The stomach, duodenum, small bowel and colon are grossly normal. No  acute inflammatory changes, mass lesions or obstructive findings. Vascular/Lymphatic: The aorta and branch vessels are normal. The major venous structures are patent. No mesenteric or retroperitoneal mass or adenopathy. Reproductive: Status post prostatectomy and cystectomy with neobladder. Other: No free pelvic fluid collections or pelvic abscess. No inguinal adenopathy. Musculoskeletal:  Improved appearance of the large pelvic metastatic lesions. The soft tissue masses are smaller and the bone lesions demonstrate some healing changes. Review of the MIP images confirms the above findings. IMPRESSION: 1. Small bilateral pulmonary emboli. 2. Much improved CT appearance of the chest overall. The mediastinal and hilar adenopathy has resolved and the metastatic rib, sternal and left clavicle lesions appear improved. 3. Persistent significant metastatic disease involving the thoracic spine but there is also evidence of some improvement and no progressive findings. New pathologic compression fractures of T8 and T9. No spinal canal compromise. 4. Resolution of hepatic metastatic disease. 5. Persistent lumbar spine and pelvic metastatic bone lesions but some of the show slight improvement with less significant soft tissue components and some bony healing changes. 6. Status post cysto prostatectomy with neobladder. No complicating features. No abdominal or pelvic lymphadenopathy. Electronically Signed   By: Marijo Sanes M.D.   On: 10/11/2018 19:44   Ct Abdomen Pelvis W Contrast  Result Date: 10/11/2018 CLINICAL DATA:  Severe chest pain. History of metastatic prostate cancer. EXAM: CT ANGIOGRAPHY CHEST CT ABDOMEN AND PELVIS WITH CONTRAST TECHNIQUE: Multidetector CT imaging of the chest was performed using the standard protocol during bolus administration of intravenous contrast. Multiplanar CT image reconstructions and MIPs were obtained to evaluate the vascular anatomy. Multidetector CT imaging of the abdomen and pelvis  was performed using the standard protocol during bolus administration of intravenous contrast. CONTRAST:  150mL OMNIPAQUE IOHEXOL 350 MG/ML SOLN COMPARISON:  Chest CT 08/02/2018 and CT abdomen 07/20/2018 FINDINGS: CTA CHEST FINDINGS Cardiovascular: The heart is normal in size. No pericardial effusion. The aorta is normal in caliber. No dissection or atherosclerotic calcifications. Few scattered coronary artery calcifications are noted. Right-sided Port-A-Cath in good position with its tip near the cavoatrial junction. The pulmonary arterial tree is well opacified. There are small bilateral pulmonary emboli in the lower lobes. No large central pulmonary emboli. Mediastinum/Nodes: No mediastinal or hilar mass or lymphadenopathy. The mediastinal and hilar adenopathy seen on the prior study has resolved. Mildly dilated esophagus is noted. Lungs/Pleura: Patchy areas of subsegmental atelectasis and scarring changes. No worrisome pulmonary lesions to suggest pulmonary metastatic disease. Musculoskeletal: Numerous bilateral metastatic rib lesions but these are much improved when compared to the prior examination. The large soft tissue mass is associated with metastasis have resolved and the lesions appear healed or healing. I do not see any new destructive bony changes. The left medial clavicle region also appears improved. The proximal sternal lesion also demonstrates some healing changes. Extensive metastatic disease involving the thoracic spine also with some interval improvement/healing. No new lesions. Pathologic compression fractures are noted in the midthoracic spine. These are new since the prior study. This mainly involves the T8 and T9 vertebral bodies. There is significant disease in these vertebral bodies on the prior study. 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 Review of the MIP images confirms the above findings. CT ABDOMEN and PELVIS FINDINGS Hepatobiliary: No focal hepatic lesions to suggest  recurrent metastatic disease. The numerous small hepatic lesions have resolved. The gallbladder is normal. No common bile duct dilatation. Pancreas: No mass, inflammation or ductal dilatation. Spleen: Normal size.  No focal lesions. Adrenals/Urinary Tract: The adrenal glands and kidneys are unremarkable and stable. Small bilateral renal cysts. The neobladder is moderately distended. Stomach/Bowel: The stomach, duodenum, small bowel and colon are grossly normal. No acute inflammatory changes, mass lesions or obstructive findings. Vascular/Lymphatic: The aorta and branch vessels are normal. The major venous structures are patent. No  mesenteric or retroperitoneal mass or adenopathy. Reproductive: Status post prostatectomy and cystectomy with neobladder. Other: No free pelvic fluid collections or pelvic abscess. No inguinal adenopathy. Musculoskeletal: Improved appearance of the large pelvic metastatic lesions. The soft tissue masses are smaller and the bone lesions demonstrate some healing changes. Review of the MIP images confirms the above findings. IMPRESSION: 1. Small bilateral pulmonary emboli. 2. Much improved CT appearance of the chest overall. The mediastinal and hilar adenopathy has resolved and the metastatic rib, sternal and left clavicle lesions appear improved. 3. Persistent significant metastatic disease involving the thoracic spine but there is also evidence of some improvement and no progressive findings. New pathologic compression fractures of T8 and T9. No spinal canal compromise. 4. Resolution of hepatic metastatic disease. 5. Persistent lumbar spine and pelvic metastatic bone lesions but some of the show slight improvement with less significant soft tissue components and some bony healing changes. 6. Status post cysto prostatectomy with neobladder. No complicating features. No abdominal or pelvic lymphadenopathy. Electronically Signed   By: Marijo Sanes M.D.   On: 10/11/2018 19:44    EKG:  Independently reviewed.  Sinus tachycardia.  Assessment/Plan Principal Problem:   Pulmonary embolism (HCC) Active Problems:   Malignant neoplasm of prostate ([place])   Urothelial cancer ([place])   Status post radical cystoprostatectomy   Spine metastasis ([place])    1. Bilateral pulmonary embolism with no cor pulmonale hemodynamically stable started on heparin.  Will check troponin.  Check Dopplers of the lower extremity.  If stable will transition to Lovenox. 2. Nausea vomiting cause not clear could be related to chemotherapy or UTI.  At the time of my exam patient is able to tolerate clear liquids will advance as tolerated. 3. T8 and T9 pathological compression fracture with no spinal canal compromise.  Likely also contributing to patient's chest pain.  On pain relief medications. 4. Hypercalcemia likely from patient metastatic cancer and also could be from dehydration.  Hydrate and follow metabolic panel. 5. Possible UTI on ceftriaxone.  Follow cultures.  Has had E. coli bacteremia last month. 6. Pancytopenia likely from chemotherapy follow CBC. 7. Metastatic bladder and prostate cancer being followed by oncologist Dr. [name].  On palliative chemotherapy.  COVID-19 test is pending.  Note that patient is on Decadron.  If patient has persistent vomiting again and unable to take may have to give IV steroids as stress dose.   DVT prophylaxis: Heparin. Code Status: Full code. Family Communication: Discussed with patient. Disposition Plan: Home. Consults called: None. Admission status: Observation.   Rise Patience MD [facility] Pager [phone].  If 7PM-7AM, please contact night-coverage [email or web address] Password [facility]  10/11/2018, 10:56 PM

## 2018-10-11 NOTE — ED Notes (Signed)
Offered to assist patient out of wheelchair into position of comfort in bed. Patient declined. Continues to sit in wheelchair.

## 2018-10-11 NOTE — ED Notes (Signed)
Patient reports allergy with itching reaction to morphine. MD made aware.

## 2018-10-12 ENCOUNTER — Inpatient Hospital Stay (HOSPITAL_COMMUNITY): Payer: PRIVATE HEALTH INSURANCE

## 2018-10-12 DIAGNOSIS — I071 Rheumatic tricuspid insufficiency: Secondary | ICD-10-CM | POA: Diagnosis present

## 2018-10-12 DIAGNOSIS — F419 Anxiety disorder, unspecified: Secondary | ICD-10-CM | POA: Diagnosis present

## 2018-10-12 DIAGNOSIS — C61 Malignant neoplasm of prostate: Secondary | ICD-10-CM | POA: Diagnosis present

## 2018-10-12 DIAGNOSIS — I248 Other forms of acute ischemic heart disease: Secondary | ICD-10-CM | POA: Diagnosis present

## 2018-10-12 DIAGNOSIS — D61810 Antineoplastic chemotherapy induced pancytopenia: Secondary | ICD-10-CM | POA: Diagnosis present

## 2018-10-12 DIAGNOSIS — T451X5A Adverse effect of antineoplastic and immunosuppressive drugs, initial encounter: Secondary | ICD-10-CM | POA: Diagnosis present

## 2018-10-12 DIAGNOSIS — I824Y2 Acute embolism and thrombosis of unspecified deep veins of left proximal lower extremity: Secondary | ICD-10-CM | POA: Diagnosis present

## 2018-10-12 DIAGNOSIS — M8448XA Pathological fracture, other site, initial encounter for fracture: Secondary | ICD-10-CM | POA: Diagnosis present

## 2018-10-12 DIAGNOSIS — C7951 Secondary malignant neoplasm of bone: Secondary | ICD-10-CM | POA: Diagnosis present

## 2018-10-12 DIAGNOSIS — Z7952 Long term (current) use of systemic steroids: Secondary | ICD-10-CM | POA: Diagnosis not present

## 2018-10-12 DIAGNOSIS — Z20828 Contact with and (suspected) exposure to other viral communicable diseases: Secondary | ICD-10-CM | POA: Diagnosis present

## 2018-10-12 DIAGNOSIS — N390 Urinary tract infection, site not specified: Secondary | ICD-10-CM | POA: Diagnosis present

## 2018-10-12 DIAGNOSIS — I2699 Other pulmonary embolism without acute cor pulmonale: Secondary | ICD-10-CM | POA: Diagnosis present

## 2018-10-12 DIAGNOSIS — C689 Malignant neoplasm of urinary organ, unspecified: Secondary | ICD-10-CM | POA: Diagnosis present

## 2018-10-12 DIAGNOSIS — Z9079 Acquired absence of other genital organ(s): Secondary | ICD-10-CM | POA: Diagnosis not present

## 2018-10-12 DIAGNOSIS — F329 Major depressive disorder, single episode, unspecified: Secondary | ICD-10-CM | POA: Diagnosis present

## 2018-10-12 DIAGNOSIS — Z885 Allergy status to narcotic agent status: Secondary | ICD-10-CM | POA: Diagnosis not present

## 2018-10-12 DIAGNOSIS — Z79899 Other long term (current) drug therapy: Secondary | ICD-10-CM | POA: Diagnosis not present

## 2018-10-12 DIAGNOSIS — R112 Nausea with vomiting, unspecified: Secondary | ICD-10-CM | POA: Diagnosis present

## 2018-10-12 DIAGNOSIS — I2602 Saddle embolus of pulmonary artery with acute cor pulmonale: Secondary | ICD-10-CM

## 2018-10-12 DIAGNOSIS — Z87891 Personal history of nicotine dependence: Secondary | ICD-10-CM | POA: Diagnosis not present

## 2018-10-12 DIAGNOSIS — Z8619 Personal history of other infectious and parasitic diseases: Secondary | ICD-10-CM | POA: Diagnosis not present

## 2018-10-12 DIAGNOSIS — I82412 Acute embolism and thrombosis of left femoral vein: Secondary | ICD-10-CM | POA: Diagnosis present

## 2018-10-12 LAB — CBC
HCT: 21.3 % — ABNORMAL LOW (ref 39.0–52.0)
Hemoglobin: 6.9 g/dL — CL (ref 13.0–17.0)
MCH: 31.8 pg (ref 26.0–34.0)
MCHC: 32.4 g/dL (ref 30.0–36.0)
MCV: 98.2 fL (ref 80.0–100.0)
Platelets: 129 10*3/uL — ABNORMAL LOW (ref 150–400)
RBC: 2.17 MIL/uL — ABNORMAL LOW (ref 4.22–5.81)
RDW: 17.3 % — ABNORMAL HIGH (ref 11.5–15.5)
WBC: 1.3 10*3/uL — CL (ref 4.0–10.5)
nRBC: 15.5 % — ABNORMAL HIGH (ref 0.0–0.2)

## 2018-10-12 LAB — BASIC METABOLIC PANEL
Anion gap: 11 (ref 5–15)
BUN: 24 mg/dL — ABNORMAL HIGH (ref 6–20)
CO2: 21 mmol/L — ABNORMAL LOW (ref 22–32)
Calcium: 9.4 mg/dL (ref 8.9–10.3)
Chloride: 109 mmol/L (ref 98–111)
Creatinine, Ser: 0.76 mg/dL (ref 0.61–1.24)
GFR calc Af Amer: >60 mL/min (ref >60)
GFR calc non Af Amer: >60 mL/min (ref >60)
Glucose, Bld: 94 mg/dL (ref 70–99)
Potassium: 3.9 mmol/L (ref 3.5–5.1)
Sodium: 141 mmol/L (ref 135–145)

## 2018-10-12 LAB — HEPARIN LEVEL (UNFRACTIONATED)
Heparin Unfractionated: 0.78 IU/mL — ABNORMAL HIGH (ref 0.30–0.70)
Heparin Unfractionated: 0.47 IU/mL (ref 0.30–0.70)
Heparin Unfractionated: 0.47 IU/mL (ref 0.30–0.70)

## 2018-10-12 LAB — SARS CORONAVIRUS 2 (TAT 6-24 HRS): SARS Coronavirus 2: NEGATIVE

## 2018-10-12 LAB — TROPONIN I (HIGH SENSITIVITY)
Troponin I (High Sensitivity): 34 ng/L — ABNORMAL HIGH (ref <18)
Troponin I (High Sensitivity): 31 ng/L — ABNORMAL HIGH (ref <18)

## 2018-10-12 LAB — ECHOCARDIOGRAM COMPLETE
Height: 74 in
Weight: 2240 oz

## 2018-10-12 LAB — PREPARE RBC (CROSSMATCH)

## 2018-10-12 MED ORDER — SODIUM CHLORIDE 0.9 % IV SOLN
1.0000 g | INTRAVENOUS | Status: DC
  Administered 2018-10-12 – 2018-10-13 (×2): 1 g via INTRAVENOUS
  Filled 2018-10-12 (×2): qty 1

## 2018-10-12 MED ORDER — SODIUM CHLORIDE 0.9 % IV SOLN: INTRAVENOUS | Status: DC

## 2018-10-12 MED ORDER — SODIUM CHLORIDE 0.9% IV SOLUTION: Freq: Once | INTRAVENOUS | Status: DC

## 2018-10-12 NOTE — Progress Notes (Signed)
[place] for Heparin Indication: pulmonary embolus  Allergies  Allergen Reactions  . Morphine And Related Itching  . Vicodin [Hydrocodone-Acetaminophen] Itching    Patient Measurements: Height: 6\' 2"  (188 cm) Weight: 140 lb (63.5 kg) IBW/kg (Calculated) : 82.2 Heparin Dosing Weight: actual body weight   Vital Signs: Temp: 98.2 F (36.8 C) (09/26 1346) Temp Source: Oral (09/26 1346) BP: 109/70 (09/26 1346) Pulse Rate: 86 (09/26 1346)  Labs: Recent Labs    10/11/18 1610 10/11/18 1803 10/11/18 2208 10/11/18 2325 10/12/18 0533 10/12/18 1342  HGB 8.8*  --   --   --  6.9*  --   HCT 25.9*  --   --   --  21.3*  --   PLT 125*  --   --   --  129*  --   APTT  --   --  30  --   --   --   LABPROT  --   --  14.3  --   --   --   INR  --   --  1.1  --   --   --   HEPARINUNFRC  --   --   --   --  0.Rodney* 0.47  CREATININE 0.64  --   --   --  0.76  --   TROPONINIHS 27* 31*  --  34* 31*  --     Estimated Creatinine Clearance: 99.2 mL/min (by C-G formula based on SCr of 0.76 mg/dL).   Medical History: Past Medical History:  Diagnosis Date  . Anxiety   . Bladder tumor   . Blood in urine    saw some 3 days ago but none inthe last 2 days   . Bradycardia   . Depression   . Dizziness    historical   . Elevated PSA   . Family history of gastric cancer   . Heartburn    occasional   . Pain with urination   . Prostate cancer ([place])   . [facility]rothelial cancer [facility])     Assessment: Rodney [name] with PMH of metastatic bladder and prostate cancer found to have bilateral PE on CTa chest. Pharmacy consulted for IV heparin dosing. Baseline coags WNL. Patient not on any anticoagulants PTA.   Today, 10/12/18:  Heparin level = 0.47 units/mL, now therapeutic   Pancytopenic in setting of chemotherapy. Transfusing 1 unit PRBCs for Hgb 6.9. Pltc low at 129K.  No bleeding or infusion issues noted per nursing.   Goal of Therapy:  Heparin level 0.3-0.7  units/ml Monitor platelets by anticoagulation protocol: Yes   Plan:  Continue heparin infusion at 1250 units/hr Heparin level in 6 hours to confirm remains within therapeutic range Daily CBC, heparin level Monitor closely for s/sx of bleeding    Lindell Spar, PharmD, BCPS Clinical Pharmacist   10/12/2018,2:37 PM

## 2018-10-12 NOTE — Progress Notes (Signed)
PROGRESS NOTE    Rodney [name]  [id number] DOB: 1970/06/27 DOA: 10/11/2018 PCP: Curlene Labrum, MD     Brief Narrative:  Rodney [name] is a 48 y.o. 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 coli bacteremia presents to the ER with complaints of 2 days of increasing nausea vomiting with chest pain.  Patient states that chest pain is mostly in the right side, constant, pleuritic in nature.  Pain also at times radiates to the back towards the spine.  Denies any associated shortness of breath, productive cough, fever, chills.  Patient states he has been having persistent nausea, vomiting unable to keep in last 2 days.  Denies any diarrhea.  No abdominal pain.  New events last 24 hours / Subjective: No further vomiting, tolerated liquid diet yesterday.  Continues to have right-sided chest pain as well as pain that is circumferential around his pathologic vertebral fracture.  Denies any shortness of breath today.  Asking if we can make a visitation exception so more than 1 family member can be designated to visit.  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 coli bacteremia -Urinalysis showed large leukocytes, few bacteria, > 50 WBC -Urine culture pending -Empiric Rocephin  Pancytopenia -In setting of  chemotherapy -Transfuse 1 unit packed red blood cells today  Metastatic bladder and prostate cancer -Followed by Dr. [name], on palliative chemotherapy -Next appointment scheduled 9/30   DVT prophylaxis: IV heparin Code Status: Full code Family Communication: None Disposition Plan: Pending further work-up, improvement.  Will move patient to inpatient as patient still has uncontrolled pain due to pathologic compression fracture, need work-up and stabilization of PE.    Consultants:   None  Procedures:   None  Antimicrobials:  Anti-infectives (From admission, onward)   Start     Dose/Rate Route Frequency Ordered Stop   10/12/18 0800  cefTRIAXone (ROCEPHIN) 1 g in sodium chloride 0.9 % 100 mL IVPB     1 g 200 mL/hr over 30 Minutes Intravenous Every 24 hours 10/12/18 0509     10/11/18 1730  cefTRIAXone (ROCEPHIN) 1 g in sodium chloride 0.9 % 100 mL IVPB     1 g 200 mL/hr over 30 Minutes Intravenous  Once 10/11/18 1721 10/11/18 2026        Objective: Vitals:   10/11/18 2305 10/12/18 0550 10/12/18 0855 10/12/18 0920  BP: (!) 114/99 101/71 104/71 106/76  Pulse: 94 87 87 92  Resp: 17 18 16 16   Temp: 97.6 F (36.4 C) 98.9 F (37.2 C) 98 F (36.7 C) 98.7 F (37.1 C)  TempSrc: Oral Oral Oral Oral  SpO2: 100% 96% 95% 96%  Weight:      Height:        Intake/Output Summary (Last 24 hours) at 10/12/2018 1114 Last data filed at 10/12/2018 0500 Gross per 24 hour  Intake 193.65 ml  Output 1050 ml  Net -856.35  ml   Filed Weights   10/11/18 2100 10/11/18 2227  Weight: 65.8 kg 63.5 kg    Examination:  General exam: Appears calm and comfortable  Respiratory system: Clear to auscultation. Respiratory effort normal. No respiratory distress. No conversational dyspnea.  Cardiovascular system: S1 & S2 heard, RRR. No murmurs. No pedal edema. Gastrointestinal system: Abdomen is nondistended, soft and nontender. Normal bowel sounds heard. Central nervous system: Alert and  oriented. No focal neurological deficits. Speech clear.  Extremities: Symmetric in appearance  Skin: No rashes, lesions or ulcers on exposed skin  Psychiatry: Judgement and insight appear normal. Mood & affect appropriate.   Data Reviewed: I have personally reviewed following labs and imaging studies  CBC: Recent Labs  Lab 10/11/18 1610 10/12/18 0533  WBC 2.6* 1.3*  NEUTROABS 1.5*  --   HGB 8.8* 6.9*  HCT 25.9* 21.3*  MCV 95.6 98.2  PLT 125* [id number]*   Basic Metabolic Panel: Recent Labs  Lab 10/11/18 1610 10/12/18 0533  NA 136 141  K 4.2 3.9  CL 105 109  CO2 16* 21*  GLUCOSE 130* 94  BUN 24* 24*  CREATININE 0.64 0.76  CALCIUM 10.5* 9.4  MG 2.0  --    GFR: Estimated Creatinine Clearance: 99.2 mL/min (by C-G formula based on SCr of 0.76 mg/dL). Liver Function Tests: Recent Labs  Lab 10/11/18 1610  AST 34  ALT 40  ALKPHOS 216*  BILITOT 0.5  PROT 7.1  ALBUMIN 3.9   Recent Labs  Lab 10/11/18 1610  LIPASE 25   No results for input(s): AMMONIA in the last 168 hours. Coagulation Profile: Recent Labs  Lab 10/11/18 2208  INR 1.1   Cardiac Enzymes: No results for input(s): CKTOTAL, CKMB, CKMBINDEX, TROPONINI in the last 168 hours. BNP (last 3 results) No results for input(s): PROBNP in the last 8760 hours. HbA1C: No results for input(s): HGBA1C in the last 72 hours. CBG: No results for input(s): GLUCAP in the last 168 hours. Lipid Profile: No results for input(s): CHOL, HDL, LDLCALC, TRIG, CHOLHDL, LDLDIRECT in the last 72 hours. Thyroid Function Tests: No results for input(s): TSH, T4TOTAL, FREET4, T3FREE, THYROIDAB in the last 72 hours. Anemia Panel: No results for input(s): VITAMINB12, FOLATE, FERRITIN, TIBC, IRON, RETICCTPCT in the last 72 hours. Sepsis Labs: No results for input(s): PROCALCITON, LATICACIDVEN in the last 168 hours.  Recent Results (from the past 240 hour(s))  SARS CORONAVIRUS 2 (TAT 6-24 HRS) Nasopharyngeal Nasopharyngeal Swab      Status: None   Collection Time: 10/11/18 10:08 PM   Specimen: Nasopharyngeal Swab  Result Value Ref Range Status   SARS Coronavirus 2 NEGATIVE NEGATIVE Final    Comment: (NOTE) SARS-CoV-2 target nucleic acids are NOT DETECTED. The SARS-CoV-2 RNA is generally detectable in upper and lower respiratory specimens during the acute phase of infection. Negative results do not preclude SARS-CoV-2 infection, do not rule out co-infections with other pathogens, and should not be used as the sole basis for treatment or other patient management decisions. Negative results must be combined with clinical observations, patient history, and epidemiological information. The expected result is Negative. Fact Sheet for Patients: SugarRoll.be Fact Sheet for Healthcare Providers: https://www.woods-mathews.com/ This test is not yet approved or cleared by the [place] FDA and  has been authorized for detection and/or diagnosis of SARS-CoV-2 by FDA under an Emergency Use Authorization (EUA). This EUA will remain  in effect (meaning this test can be used) for the duration of the COVID-19 declaration under Section 56 4(b)(1) of  the Act, 21 U.S.C. section 360bbb-3(b)(1), unless the authorization is terminated or revoked sooner. Performed at [facility], [place] [address] [place], [place] [address]       Radiology Studies: Dg Chest 2 View  Result Date: 10/11/2018 CLINICAL DATA:  Pt reported vomiting and right side pains since Wednesday. Pt also reported some SOB. Medical hx of bladder cancer. Former smoker. EXAM: CHEST - 2 VIEW COMPARISON:  Chest radiograph 09/06/2018 FINDINGS: Stable cardiomediastinal contours. Right chest Port-A-Cath tip terminates at the cavoatrial junction. The lungs are clear. No pneumothorax or pleural effusion. Numerous osseous lesions consistent with known metastasis are better appreciated on prior CT. Vertebral wedge compression  deformities appears similar to prior. IMPRESSION: No acute cardiopulmonary process. Osseous metastatic disease better characterized on prior CT. Electronically Signed   By: Audie Pinto M.D.   On: 10/11/2018 16:58   Ct Angio Chest Pe W And/or Wo Contrast  Result Date: 10/11/2018 CLINICAL DATA:  Severe chest pain. History of metastatic prostate cancer. EXAM: CT ANGIOGRAPHY CHEST CT ABDOMEN AND PELVIS WITH CONTRAST TECHNIQUE: Multidetector CT imaging of the chest was performed using the standard protocol during bolus administration of intravenous contrast. Multiplanar CT image reconstructions and MIPs were obtained to evaluate the vascular anatomy. Multidetector CT imaging of the abdomen and pelvis was performed using the standard protocol during bolus administration of intravenous contrast. CONTRAST:  163mL OMNIPAQUE IOHEXOL 350 MG/ML SOLN COMPARISON:  Chest CT 08/02/2018 and CT abdomen 07/20/2018 FINDINGS: CTA CHEST FINDINGS Cardiovascular: The heart is normal in size. No pericardial effusion. The aorta is normal in caliber. No dissection or atherosclerotic calcifications. Few scattered coronary artery calcifications are noted. Right-sided Port-A-Cath in good position with its tip near the cavoatrial junction. The pulmonary arterial tree is well opacified. There are small bilateral pulmonary emboli in the lower lobes. No large central pulmonary emboli. Mediastinum/Nodes: No mediastinal or hilar mass or lymphadenopathy. The mediastinal and hilar adenopathy seen on the prior study has resolved. Mildly dilated esophagus is noted. Lungs/Pleura: Patchy areas of subsegmental atelectasis and scarring changes. No worrisome pulmonary lesions to suggest pulmonary metastatic disease. Musculoskeletal: Numerous bilateral metastatic rib lesions but these are much improved when compared to the prior examination. The large soft tissue mass is associated with metastasis have resolved and the lesions appear healed or  healing. I do not see any new destructive bony changes. The left medial clavicle region also appears improved. The proximal sternal lesion also demonstrates some healing changes. Extensive metastatic disease involving the thoracic spine also with some interval improvement/healing. No new lesions. Pathologic compression fractures are noted in the midthoracic spine. These are new since the prior study. This mainly involves the T8 and T9 vertebral bodies. There is significant disease in these vertebral bodies on the prior study. 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 Review of the MIP images confirms the above findings. CT ABDOMEN and PELVIS FINDINGS Hepatobiliary: No focal hepatic lesions to suggest recurrent metastatic disease. The numerous small hepatic lesions have resolved. The gallbladder is normal. No common bile duct dilatation. Pancreas: No mass, inflammation or ductal dilatation. Spleen: Normal size.  No focal lesions. Adrenals/Urinary Tract: The adrenal glands and kidneys are unremarkable and stable. Small bilateral renal cysts. The neobladder is moderately distended. Stomach/Bowel: The stomach, duodenum, small bowel and colon are grossly normal. No acute inflammatory changes, mass lesions or obstructive findings. Vascular/Lymphatic: The aorta and branch vessels are normal. The major venous structures are patent. No mesenteric or retroperitoneal mass or adenopathy. Reproductive: Status post  prostatectomy and cystectomy with neobladder. Other: No free pelvic fluid collections or pelvic abscess. No inguinal adenopathy. Musculoskeletal: Improved appearance of the large pelvic metastatic lesions. The soft tissue masses are smaller and the bone lesions demonstrate some healing changes. Review of the MIP images confirms the above findings. IMPRESSION: 1. Small bilateral pulmonary emboli. 2. Much improved CT appearance of the chest overall. The mediastinal and hilar adenopathy has resolved and the  metastatic rib, sternal and left clavicle lesions appear improved. 3. Persistent significant metastatic disease involving the thoracic spine but there is also evidence of some improvement and no progressive findings. New pathologic compression fractures of T8 and T9. No spinal canal compromise. 4. Resolution of hepatic metastatic disease. 5. Persistent lumbar spine and pelvic metastatic bone lesions but some of the show slight improvement with less significant soft tissue components and some bony healing changes. 6. Status post cysto prostatectomy with neobladder. No complicating features. No abdominal or pelvic lymphadenopathy. Electronically Signed   By: Marijo Sanes M.D.   On: 10/11/2018 19:44   Ct Abdomen Pelvis W Contrast  Result Date: 10/11/2018 CLINICAL DATA:  Severe chest pain. History of metastatic prostate cancer. EXAM: CT ANGIOGRAPHY CHEST CT ABDOMEN AND PELVIS WITH CONTRAST TECHNIQUE: Multidetector CT imaging of the chest was performed using the standard protocol during bolus administration of intravenous contrast. Multiplanar CT image reconstructions and MIPs were obtained to evaluate the vascular anatomy. Multidetector CT imaging of the abdomen and pelvis was performed using the standard protocol during bolus administration of intravenous contrast. CONTRAST:  129mL OMNIPAQUE IOHEXOL 350 MG/ML SOLN COMPARISON:  Chest CT 08/02/2018 and CT abdomen 07/20/2018 FINDINGS: CTA CHEST FINDINGS Cardiovascular: The heart is normal in size. No pericardial effusion. The aorta is normal in caliber. No dissection or atherosclerotic calcifications. Few scattered coronary artery calcifications are noted. Right-sided Port-A-Cath in good position with its tip near the cavoatrial junction. The pulmonary arterial tree is well opacified. There are small bilateral pulmonary emboli in the lower lobes. No large central pulmonary emboli. Mediastinum/Nodes: No mediastinal or hilar mass or lymphadenopathy. The mediastinal and  hilar adenopathy seen on the prior study has resolved. Mildly dilated esophagus is noted. Lungs/Pleura: Patchy areas of subsegmental atelectasis and scarring changes. No worrisome pulmonary lesions to suggest pulmonary metastatic disease. Musculoskeletal: Numerous bilateral metastatic rib lesions but these are much improved when compared to the prior examination. The large soft tissue mass is associated with metastasis have resolved and the lesions appear healed or healing. I do not see any new destructive bony changes. The left medial clavicle region also appears improved. The proximal sternal lesion also demonstrates some healing changes. Extensive metastatic disease involving the thoracic spine also with some interval improvement/healing. No new lesions. Pathologic compression fractures are noted in the midthoracic spine. These are new since the prior study. This mainly involves the T8 and T9 vertebral bodies. There is significant disease in these vertebral bodies on the prior study. 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 Review of the MIP images confirms the above findings. CT ABDOMEN and PELVIS FINDINGS Hepatobiliary: No focal hepatic lesions to suggest recurrent metastatic disease. The numerous small hepatic lesions have resolved. The gallbladder is normal. No common bile duct dilatation. Pancreas: No mass, inflammation or ductal dilatation. Spleen: Normal size.  No focal lesions. Adrenals/Urinary Tract: The adrenal glands and kidneys are unremarkable and stable. Small bilateral renal cysts. The neobladder is moderately distended. Stomach/Bowel: The stomach, duodenum, small bowel and colon are grossly normal. No acute inflammatory changes, mass lesions  or obstructive findings. Vascular/Lymphatic: The aorta and branch vessels are normal. The major venous structures are patent. No mesenteric or retroperitoneal mass or adenopathy. Reproductive: Status post prostatectomy and cystectomy with neobladder.  Other: No free pelvic fluid collections or pelvic abscess. No inguinal adenopathy. Musculoskeletal: Improved appearance of the large pelvic metastatic lesions. The soft tissue masses are smaller and the bone lesions demonstrate some healing changes. Review of the MIP images confirms the above findings. IMPRESSION: 1. Small bilateral pulmonary emboli. 2. Much improved CT appearance of the chest overall. The mediastinal and hilar adenopathy has resolved and the metastatic rib, sternal and left clavicle lesions appear improved. 3. Persistent significant metastatic disease involving the thoracic spine but there is also evidence of some improvement and no progressive findings. New pathologic compression fractures of T8 and T9. No spinal canal compromise. 4. Resolution of hepatic metastatic disease. 5. Persistent lumbar spine and pelvic metastatic bone lesions but some of the show slight improvement with less significant soft tissue components and some bony healing changes. 6. Status post cysto prostatectomy with neobladder. No complicating features. No abdominal or pelvic lymphadenopathy. Electronically Signed   By: Marijo Sanes M.D.   On: 10/11/2018 19:44      Scheduled Meds:  sodium chloride   Intravenous Once   Chlorhexidine Gluconate Cloth  6 each Topical Daily   dexamethasone  2 mg Oral Daily   dronabinol  2.5 mg Oral BID AC   DULoxetine  30 mg Oral Daily   fentaNYL  1 patch Transdermal Q72H   pantoprazole  40 mg Oral Daily   senna-docusate  2 tablet Oral BID   Continuous Infusions:  sodium chloride     cefTRIAXone (ROCEPHIN)  IV 1 g (10/12/18 0815)   heparin 1,250 Units/hr (10/12/18 0647)     LOS: 0 days      Time spent: 35 minutes   Dessa Phi, DO [facility] 10/12/2018, 11:14 AM   Available via Epic secure chat 7am-7pm After these hours, please refer to coverage provider listed on [email or web address]

## 2018-10-12 NOTE — Progress Notes (Signed)
Bilateral lower extremity venous duplex completed. Preliminary results in Chart review CV Proc. [place] Bunny Kleist,RVS 10/12/2018, 4:42 PM

## 2018-10-12 NOTE — Progress Notes (Signed)
CRITICAL VALUE ALERT  Critical Value:  WBC 1.3 & Hgb 6.9  Date & Time Notied:  9/26 @ 0628  Provider Notified: Kennon Holter  Orders Received/Actions taken: Pending

## 2018-10-12 NOTE — Progress Notes (Signed)
*  PRELIMINARY RESULTS* Echocardiogram 2D Echocardiogram has been performed.  Rodney [name] 10/12/2018, 1:04 PM

## 2018-10-12 NOTE — Progress Notes (Signed)
ANTICOAGULATION CONSULT NOTE - Follow Up Consult  Pharmacy Consult for Heparin Indication: pulmonary embolus  Allergies  Allergen Reactions  . Morphine And Related Itching  . Vicodin [Hydrocodone-Acetaminophen] Itching    Patient Measurements: Height: 6\' 2"  (188 cm) Weight: 140 lb (63.5 kg) IBW/kg (Calculated) : 82.2 Heparin Dosing Weight:   Vital Signs: Temp: 98.9 F (37.2 C) (09/26 0550) Temp Source: Oral (09/26 0550) BP: 101/71 (09/26 0550) Pulse Rate: 87 (09/26 0550)  Labs: Recent Labs    10/11/18 1610 10/11/18 1803 10/11/18 2208 10/11/18 2325 10/12/18 0533  HGB 8.8*  --   --   --  6.9*  HCT 25.9*  --   --   --  21.3*  PLT 125*  --   --   --  129*  APTT  --   --  30  --   --   LABPROT  --   --  14.3  --   --   INR  --   --  1.1  --   --   HEPARINUNFRC  --   --   --   --  0.78*  CREATININE 0.64  --   --   --  0.76  TROPONINIHS 27* 31*  --  34* 31*    Estimated Creatinine Clearance: 99.2 mL/min (by C-G formula based on SCr of 0.76 mg/dL).   Medications:  Infusions:  . sodium chloride    . cefTRIAXone (ROCEPHIN)  IV    . heparin 1,250 Units/hr (10/12/18 [id number])    Assessment: Patient with high heparin level.  No heparin issues per RN.  Goal of Therapy:  Heparin level 0.3-0.7 units/ml Monitor platelets by anticoagulation protocol: Yes   Plan:  Decrease heparin to 1250 units/hr Recheck level at [address], [place] 10/12/2018,6:47 AM

## 2018-10-13 LAB — BPAM RBC
Blood Product Expiration Date: 202010262359
ISSUE DATE / TIME: 202009260853
Unit Type and Rh: 5100

## 2018-10-13 LAB — CBC
HCT: 24.5 % — ABNORMAL LOW (ref 39.0–52.0)
Hemoglobin: 7.8 g/dL — ABNORMAL LOW (ref 13.0–17.0)
MCH: 31.2 pg (ref 26.0–34.0)
MCHC: 31.8 g/dL (ref 30.0–36.0)
MCV: 98 fL (ref 80.0–100.0)
Platelets: 179 10*3/uL (ref 150–400)
RBC: 2.5 MIL/uL — ABNORMAL LOW (ref 4.22–5.81)
RDW: 17.5 % — ABNORMAL HIGH (ref 11.5–15.5)
WBC: 1.2 10*3/uL — CL (ref 4.0–10.5)
nRBC: 9.1 % — ABNORMAL HIGH (ref 0.0–0.2)

## 2018-10-13 LAB — TYPE AND SCREEN
ABO/RH(D): O POS
Antibody Screen: NEGATIVE
Unit division: 0

## 2018-10-13 LAB — HEPARIN LEVEL (UNFRACTIONATED): Heparin Unfractionated: 0.53 IU/mL (ref 0.30–0.70)

## 2018-10-13 MED ORDER — ENOXAPARIN SODIUM 60 MG/0.6ML ~~LOC~~ SOLN: 60.0000 mg | Freq: Two times a day (BID) | SUBCUTANEOUS | Status: DC

## 2018-10-13 MED ORDER — ENOXAPARIN SODIUM 60 MG/0.6ML ~~LOC~~ SOLN
60.0000 mg | Freq: Once | SUBCUTANEOUS | Status: AC
  Administered 2018-10-13: 12:00:00 60 mg via SUBCUTANEOUS
  Filled 2018-10-13: qty 0.6

## 2018-10-13 MED ORDER — HEPARIN SOD (PORK) LOCK FLUSH 100 UNIT/ML IV SOLN
500.0000 [IU] | Freq: Once | INTRAVENOUS | Status: DC
  Filled 2018-10-13 (×2): qty 5

## 2018-10-13 MED ORDER — ENOXAPARIN SODIUM 60 MG/0.6ML ~~LOC~~ SOLN: 60.0000 mg | Freq: Two times a day (BID) | SUBCUTANEOUS | 2 refills | Status: DC

## 2018-10-13 MED ORDER — HEPARIN SOD (PORK) LOCK FLUSH 100 UNIT/ML IV SOLN
500.0000 [IU] | INTRAVENOUS | Status: AC | PRN
  Administered 2018-10-13: 12:00:00 500 [IU]
  Filled 2018-10-13: qty 5

## 2018-10-13 MED ORDER — ENOXAPARIN (LOVENOX) PATIENT EDUCATION KIT
PACK | Freq: Once | Status: AC
  Administered 2018-10-13: 12:00:00
  Filled 2018-10-13: qty 1

## 2018-10-13 NOTE — Discharge Instructions (Signed)
Pulmonary Embolism  A pulmonary embolism (PE) is a sudden blockage or decrease of blood flow in one or both lungs. Most blockages come from a blood clot that forms in the vein of a lower leg, thigh, or arm (deep vein thrombosis, DVT) and travels to the lungs. A clot is blood that has thickened into a gel or solid. PE is a dangerous and life-threatening condition that needs to be treated right away. What are the causes? This condition is usually caused by a blood clot that forms in a vein and moves to the lungs. In rare cases, it may be caused by air, fat, part of a tumor, or other tissue that moves through the veins and into the lungs. What increases the risk? The following factors may make you more likely to develop this condition:  Experiencing a traumatic injury, such as breaking a hip or leg.  Having: ? A spinal cord injury. ? Orthopedic surgery, especially hip or knee replacement. ? Any major surgery. ? A stroke. ? DVT. ? Blood clots or blood clotting disease. ? Long-term (chronic) lung or heart disease. ? Cancer treated with chemotherapy. ? A central venous catheter.  Taking medicines that contain estrogen. These include birth control pills and hormone replacement therapy.  Being: ? Pregnant. ? In the period of time after your baby is delivered (postpartum). ? Older than age 16. ? Overweight. ? A smoker, especially if you have other risks. What are the signs or symptoms? Symptoms of this condition usually start suddenly and include:  Shortness of breath during activity or at rest.  Coughing, coughing up blood, or coughing up blood-tinged mucus.  Chest pain that is often worse with deep breaths.  Rapid or irregular heartbeat.  Feeling light-headed or dizzy.  Fainting.  Feeling anxious.  Fever.  Sweating.  Pain and swelling in a leg. This is a symptom of DVT, which can lead to PE. How is this diagnosed? This condition may be diagnosed based on:  Your medical  history.  A physical exam.  Blood tests.  CT pulmonary angiogram. This test checks blood flow in and around your lungs.  Ventilation-perfusion scan, also called a lung VQ scan. This test measures air flow and blood flow to the lungs.  An ultrasound of the legs. How is this treated? Treatment for this condition depends on many factors, such as the cause of your PE, your risk for bleeding or developing more clots, and other medical conditions you have. Treatment aims to remove, dissolve, or stop blood clots from forming or growing larger. Treatment may include:  Medicines, such as: ? Blood thinning medicines (anticoagulants) to stop clots from forming. ? Medicines that dissolve clots (thrombolytics).  Procedures, such as: ? Using a flexible tube to remove a blood clot (embolectomy) or to deliver medicine to destroy it (catheter-directed thrombolysis). ? Inserting a filter into a large vein that carries blood to the heart (inferior vena cava). This filter (vena cava filter) catches blood clots before they reach the lungs. ? Surgery to remove the clot (surgical embolectomy). This is rare. You may need a combination of immediate, long-term (up to 3 months after diagnosis), and extended (more than 3 months after diagnosis) treatments. Your treatment may continue for several months (maintenance therapy). You and your health care provider will work together to choose the treatment program that is best for you. Follow these instructions at home: Medicines  Take over-the-counter and prescription medicines only as told by your health care provider.  If you  are taking an anticoagulant medicine: ? Take the medicine every day at the same time each day. ? Understand what foods and drugs interact with your medicine. ? Understand the side effects of this medicine, including excessive bruising or bleeding. Ask your health care provider or pharmacist about other side effects. General  instructions  Wear a medical alert bracelet or carry a medical alert card that says you have had a PE and lists what medicines you take.  Ask your health care provider when you may return to your normal activities. Avoid sitting or lying for a long time without moving.  Maintain a healthy weight. Ask your health care provider what weight is healthy for you.  Do not use any products that contain nicotine or tobacco, such as cigarettes, e-cigarettes, and chewing tobacco. If you need help quitting, ask your health care provider.  Talk with your health care provider about any travel plans. It is important to make sure that you are still able to take your medicine while on trips.  Keep all follow-up visits as told by your health care provider. This is important. Contact a health care provider if:  You missed a dose of your blood thinner medicine. Get help right away if:  You have: ? New or increased pain, swelling, warmth, or redness in an arm or leg. ? Numbness or tingling in an arm or leg. ? Shortness of breath during activity or at rest. ? A fever. ? Chest pain. ? A rapid or irregular heartbeat. ? A severe headache. ? Vision changes. ? A serious fall or accident, or you hit your head. ? Stomach (abdominal) pain. ? Blood in your vomit, stool, or urine. ? A cut that will not stop bleeding.  You cough up blood.  You feel light-headed or dizzy.  You cannot move your arms or legs.  You are confused or have memory loss. These symptoms may represent a serious problem that is an emergency. Do not wait to see if the symptoms will go away. Get medical help right away. Call your local emergency services (911 in the [place] Do not drive yourself to the hospital. Summary  A pulmonary embolism (PE) is a sudden blockage or decrease of blood flow in one or both lungs. PE is a dangerous and life-threatening condition that needs to be treated right away.  Treatments for this condition usually  include medicines to thin your blood (anticoagulants) or medicines to break apart blood clots (thrombolytics).  If you are given blood thinners, it is important to take the medicine every day at the same time each day.  Understand what foods and drugs interact with any medicines that you are taking.  If you have signs of PE or DVT, call your local emergency services (911 in the [place] This information is not intended to replace advice given to you by your health care provider. Make sure you discuss any questions you have with your health care provider. Document Released: 12/31/1999 Document Revised: 10/10/2017 Document Reviewed: 10/10/2017 Elsevier Patient Education  2020 [place].  Deep Vein Thrombosis  Deep vein thrombosis (DVT) is a condition in which a blood clot forms in a deep vein, such as a lower leg, thigh, or arm vein. A clot is blood that has thickened into a gel or solid. This condition is dangerous. It can lead to serious and even life-threatening complications if the clot travels to the lungs and causes a blockage (pulmonary embolism). It can also damage veins in the leg. This  can result in leg pain, swelling, discoloration, and sores (post-thrombotic syndrome). What are the causes? This condition may be caused by:  A slowdown of blood flow.  Damage to a vein.  A condition that causes blood to clot more easily, such as an inherited clotting disorder. What increases the risk? The following factors may make you more likely to develop this condition:  Being overweight.  Being older, especially over age 17.  Sitting or lying down for more than four hours.  Being in the hospital.  Lack of physical activity (sedentary lifestyle).  Pregnancy, being in childbirth, or having recently given birth.  Taking medicines that contain estrogen, such as medicines to prevent pregnancy.  Smoking.  A history of any of the following: ? Blood clots or a blood clotting  disease. ? Peripheral vascular disease. ? Inflammatory bowel disease. ? Cancer. ? Heart disease. ? Genetic conditions that affect how your blood clots, such as Factor V Leiden mutation. ? Neurological diseases that affect your legs (leg paresis). ? A recent injury, such as a car accident. ? Major or lengthy surgery. ? A central line placed inside a large vein. What are the signs or symptoms? Symptoms of this condition include:  Swelling, pain, or tenderness in an arm or leg.  Warmth, redness, or discoloration in an arm or leg. If the clot is in your leg, symptoms may be more noticeable or worse when you stand or walk. Some people may not develop any symptoms. How is this diagnosed? This condition is diagnosed with:  A medical history and physical exam.  Tests, such as: ? Blood tests. These are done to check how well your blood clots. ? Ultrasound. This is done to check for clots. ? Venogram. For this test, contrast dye is injected into a vein and X-rays are taken to check for any clots. How is this treated? Treatment for this condition depends on:  The cause of your DVT.  Your risk for bleeding or developing more clots.  Any other medical conditions that you have. Treatment may include:  Taking a blood thinner (anticoagulant). This type of medicine prevents clots from forming. It may be taken by mouth, injected under the skin, or injected through an IV (catheter).  Injecting clot-dissolving medicines into the affected vein (catheter-directed thrombolysis).  Having surgery. Surgery may be done to: ? Remove the clot. ? Place a filter in a large vein to catch blood clots before they reach the lungs. Some treatments may be continued for up to six months. Follow these instructions at home: If you are taking blood thinners:  Take the medicine exactly as told by your health care provider. Some blood thinners need to be taken at the same time every day. Do not skip a  dose.  Talk with your health care provider before you take any medicines that contain aspirin or NSAIDs. These medicines increase your risk for dangerous bleeding.  Ask your health care provider about foods and drugs that could change the way the medicine works (may interact). Avoid those things if your health care provider tells you to do so.  Blood thinners can cause easy bruising and may make it difficult to stop bleeding. Because of this: ? Be very careful when using knives, scissors, or other sharp objects. ? Use an electric razor instead of a blade. ? Avoid activities that could cause injury or bruising, and follow instructions about how to prevent falls.  Wear a medical alert bracelet or carry a card that lists  what medicines you take. General instructions  Take over-the-counter and prescription medicines only as told by your health care provider.  Return to your normal activities as told by your health care provider. Ask your health care provider what activities are safe for you.  Wear compression stockings if recommended by your health care provider.  Keep all follow-up visits as told by your health care provider. This is important. How is this prevented? To lower your risk of developing this condition again:  For 30 or more minutes every day, do an activity that: ? Involves moving your arms and legs. ? Increases your heart rate.  When traveling for longer than four hours: ? Exercise your arms and legs every hour. ? Drink plenty of water. ? Avoid drinking alcohol.  Avoid sitting or lying for a long time without moving your legs.  If you have surgery or you are hospitalized, ask about ways to prevent blood clots. These may include taking frequent walks or using anticoagulants.  Stay at a healthy weight.  If you are a woman who is older than age 87, avoid unnecessary use of medicines that contain estrogen, such as some birth control pills.  Do not use any products that  contain nicotine or tobacco, such as cigarettes and e-cigarettes. This is especially important if you take estrogen medicines. If you need help quitting, ask your health care provider. Contact a health care provider if:  You miss a dose of your blood thinner.  Your menstrual period is heavier than usual.  You have unusual bruising. Get help right away if:  You have: ? New or increased pain, swelling, or redness in an arm or leg. ? Numbness or tingling in an arm or leg. ? Shortness of breath. ? Chest pain. ? A rapid or irregular heartbeat. ? A severe headache or confusion. ? A cut that will not stop bleeding.  There is blood in your vomit, stool, or urine.  You have a serious fall or accident, or you hit your head.  You feel light-headed or dizzy.  You cough up blood. These symptoms may represent a serious problem that is an emergency. Do not wait to see if the symptoms will go away. Get medical help right away. Call your local emergency services (911 in the [place] Do not drive yourself to the hospital. Summary  Deep vein thrombosis (DVT) is a condition in which a blood clot forms in a deep vein, such as a lower leg, thigh, or arm vein.  Symptoms can include swelling, warmth, pain, and redness in your leg or arm.  This condition may be treated with a blood thinner (anticoagulant medicine), medicine that is injected to dissolve blood clots,compression stockings, or surgery.  If you are prescribed blood thinners, take them exactly as told. This information is not intended to replace advice given to you by your health care provider. Make sure you discuss any questions you have with your health care provider. Document Released: 01/02/2005 Document Revised: 12/15/2016 Document Reviewed: 06/02/2016 Elsevier Patient Education  2020 [employer].

## 2018-10-13 NOTE — Discharge Summary (Signed)
Physician Discharge Summary  Rodney [name] [id number] DOB: [birth year] DOA: 10/11/2018  PCP: Curlene Labrum, MD  Admit date: 10/11/2018 Discharge date: 10/13/2018  Admitted From: Home Disposition:  Home  Recommendations for Outpatient Follow-up:  1. Follow up with Dr. [name] as scheduled on 9/30  Discharge Condition: Stable CODE STATUS: Full  Diet recommendation: Regular diet   Brief/Interim Summary: Rodney [name] is a 48 y.o.malewithhistory of metastatic bladder and prostate cancer on palliative chemotherapy admitted last month for E. coli bacteremia presents to the ER with complaints of 2 days of increasing nausea vomiting with chest pain. Patient states that chest pain is mostly in the right side, constant, pleuritic in nature. Pain also at times radiates to the back towards the spine. Denies any associated shortness of breath, productive cough, fever, chills. Patient states he has been having persistent nausea, vomiting unable to keep in last 2 days. Denies any diarrhea. No abdominal pain.  Work-up revealed small bilateral pulmonary emboli, pathologic compression fracture T8, T9.  Echocardiogram revealed LV septal thickness, LVH, Doppler studies revealed bilateral DVT.  Patient was started on IV heparin, remained stable on room air during his admission.  He was discharged home with Lovenox.  Discharge Diagnoses:  Principal Problem:   Pulmonary embolism (HCC) Active Problems:   Malignant neoplasm of prostate (HCC)   Urothelial cancer ([place])   Status post radical cystoprostatectomy   Spine metastasis (HCC)   Bilateral PE and bilateral DVT  -Currently on room air without acute respiratory distress, vital signs remained stable -Echocardiogram as below -Doppler studies revealed bilateral DVT  Demand ischemia -In setting of above -Troponin 27, 31, 34, 31  Nausea, vomiting -Resolved, tolerating diet on day of discharge  T8, T9 pathologic compression fracture  without spinal canal compromise -Pain control  Asymptomatic pyuria -Urinalysis showed large leukocytes, few bacteria, > 50 WBC -Urine culture unavailable, not collected -Patient with no symptoms such as dysuria  Pancytopenia -In setting of chemotherapy -Transfused 1 unit packed red blood cells   Metastatic bladder and prostate cancer -Followed by Dr. [name], on palliative chemotherapy -Next appointment scheduled 9/30    Discharge Instructions  Discharge Instructions    Call MD for:  difficulty breathing, headache or visual disturbances   Complete by: As directed    Call MD for:  extreme fatigue   Complete by: As directed    Call MD for:  persistant dizziness or light-headedness   Complete by: As directed    Call MD for:  persistant nausea and vomiting   Complete by: As directed    Call MD for:  severe uncontrolled pain   Complete by: As directed    Call MD for:  temperature >100.4   Complete by: As directed    Diet general   Complete by: As directed    Discharge instructions   Complete by: As directed    You were cared for by a hospitalist during your hospital stay. If you have any questions about your discharge medications or the care you received while you were in the hospital after you are discharged, you can call the unit and ask to speak with the hospitalist on call if the hospitalist that took care of you is not available. Once you are discharged, your primary care physician will handle any further medical issues. Please note that NO REFILLS for any discharge medications will be authorized once you are discharged, as it is imperative that you return to your primary care physician (or establish a relationship with  a primary care physician if you do not have one) for your aftercare needs so that they can reassess your need for medications and monitor your lab values.   Increase activity slowly   Complete by: As directed      Allergies as of 10/13/2018      Reactions    Morphine And Related Itching   Vicodin [hydrocodone-acetaminophen] Itching      Medication List    STOP taking these medications   acetaminophen 325 MG tablet Commonly known as: TYLENOL   alum & mag hydroxide-simeth [id number] MG/5ML suspension Commonly known as: MAALOX/MYLANTA   chlorhexidine 0.12 % solution Commonly known as: PERIDEX   levofloxacin 500 MG tablet Commonly known as: LEVAQUIN   megestrol 40 MG/ML suspension Commonly known as: MEGACE   ondansetron 4 MG tablet Commonly known as: ZOFRAN     TAKE these medications   cyanocobalamin 1000 MCG/ML injection Commonly known as: (VITAMIN B-12) Inject 1,000 mcg into the muscle every 30 (thirty) days.   dexamethasone 2 MG tablet Commonly known as: DECADRON Take 1 tablet (2 mg total) by mouth daily.   dronabinol 2.5 MG capsule Commonly known as: MARINOL Take 1 capsule (2.5 mg total) by mouth 2 (two) times daily before lunch and supper.   DULoxetine 30 MG capsule Commonly known as: CYMBALTA Take 1 capsule (30 mg total) by mouth daily.   enoxaparin 60 MG/0.6ML injection Commonly known as: LOVENOX Inject 0.6 mLs (60 mg total) into the skin every 12 (twelve) hours.   fentaNYL 100 MCG/HR Commonly known as: Cameron 1 patch onto the skin every 3 (three) days.   HYDROmorphone 2 MG tablet Commonly known as: DILAUDID Take 1 tablet (2 mg total) by mouth every 4 (four) hours as needed for severe pain. What changed: how much to take   lidocaine-prilocaine cream Commonly known as: EMLA Apply 1 application topically as needed.   magic mouthwash w/lidocaine Soln Take 15 mLs by mouth 4 (four) times daily.   multivitamin with minerals Tabs tablet Take 1 tablet by mouth daily.   pantoprazole 40 MG tablet Commonly known as: PROTONIX TAKE 1 TABLET BY MOUTH EVERY DAY   polyethylene glycol 17 g packet Commonly known as: MIRALAX / GLYCOLAX Take 17 g by mouth 2 (two) times daily. What changed:   when to  take this  reasons to take this   prochlorperazine 10 MG tablet Commonly known as: COMPAZINE Take 1 tablet (10 mg total) by mouth every 6 (six) hours as needed for nausea or vomiting.   senna-docusate 8.6-50 MG tablet Commonly known as: Senokot-S Take 2 tablets by mouth 2 (two) times daily.      Follow-up Information    [name], MD. Go on 10/16/2018.   Specialty: Oncology Contact information: [address] [place] [address] [phone]          Allergies  Allergen Reactions  . Morphine And Related Itching  . Vicodin [Hydrocodone-Acetaminophen] Itching    Consultations:  None    Procedures/Studies: Dg Chest 2 View  Result Date: 10/11/2018 CLINICAL DATA:  Pt reported vomiting and right side pains since Wednesday. Pt also reported some SOB. Medical hx of bladder cancer. Former smoker. EXAM: CHEST - 2 VIEW COMPARISON:  Chest radiograph 09/06/2018 FINDINGS: Stable cardiomediastinal contours. Right chest Port-A-Cath tip terminates at the cavoatrial junction. The lungs are clear. No pneumothorax or pleural effusion. Numerous osseous lesions consistent with known metastasis are better appreciated on prior CT. Vertebral wedge compression deformities appears similar  to prior. IMPRESSION: No acute cardiopulmonary process. Osseous metastatic disease better characterized on prior CT. Electronically Signed   By: Audie Pinto M.D.   On: 10/11/2018 16:58   Ct Angio Chest Pe W And/or Wo Contrast  Result Date: 10/11/2018 CLINICAL DATA:  Severe chest pain. History of metastatic prostate cancer. EXAM: CT ANGIOGRAPHY CHEST CT ABDOMEN AND PELVIS WITH CONTRAST TECHNIQUE: Multidetector CT imaging of the chest was performed using the standard protocol during bolus administration of intravenous contrast. Multiplanar CT image reconstructions and MIPs were obtained to evaluate the vascular anatomy. Multidetector CT imaging of the abdomen and pelvis was performed using the  standard protocol during bolus administration of intravenous contrast. CONTRAST:  113mL OMNIPAQUE IOHEXOL 350 MG/ML SOLN COMPARISON:  Chest CT 08/02/2018 and CT abdomen 07/20/2018 FINDINGS: CTA CHEST FINDINGS Cardiovascular: The heart is normal in size. No pericardial effusion. The aorta is normal in caliber. No dissection or atherosclerotic calcifications. Few scattered coronary artery calcifications are noted. Right-sided Port-A-Cath in good position with its tip near the cavoatrial junction. The pulmonary arterial tree is well opacified. There are small bilateral pulmonary emboli in the lower lobes. No large central pulmonary emboli. Mediastinum/Nodes: No mediastinal or hilar mass or lymphadenopathy. The mediastinal and hilar adenopathy seen on the prior study has resolved. Mildly dilated esophagus is noted. Lungs/Pleura: Patchy areas of subsegmental atelectasis and scarring changes. No worrisome pulmonary lesions to suggest pulmonary metastatic disease. Musculoskeletal: Numerous bilateral metastatic rib lesions but these are much improved when compared to the prior examination. The large soft tissue mass is associated with metastasis have resolved and the lesions appear healed or healing. I do not see any new destructive bony changes. The left medial clavicle region also appears improved. The proximal sternal lesion also demonstrates some healing changes. Extensive metastatic disease involving the thoracic spine also with some interval improvement/healing. No new lesions. Pathologic compression fractures are noted in the midthoracic spine. These are new since the prior study. This mainly involves the T8 and T9 vertebral bodies. There is significant disease in these vertebral bodies on the prior study. 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 Review of the MIP images confirms the above findings. CT ABDOMEN and PELVIS FINDINGS Hepatobiliary: No focal hepatic lesions to suggest recurrent metastatic disease.  The numerous small hepatic lesions have resolved. The gallbladder is normal. No common bile duct dilatation. Pancreas: No mass, inflammation or ductal dilatation. Spleen: Normal size.  No focal lesions. Adrenals/Urinary Tract: The adrenal glands and kidneys are unremarkable and stable. Small bilateral renal cysts. The neobladder is moderately distended. Stomach/Bowel: The stomach, duodenum, small bowel and colon are grossly normal. No acute inflammatory changes, mass lesions or obstructive findings. Vascular/Lymphatic: The aorta and branch vessels are normal. The major venous structures are patent. No mesenteric or retroperitoneal mass or adenopathy. Reproductive: Status post prostatectomy and cystectomy with neobladder. Other: No free pelvic fluid collections or pelvic abscess. No inguinal adenopathy. Musculoskeletal: Improved appearance of the large pelvic metastatic lesions. The soft tissue masses are smaller and the bone lesions demonstrate some healing changes. Review of the MIP images confirms the above findings. IMPRESSION: 1. Small bilateral pulmonary emboli. 2. Much improved CT appearance of the chest overall. The mediastinal and hilar adenopathy has resolved and the metastatic rib, sternal and left clavicle lesions appear improved. 3. Persistent significant metastatic disease involving the thoracic spine but there is also evidence of some improvement and no progressive findings. New pathologic compression fractures of T8 and T9. No spinal canal compromise. 4. Resolution of  hepatic metastatic disease. 5. Persistent lumbar spine and pelvic metastatic bone lesions but some of the show slight improvement with less significant soft tissue components and some bony healing changes. 6. Status post cysto prostatectomy with neobladder. No complicating features. No abdominal or pelvic lymphadenopathy. Electronically Signed   By: Marijo Sanes M.D.   On: 10/11/2018 19:44   Ct Abdomen Pelvis W Contrast  Result  Date: 10/11/2018 CLINICAL DATA:  Severe chest pain. History of metastatic prostate cancer. EXAM: CT ANGIOGRAPHY CHEST CT ABDOMEN AND PELVIS WITH CONTRAST TECHNIQUE: Multidetector CT imaging of the chest was performed using the standard protocol during bolus administration of intravenous contrast. Multiplanar CT image reconstructions and MIPs were obtained to evaluate the vascular anatomy. Multidetector CT imaging of the abdomen and pelvis was performed using the standard protocol during bolus administration of intravenous contrast. CONTRAST:  158mL OMNIPAQUE IOHEXOL 350 MG/ML SOLN COMPARISON:  Chest CT 08/02/2018 and CT abdomen 07/20/2018 FINDINGS: CTA CHEST FINDINGS Cardiovascular: The heart is normal in size. No pericardial effusion. The aorta is normal in caliber. No dissection or atherosclerotic calcifications. Few scattered coronary artery calcifications are noted. Right-sided Port-A-Cath in good position with its tip near the cavoatrial junction. The pulmonary arterial tree is well opacified. There are small bilateral pulmonary emboli in the lower lobes. No large central pulmonary emboli. Mediastinum/Nodes: No mediastinal or hilar mass or lymphadenopathy. The mediastinal and hilar adenopathy seen on the prior study has resolved. Mildly dilated esophagus is noted. Lungs/Pleura: Patchy areas of subsegmental atelectasis and scarring changes. No worrisome pulmonary lesions to suggest pulmonary metastatic disease. Musculoskeletal: Numerous bilateral metastatic rib lesions but these are much improved when compared to the prior examination. The large soft tissue mass is associated with metastasis have resolved and the lesions appear healed or healing. I do not see any new destructive bony changes. The left medial clavicle region also appears improved. The proximal sternal lesion also demonstrates some healing changes. Extensive metastatic disease involving the thoracic spine also with some interval  improvement/healing. No new lesions. Pathologic compression fractures are noted in the midthoracic spine. These are new since the prior study. This mainly involves the T8 and T9 vertebral bodies. There is significant disease in these vertebral bodies on the prior study. 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 Review of the MIP images confirms the above findings. CT ABDOMEN and PELVIS FINDINGS Hepatobiliary: No focal hepatic lesions to suggest recurrent metastatic disease. The numerous small hepatic lesions have resolved. The gallbladder is normal. No common bile duct dilatation. Pancreas: No mass, inflammation or ductal dilatation. Spleen: Normal size.  No focal lesions. Adrenals/Urinary Tract: The adrenal glands and kidneys are unremarkable and stable. Small bilateral renal cysts. The neobladder is moderately distended. Stomach/Bowel: The stomach, duodenum, small bowel and colon are grossly normal. No acute inflammatory changes, mass lesions or obstructive findings. Vascular/Lymphatic: The aorta and branch vessels are normal. The major venous structures are patent. No mesenteric or retroperitoneal mass or adenopathy. Reproductive: Status post prostatectomy and cystectomy with neobladder. Other: No free pelvic fluid collections or pelvic abscess. No inguinal adenopathy. Musculoskeletal: Improved appearance of the large pelvic metastatic lesions. The soft tissue masses are smaller and the bone lesions demonstrate some healing changes. Review of the MIP images confirms the above findings. IMPRESSION: 1. Small bilateral pulmonary emboli. 2. Much improved CT appearance of the chest overall. The mediastinal and hilar adenopathy has resolved and the metastatic rib, sternal and left clavicle lesions appear improved. 3. Persistent significant metastatic disease involving the thoracic spine  but there is also evidence of some improvement and no progressive findings. New pathologic compression fractures of T8 and T9.  No spinal canal compromise. 4. Resolution of hepatic metastatic disease. 5. Persistent lumbar spine and pelvic metastatic bone lesions but some of the show slight improvement with less significant soft tissue components and some bony healing changes. 6. Status post cysto prostatectomy with neobladder. No complicating features. No abdominal or pelvic lymphadenopathy. Electronically Signed   By: Marijo Sanes M.D.   On: 10/11/2018 19:44   Vas [place] Lower Extremity Venous (dvt)  Result Date: 10/13/2018  Lower Venous Study Indications: Pulmonary embolism.  Risk Factors: Confirmed PE Cancer metastatic prostate and bladder. Comparison Study: No previous study available Performing Technologist: Toma Copier RVS  Examination Guidelines: A complete evaluation includes B-mode imaging, spectral Doppler, color Doppler, and power Doppler as needed of all accessible portions of each vessel. Bilateral testing is considered an integral part of a complete examination. Limited examinations for reoccurring indications may be performed as noted.  +---------+---------------+---------+-----------+----------+-------------------+ RIGHT    CompressibilityPhasicitySpontaneityPropertiesThrombus Aging      +---------+---------------+---------+-----------+----------+-------------------+ CFV      Full           Yes      Yes                                      +---------+---------------+---------+-----------+----------+-------------------+ SFJ      Full                                                             +---------+---------------+---------+-----------+----------+-------------------+ FV Prox  Full           Yes      Yes                                      +---------+---------------+---------+-----------+----------+-------------------+ FV Mid   Full           Yes                                               +---------+---------------+---------+-----------+----------+-------------------+ FV  DistalPartial        No       Yes                  Distally at the                                                           knee Acute          +---------+---------------+---------+-----------+----------+-------------------+ PFV      Partial        No       Yes                  Proximal at the  confluence with the                                                       common femoral vein                                                       but not including.                                                        Acute               +---------+---------------+---------+-----------+----------+-------------------+ POP      Full           Yes      Yes                                      +---------+---------------+---------+-----------+----------+-------------------+ PTV      Full                                                             +---------+---------------+---------+-----------+----------+-------------------+ PERO     Full                                         Difficult to image                                                        due to shivering    +---------+---------------+---------+-----------+----------+-------------------+   Right Technical Findings: Technically difficult to image the calf veins due to shivering  +---------+---------------+---------+-----------+----------+-------------------+ LEFT     CompressibilityPhasicitySpontaneityPropertiesThrombus Aging      +---------+---------------+---------+-----------+----------+-------------------+ CFV      Partial        Yes      Yes                  AcuteAt the                                                               saphenofemoral  junction and                                                              distally             +---------+---------------+---------+-----------+----------+-------------------+ SFJ      Partial                                                          +---------+---------------+---------+-----------+----------+-------------------+ FV Prox  Full                                                             +---------+---------------+---------+-----------+----------+-------------------+ FV Mid   Full                                                             +---------+---------------+---------+-----------+----------+-------------------+ FV DistalFull                                                             +---------+---------------+---------+-----------+----------+-------------------+ PFV      Partial                                      Acute               +---------+---------------+---------+-----------+----------+-------------------+ POP      Full           Yes      Yes                                      +---------+---------------+---------+-----------+----------+-------------------+ PTV      Full                                                             +---------+---------------+---------+-----------+----------+-------------------+ PERO                                                  Unable to visualize  due to shivering    +---------+---------------+---------+-----------+----------+-------------------+   Left Technical Findings: Technically difficult to image the calf veins due to shivering   Summary: Right: Findings consistent with acute deep vein thrombosis involving the right femoral vein, and right proximal profunda vein. No cystic structure found in the popliteal fossa. See technical findings listed above Left: Findings consistent with acute deep vein thrombosis involving the left common femoral vein, and left proximal profunda vein. No cystic structure found in the popliteal  fossa. See technical findings listed above.  *See table(s) above for measurements and observations. Electronically signed by Curt Jews MD on 10/13/2018 at 9:49:47 AM.    Final     Echocardiogram 9/26  IMPRESSIONS   1. Left ventricular ejection fraction, by visual estimation, is 60 to 65%. The left ventricle has normal function. Left ventricular septal wall thickness was moderately increased. Moderately increased left ventricular posterior wall thickness. There is  moderately increased left ventricular hypertrophy.  2. Global right ventricle has normal systolic function.The right ventricular size is normal. No increase in right ventricular wall thickness.  3. Left atrial size was normal.  4. Right atrial size was normal.  5. The mitral valve is normal in structure. No evidence of mitral valve regurgitation. No evidence of mitral stenosis.  6. The tricuspid valve is normal in structure. Tricuspid valve regurgitation was not visualized by color flow Doppler.  7. The aortic valve is tricuspid Aortic valve regurgitation was not visualized by color flow Doppler. Structurally normal aortic valve, with no evidence of sclerosis or stenosis.  8. The pulmonic valve was not well visualized. Pulmonic valve regurgitation is not visualized by color flow Doppler.  9. The interatrial septum was not well visualized.   Discharge Exam: Vitals:   10/12/18 2104 10/13/18 0514  BP: 107/83 101/72  Pulse: 83 83  Resp: 17 16  Temp: 99.2 F (37.3 C) 98.7 F (37.1 C)  SpO2: 98% 99%     General: Pt is alert, awake, not in acute distress Cardiovascular: RRR, S1/S2 +, no edema Respiratory: CTA bilaterally, no wheezing, no rhonchi, no respiratory distress, no conversational dyspnea  Abdominal: Soft, NT, ND, bowel sounds + Extremities: no edema, no cyanosis Psych: Normal mood and affect, stable judgement and insight     The results of significant diagnostics from this hospitalization (including imaging,  microbiology, ancillary and laboratory) are listed below for reference.     Microbiology: Recent Results (from the past 240 hour(s))  SARS CORONAVIRUS 2 (TAT 6-24 HRS) Nasopharyngeal Nasopharyngeal Swab     Status: None   Collection Time: 10/11/18 10:08 PM   Specimen: Nasopharyngeal Swab  Result Value Ref Range Status   SARS Coronavirus 2 NEGATIVE NEGATIVE Final    Comment: (NOTE) SARS-CoV-2 target nucleic acids are NOT DETECTED. The SARS-CoV-2 RNA is generally detectable in upper and lower respiratory specimens during the acute phase of infection. Negative results do not preclude SARS-CoV-2 infection, do not rule out co-infections with other pathogens, and should not be used as the sole basis for treatment or other patient management decisions. Negative results must be combined with clinical observations, patient history, and epidemiological information. The expected result is Negative. Fact Sheet for Patients: SugarRoll[employer]be Fact Sheet for Healthcare Providers: https://www.woods-mathews.com/ This test is not yet approved or cleared by the [place] FDA and  has been authorized for detection and/or diagnosis of SARS-CoV-2 by FDA under an Emergency Use Authorization (EUA). This EUA will remain  in effect (meaning this test can be used) for the duration  of the COVID-19 declaration under Section 56 4(b)(1) of the Act, 21 U.S.C. section 360bbb-3(b)(1), unless the authorization is terminated or revoked sooner. Performed at [facility], [place] [address], [place], [place] [address]      Labs: BNP (last 3 results) Recent Labs    10/11/18 2208  BNP [id number]   Basic Metabolic Panel: Recent Labs  Lab 10/11/18 1610 10/12/18 0533  NA 136 141  K 4.2 3.9  CL 105 109  CO2 16* 21*  GLUCOSE 130* 94  BUN 24* 24*  CREATININE 0.64 0.76  CALCIUM 10.5* 9.4  MG 2.0  --    Liver Function Tests: Recent Labs  Lab 10/11/18 1610  AST 34   ALT 40  ALKPHOS 216*  BILITOT 0.5  PROT 7.1  ALBUMIN 3.9   Recent Labs  Lab 10/11/18 1610  LIPASE 25   No results for input(s): AMMONIA in the last 168 hours. CBC: Recent Labs  Lab 10/11/18 1610 10/12/18 0533 10/13/18 0508  WBC 2.6* 1.3* 1.2*  NEUTROABS 1.5*  --   --   HGB 8.8* 6.9* 7.8*  HCT 25.9* 21.3* 24.5*  MCV 95.6 98.2 98.0  PLT 125* 129* 179   Cardiac Enzymes: No results for input(s): CKTOTAL, CKMB, CKMBINDEX, TROPONINI in the last 168 hours. BNP: Invalid input(s): POCBNP CBG: No results for input(s): GLUCAP in the last 168 hours. D-Dimer No results for input(s): DDIMER in the last 72 hours. Hgb A1c No results for input(s): HGBA1C in the last 72 hours. Lipid Profile No results for input(s): CHOL, HDL, LDLCALC, TRIG, CHOLHDL, LDLDIRECT in the last 72 hours. Thyroid function studies No results for input(s): TSH, T4TOTAL, T3FREE, THYROIDAB in the last 72 hours.  Invalid input(s): FREET3 Anemia work up No results for input(s): VITAMINB12, FOLATE, FERRITIN, TIBC, IRON, RETICCTPCT in the last 72 hours. Urinalysis    Component Value Date/Time   COLORURINE AMBER (A) 10/11/2018 1623   APPEARANCEUR TURBID (A) 10/11/2018 1623   LABSPEC 1.006 10/11/2018 1623   PHURINE 9.0 (H) 10/11/2018 1623   GLUCOSEU NEGATIVE 10/11/2018 1623   HGBUR MODERATE (A) 10/11/2018 1623   BILIRUBINUR NEGATIVE 10/11/2018 1623   KETONESUR NEGATIVE 10/11/2018 1623   PROTEINUR 30 (A) 10/11/2018 1623   NITRITE NEGATIVE 10/11/2018 1623   LEUKOCYTESUR LARGE (A) 10/11/2018 1623   Sepsis Labs Invalid input(s): PROCALCITONIN,  WBC,  LACTICIDVEN Microbiology Recent Results (from the past 240 hour(s))  SARS CORONAVIRUS 2 (TAT 6-24 HRS) Nasopharyngeal Nasopharyngeal Swab     Status: None   Collection Time: 10/11/18 10:08 PM   Specimen: Nasopharyngeal Swab  Result Value Ref Range Status   SARS Coronavirus 2 NEGATIVE NEGATIVE Final    Comment: (NOTE) SARS-CoV-2 target nucleic acids are NOT  DETECTED. The SARS-CoV-2 RNA is generally detectable in upper and lower respiratory specimens during the acute phase of infection. Negative results do not preclude SARS-CoV-2 infection, do not rule out co-infections with other pathogens, and should not be used as the sole basis for treatment or other patient management decisions. Negative results must be combined with clinical observations, patient history, and epidemiological information. The expected result is Negative. Fact Sheet for Patients: SugarRoll.be Fact Sheet for Healthcare Providers: https://www.woods-mathews.com/ This test is not yet approved or cleared by the [place] FDA and  has been authorized for detection and/or diagnosis of SARS-CoV-2 by FDA under an Emergency Use Authorization (EUA). This EUA will remain  in effect (meaning this test can be used) for the duration of the COVID-19 declaration under Section  56 4(b)(1) of the Act, 21 U.S.C. section 360bbb-3(b)(1), unless the authorization is terminated or revoked sooner. Performed at [facility], [place] [address] [place], [place] [address]      Patient was seen and examined on the day of discharge and was found to be in stable condition. Time coordinating discharge: 25 minutes including assessment and coordination of care, as well as examination of the patient.   SIGNED:  Dessa Phi, DO [facility] 10/13/2018, 11:11 AM

## 2018-10-13 NOTE — Progress Notes (Addendum)
[place] for Heparin Indication: PE/DVT  Allergies  Allergen Reactions  . Morphine And Related Itching  . Vicodin [Hydrocodone-Acetaminophen] Itching    Patient Measurements: Height: 6\' 2"  (188 cm) Weight: 140 lb (63.5 kg) IBW/kg (Calculated) : 82.2 Heparin Dosing Weight: actual body weight   Vital Signs: Temp: 98.7 F (37.1 C) (09/27 0514) Temp Source: Oral (09/27 0514) BP: 101/72 (09/27 0514) Pulse Rate: 83 (09/27 0514)  Labs: Recent Labs    10/11/18 1610 10/11/18 1803 10/11/18 2208 10/11/18 2325  10/12/18 0533 10/12/18 1342 10/12/18 2028 10/13/18 0508  HGB 8.8*  --   --   --   --  6.9*  --   --  7.8*  HCT 25.9*  --   --   --   --  21.3*  --   --  24.5*  PLT 125*  --   --   --   --  129*  --   --  179  APTT  --   --  30  --   --   --   --   --   --   LABPROT  --   --  14.3  --   --   --   --   --   --   INR  --   --  1.1  --   --   --   --   --   --   HEPARINUNFRC  --   --   --   --    < > 0.78* 0.47 0.47 0.53  CREATININE 0.64  --   --   --   --  0.76  --   --   --   TROPONINIHS 27* 31*  --  34*  --  31*  --   --   --    < > = values in this interval not displayed.    Estimated Creatinine Clearance: 99.2 mL/min (by C-G formula based on SCr of 0.76 mg/dL).   Medical History: Past Medical History:  Diagnosis Date  . Anxiety   . Bladder tumor   . Blood in urine    saw some 3 days ago but none inthe last 2 days   . Bradycardia   . Depression   . Dizziness    historical   . Elevated PSA   . Family history of gastric cancer   . Heartburn    occasional   . Pain with urination   . Prostate cancer ([facility] [place])   . 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 Pharmacy consulted for IV heparin dosing. Baseline coags WNL. Patient not on any anticoagulants PTA.   Today, 10/13/18:  Heparin level = 0.53 units/mL, remains  therapeutic   Pancytopenic in setting of chemotherapy. Hgb improved 6.9 > 7.8 after transfusion of 1 unit PRBCs yesterday. Pltc improved to WNL.  No bleeding or infusion issues noted per nursing.  LE dopplers + for bilateral DVT  Goal of Therapy:  Heparin level 0.3-0.7 units/ml Monitor platelets by anticoagulation protocol: Yes   Plan:  Continue heparin infusion at 1250 units/hr Daily CBC, heparin level Monitor closely for s/sx of bleeding Noted plans to transition to enoxaparin prior to discharge   Lindell Spar, PharmD, BCPS Clinical Pharmacist   10/13/2018,10:07 AM     Addendum:  Now asked to transition patient to enoxaparin. SCr  0.76 with CrCl ~ 99 ml/min.   Plan: Stop heparin infusion at 11am. One hour later, start enoxaparin 60mg  SQ q12h (rounded to nearest syringe size-discussed with MD). Monitor CBC and for s/sx of bleeding.    Lindell Spar, PharmD, BCPS Clinical Pharmacist  10/13/2018 10:46 AM

## 2018-10-13 NOTE — Progress Notes (Signed)
10/13/2018  1245  Notified patient that per MD he does not need an antibiotic prescription d/t receiving the antibiotics in the hospital.

## 2018-10-13 NOTE — Plan of Care (Signed)
  Problem: Elimination: Goal: Will not experience complications related to bowel motility Outcome: Progressing   Problem: Nutrition: Goal: Adequate nutrition will be maintained Outcome: Progressing   Problem: Pain Managment: Goal: General experience of comfort will improve Outcome: Progressing   

## 2018-10-13 NOTE — Progress Notes (Signed)
10/13/2018  1215  Called tele monitor. Pt tele info has not been charted in Epic. Per Tele monitor she has already charted tele information on patient. Strip was printed. Pt is in NSR. Pt is ready for discharge.

## 2018-10-14 ENCOUNTER — Other Ambulatory Visit: Payer: Self-pay | Admitting: Oncology

## 2018-10-16 ENCOUNTER — Inpatient Hospital Stay: Payer: PRIVATE HEALTH INSURANCE

## 2018-10-16 ENCOUNTER — Inpatient Hospital Stay (HOSPITAL_BASED_OUTPATIENT_CLINIC_OR_DEPARTMENT_OTHER): Payer: PRIVATE HEALTH INSURANCE | Admitting: Oncology

## 2018-10-16 ENCOUNTER — Other Ambulatory Visit: Payer: Self-pay

## 2018-10-16 VITALS — BP 121/85 | HR 100 | Temp 98.3°F | Resp 18 | Ht 74.0 in | Wt 150.5 lb

## 2018-10-16 DIAGNOSIS — Z5111 Encounter for antineoplastic chemotherapy: Secondary | ICD-10-CM | POA: Diagnosis not present

## 2018-10-16 DIAGNOSIS — C7951 Secondary malignant neoplasm of bone: Secondary | ICD-10-CM

## 2018-10-16 DIAGNOSIS — C61 Malignant neoplasm of prostate: Secondary | ICD-10-CM

## 2018-10-16 DIAGNOSIS — C679 Malignant neoplasm of bladder, unspecified: Secondary | ICD-10-CM

## 2018-10-16 DIAGNOSIS — Z95828 Presence of other vascular implants and grafts: Secondary | ICD-10-CM

## 2018-10-16 DIAGNOSIS — C801 Malignant (primary) neoplasm, unspecified: Secondary | ICD-10-CM | POA: Diagnosis not present

## 2018-10-16 LAB — CMP (CANCER CENTER ONLY)
ALT: 31 U/L (ref 0–44)
AST: 22 U/L (ref 15–41)
Albumin: 3.3 g/dL — ABNORMAL LOW (ref 3.5–5.0)
Alkaline Phosphatase: 186 U/L — ABNORMAL HIGH (ref 38–126)
Anion gap: 12 (ref 5–15)
BUN: 8 mg/dL (ref 6–20)
CO2: 21 mmol/L — ABNORMAL LOW (ref 22–32)
Calcium: 9.4 mg/dL (ref 8.9–10.3)
Chloride: 108 mmol/L (ref 98–111)
Creatinine: 0.78 mg/dL (ref 0.61–1.24)
GFR, Est AFR Am: >60 mL/min (ref >60)
GFR, Estimated: >60 mL/min (ref >60)
Glucose, Bld: 128 mg/dL — ABNORMAL HIGH (ref 70–99)
Potassium: 3.6 mmol/L (ref 3.5–5.1)
Sodium: 141 mmol/L (ref 135–145)
Total Bilirubin: <0.2 mg/dL — ABNORMAL LOW (ref 0.3–1.2)
Total Protein: 6.3 g/dL — ABNORMAL LOW (ref 6.5–8.1)

## 2018-10-16 LAB — CBC WITH DIFFERENTIAL (CANCER CENTER ONLY)
Abs Immature Granulocytes: 0.44 10*3/uL — ABNORMAL HIGH (ref 0.00–0.07)
Basophils Absolute: 0 10*3/uL (ref 0.0–0.1)
Basophils Relative: 2 %
Eosinophils Absolute: 0 10*3/uL (ref 0.0–0.5)
Eosinophils Relative: 0 %
HCT: 28 % — ABNORMAL LOW (ref 39.0–52.0)
Hemoglobin: 9.1 g/dL — ABNORMAL LOW (ref 13.0–17.0)
Immature Granulocytes: 17 %
Lymphocytes Relative: 23 %
Lymphs Abs: 0.6 10*3/uL — ABNORMAL LOW (ref 0.7–4.0)
MCH: 31.7 pg (ref 26.0–34.0)
MCHC: 32.5 g/dL (ref 30.0–36.0)
MCV: 97.6 fL (ref 80.0–100.0)
Monocytes Absolute: 0.9 10*3/uL (ref 0.1–1.0)
Monocytes Relative: 35 %
Neutro Abs: 0.6 10*3/uL — ABNORMAL LOW (ref 1.7–7.7)
Neutrophils Relative %: 23 %
Platelet Count: 394 10*3/uL (ref 150–400)
RBC: 2.87 MIL/uL — ABNORMAL LOW (ref 4.22–5.81)
RDW: 19 % — ABNORMAL HIGH (ref 11.5–15.5)
WBC Count: 2.6 10*3/uL — ABNORMAL LOW (ref 4.0–10.5)
nRBC: 3.8 % — ABNORMAL HIGH (ref 0.0–0.2)

## 2018-10-16 MED ORDER — SODIUM CHLORIDE 0.9 % IV SOLN
639.0000 mg | Freq: Once | INTRAVENOUS | Status: AC
  Administered 2018-10-16: 12:00:00 640 mg via INTRAVENOUS
  Filled 2018-10-16: qty 64

## 2018-10-16 MED ORDER — SODIUM CHLORIDE 0.9% FLUSH
10.0000 mL | Freq: Once | INTRAVENOUS | Status: AC
  Administered 2018-10-16: 09:00:00 10 mL
  Filled 2018-10-16: qty 10

## 2018-10-16 MED ORDER — SODIUM CHLORIDE 0.9 % IV SOLN
1000.0000 mg/m2 | Freq: Once | INTRAVENOUS | Status: AC
  Administered 2018-10-16: 1786 mg via INTRAVENOUS
  Filled 2018-10-16: qty 46.97

## 2018-10-16 MED ORDER — PALONOSETRON HCL INJECTION 0.25 MG/5ML
INTRAVENOUS | Status: AC
  Filled 2018-10-16: qty 5

## 2018-10-16 MED ORDER — HYDROMORPHONE HCL 2 MG PO TABS: 4.0000 mg | ORAL_TABLET | ORAL | 0 refills | Status: DC | PRN

## 2018-10-16 MED ORDER — HEPARIN SOD (PORK) LOCK FLUSH 100 UNIT/ML IV SOLN
500.0000 [IU] | Freq: Once | INTRAVENOUS | Status: AC | PRN
  Administered 2018-10-16: 500 [IU]
  Filled 2018-10-16: qty 5

## 2018-10-16 MED ORDER — DEXAMETHASONE 2 MG PO TABS: ORAL_TABLET | ORAL | 0 refills | Status: DC

## 2018-10-16 MED ORDER — SODIUM CHLORIDE 0.9 % IV SOLN
Freq: Once | INTRAVENOUS | Status: AC
  Administered 2018-10-16: 10:00:00 via INTRAVENOUS
  Filled 2018-10-16: qty 250

## 2018-10-16 MED ORDER — SODIUM CHLORIDE 0.9% FLUSH
10.0000 mL | INTRAVENOUS | Status: DC | PRN
  Administered 2018-10-16: 10 mL
  Filled 2018-10-16: qty 10

## 2018-10-16 MED ORDER — PALONOSETRON HCL INJECTION 0.25 MG/5ML
0.2500 mg | Freq: Once | INTRAVENOUS | Status: AC
  Administered 2018-10-16: 0.25 mg via INTRAVENOUS

## 2018-10-16 MED ORDER — SODIUM CHLORIDE 0.9 % IV SOLN
Freq: Once | INTRAVENOUS | Status: AC
  Administered 2018-10-16: 10:00:00 via INTRAVENOUS
  Filled 2018-10-16: qty 5

## 2018-10-16 MED ORDER — DULOXETINE HCL 30 MG PO CPEP: 30.0000 mg | ORAL_CAPSULE | Freq: Every day | ORAL | 3 refills | Status: DC

## 2018-10-16 NOTE — Addendum Note (Signed)
Addended by: [name] on: 10/16/2018 09:22 AM   Modules accepted: Orders

## 2018-10-16 NOTE — Patient Instructions (Signed)
[facility] Discharge Instructions for Patients Receiving Chemotherapy  Today you received the following chemotherapy agents: Gemzar/Carboplatin.  To help prevent nausea and vomiting after your treatment, we encourage you to take your nausea medication as directed.   If you develop nausea and vomiting that is not controlled by your nausea medication, call the clinic.   BELOW ARE SYMPTOMS THAT SHOULD BE REPORTED IMMEDIATELY:  *FEVER GREATER THAN 100.5 F  *CHILLS WITH OR WITHOUT FEVER  NAUSEA AND VOMITING THAT IS NOT CONTROLLED WITH YOUR NAUSEA MEDICATION  *UNUSUAL SHORTNESS OF BREATH  *UNUSUAL BRUISING OR BLEEDING  TENDERNESS IN MOUTH AND THROAT WITH OR WITHOUT PRESENCE OF ULCERS  *URINARY PROBLEMS  *BOWEL PROBLEMS  UNUSUAL RASH Items with * indicate a potential emergency and should be followed up as soon as possible.  Feel free to call the clinic should you have any questions or concerns. The clinic phone number is [phone].  Please show the CHEMO ALERT CARD at check-in to the Emergency Department and triage nurse.   

## 2018-10-16 NOTE — Progress Notes (Signed)
Hematology and Oncology Follow Up Visit  Rodney [name] ID:[id number] 1970-08-23 48 y.o. 10/16/2018 8:48 AM [name], [name], MDBurdine, [name], MD   Principle Diagnosis: 48 year old man with:  1.  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  Prostate cancer diagnosed in 2019.  He is status post prostatectomy for Gleason score 6 disease in 2019.  Prior Therapy:  He is status post a robotic assisted laparoscopic radical cystoprostatectomy and bilateral pelvic lymphadenectomy and neobladder creation completed on November 08, 2017 by Dr. Alinda Money.  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  Prostate adenocarcinoma is 3+4 = 7 with 0 out of 14 lymph nodes noted.  He is status post radiation to the cervical and thoracic spine for a total of 30 Gy in 10 fractions completed on August 06, 2018.  Current therapy: Gemcitabine and cisplatin cycle 1 started on 08/13/2018.  Cisplatin was switched to carboplatin for better tolerance with cycle 2.  He is here for start of cycle 4.   Interim History: Mr. Cefalo returns today for a follow-up.  Since the last visit, he was hospitalized between 10/11/2018 and 10/13/2018 for E. coli bacteremia and was discharged without any major complications.  Since his discharge, he reports feeling reasonably well without any major complications.  He did receive 1 unit of packed red cell transfusion.  He was also diagnosed with pulmonary embolism and deep vein thrombosis and currently on Lovenox.   Patient denied headaches, blurry vision, syncope or seizures.  Denies any fevers, chills or sweats.  Denied chest pain, palpitation, orthopnea or leg edema.  Denied cough, wheezing or hemoptysis.  Denied nausea, vomiting or abdominal pain.  Denies any constipation or diarrhea.  Denies any frequency urgency or hesitancy.  Denies any arthralgias or myalgias.  Denies any skin rashes or  lesions.  Denies any bleeding or clotting tendency.  Denies any easy bruising.  Denies any hair or nail changes.  Denies any anxiety or depression.  Remaining review of system is negative.                   Medications: Unchanged on review. Current Outpatient Medications  Medication Sig Dispense Refill  . cyanocobalamin (,VITAMIN B-12,) 1000 MCG/ML injection Inject 1,000 mcg into the muscle every 30 (thirty) days.    [name] dexamethasone (DECADRON) 2 MG tablet Take 1 tablet (2 mg total) by mouth daily. 20 tablet 0  . dronabinol (MARINOL) 2.5 MG capsule TAKE ONE CAPSULE BY MOUTH TWICE DAILY BEFORE LUNCH AND SUPPER 14 capsule 0  . DULoxetine (CYMBALTA) 30 MG capsule Take 1 capsule (30 mg total) by mouth daily. 30 capsule 0  . enoxaparin (LOVENOX) 60 MG/0.6ML injection Inject 0.6 mLs (60 mg total) into the skin every 12 (twelve) hours. 36 mL 2  . fentaNYL (DURAGESIC) 100 MCG/HR Place 1 patch onto the skin every 3 (three) days. 3 patch 0  . HYDROmorphone (DILAUDID) 2 MG tablet Take 1 tablet (2 mg total) by mouth every 4 (four) hours as needed for severe pain. (Patient taking differently: Take 4 mg by mouth every 4 (four) hours as needed for severe pain. ) 90 tablet 0  . lidocaine-prilocaine (EMLA) cream Apply 1 application topically as needed. 30 g 0  . magic mouthwash w/lidocaine SOLN Take 15 mLs by mouth 4 (four) times daily. 400 mL 0  . Multiple Vitamin (MULTIVITAMIN WITH MINERALS) TABS tablet Take 1  tablet by mouth daily.    . pantoprazole (PROTONIX) 40 MG tablet TAKE 1 TABLET BY MOUTH EVERY DAY (Patient taking differently: Take 40 mg by mouth daily. ) 30 tablet 0  . polyethylene glycol (MIRALAX / GLYCOLAX) 17 g packet Take 17 g by mouth 2 (two) times daily. (Patient taking differently: Take 17 g by mouth daily as needed for mild constipation. ) 14 each 0  . prochlorperazine (COMPAZINE) 10 MG tablet Take 1 tablet (10 mg total) by mouth every 6 (six) hours as needed for nausea or  vomiting. 30 tablet 0  . senna-docusate (SENOKOT-S) 8.6-50 MG tablet Take 2 tablets by mouth 2 (two) times daily. 60 tablet 0   No current facility-administered medications for this visit.      Allergies:  Allergies  Allergen Reactions  . Morphine And Related Itching  . Vicodin [Hydrocodone-Acetaminophen] Itching    Past Medical History, Surgical history, Social history, and Family History without any changes on review.    Physical Exam:  Blood pressure 121/85, pulse 100, temperature 98.3 F (36.8 C), temperature source Oral, resp. rate 18, height 6\' 2"  (1.88 m), weight 150 lb 8 oz (68.3 kg), SpO2 100 %.     ECOG: 2     General appearance: Alert, awake without any distress. Head: Atraumatic without abnormalities Oropharynx: Without any thrush or ulcers. Eyes: No scleral icterus. Lymph nodes: No lymphadenopathy noted in the cervical, supraclavicular, or axillary nodes Heart:regular rate and rhythm, without any murmurs or gallops.   Lung: Clear to auscultation without any rhonchi, wheezes or dullness to percussion. Abdomin: Soft, nontender without any shifting dullness or ascites. Musculoskeletal: No clubbing or cyanosis. Neurological: No motor or sensory deficits. Skin: No rashes or lesions.     IMPRESSION: 1. Small bilateral pulmonary emboli. 2. Much improved CT appearance of the chest overall. The mediastinal and hilar adenopathy has resolved and the metastatic rib, sternal and left clavicle lesions appear improved. 3. Persistent significant metastatic disease involving the thoracic spine but there is also evidence of some improvement and no progressive findings. New pathologic compression fractures of T8 and T9. No spinal canal compromise. 4. Resolution of hepatic metastatic disease. 5. Persistent lumbar spine and pelvic metastatic bone lesions but some of the show slight improvement with less significant soft tissue components and some bony healing  changes. 6. Status post cysto prostatectomy with neobladder. No complicating features. No abdominal or pelvic lymphadenopathy.        Lab Results: Lab Results  Component Value Date   WBC 1.2 (LL) 10/13/2018   HGB 7.8 (L) 10/13/2018   HCT 24.5 (L) 10/13/2018   MCV 98.0 10/13/2018   PLT 179 10/13/2018     Chemistry      Component Value Date/Time   NA 141 10/12/2018 0533   K 3.9 10/12/2018 0533   CL 109 10/12/2018 0533   CO2 21 (L) 10/12/2018 0533   BUN 24 (H) 10/12/2018 0533   CREATININE 0.76 10/12/2018 0533   CREATININE 0.70 10/02/2018 0829      Component Value Date/Time   CALCIUM 9.4 10/12/2018 0533   ALKPHOS 216 (H) 10/11/2018 1610   AST 34 10/11/2018 1610   AST 31 10/02/2018 0829   ALT 40 10/11/2018 1610   ALT 27 10/02/2018 0829   BILITOT 0.5 10/11/2018 1610   BILITOT 0.2 (L) 10/02/2018 0829        Impression and Plan:   48 year old with:   1.  Bladder cancer diagnosed in 2019.  He subsequently found to  have stage IV disease with diffuse metastasis.  He is currently receiving systemic chemotherapy with platinum and gemcitabine without any major complications.  CT scan obtained on 10/11/2018 was personally reviewed and discussed with the patient and showed excellent response to therapy.  Risks and benefits of continuing this therapy was discussed.  Potential complications were reiterated including cytopenias, infections, renal insufficiency among others.  He is agreeable to continue at this time.   2.  IV access:  Port-A-Cath has been used for chemotherapy without any complications.  I recommended continuing with that.   3.  Antiemetics: Compazine is available to him without any recent nausea or vomiting.   4.  Renal function surveillance:  Kidney function remains close to normal range at this time.   5.  Goals of care: 's disease is incurable although aggressive measures are warranted given his young age and aggressive disease.   6.  Bone pain:  Manageable with fentanyl and breakthrough Dilaudid.  7.  Weight loss and failure to thrive: Continues to improve and gain weight.  8.  Deep vein thrombosis and pulmonary embolism: He is currently on Lovenox which I recommended continuing indefinitely.  9.  Neutropenia: We will add growth factor support after each cycle of therapy.  9.  Follow-up: In 1 week for day 8 of chemotherapy and in 3 weeks for the start of cycle 5.   25 minutes was spent with the patient face-to-face today.  More than 50% of time was spent on reviewing his laboratory data, imaging studies and answering questions regarding future plan of care.    Zola Button, MD 9/30/20208:48 AM

## 2018-10-16 NOTE — Progress Notes (Signed)
Per Dr. [name], it is ok to treat today with ANC 0.6

## 2018-10-18 ENCOUNTER — Telehealth: Payer: Self-pay | Admitting: Oncology

## 2018-10-18 NOTE — Telephone Encounter (Signed)
Called and spoke with patient.confirmed appts °

## 2018-10-21 ENCOUNTER — Telehealth: Payer: Self-pay

## 2018-10-21 ENCOUNTER — Inpatient Hospital Stay: Payer: PRIVATE HEALTH INSURANCE

## 2018-10-21 ENCOUNTER — Other Ambulatory Visit: Payer: Self-pay | Admitting: Medical

## 2018-10-21 ENCOUNTER — Other Ambulatory Visit: Payer: Self-pay

## 2018-10-21 ENCOUNTER — Inpatient Hospital Stay: Payer: PRIVATE HEALTH INSURANCE | Attending: Oncology | Admitting: Medical

## 2018-10-21 VITALS — BP 108/80 | HR 89 | Temp 98.3°F

## 2018-10-21 DIAGNOSIS — F419 Anxiety disorder, unspecified: Secondary | ICD-10-CM | POA: Insufficient documentation

## 2018-10-21 DIAGNOSIS — Z79899 Other long term (current) drug therapy: Secondary | ICD-10-CM | POA: Diagnosis not present

## 2018-10-21 DIAGNOSIS — D709 Neutropenia, unspecified: Secondary | ICD-10-CM | POA: Insufficient documentation

## 2018-10-21 DIAGNOSIS — Z5111 Encounter for antineoplastic chemotherapy: Secondary | ICD-10-CM | POA: Insufficient documentation

## 2018-10-21 DIAGNOSIS — Z87891 Personal history of nicotine dependence: Secondary | ICD-10-CM | POA: Insufficient documentation

## 2018-10-21 DIAGNOSIS — C7951 Secondary malignant neoplasm of bone: Secondary | ICD-10-CM | POA: Insufficient documentation

## 2018-10-21 DIAGNOSIS — C801 Malignant (primary) neoplasm, unspecified: Secondary | ICD-10-CM

## 2018-10-21 DIAGNOSIS — Z7901 Long term (current) use of anticoagulants: Secondary | ICD-10-CM | POA: Insufficient documentation

## 2018-10-21 DIAGNOSIS — Z923 Personal history of irradiation: Secondary | ICD-10-CM | POA: Diagnosis not present

## 2018-10-21 DIAGNOSIS — C787 Secondary malignant neoplasm of liver and intrahepatic bile duct: Secondary | ICD-10-CM | POA: Insufficient documentation

## 2018-10-21 DIAGNOSIS — Z5189 Encounter for other specified aftercare: Secondary | ICD-10-CM | POA: Insufficient documentation

## 2018-10-21 DIAGNOSIS — G893 Neoplasm related pain (acute) (chronic): Secondary | ICD-10-CM | POA: Insufficient documentation

## 2018-10-21 DIAGNOSIS — R112 Nausea with vomiting, unspecified: Secondary | ICD-10-CM | POA: Diagnosis not present

## 2018-10-21 DIAGNOSIS — Z800 Family history of malignant neoplasm of digestive organs: Secondary | ICD-10-CM | POA: Diagnosis not present

## 2018-10-21 DIAGNOSIS — C679 Malignant neoplasm of bladder, unspecified: Secondary | ICD-10-CM | POA: Diagnosis present

## 2018-10-21 DIAGNOSIS — Z95828 Presence of other vascular implants and grafts: Secondary | ICD-10-CM

## 2018-10-21 DIAGNOSIS — C61 Malignant neoplasm of prostate: Secondary | ICD-10-CM | POA: Diagnosis not present

## 2018-10-21 DIAGNOSIS — Z8042 Family history of malignant neoplasm of prostate: Secondary | ICD-10-CM | POA: Insufficient documentation

## 2018-10-21 LAB — CMP (CANCER CENTER ONLY)
ALT: 82 U/L — ABNORMAL HIGH (ref 0–44)
AST: 46 U/L — ABNORMAL HIGH (ref 15–41)
Albumin: 3.2 g/dL — ABNORMAL LOW (ref 3.5–5.0)
Alkaline Phosphatase: 175 U/L — ABNORMAL HIGH (ref 38–126)
Anion gap: 9 (ref 5–15)
BUN: 16 mg/dL (ref 6–20)
CO2: 21 mmol/L — ABNORMAL LOW (ref 22–32)
Calcium: 9.5 mg/dL (ref 8.9–10.3)
Chloride: 107 mmol/L (ref 98–111)
Creatinine: 0.74 mg/dL (ref 0.61–1.24)
GFR, Est AFR Am: >60 mL/min
GFR, Estimated: >60 mL/min
Glucose, Bld: 104 mg/dL — ABNORMAL HIGH (ref 70–99)
Potassium: 4.6 mmol/L (ref 3.5–5.1)
Sodium: 137 mmol/L (ref 135–145)
Total Bilirubin: 0.3 mg/dL (ref 0.3–1.2)
Total Protein: 6.2 g/dL — ABNORMAL LOW (ref 6.5–8.1)

## 2018-10-21 LAB — CBC WITH DIFFERENTIAL (CANCER CENTER ONLY)
Abs Immature Granulocytes: 0.03 10*3/uL (ref 0.00–0.07)
Basophils Absolute: 0 10*3/uL (ref 0.0–0.1)
Basophils Relative: 1 %
Eosinophils Absolute: 0 10*3/uL (ref 0.0–0.5)
Eosinophils Relative: 0 %
HCT: 28.5 % — ABNORMAL LOW (ref 39.0–52.0)
Hemoglobin: 9.4 g/dL — ABNORMAL LOW (ref 13.0–17.0)
Immature Granulocytes: 2 %
Lymphocytes Relative: 25 %
Lymphs Abs: 0.4 10*3/uL — ABNORMAL LOW (ref 0.7–4.0)
MCH: 32.1 pg (ref 26.0–34.0)
MCHC: 33 g/dL (ref 30.0–36.0)
MCV: 97.3 fL (ref 80.0–100.0)
Monocytes Absolute: 0.1 10*3/uL (ref 0.1–1.0)
Monocytes Relative: 7 %
Neutro Abs: 1.1 10*3/uL — ABNORMAL LOW (ref 1.7–7.7)
Neutrophils Relative %: 65 %
Platelet Count: 357 10*3/uL (ref 150–400)
RBC: 2.93 MIL/uL — ABNORMAL LOW (ref 4.22–5.81)
RDW: 17.2 % — ABNORMAL HIGH (ref 11.5–15.5)
WBC Count: 1.6 10*3/uL — ABNORMAL LOW (ref 4.0–10.5)
nRBC: 0 % (ref 0.0–0.2)

## 2018-10-21 LAB — MAGNESIUM: Magnesium: 1.9 mg/dL (ref 1.7–2.4)

## 2018-10-21 MED ORDER — SODIUM CHLORIDE 0.9 % IV SOLN
Freq: Once | INTRAVENOUS | Status: AC
  Administered 2018-10-21: 15:00:00 via INTRAVENOUS
  Filled 2018-10-21: qty 250

## 2018-10-21 MED ORDER — LORAZEPAM 1 MG PO TABS
0.5000 mg | ORAL_TABLET | Freq: Once | ORAL | Status: AC
  Administered 2018-10-21: 0.5 mg via ORAL

## 2018-10-21 MED ORDER — HEPARIN SOD (PORK) LOCK FLUSH 100 UNIT/ML IV SOLN
500.0000 [IU] | Freq: Once | INTRAVENOUS | Status: AC
  Administered 2018-10-21: 500 [IU]
  Filled 2018-10-21: qty 5

## 2018-10-21 MED ORDER — LORAZEPAM 0.5 MG PO TABS: 0.5000 mg | ORAL_TABLET | Freq: Three times a day (TID) | ORAL | 0 refills | Status: DC | PRN

## 2018-10-21 MED ORDER — SODIUM CHLORIDE 0.9% FLUSH
10.0000 mL | Freq: Once | INTRAVENOUS | Status: AC
  Administered 2018-10-21: 16:00:00 10 mL
  Filled 2018-10-21: qty 10

## 2018-10-21 NOTE — Progress Notes (Signed)
[facility] Progress Note   Rodney [name] ID:[id number] Dec 20, 1970 48 y.o.  Rodney [name] is managed by Dr. 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 He was also given a prescription for Ativan 0.5 mg SL Q 8 hours prn nausea and vomiting.  Metastatic neoplasm of unknown primary: Rodney [name] continues to see Dr. [name] and is status post cycle #4 of carboplatin and gemcitabine which was dosed on 10/16/2018. He is scheduled to be seen in follow up on 11/06/2018.  Please see After Visit Summary for patient specific instructions.  Future Appointments  Date Time Provider [place]  10/23/2018  2:00 PM [facility] LAB 2 [facility] None  10/23/2018  2:15 PM [facility] [place] FLUSH [facility] None  10/23/2018  3:00 PM [facility] INFUSION [facility] None  10/23/2018  3:15 PM Neff, Barbara L, RD [facility] None  11/06/2018 11:00 AM [facility] LAB 3 [facility] None  11/06/2018 11:15 AM [facility] [place] FLUSH [facility] None  11/06/2018 11:45 AM Shadad, Mathis Dad, MD [facility] None  11/06/2018 12:45 PM [facility] INFUSION [facility] None  11/13/2018  8:00 AM [facility] LAB 6  [facility] None  11/13/2018  8:15 AM [facility] [place] FLUSH [facility] None  11/13/2018  9:00 AM [facility] INFUSION [facility] None    No orders of the defined types were placed in this encounter.      Subjective:   Patient ID:  Rodney [name] is a 48 y.o. (DOB 12/13/1970) male.  Chief Complaint:  Chief Complaint  Patient presents with   Emesis    HPI Rodney [name]  Is a 48 y.o. male with a diagnosis of prostate cancer dating to 2019. He is now being managed by Dr. [name] for a metastatic bladder cancer. He is status post cycle #4 of carboplatin and gemcitabine which was dosed on 10/16/2018. He presents to the clinic with ongoing nausea and vomiting despite his use of Compazine. He had previously been treated with Zofran with good control of his nausea. This was changed to Compazine. He denies abdominal pain, chest pain, shortness of breath, dizziness, weakness, or fatigue. He believes that he is eating and drinking well.  Medications: I have reviewed the patient's current medications.  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[facility])     Past Surgical History:  Procedure Laterality Date   BACK SURGERY  2012   3 disc; dr Carloyn Manner  did first and dr elsner did the last 2    CYSTOSCOPY W/ RETROGRADES N/A 07/02/2017   Procedure: CYSTOSCOPY WITH RETROGRADE PYELOGRAM/EXAM UNDER ANESTHESIA;  Surgeon: Raynelle Bring, MD;  Location: [facility];  Service: Urology;  Laterality: N/A;   CYSTOSCOPY WITH BIOPSY N/A 08/09/2017   Procedure: CYSTOSCOPY WITH  PROSTATE NEEDLE BIOPSY;  Surgeon: Raynelle Bring, MD;  Location: [facility];  Service: Urology;  Laterality: N/A;  GENERAL ANESTHESIA WITH PARALYSIS/ ONLY NEEDS 60 MIN FOR ALL PROCEDURES   IR IMAGING GUIDED PORT INSERTION  08/07/2018   LEG SURGERY  2012   4 metal plates in plates    TRANSRECTAL ULTRASOUND N/A 08/09/2017   Procedure: TRANSRECTAL ULTRASOUND;  Surgeon: Raynelle Bring, MD;  Location: [facility];  Service: Urology;  Laterality: N/A;  ONLY NEEDS 60 MIN FOR ALL PROCEDURES   TRANSURETHRAL RESECTION OF BLADDER TUMOR N/A 07/02/2017   Procedure: TRANSURETHRAL RESECTION OF BLADDER TUMOR (TURBT);  Surgeon: Raynelle Bring, MD;  Location: [facility];  Service: Urology;  Laterality: N/A;   TRANSURETHRAL RESECTION OF BLADDER TUMOR N/A 08/09/2017   Procedure: TRANSURETHRAL RESECTION OF BLADDER TUMOR (TURBT);  Surgeon: Raynelle Bring, MD;  Location: [facility];  Service: Urology;  Laterality: N/A;    Family History  Problem Relation Age of Onset   Heart attack Father    Hypertension Father    Diabetes Father    Heart failure Father    Hypertension Mother    Gastric cancer Sister 52       d. 25   Prostate cancer Brother 43   Melanoma Maternal Aunt    Stroke Maternal Uncle    Stroke Paternal Uncle    Melanoma Maternal Grandmother        dx under 52   Colon cancer Maternal Grandmother    Heart disease Maternal Grandfather    Heart attack Paternal Grandfather     Social History   Socioeconomic History   Marital status: Significant Other    Spouse name: Not on file   Number of children: Not on file   Years of education: Not on file   Highest education level: Not on file  Occupational History   Not on file  Social Needs   Financial resource strain: Not on file   Food insecurity    Worry: Not on file    Inability: Not on file   Transportation needs    Medical: Not on file    Non-medical: Not on file  Tobacco Use   Smoking status: Former Smoker    Packs/day: 2.00     Years: 8.00    Pack years: 16.00    Types: Cigarettes    Quit date: 05/29/2016    Years since quitting: 2.4   Smokeless tobacco: Former Systems developer   Tobacco comment: [place] 2009  Substance and Sexual Activity   Alcohol use: Not Currently   Drug use: Not Currently    Types: Marijuana    Comment: last use was 05-29-2017   Sexual activity: Yes  Lifestyle   Physical activity    Days per week: Not on file    Minutes per session: Not on file   Stress: Not on file  Relationships   Social connections    Talks on phone: Not on file    Gets  together: Not on file    Attends religious service: Not on file    Active member of club or organization: Not on file    Attends meetings of clubs or organizations: Not on file    Relationship status: Not on file   Intimate partner violence    Fear of current or ex partner: Not on file    Emotionally abused: Not on file    Physically abused: Not on file    Forced sexual activity: Not on file  Other Topics Concern   Not on file  Social History Narrative   Not on file    Past Medical History, Surgical history, Social history, and Family history were reviewed and updated as appropriate.   Please see review of systems for further details on the patient's review from today.   Review of Systems:  Review of Systems  Constitutional: Negative for appetite change, chills, diaphoresis and fever.  Respiratory: Negative for cough, choking, shortness of breath and wheezing.   Cardiovascular: Negative for chest pain and palpitations.  Gastrointestinal: Positive for nausea and vomiting. Negative for constipation and diarrhea.  Genitourinary: Negative for decreased urine volume.  Neurological: Negative for headaches.    Objective:   Physical Exam:  BP 108/80    Pulse 89    Temp 98.3 F (36.8 C) (Oral)    SpO2 97%  ECOG: 1  Orthostatic Blood Pressure: Blood pressure:   sitting 103/83, standing 91/72 Pulse:   sitting 108, standing  140    Physical Exam Constitutional:      General: He is not in acute distress.    Appearance: He is not diaphoretic.  HENT:     Head: Normocephalic and atraumatic.  Cardiovascular:     Rate and Rhythm: Normal rate and regular rhythm.     Heart sounds: Normal heart sounds. No murmur. No friction rub. No gallop.   Pulmonary:     Effort: Pulmonary effort is normal. No respiratory distress.     Breath sounds: Normal breath sounds. No wheezing or rales.  Skin:    General: Skin is warm and dry.     Findings: No erythema or rash.  Neurological:     Mental Status: He is alert.     Lab Review:     Component Value Date/Time   NA 137 10/21/2018 1323   K 4.6 10/21/2018 1323   CL 107 10/21/2018 1323   CO2 21 (L) 10/21/2018 1323   GLUCOSE 104 (H) 10/21/2018 1323   BUN 16 10/21/2018 1323   CREATININE 0.74 10/21/2018 1323   CALCIUM 9.5 10/21/2018 1323   PROT 6.2 (L) 10/21/2018 1323   ALBUMIN 3.2 (L) 10/21/2018 1323   AST 46 (H) 10/21/2018 1323   ALT 82 (H) 10/21/2018 1323   ALKPHOS 175 (H) 10/21/2018 1323   BILITOT 0.3 10/21/2018 1323   GFRNONAA >60 10/21/2018 1323   GFRAA >60 10/21/2018 1323       Component Value Date/Time   WBC 1.6 (L) 10/21/2018 1323   WBC 1.2 (LL) 10/13/2018 0508   RBC 2.93 (L) 10/21/2018 1323   HGB 9.4 (L) 10/21/2018 1323   HCT 28.5 (L) 10/21/2018 1323   PLT 357 10/21/2018 1323   MCV 97.3 10/21/2018 1323   MCH 32.1 10/21/2018 1323   MCHC 33.0 10/21/2018 1323   RDW 17.2 (H) 10/21/2018 1323   LYMPHSABS 0.4 (L) 10/21/2018 1323   MONOABS 0.1 10/21/2018 1323   EOSABS 0.0 10/21/2018 1323   BASOSABS 0.0 10/21/2018 1323   -------------------------------  Imaging from last 24 hours (if applicable):  Radiology interpretation: Dg Chest 2 View  Result Date: 10/11/2018 CLINICAL DATA:  Pt reported vomiting and right side pains since Wednesday. Pt also reported some SOB. Medical hx of bladder cancer. Former smoker. EXAM: CHEST - 2 VIEW COMPARISON:  Chest  radiograph 09/06/2018 FINDINGS: Stable cardiomediastinal contours. Right chest Port-A-Cath tip terminates at the cavoatrial junction. The lungs are clear. No pneumothorax or pleural effusion. Numerous osseous lesions consistent with known metastasis are better appreciated on prior CT. Vertebral wedge compression deformities appears similar to prior. IMPRESSION: No acute cardiopulmonary process. Osseous metastatic disease better characterized on prior CT. Electronically Signed   By: Audie Pinto M.D.   On: 10/11/2018 16:58   Ct Angio Chest Pe W And/or Wo Contrast  Result Date: 10/11/2018 CLINICAL DATA:  Severe chest pain. History of metastatic prostate cancer. EXAM: CT ANGIOGRAPHY CHEST CT ABDOMEN AND PELVIS WITH CONTRAST TECHNIQUE: Multidetector CT imaging of the chest was performed using the standard protocol during bolus administration of intravenous contrast. Multiplanar CT image reconstructions and MIPs were obtained to evaluate the vascular anatomy. Multidetector CT imaging of the abdomen and pelvis was performed using the standard protocol during bolus administration of intravenous contrast. CONTRAST:  142mL OMNIPAQUE IOHEXOL 350 MG/ML SOLN COMPARISON:  Chest CT 08/02/2018 and CT abdomen 07/20/2018 FINDINGS: CTA CHEST FINDINGS Cardiovascular: The heart is normal in size. No pericardial effusion. The aorta is normal in caliber. No dissection or atherosclerotic calcifications. Few scattered coronary artery calcifications are noted. Right-sided Port-A-Cath in good position with its tip near the cavoatrial junction. The pulmonary arterial tree is well opacified. There are small bilateral pulmonary emboli in the lower lobes. No large central pulmonary emboli. Mediastinum/Nodes: No mediastinal or hilar mass or lymphadenopathy. The mediastinal and hilar adenopathy seen on the prior study has resolved. Mildly dilated esophagus is noted. Lungs/Pleura: Patchy areas of subsegmental atelectasis and scarring  changes. No worrisome pulmonary lesions to suggest pulmonary metastatic disease. Musculoskeletal: Numerous bilateral metastatic rib lesions but these are much improved when compared to the prior examination. The large soft tissue mass is associated with metastasis have resolved and the lesions appear healed or healing. I do not see any new destructive bony changes. The left medial clavicle region also appears improved. The proximal sternal lesion also demonstrates some healing changes. Extensive metastatic disease involving the thoracic spine also with some interval improvement/healing. No new lesions. Pathologic compression fractures are noted in the midthoracic spine. These are new since the prior study. This mainly involves the T8 and T9 vertebral bodies. There is significant disease in these vertebral bodies on the prior study. 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 Review of the MIP images confirms the above findings. CT ABDOMEN and PELVIS FINDINGS Hepatobiliary: No focal hepatic lesions to suggest recurrent metastatic disease. The numerous small hepatic lesions have resolved. The gallbladder is normal. No common bile duct dilatation. Pancreas: No mass, inflammation or ductal dilatation. Spleen: Normal size.  No focal lesions. Adrenals/Urinary Tract: The adrenal glands and kidneys are unremarkable and stable. Small bilateral renal cysts. The neobladder is moderately distended. Stomach/Bowel: The stomach, duodenum, small bowel and colon are grossly normal. No acute inflammatory changes, mass lesions or obstructive findings. Vascular/Lymphatic: The aorta and branch vessels are normal. The major venous structures are patent. No mesenteric or retroperitoneal mass or adenopathy. Reproductive: Status post prostatectomy and cystectomy with neobladder. Other: No free pelvic fluid collections or pelvic abscess. No inguinal adenopathy. Musculoskeletal: Improved appearance of the large pelvic metastatic  lesions.  The soft tissue masses are smaller and the bone lesions demonstrate some healing changes. Review of the MIP images confirms the above findings. IMPRESSION: 1. Small bilateral pulmonary emboli. 2. Much improved CT appearance of the chest overall. The mediastinal and hilar adenopathy has resolved and the metastatic rib, sternal and left clavicle lesions appear improved. 3. Persistent significant metastatic disease involving the thoracic spine but there is also evidence of some improvement and no progressive findings. New pathologic compression fractures of T8 and T9. No spinal canal compromise. 4. Resolution of hepatic metastatic disease. 5. Persistent lumbar spine and pelvic metastatic bone lesions but some of the show slight improvement with less significant soft tissue components and some bony healing changes. 6. Status post cysto prostatectomy with neobladder. No complicating features. No abdominal or pelvic lymphadenopathy. Electronically Signed   By: Marijo Sanes M.D.   On: 10/11/2018 19:44   Ct Abdomen Pelvis W Contrast  Result Date: 10/11/2018 CLINICAL DATA:  Severe chest pain. History of metastatic prostate cancer. EXAM: CT ANGIOGRAPHY CHEST CT ABDOMEN AND PELVIS WITH CONTRAST TECHNIQUE: Multidetector CT imaging of the chest was performed using the standard protocol during bolus administration of intravenous contrast. Multiplanar CT image reconstructions and MIPs were obtained to evaluate the vascular anatomy. Multidetector CT imaging of the abdomen and pelvis was performed using the standard protocol during bolus administration of intravenous contrast. CONTRAST:  145mL OMNIPAQUE IOHEXOL 350 MG/ML SOLN COMPARISON:  Chest CT 08/02/2018 and CT abdomen 07/20/2018 FINDINGS: CTA CHEST FINDINGS Cardiovascular: The heart is normal in size. No pericardial effusion. The aorta is normal in caliber. No dissection or atherosclerotic calcifications. Few scattered coronary artery calcifications are noted. Right-sided  Port-A-Cath in good position with its tip near the cavoatrial junction. The pulmonary arterial tree is well opacified. There are small bilateral pulmonary emboli in the lower lobes. No large central pulmonary emboli. Mediastinum/Nodes: No mediastinal or hilar mass or lymphadenopathy. The mediastinal and hilar adenopathy seen on the prior study has resolved. Mildly dilated esophagus is noted. Lungs/Pleura: Patchy areas of subsegmental atelectasis and scarring changes. No worrisome pulmonary lesions to suggest pulmonary metastatic disease. Musculoskeletal: Numerous bilateral metastatic rib lesions but these are much improved when compared to the prior examination. The large soft tissue mass is associated with metastasis have resolved and the lesions appear healed or healing. I do not see any new destructive bony changes. The left medial clavicle region also appears improved. The proximal sternal lesion also demonstrates some healing changes. Extensive metastatic disease involving the thoracic spine also with some interval improvement/healing. No new lesions. Pathologic compression fractures are noted in the midthoracic spine. These are new since the prior study. This mainly involves the T8 and T9 vertebral bodies. There is significant disease in these vertebral bodies on the prior study. 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 Review of the MIP images confirms the above findings. CT ABDOMEN and PELVIS FINDINGS Hepatobiliary: No focal hepatic lesions to suggest recurrent metastatic disease. The numerous small hepatic lesions have resolved. The gallbladder is normal. No common bile duct dilatation. Pancreas: No mass, inflammation or ductal dilatation. Spleen: Normal size.  No focal lesions. Adrenals/Urinary Tract: The adrenal glands and kidneys are unremarkable and stable. Small bilateral renal cysts. The neobladder is moderately distended. Stomach/Bowel: The stomach, duodenum, small bowel and colon are grossly  normal. No acute inflammatory changes, mass lesions or obstructive findings. Vascular/Lymphatic: The aorta and branch vessels are normal. The major venous structures are patent. No mesenteric or retroperitoneal mass or adenopathy.  Reproductive: Status post prostatectomy and cystectomy with neobladder. Other: No free pelvic fluid collections or pelvic abscess. No inguinal adenopathy. Musculoskeletal: Improved appearance of the large pelvic metastatic lesions. The soft tissue masses are smaller and the bone lesions demonstrate some healing changes. Review of the MIP images confirms the above findings. IMPRESSION: 1. Small bilateral pulmonary emboli. 2. Much improved CT appearance of the chest overall. The mediastinal and hilar adenopathy has resolved and the metastatic rib, sternal and left clavicle lesions appear improved. 3. Persistent significant metastatic disease involving the thoracic spine but there is also evidence of some improvement and no progressive findings. New pathologic compression fractures of T8 and T9. No spinal canal compromise. 4. Resolution of hepatic metastatic disease. 5. Persistent lumbar spine and pelvic metastatic bone lesions but some of the show slight improvement with less significant soft tissue components and some bony healing changes. 6. Status post cysto prostatectomy with neobladder. No complicating features. No abdominal or pelvic lymphadenopathy. Electronically Signed   By: Marijo Sanes M.D.   On: 10/11/2018 19:44   Vas [place] Lower Extremity Venous (dvt)  Result Date: 10/13/2018  Lower Venous Study Indications: Pulmonary embolism.  Risk Factors: Confirmed PE Cancer metastatic prostate and bladder. Comparison Study: No previous study available Performing Technologist: Toma Copier RVS  Examination Guidelines: A complete evaluation includes B-mode imaging, spectral Doppler, color Doppler, and power Doppler as needed of all accessible portions of each vessel. Bilateral testing  is considered an integral part of a complete examination. Limited examinations for reoccurring indications may be performed as noted.  +---------+---------------+---------+-----------+----------+-------------------+  RIGHT     Compressibility Phasicity Spontaneity Properties Thrombus Aging       +---------+---------------+---------+-----------+----------+-------------------+  CFV       Full            Yes       Yes                                         +---------+---------------+---------+-----------+----------+-------------------+  SFJ       Full                                                                  +---------+---------------+---------+-----------+----------+-------------------+  FV Prox   Full            Yes       Yes                                         +---------+---------------+---------+-----------+----------+-------------------+  FV Mid    Full            Yes                                                   +---------+---------------+---------+-----------+----------+-------------------+  FV Distal Partial         No        Yes  Distally at the                                                                  knee Acute           +---------+---------------+---------+-----------+----------+-------------------+  PFV       Partial         No        Yes                    Proximal at the                                                                  confluence with the                                                              common femoral vein                                                              but not including.                                                               Acute                +---------+---------------+---------+-----------+----------+-------------------+  POP       Full            Yes       Yes                                         +---------+---------------+---------+-----------+----------+-------------------+  PTV       Full                                                                   +---------+---------------+---------+-----------+----------+-------------------+  PERO      Full  Difficult to image                                                               due to shivering     +---------+---------------+---------+-----------+----------+-------------------+   Right Technical Findings: Technically difficult to image the calf veins due to shivering  +---------+---------------+---------+-----------+----------+-------------------+  LEFT      Compressibility Phasicity Spontaneity Properties Thrombus Aging       +---------+---------------+---------+-----------+----------+-------------------+  CFV       Partial         Yes       Yes                    AcuteAt the                                                                      saphenofemoral                                                                   junction and                                                                     distally             +---------+---------------+---------+-----------+----------+-------------------+  SFJ       Partial                                                               +---------+---------------+---------+-----------+----------+-------------------+  FV Prox   Full                                                                  +---------+---------------+---------+-----------+----------+-------------------+  FV Mid    Full                                                                  +---------+---------------+---------+-----------+----------+-------------------+  FV Distal Full                                                                  +---------+---------------+---------+-----------+----------+-------------------+  PFV       Partial                                          Acute                +---------+---------------+---------+-----------+----------+-------------------+  POP       Full             Yes       Yes                                         +---------+---------------+---------+-----------+----------+-------------------+  PTV       Full                                                                  +---------+---------------+---------+-----------+----------+-------------------+  PERO                                                       Unable to visualize                                                              due to shivering     +---------+---------------+---------+-----------+----------+-------------------+   Left Technical Findings: Technically difficult to image the calf veins due to shivering   Summary: Right: Findings consistent with acute deep vein thrombosis involving the right femoral vein, and right proximal profunda vein. No cystic structure found in the popliteal fossa. See technical findings listed above Left: Findings consistent with acute deep vein thrombosis involving the left common femoral vein, and left proximal profunda vein. No cystic structure found in the popliteal fossa. See technical findings listed above.  *See table(s) above for measurements and observations. Electronically signed by Curt Jews MD on 10/13/2018 at 9:49:47 AM.    Final

## 2018-10-21 NOTE — Telephone Encounter (Signed)
Received message from patient SO stating that the patient has been having nausea and vomiting since yesterday morning. She stated that she has been administering the Compazine every 6 hours and every time the patient drinks he vomits. She stated that he took a compazine first thing this morning and has still been vomiting. She also stated that the patient had a BM on Saturday and was also eating and drinking on Saturday. Called wendy back and explained that Sandi Mealy PA with [facility] can assess the patient today and that a scheduler will call with the appt times for today. [name] verbalized understanding.

## 2018-10-21 NOTE — Patient Instructions (Signed)

## 2018-10-21 NOTE — Progress Notes (Signed)
Pt states "I feel fine, I just came because my girlfriend wanted me to get checked out just in case" d/t recent, but currently controlled, N/V.  Denies any pain or N/V/D or CP/SOB out of baseline at this time.  PA Lucianne Lei took OVS. Sitting: 97%, 103/83, 108 HR Standing: 96%, 91/72, 140 HR  Pt received sublingual ativan and IVF NS, tolerated well.  Denies any N/V at end of infusion.  VSS.

## 2018-10-23 ENCOUNTER — Inpatient Hospital Stay: Payer: PRIVATE HEALTH INSURANCE

## 2018-10-23 ENCOUNTER — Inpatient Hospital Stay: Payer: PRIVATE HEALTH INSURANCE | Admitting: Nutrition

## 2018-10-23 ENCOUNTER — Other Ambulatory Visit: Payer: Self-pay | Admitting: Oncology

## 2018-10-23 ENCOUNTER — Other Ambulatory Visit: Payer: Self-pay

## 2018-10-23 VITALS — BP 112/83 | HR 111 | Temp 99.1°F | Resp 18

## 2018-10-23 DIAGNOSIS — Z5111 Encounter for antineoplastic chemotherapy: Secondary | ICD-10-CM | POA: Diagnosis not present

## 2018-10-23 DIAGNOSIS — C679 Malignant neoplasm of bladder, unspecified: Secondary | ICD-10-CM

## 2018-10-23 DIAGNOSIS — C801 Malignant (primary) neoplasm, unspecified: Secondary | ICD-10-CM

## 2018-10-23 DIAGNOSIS — C61 Malignant neoplasm of prostate: Secondary | ICD-10-CM

## 2018-10-23 DIAGNOSIS — Z95828 Presence of other vascular implants and grafts: Secondary | ICD-10-CM

## 2018-10-23 DIAGNOSIS — C7951 Secondary malignant neoplasm of bone: Secondary | ICD-10-CM

## 2018-10-23 LAB — CMP (CANCER CENTER ONLY)
ALT: 70 U/L — ABNORMAL HIGH (ref 0–44)
AST: 45 U/L — ABNORMAL HIGH (ref 15–41)
Albumin: 3.2 g/dL — ABNORMAL LOW (ref 3.5–5.0)
Alkaline Phosphatase: 167 U/L — ABNORMAL HIGH (ref 38–126)
Anion gap: 7 (ref 5–15)
BUN: 13 mg/dL (ref 6–20)
CO2: 21 mmol/L — ABNORMAL LOW (ref 22–32)
Calcium: 9.3 mg/dL (ref 8.9–10.3)
Chloride: 110 mmol/L (ref 98–111)
Creatinine: 0.66 mg/dL (ref 0.61–1.24)
GFR, Est AFR Am: >60 mL/min (ref >60)
GFR, Estimated: >60 mL/min (ref >60)
Glucose, Bld: 111 mg/dL — ABNORMAL HIGH (ref 70–99)
Potassium: 4.2 mmol/L (ref 3.5–5.1)
Sodium: 138 mmol/L (ref 135–145)
Total Bilirubin: <0.2 mg/dL — ABNORMAL LOW (ref 0.3–1.2)
Total Protein: 6.4 g/dL — ABNORMAL LOW (ref 6.5–8.1)

## 2018-10-23 LAB — CBC WITH DIFFERENTIAL (CANCER CENTER ONLY)
Abs Immature Granulocytes: 0.37 10*3/uL — ABNORMAL HIGH (ref 0.00–0.07)
Basophils Absolute: 0.1 10*3/uL (ref 0.0–0.1)
Basophils Relative: 1 %
Eosinophils Absolute: 0 10*3/uL (ref 0.0–0.5)
Eosinophils Relative: 0 %
HCT: 25.7 % — ABNORMAL LOW (ref 39.0–52.0)
Hemoglobin: 8.5 g/dL — ABNORMAL LOW (ref 13.0–17.0)
Immature Granulocytes: 8 %
Lymphocytes Relative: 11 %
Lymphs Abs: 0.5 10*3/uL — ABNORMAL LOW (ref 0.7–4.0)
MCH: 32.3 pg (ref 26.0–34.0)
MCHC: 33.1 g/dL (ref 30.0–36.0)
MCV: 97.7 fL (ref 80.0–100.0)
Monocytes Absolute: 0.4 10*3/uL (ref 0.1–1.0)
Monocytes Relative: 9 %
Neutro Abs: 3.4 10*3/uL (ref 1.7–7.7)
Neutrophils Relative %: 71 %
Platelet Count: 229 10*3/uL (ref 150–400)
RBC: 2.63 MIL/uL — ABNORMAL LOW (ref 4.22–5.81)
RDW: 17.2 % — ABNORMAL HIGH (ref 11.5–15.5)
WBC Count: 4.8 10*3/uL (ref 4.0–10.5)
nRBC: 1.3 % — ABNORMAL HIGH (ref 0.0–0.2)

## 2018-10-23 MED ORDER — SODIUM CHLORIDE 0.9% FLUSH
10.0000 mL | INTRAVENOUS | Status: DC | PRN
  Administered 2018-10-23: 17:00:00 10 mL
  Filled 2018-10-23: qty 10

## 2018-10-23 MED ORDER — PEGFILGRASTIM 6 MG/0.6ML ~~LOC~~ PSKT
6.0000 mg | PREFILLED_SYRINGE | Freq: Once | SUBCUTANEOUS | Status: AC
  Administered 2018-10-23: 6 mg via SUBCUTANEOUS

## 2018-10-23 MED ORDER — PROCHLORPERAZINE MALEATE 10 MG PO TABS
10.0000 mg | ORAL_TABLET | Freq: Once | ORAL | Status: AC
  Administered 2018-10-23: 10 mg via ORAL

## 2018-10-23 MED ORDER — PROCHLORPERAZINE MALEATE 10 MG PO TABS
ORAL_TABLET | ORAL | Status: AC
  Filled 2018-10-23: qty 1

## 2018-10-23 MED ORDER — SODIUM CHLORIDE 0.9% FLUSH
10.0000 mL | Freq: Once | INTRAVENOUS | Status: AC
  Administered 2018-10-23: 10 mL
  Filled 2018-10-23: qty 10

## 2018-10-23 MED ORDER — SODIUM CHLORIDE 0.9 % IV SOLN
Freq: Once | INTRAVENOUS | Status: AC
  Administered 2018-10-23: 15:00:00 via INTRAVENOUS
  Filled 2018-10-23: qty 250

## 2018-10-23 MED ORDER — FENTANYL 100 MCG/HR TD PT72: 1.0000 | MEDICATED_PATCH | TRANSDERMAL | 0 refills | Status: DC

## 2018-10-23 MED ORDER — SODIUM CHLORIDE 0.9 % IV SOLN
1000.0000 mg/m2 | Freq: Once | INTRAVENOUS | Status: AC
  Administered 2018-10-23: 1786 mg via INTRAVENOUS
  Filled 2018-10-23: qty 46.97

## 2018-10-23 MED ORDER — PEGFILGRASTIM 6 MG/0.6ML ~~LOC~~ PSKT
PREFILLED_SYRINGE | SUBCUTANEOUS | Status: AC
  Filled 2018-10-23: qty 0.6

## 2018-10-23 MED ORDER — HEPARIN SOD (PORK) LOCK FLUSH 100 UNIT/ML IV SOLN
500.0000 [IU] | Freq: Once | INTRAVENOUS | Status: AC | PRN
  Administered 2018-10-23: 17:00:00 500 [IU]
  Filled 2018-10-23: qty 5

## 2018-10-23 NOTE — Patient Instructions (Signed)
[facility] Discharge Instructions for Patients Receiving Chemotherapy  Today you received the following chemotherapy agents: Gemzar.  To help prevent nausea and vomiting after your treatment, we encourage you to take your nausea medication as directed.   If you develop nausea and vomiting that is not controlled by your nausea medication, call the clinic.   BELOW ARE SYMPTOMS THAT SHOULD BE REPORTED IMMEDIATELY:  *FEVER GREATER THAN 100.5 F  *CHILLS WITH OR WITHOUT FEVER  NAUSEA AND VOMITING THAT IS NOT CONTROLLED WITH YOUR NAUSEA MEDICATION  *UNUSUAL SHORTNESS OF BREATH  *UNUSUAL BRUISING OR BLEEDING  TENDERNESS IN MOUTH AND THROAT WITH OR WITHOUT PRESENCE OF ULCERS  *URINARY PROBLEMS  *BOWEL PROBLEMS  UNUSUAL RASH Items with * indicate a potential emergency and should be followed up as soon as possible.  Feel free to call the clinic should you have any questions or concerns. The clinic phone number is [phone].  Please show the [facility] at check-in to the Emergency Department and triage nurse.  Pegfilgrastim injection (onpro) What is this medicine? PEGFILGRASTIM (PEG fil gra stim) is a long-acting granulocyte colony-stimulating factor that stimulates the growth of neutrophils, a type of white blood cell important in the body's fight against infection. It is used to reduce the incidence of fever and infection in patients with certain types of cancer who are receiving chemotherapy that affects the bone marrow, and to increase survival after being exposed to high doses of radiation. This medicine may be used for other purposes; ask your health care provider or pharmacist if you have questions. COMMON BRAND NAME(S): Steve Rattler, Ziextenzo What should I tell my health care provider before I take this medicine? They need to know if you have any of these conditions:  kidney disease  latex allergy  ongoing radiation therapy  sickle  cell disease  skin reactions to acrylic adhesives (On-Body Injector only)  an unusual or allergic reaction to pegfilgrastim, filgrastim, other medicines, foods, dyes, or preservatives  pregnant or trying to get pregnant  breast-feeding How should I use this medicine? This medicine is for injection under the skin. If you get this medicine at home, you will be taught how to prepare and give the pre-filled syringe or how to use the On-body Injector. Refer to the patient Instructions for Use for detailed instructions. Use exactly as directed. Tell your healthcare provider immediately if you suspect that the On-body Injector may not have performed as intended or if you suspect the use of the On-body Injector resulted in a missed or partial dose. It is important that you put your used needles and syringes in a special sharps container. Do not put them in a trash can. If you do not have a sharps container, call your pharmacist or healthcare provider to get one. Talk to your pediatrician regarding the use of this medicine in children. While this drug may be prescribed for selected conditions, precautions do apply. Overdosage: If you think you have taken too much of this medicine contact a poison control center or emergency room at once. NOTE: This medicine is only for you. Do not share this medicine with others. What if I miss a dose? It is important not to miss your dose. Call your doctor or health care professional if you miss your dose. If you miss a dose due to an On-body Injector failure or leakage, a new dose should be administered as soon as possible using a single prefilled syringe for manual use. What may  interact with this medicine? Interactions have not been studied. Give your health care provider a list of all the medicines, herbs, non-prescription drugs, or dietary supplements you use. Also tell them if you smoke, drink alcohol, or use illegal drugs. Some items may interact with your  medicine. This list may not describe all possible interactions. Give your health care provider a list of all the medicines, herbs, non-prescription drugs, or dietary supplements you use. Also tell them if you smoke, drink alcohol, or use illegal drugs. Some items may interact with your medicine. What should I watch for while using this medicine? You may need blood work done while you are taking this medicine. If you are going to need a MRI, CT scan, or other procedure, tell your doctor that you are using this medicine (On-Body Injector only). What side effects may I notice from receiving this medicine? Side effects that you should report to your doctor or health care professional as soon as possible:  allergic reactions like skin rash, itching or hives, swelling of the face, lips, or tongue  back pain  dizziness  fever  pain, redness, or irritation at site where injected  pinpoint red spots on the skin  red or dark-brown urine  shortness of breath or breathing problems  stomach or side pain, or pain at the shoulder  swelling  tiredness  trouble passing urine or change in the amount of urine Side effects that usually do not require medical attention (report to your doctor or health care professional if they continue or are bothersome):  bone pain  muscle pain This list may not describe all possible side effects. Call your doctor for medical advice about side effects. You may report side effects to FDA at 1-800-FDA-1088. Where should I keep my medicine? Keep out of the reach of children. If you are using this medicine at home, you will be instructed on how to store it. Throw away any unused medicine after the expiration date on the label. NOTE: This sheet is a summary. It may not cover all possible information. If you have questions about this medicine, talk to your doctor, pharmacist, or health care provider.  2020 Elsevier/Gold Standard (2017-04-09 16:57:08)

## 2018-10-23 NOTE — Progress Notes (Signed)
Brief nutrition follow-up completed with patient during infusion for stage IV bladder/prostate cancer. Weight improved and documented as 150.5 pounds September 30 improved from 145 pounds September 9. Patient continues to have intermittent nausea and vomiting and was hospitalized September 25 through September 27 for nausea, vomiting and chest pain. Patient reports he is trying to eat and drink whatever tastes good to him. Reports some family members and his fiance fuss at him for what he is choosing.  Nutrition diagnosis: Unintended weight loss improved.  Intervention: Patient was educated to continue strategies for smaller more frequent meals and snacks with adequate calories and protein. Reviewed strategies for minimizing nausea. Encourage patient to continue increased fluids.  Monitoring, evaluation, goals: Patient is tolerating adequate calories and protein had weight gain.  Next visit: Follow-up is not scheduled however patient has my contact information for questions or concerns.  **Disclaimer: This note was dictated with voice recognition software. Similar sounding words can inadvertently be transcribed and this note may contain transcription errors which may not have been corrected upon publication of note.**

## 2018-10-23 NOTE — Progress Notes (Signed)
Per Dr. [name] ok to treat patient today with HR >100.

## 2018-10-24 ENCOUNTER — Other Ambulatory Visit: Payer: Self-pay

## 2018-10-24 ENCOUNTER — Telehealth: Payer: Self-pay | Admitting: *Deleted

## 2018-10-24 ENCOUNTER — Other Ambulatory Visit: Payer: Self-pay | Admitting: Oncology

## 2018-10-24 MED ORDER — PROCHLORPERAZINE MALEATE 10 MG PO TABS: ORAL_TABLET | ORAL | 0 refills | Status: DC

## 2018-10-24 NOTE — Telephone Encounter (Signed)
See collaborative nurse refill medication entry note. Return call cancelled for voicemail forwarded to Triage.  "Lynett Fish calling for patient Rodney [name].  Had chemotherapy yesterday.  Cannot stop throwing up despite using nausea pills.  Call [phone]. "

## 2018-10-24 NOTE — Telephone Encounter (Signed)
Received call from patient SO stating that since patient got home yesterday after treatment he has been vomiting with all intake. She stated that he has been trying to drink milk and cannot tolerate it. She has been rotating the compazine and ativan. Encouraged her to only provide clear liquids like ginger ale, and clear broth. She stated that he is currently drinking sips of Gatorade and tolerating it. Explained that if the patients vomiting does not improve she can take him to the ED if necessary otherwise to follow up tomorrow if there are any continued concerns. She also stated that the patient needs a refill of the compazine. The refill was sent in and [name] was made aware. [name] verbalized understanding and had no other questions or concerns.

## 2018-11-04 ENCOUNTER — Other Ambulatory Visit: Payer: Self-pay | Admitting: Oncology

## 2018-11-06 ENCOUNTER — Other Ambulatory Visit: Payer: Self-pay

## 2018-11-06 ENCOUNTER — Inpatient Hospital Stay (HOSPITAL_BASED_OUTPATIENT_CLINIC_OR_DEPARTMENT_OTHER): Payer: PRIVATE HEALTH INSURANCE | Admitting: Oncology

## 2018-11-06 ENCOUNTER — Inpatient Hospital Stay: Payer: PRIVATE HEALTH INSURANCE

## 2018-11-06 ENCOUNTER — Other Ambulatory Visit: Payer: Self-pay | Admitting: Oncology

## 2018-11-06 VITALS — HR 108

## 2018-11-06 VITALS — BP 114/82 | HR 107 | Temp 98.3°F | Resp 18 | Wt 148.3 lb

## 2018-11-06 DIAGNOSIS — C679 Malignant neoplasm of bladder, unspecified: Secondary | ICD-10-CM

## 2018-11-06 DIAGNOSIS — C7951 Secondary malignant neoplasm of bone: Secondary | ICD-10-CM | POA: Diagnosis not present

## 2018-11-06 DIAGNOSIS — Z5111 Encounter for antineoplastic chemotherapy: Secondary | ICD-10-CM | POA: Diagnosis not present

## 2018-11-06 DIAGNOSIS — C801 Malignant (primary) neoplasm, unspecified: Secondary | ICD-10-CM | POA: Diagnosis not present

## 2018-11-06 DIAGNOSIS — C61 Malignant neoplasm of prostate: Secondary | ICD-10-CM

## 2018-11-06 LAB — CBC WITH DIFFERENTIAL (CANCER CENTER ONLY)
Abs Immature Granulocytes: 4.7 10*3/uL — ABNORMAL HIGH (ref 0.00–0.07)
Basophils Absolute: 0.1 10*3/uL (ref 0.0–0.1)
Basophils Relative: 0 %
Eosinophils Absolute: 0 10*3/uL (ref 0.0–0.5)
Eosinophils Relative: 0 %
HCT: 23.6 % — ABNORMAL LOW (ref 39.0–52.0)
Hemoglobin: 7.6 g/dL — ABNORMAL LOW (ref 13.0–17.0)
Immature Granulocytes: 17 %
Lymphocytes Relative: 5 %
Lymphs Abs: 1.3 10*3/uL (ref 0.7–4.0)
MCH: 32.3 pg (ref 26.0–34.0)
MCHC: 32.2 g/dL (ref 30.0–36.0)
MCV: 100.4 fL — ABNORMAL HIGH (ref 80.0–100.0)
Monocytes Absolute: 3.6 10*3/uL — ABNORMAL HIGH (ref 0.1–1.0)
Monocytes Relative: 13 %
Neutro Abs: 17.5 10*3/uL — ABNORMAL HIGH (ref 1.7–7.7)
Neutrophils Relative %: 65 %
Platelet Count: 372 10*3/uL (ref 150–400)
RBC: 2.35 MIL/uL — ABNORMAL LOW (ref 4.22–5.81)
RDW: 19.4 % — ABNORMAL HIGH (ref 11.5–15.5)
WBC Count: 27.2 10*3/uL — ABNORMAL HIGH (ref 4.0–10.5)
nRBC: 1.6 % — ABNORMAL HIGH (ref 0.0–0.2)

## 2018-11-06 LAB — CMP (CANCER CENTER ONLY)
ALT: 24 U/L (ref 0–44)
AST: 26 U/L (ref 15–41)
Albumin: 3 g/dL — ABNORMAL LOW (ref 3.5–5.0)
Alkaline Phosphatase: 267 U/L — ABNORMAL HIGH (ref 38–126)
Anion gap: 13 (ref 5–15)
BUN: 13 mg/dL (ref 6–20)
CO2: 21 mmol/L — ABNORMAL LOW (ref 22–32)
Calcium: 9.5 mg/dL (ref 8.9–10.3)
Chloride: 104 mmol/L (ref 98–111)
Creatinine: 0.81 mg/dL (ref 0.61–1.24)
GFR, Est AFR Am: >60 mL/min (ref >60)
GFR, Estimated: >60 mL/min (ref >60)
Glucose, Bld: 130 mg/dL — ABNORMAL HIGH (ref 70–99)
Potassium: 4.3 mmol/L (ref 3.5–5.1)
Sodium: 138 mmol/L (ref 135–145)
Total Bilirubin: <0.2 mg/dL — ABNORMAL LOW (ref 0.3–1.2)
Total Protein: 6.3 g/dL — ABNORMAL LOW (ref 6.5–8.1)

## 2018-11-06 MED ORDER — SODIUM CHLORIDE 0.9% FLUSH
10.0000 mL | INTRAVENOUS | Status: DC | PRN
  Administered 2018-11-06: 10 mL
  Filled 2018-11-06: qty 10

## 2018-11-06 MED ORDER — DRONABINOL 2.5 MG PO CAPS: 2.5000 mg | ORAL_CAPSULE | Freq: Two times a day (BID) | ORAL | 0 refills | Status: DC

## 2018-11-06 MED ORDER — SODIUM CHLORIDE 0.9 % IV SOLN
Freq: Once | INTRAVENOUS | Status: AC
  Administered 2018-11-06: 13:00:00 via INTRAVENOUS
  Filled 2018-11-06: qty 5

## 2018-11-06 MED ORDER — SODIUM CHLORIDE 0.9 % IV SOLN
1000.0000 mg/m2 | Freq: Once | INTRAVENOUS | Status: AC
  Administered 2018-11-06: 1786 mg via INTRAVENOUS
  Filled 2018-11-06: qty 46.97

## 2018-11-06 MED ORDER — HYDROMORPHONE HCL 2 MG PO TABS: 4.0000 mg | ORAL_TABLET | ORAL | 0 refills | Status: DC | PRN

## 2018-11-06 MED ORDER — SODIUM CHLORIDE 0.9 % IV SOLN
632.5000 mg | Freq: Once | INTRAVENOUS | Status: AC
  Administered 2018-11-06: 630 mg via INTRAVENOUS
  Filled 2018-11-06: qty 63

## 2018-11-06 MED ORDER — PALONOSETRON HCL INJECTION 0.25 MG/5ML
0.2500 mg | Freq: Once | INTRAVENOUS | Status: AC
  Administered 2018-11-06: 0.25 mg via INTRAVENOUS

## 2018-11-06 MED ORDER — SODIUM CHLORIDE 0.9 % IV SOLN
Freq: Once | INTRAVENOUS | Status: AC
  Administered 2018-11-06: 13:00:00 via INTRAVENOUS
  Filled 2018-11-06: qty 250

## 2018-11-06 MED ORDER — HEPARIN SOD (PORK) LOCK FLUSH 100 UNIT/ML IV SOLN
500.0000 [IU] | Freq: Once | INTRAVENOUS | Status: AC | PRN
  Administered 2018-11-06: 500 [IU]
  Filled 2018-11-06: qty 5

## 2018-11-06 MED ORDER — PALONOSETRON HCL INJECTION 0.25 MG/5ML
INTRAVENOUS | Status: AC
  Filled 2018-11-06: qty 5

## 2018-11-06 NOTE — Patient Instructions (Signed)
[facility] Discharge Instructions for Patients Receiving Chemotherapy  Today you received the following chemotherapy agents: Gemzar/Carboplatin.  To help prevent nausea and vomiting after your treatment, we encourage you to take your nausea medication as directed.   If you develop nausea and vomiting that is not controlled by your nausea medication, call the clinic.   BELOW ARE SYMPTOMS THAT SHOULD BE REPORTED IMMEDIATELY:  *FEVER GREATER THAN 100.5 F  *CHILLS WITH OR WITHOUT FEVER  NAUSEA AND VOMITING THAT IS NOT CONTROLLED WITH YOUR NAUSEA MEDICATION  *UNUSUAL SHORTNESS OF BREATH  *UNUSUAL BRUISING OR BLEEDING  TENDERNESS IN MOUTH AND THROAT WITH OR WITHOUT PRESENCE OF ULCERS  *URINARY PROBLEMS  *BOWEL PROBLEMS  UNUSUAL RASH Items with * indicate a potential emergency and should be followed up as soon as possible.  Feel free to call the clinic should you have any questions or concerns. The clinic phone number is [phone].  Please show the CHEMO ALERT CARD at check-in to the Emergency Department and triage nurse.   

## 2018-11-06 NOTE — Patient Instructions (Signed)

## 2018-11-06 NOTE — Progress Notes (Signed)
Hematology and Oncology Follow Up Visit  NEEMA NAIR ID:[id number] 1970/02/04 48 y.o. 11/06/2018 11:57 AM [name], [name], MDBurdine, [name], MD   Principle Diagnosis: 48 year old man with:  1.  Stage IV bladder cancerwith lung, bone and liver involvement diagnosed in July 2020.  2.  Prostate cancer diagnosed in 2019.  He is status post prostatectomy for Gleason score 6 disease in 2019.  Prior Therapy:  He is status post a robotic assisted laparoscopic radical cystoprostatectomy and bilateral pelvic lymphadenectomy and neobladder creation completed on November 08, 2017 by Dr. Alinda Money.  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  Prostate adenocarcinoma is 3+4 = 7 with 0 out of 14 lymph nodes noted.  He is status post radiation to the cervical and thoracic spine for a total of 30 Gy in 10 fractions completed on August 06, 2018.  Current therapy: Gemcitabine and cisplatin cycle 1 started on 08/13/2018.  He is currently receiving carboplatin and gemcitabine and here for cycle 5 of therapy (counting the initial cycle of cisplatin and gemcitabine)  Interim History: Mr. Ely is here for return evaluation.  Since the last visit, he reports feeling reasonably fair without any new complications.  He tolerated the last cycle of chemotherapy without any major issues.  He did report one episode of nausea vomiting and has resolved at this time.  He denies any recent hospitalizations or illnesses.  He is reporting a slightly increased pain in his ribs bilaterally especially when standing up.  He continues to be on fentanyl and Dilaudid with reasonable control of his pain.   Patient denied any alteration mental status, neuropathy, confusion or dizziness.  Denies any headaches or lethargy.  Denies any night sweats, weight loss or changes in appetite.  Denied orthopnea, dyspnea on exertion or chest discomfort.  Denies shortness of breath, difficulty breathing hemoptysis or cough.   Denies any abdominal distention, nausea, early satiety or dyspepsia.  Denies any hematuria, frequency, dysuria or nocturia.  Denies any skin irritation, dryness or rash.  Denies any ecchymosis or petechiae.  Denies any lymphadenopathy or clotting.  Denies any heat or cold intolerance.  Denies any anxiety or depression.  Remaining review of system is negative.                       Medications: Updated on review. Current Outpatient Medications  Medication Sig Dispense Refill  . cyanocobalamin (,VITAMIN B-12,) 1000 MCG/ML injection Inject 1,000 mcg into the muscle every 30 (thirty) days.    [name] dexamethasone (DECADRON) 2 MG tablet Take every other day for 1 week then stop. 20 tablet 0  . dronabinol (MARINOL) 2.5 MG capsule TAKE ONE CAPSULE BY MOUTH TWICE DAILY BEFORE LUNCH AND SUPPER 14 capsule 0  . DULoxetine (CYMBALTA) 30 MG capsule Take 1 capsule (30 mg total) by mouth daily. 90 capsule 3  . enoxaparin (LOVENOX) 60 MG/0.6ML injection Inject 0.6 mLs (60 mg total) into the skin every 12 (twelve) hours. 36 mL 2  . fentaNYL (DURAGESIC) 100 MCG/HR Place 1 patch onto the skin every 3 (three) days. 10 patch 0  . HYDROmorphone (DILAUDID) 2 MG tablet Take 2 tablets (4 mg total) by mouth every 4 (four) hours as needed for severe pain. 90 tablet 0  . lidocaine-prilocaine (EMLA) cream Apply 1 application topically as needed. 30 g 0  . LORazepam (ATIVAN) 0.5 MG tablet Place 1 tablet (0.5 mg total) under the tongue every 8 (eight) hours as needed (nausea).  35 tablet 0  . magic mouthwash w/lidocaine SOLN Take 15 mLs by mouth 4 (four) times daily. 400 mL 0  . Multiple Vitamin (MULTIVITAMIN WITH MINERALS) TABS tablet Take 1 tablet by mouth daily.    . pantoprazole (PROTONIX) 40 MG tablet TAKE 1 TABLET BY MOUTH EVERY DAY 30 tablet 0  . polyethylene glycol (MIRALAX / GLYCOLAX) 17 g packet Take 17 g by mouth 2 (two) times daily. (Patient taking differently: Take 17 g by mouth daily as needed for  mild constipation. ) 14 each 0  . prochlorperazine (COMPAZINE) 10 MG tablet TAKE 1 TABLET BY MOUTH EVERY 6 HOURS AS NEEDED FOR NAUSEA AND VOMITING 30 tablet 0  . senna-docusate (SENOKOT-S) 8.6-50 MG tablet Take 2 tablets by mouth 2 (two) times daily. 60 tablet 0   No current facility-administered medications for this visit.      Allergies:  Allergies  Allergen Reactions  . Morphine And Related Itching  . Vicodin [Hydrocodone-Acetaminophen] Itching    Past Medical History, Surgical history, Social history, and Family History without any changes on review.    Physical Exam:   Blood pressure 114/82, pulse (!) 107, temperature 98.3 F (36.8 C), temperature source Temporal, resp. rate 18, weight 148 lb 4.8 oz (67.3 kg), SpO2 99 %.     ECOG: 2      General appearance: Comfortable appearing without any discomfort Head: Normocephalic without any trauma Oropharynx: Mucous membranes are moist and pink without any thrush or ulcers. Eyes: Pupils are equal and round reactive to light. Lymph nodes: No cervical, supraclavicular, inguinal or axillary lymphadenopathy.   Heart:regular rate and rhythm.  S1 and S2 without leg edema. Lung: Clear without any rhonchi or wheezes.  No dullness to percussion. Abdomin: Soft, nontender, nondistended with good bowel sounds.  No hepatosplenomegaly. Musculoskeletal: No joint deformity or effusion.  Full range of motion noted. Neurological: No deficits noted on motor, sensory and deep tendon reflex exam. Skin: No petechial rash or dryness.  Appeared moist.  Psychiatric: Mood and affect appeared appropriate.         Lab Results: Lab Results  Component Value Date   WBC 4.8 10/23/2018   HGB 8.5 (L) 10/23/2018   HCT 25.7 (L) 10/23/2018   MCV 97.7 10/23/2018   PLT 229 10/23/2018     Chemistry      Component Value Date/Time   NA 138 10/23/2018 1401   K 4.2 10/23/2018 1401   CL 110 10/23/2018 1401   CO2 21 (L) 10/23/2018 1401   BUN 13  10/23/2018 1401   CREATININE 0.66 10/23/2018 1401      Component Value Date/Time   CALCIUM 9.3 10/23/2018 1401   ALKPHOS 167 (H) 10/23/2018 1401   AST 45 (H) 10/23/2018 1401   ALT 70 (H) 10/23/2018 1401   BILITOT <0.2 (L) 10/23/2018 1401        Impression and Plan:   48 year old with:   1.  Stage IV bladder cancer with pulmonary, bone and hepatic involvement noted in July 2020.  He continues to tolerate chemotherapy and experiencing excellent clinical benefit at this time.  Imaging studies in September 2020 showed positive response to therapy.  The natural course of his disease as well as additional treatment options were reviewed as well as the role for continuing the current regimen.  After discussion today, we have opted to continue with the same dose and schedule complete 6 cycles of therapy.  Long-term complications including cytopenias and neuropathy as well as renal insufficiency were reiterated.  He is agreeable to continue.   2.  IV access:  Port-A-Cath continues to be in use without any issues.   3.  Antiemetics: No recent nausea or vomiting reported.  Compazine is available for him.   4.  Goals of care:  Therapy remains palliative although he is benefiting clinically and performance status is adequate plan is to continue the same dose of scheduled.   5.  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  Continues to be on fentanyl and Dilaudid.  6.  Weight loss: His appetite has remained reasonable although lost to couple pounds.  We will continue to emphasize the importance of nutritional supplements and risk of these issues moving forward.  7.  Deep vein thrombosis and pulmonary embolism: He is currently on Lovenox without any issues.  8.  Neutropenia: We will continue to receive Neulasta Onpro after each cycle of therapy given his risk of neutropenia.  9.  Follow-up: Will be in 1 week for day 8 of cycle 4 and 3 weeks for the start of cycle 6.    25 minutes was spent with the patient face-to-face today.  More than 50% of time was dedicated to reviewing his disease status, treatment options and addressing complications with therapy.    Zola Button, MD 10/21/202011:57 AM

## 2018-11-06 NOTE — Progress Notes (Signed)
OK to treat per Dr. [name]. W/ Hgb 7.6 and pulse 108

## 2018-11-07 ENCOUNTER — Other Ambulatory Visit: Payer: Self-pay | Admitting: Radiation Oncology

## 2018-11-07 ENCOUNTER — Ambulatory Visit
Admission: RE | Admit: 2018-11-07 | Discharge: 2018-11-07 | Disposition: A | Discharge status: Home / Self Care | Payer: PRIVATE HEALTH INSURANCE | Source: Ambulatory Visit | Attending: Radiation Oncology | Admitting: Radiation Oncology

## 2018-11-07 ENCOUNTER — Other Ambulatory Visit: Payer: Self-pay | Admitting: Interventional Radiology

## 2018-11-07 ENCOUNTER — Ambulatory Visit: Payer: PRIVATE HEALTH INSURANCE | Admitting: Radiation Oncology

## 2018-11-07 DIAGNOSIS — C689 Malignant neoplasm of urinary organ, unspecified: Secondary | ICD-10-CM

## 2018-11-07 DIAGNOSIS — S22060A Wedge compression fracture of T7-T8 vertebra, initial encounter for closed fracture: Secondary | ICD-10-CM

## 2018-11-07 DIAGNOSIS — C7951 Secondary malignant neoplasm of bone: Secondary | ICD-10-CM

## 2018-11-07 DIAGNOSIS — S22070A Wedge compression fracture of T9-T10 vertebra, initial encounter for closed fracture: Secondary | ICD-10-CM

## 2018-11-07 NOTE — Progress Notes (Signed)
Radiation Oncology         [phone] ________________________________  Outpatient Follow Up - Conducted via telephone due to current COVID-19 concerns for limiting patient exposure  I spoke with the patient to conduct this consult visit via telephone to spare the patient unnecessary potential exposure in the healthcare setting during the current COVID-19 pandemic. The patient was notified in advance and was offered a [facility] meeting to allow for face to face communication but unfortunately reported that they did not have the appropriate resources/technology to support such a visit and instead preferred to proceed with a telephone visit.   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  Narrative:  The patient was contacted today by phone to discuss options of further palliative radiotherapy to the spine. In summary he was recently diagnosed with metastatic urothelial carcinoma from the bladder to the bone involving the pelvis and thoracic and cervical spine as well as to the liver and lung. Mid treatment he did have pain in the left tibia and simulation scan showed a very small lytic lesion in the left tibia which was treated also. He received palliative radiotherapy to these sites in July 2020. During treatment he did very well with radiotherapy and did not have significant desquamation. He reports he has noticed pain improvement since radiotherapy ended. He is in the process of receiving palliative platin based chemotherapy with Dr.  [name] and began systemic gemzar/cisplatin on 08/13/2018. He has been doing pretty well but has some persistent back pain. He is contacted today to discuss options of further palliative radiotherapy. His most recent imaging from 10/11/2018 revealed new compression deformities in the T8 and T9 vertebral bodies, but his other sites of disease seem to have improved. He did have small bilateral PEs and was started on BID lovenox.  He is planning to complete this cycle of chemotherapy next week and hope to have another scan a few weeks later.   On review of systems, the patient reports that he is doing better with pain than previously but still has occasional left lower extremity pain. In the past few months as he's been tapering his pain medication, he has noticed progressive pain in the mid back centrally below the shoulder blades that radiates around the chest wall bilaterally. He states this is worse when trying to stand up and denies any sensory deficits along the chest wall. He denies any chest tightness, shortness of breath, cough, fevers, chills, night sweats, unintended weight changes. He denies any bowel or bladder disturbances, and denies abdominal pain, nausea or vomiting. He denies any new musculoskeletal or joint aches or pains, new skin lesions or concerns. A complete review of systems is obtained and is otherwise negative.  Past Medical History:  Past Medical History:  Diagnosis Date   Anxiety    Bladder tumor    Blood in urine    saw some 3 days ago but none inthe last 2 days    Bradycardia    Depression    Dizziness    historical  Elevated PSA    Family history of gastric cancer    Heartburn    occasional    Pain with urination    Prostate cancer ([place])    Urothelial cancer [facility])     Past Surgical History: Past Surgical History:  Procedure Laterality Date   BACK SURGERY  2012   3 disc; dr Carloyn Manner  did first and dr elsner did the last 2    CYSTOSCOPY W/ RETROGRADES  N/A 07/02/2017   Procedure: CYSTOSCOPY WITH RETROGRADE PYELOGRAM/EXAM UNDER ANESTHESIA;  Surgeon: Raynelle Bring, MD;  Location: [facility];  Service: Urology;  Laterality: N/A;   CYSTOSCOPY WITH BIOPSY N/A 08/09/2017   Procedure: CYSTOSCOPY WITH PROSTATE NEEDLE BIOPSY;  Surgeon: Raynelle Bring, MD;  Location: [facility];  Service: Urology;  Laterality: N/A;  GENERAL ANESTHESIA WITH PARALYSIS/ ONLY NEEDS 60 MIN FOR ALL PROCEDURES   IR IMAGING GUIDED PORT INSERTION  08/07/2018   LEG SURGERY  2012   4 metal plates in plates    TRANSRECTAL ULTRASOUND N/A 08/09/2017   Procedure: TRANSRECTAL ULTRASOUND;  Surgeon: Raynelle Bring, MD;  Location: [facility];  Service: Urology;  Laterality: N/A;  ONLY NEEDS 60 MIN FOR ALL PROCEDURES   TRANSURETHRAL RESECTION OF BLADDER TUMOR N/A 07/02/2017   Procedure: TRANSURETHRAL RESECTION OF BLADDER TUMOR (TURBT);  Surgeon: Raynelle Bring, MD;  Location: [facility];  Service: Urology;  Laterality: N/A;   TRANSURETHRAL RESECTION OF BLADDER TUMOR N/A 08/09/2017   Procedure: TRANSURETHRAL RESECTION OF BLADDER TUMOR (TURBT);  Surgeon: Raynelle Bring, MD;  Location: [facility];  Service: Urology;  Laterality: N/A;    Social History:  Social History   Socioeconomic History   Marital status: Significant Other    Spouse name: Not on file   Number of children: Not on file   Years of education: Not on file   Highest education level: Not on file  Occupational History   Not on file  Social Needs   Financial resource strain: Not on file   Food insecurity    Worry: Not on file    Inability: Not on file   Transportation needs    Medical: Not on file    Non-medical: Not on file  Tobacco Use   Smoking status: Former Smoker    Packs/day: 2.00    Years: 8.00    Pack years: 16.00    Types: Cigarettes    Quit date: 05/29/2016    Years since quitting: 2.4   Smokeless tobacco: Former Systems developer   Tobacco comment: [place] 2009  Substance and Sexual Activity   Alcohol use: Not  Currently   Drug use: Not Currently    Types: Marijuana    Comment: last use was 05-29-2017   Sexual activity: Yes  Lifestyle   Physical activity    Days per week: Not on file    Minutes per session: Not on file   Stress: Not on file  Relationships   Social connections    Talks on phone: Not on file    Gets together: Not on file    Attends religious service: Not on file    Active member of club or organization: Not on file    Attends meetings of clubs or organizations: Not on file    Relationship status: Not on file   Intimate partner violence    Fear of current or ex partner: Not on file    Emotionally abused: Not on file    Physically abused: Not on file    Forced sexual activity: Not  on file  Other Topics Concern   Not on file  Social History Narrative   Not on file  The patient is in a relationship and lives in [place].   Family History: Family History  Problem Relation Age of Onset   Heart attack Father    Hypertension Father    Diabetes Father    Heart failure Father    Hypertension Mother    Gastric cancer Sister 26       d. 67   Prostate cancer Brother 35   Melanoma Maternal Aunt    Stroke Maternal Uncle    Stroke Paternal Uncle    Melanoma Maternal Grandmother        dx under 43   Colon cancer Maternal Grandmother    Heart disease Maternal Grandfather    Heart attack Paternal Grandfather    Allergies:  Allergies  Allergen Reactions   Morphine And Related Itching   Vicodin [Hydrocodone-Acetaminophen] Itching   Medications:  Current Outpatient Medications  Medication Sig Dispense Refill   cyanocobalamin (,VITAMIN B-12,) 1000 MCG/ML injection Inject 1,000 mcg into the muscle every 30 (thirty) days.     dexamethasone (DECADRON) 2 MG tablet Take every other day for 1 week then stop. 20 tablet 0   dronabinol (MARINOL) 2.5 MG capsule Take 1 capsule (2.5 mg total) by mouth 2 (two) times daily before lunch and supper. 30 capsule 0    DULoxetine (CYMBALTA) 30 MG capsule Take 1 capsule (30 mg total) by mouth daily. 90 capsule 3   enoxaparin (LOVENOX) 60 MG/0.6ML injection Inject 0.6 mLs (60 mg total) into the skin every 12 (twelve) hours. 36 mL 2   fentaNYL (DURAGESIC) 100 MCG/HR Place 1 patch onto the skin every 3 (three) days. 10 patch 0   HYDROmorphone (DILAUDID) 2 MG tablet Take 2 tablets (4 mg total) by mouth every 4 (four) hours as needed for severe pain. 90 tablet 0   lidocaine-prilocaine (EMLA) cream Apply 1 application topically as needed. 30 g 0   LORazepam (ATIVAN) 0.5 MG tablet Place 1 tablet (0.5 mg total) under the tongue every 8 (eight) hours as needed (nausea). 35 tablet 0   magic mouthwash w/lidocaine SOLN Take 15 mLs by mouth 4 (four) times daily. 400 mL 0   Multiple Vitamin (MULTIVITAMIN WITH MINERALS) TABS tablet Take 1 tablet by mouth daily.     pantoprazole (PROTONIX) 40 MG tablet TAKE 1 TABLET BY MOUTH EVERY DAY 30 tablet 0   polyethylene glycol (MIRALAX / GLYCOLAX) 17 g packet Take 17 g by mouth 2 (two) times daily. (Patient taking differently: Take 17 g by mouth daily as needed for mild constipation. ) 14 each 0   prochlorperazine (COMPAZINE) 10 MG tablet TAKE 1 TABLET BY MOUTH EVERY 6 HOURS AS NEEDED FOR NAUSEA AND VOMITING 30 tablet 0   senna-docusate (SENOKOT-S) 8.6-50 MG tablet Take 2 tablets by mouth 2 (two) times daily. 60 tablet 0   No current facility-administered medications for this encounter.    Physical Exam: Unable to assess due to encounter type.  Impression/Plan: 1. 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 The patient appears to be responding well to chemotherapy and has noticed improvement in pain in the sites we treated in the left leg, thoracic and cervical spine and left iliac wing. Dr. [name] discusses his symptoms and believes he would benefit from IR evaluation to consider vertebral augmentation. We will coordinate this referral as well and any  further imaging they need to determine if  he is a candidate. I will follow up with him once we know the next steps.  2. T8 and T9 compression fracture in previously treated bony disease from #1. See above. I will reach out to IR to see if he is a candidate for vertebral augmentation. We will follow up as well a few weeks from now to see how he is doing.  Given current concerns for patient exposure during the COVID-19 pandemic, this encounter was conducted via telephone.  The patient has given verbal consent for this type of encounter. The time spent during this encounter was 15 minutes and 50% of that time was spent in the coordination of his care. The attendants for this meeting included Dr. [name], Shona Simpson, [facility] and Kavin Leech  During the encounter, Dr. [name] and Shona Simpson [facility] were located at [facility] Radiation Oncology Department.  Kavin Leech  was located at home.    Carola Rhine, PAC

## 2018-11-08 ENCOUNTER — Telehealth: Payer: Self-pay | Admitting: Oncology

## 2018-11-08 NOTE — Telephone Encounter (Signed)
I talk with patient regarding schedule  

## 2018-11-11 ENCOUNTER — Encounter (HOSPITAL_COMMUNITY): Payer: Self-pay | Admitting: Emergency Medicine

## 2018-11-11 ENCOUNTER — Other Ambulatory Visit: Payer: Self-pay

## 2018-11-11 ENCOUNTER — Emergency Department (HOSPITAL_COMMUNITY)
Admission: EM | Admit: 2018-11-11 | Discharge: 2018-11-12 | Disposition: A | Discharge status: Home / Self Care | Payer: PRIVATE HEALTH INSURANCE | Attending: Emergency Medicine | Admitting: Emergency Medicine

## 2018-11-11 ENCOUNTER — Emergency Department (HOSPITAL_COMMUNITY): Payer: PRIVATE HEALTH INSURANCE

## 2018-11-11 DIAGNOSIS — Z8546 Personal history of malignant neoplasm of prostate: Secondary | ICD-10-CM | POA: Insufficient documentation

## 2018-11-11 DIAGNOSIS — Z8551 Personal history of malignant neoplasm of bladder: Secondary | ICD-10-CM | POA: Diagnosis not present

## 2018-11-11 DIAGNOSIS — Z9221 Personal history of antineoplastic chemotherapy: Secondary | ICD-10-CM | POA: Insufficient documentation

## 2018-11-11 DIAGNOSIS — Z87891 Personal history of nicotine dependence: Secondary | ICD-10-CM | POA: Diagnosis not present

## 2018-11-11 DIAGNOSIS — Z79899 Other long term (current) drug therapy: Secondary | ICD-10-CM | POA: Diagnosis not present

## 2018-11-11 DIAGNOSIS — R112 Nausea with vomiting, unspecified: Secondary | ICD-10-CM | POA: Diagnosis present

## 2018-11-11 LAB — COMPREHENSIVE METABOLIC PANEL
ALT: 69 U/L — ABNORMAL HIGH (ref 0–44)
AST: 70 U/L — ABNORMAL HIGH (ref 15–41)
Albumin: 3.6 g/dL (ref 3.5–5.0)
Alkaline Phosphatase: 169 U/L — ABNORMAL HIGH (ref 38–126)
Anion gap: 9 (ref 5–15)
BUN: 20 mg/dL (ref 6–20)
CO2: 24 mmol/L (ref 22–32)
Calcium: 9.7 mg/dL (ref 8.9–10.3)
Chloride: 103 mmol/L (ref 98–111)
Creatinine, Ser: 0.64 mg/dL (ref 0.61–1.24)
GFR calc Af Amer: >60 mL/min (ref >60)
GFR calc non Af Amer: >60 mL/min (ref >60)
Glucose, Bld: 109 mg/dL — ABNORMAL HIGH (ref 70–99)
Potassium: 4.5 mmol/L (ref 3.5–5.1)
Sodium: 136 mmol/L (ref 135–145)
Total Bilirubin: 0.5 mg/dL (ref 0.3–1.2)
Total Protein: 7 g/dL (ref 6.5–8.1)

## 2018-11-11 LAB — URINALYSIS, ROUTINE W REFLEX MICROSCOPIC
Bilirubin Urine: NEGATIVE
Glucose, UA: NEGATIVE mg/dL
Ketones, ur: NEGATIVE mg/dL
Nitrite: NEGATIVE
Protein, ur: 100 mg/dL — AB
Specific Gravity, Urine: 1.009 (ref 1.005–1.030)
WBC, UA: >50 WBC/hpf — ABNORMAL HIGH (ref 0–5)
pH: 7 (ref 5.0–8.0)

## 2018-11-11 LAB — CBC
HCT: 23 % — ABNORMAL LOW (ref 39.0–52.0)
Hemoglobin: 7.2 g/dL — ABNORMAL LOW (ref 13.0–17.0)
MCH: 32.4 pg (ref 26.0–34.0)
MCHC: 31.3 g/dL (ref 30.0–36.0)
MCV: 103.6 fL — ABNORMAL HIGH (ref 80.0–100.0)
Platelets: 304 10*3/uL (ref 150–400)
RBC: 2.22 MIL/uL — ABNORMAL LOW (ref 4.22–5.81)
RDW: 18.2 % — ABNORMAL HIGH (ref 11.5–15.5)
WBC: 4 10*3/uL (ref 4.0–10.5)
nRBC: 0 % (ref 0.0–0.2)

## 2018-11-11 LAB — LIPASE, BLOOD: Lipase: 23 U/L (ref 11–51)

## 2018-11-11 MED ORDER — PROMETHAZINE HCL 25 MG/ML IJ SOLN
25.0000 mg | Freq: Once | INTRAMUSCULAR | Status: AC
  Administered 2018-11-11: 22:00:00 25 mg via INTRAVENOUS
  Filled 2018-11-11: qty 1

## 2018-11-11 MED ORDER — PROMETHAZINE HCL 25 MG PO TABS: 25.0000 mg | ORAL_TABLET | Freq: Four times a day (QID) | ORAL | 0 refills | Status: DC | PRN

## 2018-11-11 MED ORDER — SODIUM CHLORIDE 0.9% FLUSH
3.0000 mL | Freq: Once | INTRAVENOUS | Status: AC
  Administered 2018-11-11: 22:00:00 3 mL via INTRAVENOUS

## 2018-11-11 NOTE — ED Notes (Signed)
Urine pulled from Condom Cath

## 2018-11-11 NOTE — ED Notes (Signed)
Pt provided with Ginger Ale for Po challenge.

## 2018-11-11 NOTE — ED Provider Notes (Signed)
[facility] DEPT Provider Note   CSN: [id number] Arrival date & time: 11/11/18  [id number]     History   Chief Complaint Chief Complaint  Patient presents with  . Emesis    HPI Rodney [name] is a 48 y.o. male.     48 y/o M with pmh bladder cancer depression, presenting to the emergency department for nausea vomiting.  Patient reports that this began yesterday.  Reports about 8 episodes of nonbloody vomiting.  Reports that he has nausea at baseline with chemotherapy that he gets every Wednesday but has never usually this bad.  He takes lorazepam as well as Compazine which have not been helping.  Denies any abdominal pain, diarrhea, fever, chills, chest pain, shortness of breath, URI symptoms.     Past Medical History:  Diagnosis Date  . Anxiety   . Bladder tumor   . Blood in urine    saw some 3 days ago but none inthe last 2 days   . Bradycardia   . Depression   . Dizziness    historical   . Elevated PSA   . Family history of gastric cancer   . Heartburn    occasional   . Pain with urination   . Prostate cancer ([place])   . 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 Pulmonary embolism ([place]) 10/11/2018  . Palliative care by specialist   . Hypokalemia 09/08/2018  . Hypophosphatemia 09/08/2018  . Acute metabolic encephalopathy 0000000  . Anemia associated with chemotherapy 09/08/2018  . Leukopenia due to antineoplastic chemotherapy ([place]) 09/08/2018  . Bacteremia 09/08/2018  . AKI (acute kidney injury) ([place])   . Failure to thrive in adult   . UTI (urinary tract infection) 09/07/2018  . ARF (acute renal failure) ([place]) 09/07/2018  . Dehydration 09/07/2018  . Hypernatremia 09/07/2018  . AMS (altered mental status) 09/07/2018  . FTT (failure to thrive) in adult 09/07/2018  . Metabolic acidosis [id number]  . Acute hypernatremia 09/07/2018  . Odynophagia 09/07/2018  . Oral thrush 09/07/2018  .  Port-A-Cath in place 08/13/2018  . 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 Pain   . Metastatic cancer ([place])   . Goals of care, counseling/discussion   . Intractable pain 07/20/2018  . Constipation 07/20/2018  . Acute lower UTI 07/20/2018  . Leukocytosis 07/20/2018  . Secondary malignancy of vertebral column ([place]) 07/16/2018  . 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 Bladder cancer ([place]) 11/08/2017  . Genetic testing 10/26/2017  . Family history of prostate cancer   . Family history of gastric cancer   . Family history of melanoma   . Family history of colon cancer   . Urothelial cancer ([place])   . Malignant neoplasm of prostate ([place]) 09/11/2017    Past Surgical History:  Procedure Laterality Date  . BACK SURGERY  2012   3 disc; dr Carloyn Manner  did first and dr elsner did the last 2   . CYSTOSCOPY W/ RETROGRADES N/A 07/02/2017   Procedure: CYSTOSCOPY WITH RETROGRADE PYELOGRAM/EXAM UNDER ANESTHESIA;  Surgeon: Raynelle Bring, MD;  Location: [facility] ORS;  Service: Urology;  Laterality: N/A;  . CYSTOSCOPY WITH BIOPSY N/A 08/09/2017   Procedure: CYSTOSCOPY WITH PROSTATE NEEDLE BIOPSY;  Surgeon: Raynelle Bring, MD;  Location: [facility];  Service: Urology;  Laterality: N/A;  GENERAL ANESTHESIA WITH PARALYSIS/ ONLY NEEDS 60 MIN FOR ALL PROCEDURES  . IR IMAGING GUIDED  PORT INSERTION  08/07/2018  . LEG SURGERY  2012   4 metal plates in plates   . TRANSRECTAL ULTRASOUND N/A 08/09/2017   Procedure: TRANSRECTAL ULTRASOUND;  Surgeon: Raynelle Bring, MD;  Location: [facility];  Service: Urology;  Laterality: N/A;  ONLY NEEDS 60 MIN FOR ALL PROCEDURES  . TRANSURETHRAL RESECTION OF BLADDER TUMOR N/A 07/02/2017   Procedure: TRANSURETHRAL RESECTION OF BLADDER TUMOR (TURBT);  Surgeon: Raynelle Bring, MD;  Location: [facility];  Service: Urology;  Laterality: N/A;  . TRANSURETHRAL RESECTION OF BLADDER TUMOR N/A 08/09/2017   Procedure: TRANSURETHRAL RESECTION OF  BLADDER TUMOR (TURBT);  Surgeon: Raynelle Bring, MD;  Location: [facility];  Service: Urology;  Laterality: N/A;        Home Medications    Prior to Admission medications   Medication Sig Start Date End Date Taking? Authorizing Provider  cyanocobalamin (,VITAMIN B-12,) 1000 MCG/ML injection Inject 1,000 mcg into the muscle every 30 (thirty) days.   Yes [provider]  dronabinol (MARINOL) 2.5 MG capsule Take 1 capsule (2.5 mg total) by mouth 2 (two) times daily before lunch and supper. 11/06/18  Yes [name], MD  DULoxetine (CYMBALTA) 30 MG capsule Take 1 capsule (30 mg total) by mouth daily. 10/16/18  Yes [name], MD  enoxaparin (LOVENOX) 60 MG/0.6ML injection Inject 0.6 mLs (60 mg total) into the skin every 12 (twelve) hours. 10/13/18 01/11/19 Yes Dessa Phi, DO  fentaNYL (DURAGESIC) 100 MCG/HR Place 1 patch onto the skin every 3 (three) days. 10/23/18  Yes [name], MD  HYDROmorphone (DILAUDID) 2 MG tablet Take 2 tablets (4 mg total) by mouth every 4 (four) hours as needed for severe pain. 11/06/18  Yes [name], MD  lidocaine-prilocaine (EMLA) cream Apply 1 application topically as needed. Patient taking differently: Apply 1 application topically as needed (access).  08/05/18  Yes [name], MD  LORazepam (ATIVAN) 0.5 MG tablet Place 1 tablet (0.5 mg total) under the tongue every 8 (eight) hours as needed (nausea). 10/21/18  Yes Harle Stanford., PA-C  magic mouthwash w/lidocaine SOLN Take 15 mLs by mouth 4 (four) times daily. 09/25/18  Yes [name], MD  Multiple Vitamin (MULTIVITAMIN WITH MINERALS) TABS tablet Take 1 tablet by mouth daily.   Yes [provider]  pantoprazole (PROTONIX) 40 MG tablet TAKE 1 TABLET BY MOUTH EVERY DAY Patient taking differently: Take 40 mg by mouth daily.  11/06/18  Yes [name], MD  polyethylene glycol (MIRALAX / GLYCOLAX) 17 g packet Take 17 g by mouth 2 (two) times daily. Patient taking  differently: Take 17 g by mouth daily as needed for mild constipation.  08/02/18  Yes Sheikh, Omair Latif, DO  prochlorperazine (COMPAZINE) 10 MG tablet TAKE 1 TABLET BY MOUTH EVERY 6 HOURS AS NEEDED FOR NAUSEA AND VOMITING Patient taking differently: Take 10 mg by mouth every 6 (six) hours as needed for nausea or vomiting.  10/24/18  Yes Shadad, Mathis Dad, MD  senna-docusate (SENOKOT-S) 8.6-50 MG tablet Take 2 tablets by mouth 2 (two) times daily. 08/02/18  Yes Sheikh, Omair Latif, DO  dexamethasone (DECADRON) 2 MG tablet Take every other day for 1 week then stop. Patient not taking: Reported on 11/11/2018 10/16/18   [name], MD  promethazine (PHENERGAN) 25 MG tablet Take 1 tablet (25 mg total) by mouth every 6 (six) hours as needed for nausea or vomiting. 11/11/18   Kristine Royal    Family History Family History  Problem Relation Age of Onset  . Heart attack Father   . Hypertension Father   . Diabetes Father   . Heart failure Father   . Hypertension Mother   . Gastric cancer Sister 45       d. 26  . Prostate cancer Brother 3  . Melanoma Maternal Aunt   . Stroke Maternal Uncle   . Stroke Paternal Uncle   . Melanoma Maternal Grandmother        dx under 26  . Colon cancer Maternal Grandmother   . Heart disease Maternal Grandfather   . Heart attack Paternal Grandfather     Social History Social History   Tobacco Use  . Smoking status: Former Smoker    Packs/day: 2.00    Years: 8.00    Pack years: 16.00    Types: Cigarettes    Quit date: 05/29/2016    Years since quitting: 2.4  . Smokeless tobacco: Former Systems developer  . Tobacco comment: quti 2009  Substance Use Topics  . Alcohol use: Not Currently  . Drug use: Not Currently    Types: Marijuana    Comment: last use was 05-29-2017     Allergies   Morphine and related and Vicodin [hydrocodone-acetaminophen]   Review of Systems Review of Systems  Constitutional: Negative for appetite change, chills and fever.   HENT: Negative for congestion and sore throat.   Respiratory: Negative for cough, choking, chest tightness and shortness of breath.   Cardiovascular: Negative for chest pain and palpitations.  Gastrointestinal: Positive for nausea and vomiting. Negative for abdominal pain, anal bleeding, blood in stool, constipation, diarrhea and rectal pain.  Genitourinary: Negative for dysuria.  Musculoskeletal: Negative for arthralgias, back pain and myalgias.  Skin: Negative for rash and wound.  Allergic/Immunologic: Positive for immunocompromised state.  Neurological: Negative for dizziness, light-headedness and headaches.  Hematological: Does not bruise/bleed easily.     Physical Exam Updated Vital Signs BP 111/79   Pulse 99   Temp 98.3 F (36.8 C) (Oral)   Resp 16   Wt 67.3 kg   SpO2 100%   BMI 19.04 kg/m   Physical Exam Vitals signs and nursing note reviewed.  Constitutional:      General: He is not in acute distress.    Appearance: Normal appearance. He is not toxic-appearing or diaphoretic.     Comments: Chronically ill appearing  HENT:     Head: Normocephalic and atraumatic.     Nose: Nose normal. No congestion.     Mouth/Throat:     Mouth: Mucous membranes are moist.  Eyes:     Conjunctiva/sclera: Conjunctivae normal.  Cardiovascular:     Rate and Rhythm: Normal rate and regular rhythm.     Pulses: Normal pulses.  Pulmonary:     Effort: Pulmonary effort is normal.  Abdominal:     General: Abdomen is flat. There is no distension.     Palpations: Abdomen is soft.     Tenderness: There is no abdominal tenderness. There is no guarding.  Skin:    General: Skin is dry.     Capillary Refill: Capillary refill takes less than 2 seconds.  Neurological:     General: No focal deficit present.     Mental Status: He is alert.  Psychiatric:        Mood and Affect: Mood normal.      ED Treatments / Results  Labs (all labs ordered are listed, but only abnormal results are  displayed) Labs Reviewed  COMPREHENSIVE METABOLIC  PANEL - Abnormal; Notable for the following components:      Result Value   Glucose, Bld 109 (*)    AST 70 (*)    ALT 69 (*)    Alkaline Phosphatase 169 (*)    All other components within normal limits  CBC - Abnormal; Notable for the following components:   RBC 2.22 (*)    Hemoglobin 7.2 (*)    HCT 23.0 (*)    MCV 103.6 (*)    RDW 18.2 (*)    All other components within normal limits  URINALYSIS, ROUTINE W REFLEX MICROSCOPIC - Abnormal; Notable for the following components:   APPearance CLOUDY (*)    Hgb urine dipstick SMALL (*)    Protein, ur 100 (*)    Leukocytes,Ua LARGE (*)    WBC, UA >50 (*)    Bacteria, UA FEW (*)    All other components within normal limits  URINE CULTURE  LIPASE, BLOOD    EKG None  Radiology Dg Abdomen 1 View  Result Date: 11/11/2018 CLINICAL DATA:  Nausea and vomiting. EXAM: ABDOMEN - 1 VIEW COMPARISON:  January 22, 2018 FINDINGS: The bowel gas pattern is nonobstructive and nonspecific. There is height loss of the T12, L1, and L2 vertebral bodies which is new from prior x-ray but likely similar to prior CT dated 10/11/2018 IMPRESSION: Nonobstructive bowel gas pattern. Electronically Signed   By: Constance Holster M.D.   On: 11/11/2018 21:40    Procedures Procedures (including critical care time)  Medications Ordered in ED Medications  sodium chloride flush (NS) 0.9 % injection 3 mL (3 mLs Intravenous Given 11/11/18 2207)  promethazine (PHENERGAN) injection 25 mg (25 mg Intravenous Given 11/11/18 2204)     Initial Impression / Assessment and Plan / ED Course  I have reviewed the triage vital signs and the nursing notes.  Pertinent labs & imaging results that were available during my care of the patient were reviewed by me and considered in my medical decision making (see chart for details).  Clinical Course as of Nov 10 2356  Mon Nov 11, 2018  2201 48 y/o male being treated with chemo  for bladder ca presents for n/v. No abdominal pain. Labs at baseline. Patient is getting fluids and phenergan. Abdominal plain film is negative for acute findings. Patient discussed with Dr. Tomi Bamberger and plan is to reassess after phenergan and PO trial.    [KM]  2320 Patient currently tolerating PO. Tachycardia resolved to 94. UA is showing >50 wbc, large leukocytes and few bacteria.  He is not having any dysuria or suprapubic pain. Will culture his urine for now. Plan to have patient complete the rest of his PO challenge and fluids and if no vomiting can send home with rx for phenergan. Patient and his wife are in agreement with the plan   [KM]    Clinical Course User Index [KM] Alveria Apley, PA-C       Based on review of vitals, medical screening exam, lab work and/or imaging, there does not appear to be an acute, emergent etiology for the patient's symptoms. Counseled pt on good return precautions and encouraged both PCP and ED follow-up as needed.  Prior to discharge, I also discussed incidental imaging findings with patient in detail and advised appropriate, recommended follow-up in detail.  Clinical Impression: 1. Non-intractable vomiting with nausea, unspecified vomiting type     Disposition: Discharge  Prior to providing a prescription for a controlled substance, I independently reviewed the patient's  recent prescription history on the [place] Controlled Substance Reporting System. The patient had no recent or regular prescriptions and was deemed appropriate for a brief, less than 3 day prescription of narcotic for acute analgesia.  This note was prepared with assistance of Systems analyst. Occasional wrong-word or sound-a-like substitutions may have occurred due to the inherent limitations of voice recognition software.   Final Clinical Impressions(s) / ED Diagnoses   Final diagnoses:  Non-intractable vomiting with nausea, unspecified vomiting type     ED Discharge Orders         Ordered    promethazine (PHENERGAN) 25 MG tablet  Every 6 hours PRN     11/11/18 2358           Kristine Royal 11/11/18 2358    Dorie Rank, MD 11/12/18 1725

## 2018-11-11 NOTE — ED Triage Notes (Signed)
Pt complaint of emesis x5 since yesterday; last chemo on Wednesday for bladder cancer.

## 2018-11-11 NOTE — Discharge Instructions (Signed)
You were seen in the ER today for nausea and vomiting. Your workup was reassuring. Since you are not having any urinary symptoms we are going to culture your urine and will call you if it grows any bacteria. We are giving you a prescription for another nausea medication that you can take. Make sure you are drinking planet of water. Thank you for allowing me to care for you today. Please return to the emergency department if you have new or worsening symptoms. Take your medications as instructed.

## 2018-11-12 LAB — URINE CULTURE

## 2018-11-12 MED ORDER — HEPARIN SOD (PORK) LOCK FLUSH 100 UNIT/ML IV SOLN
500.0000 [IU] | Freq: Once | INTRAVENOUS | Status: AC
  Administered 2018-11-12: 500 [IU]
  Filled 2018-11-12: qty 5

## 2018-11-13 ENCOUNTER — Other Ambulatory Visit: Payer: Self-pay

## 2018-11-13 ENCOUNTER — Inpatient Hospital Stay: Payer: PRIVATE HEALTH INSURANCE

## 2018-11-13 ENCOUNTER — Inpatient Hospital Stay: Payer: PRIVATE HEALTH INSURANCE | Admitting: Nutrition

## 2018-11-13 VITALS — BP 102/79 | HR 100 | Temp 98.3°F | Resp 16

## 2018-11-13 DIAGNOSIS — C7951 Secondary malignant neoplasm of bone: Secondary | ICD-10-CM

## 2018-11-13 DIAGNOSIS — C801 Malignant (primary) neoplasm, unspecified: Secondary | ICD-10-CM

## 2018-11-13 DIAGNOSIS — Z95828 Presence of other vascular implants and grafts: Secondary | ICD-10-CM

## 2018-11-13 DIAGNOSIS — Z5111 Encounter for antineoplastic chemotherapy: Secondary | ICD-10-CM | POA: Diagnosis not present

## 2018-11-13 DIAGNOSIS — C61 Malignant neoplasm of prostate: Secondary | ICD-10-CM

## 2018-11-13 DIAGNOSIS — C679 Malignant neoplasm of bladder, unspecified: Secondary | ICD-10-CM

## 2018-11-13 LAB — CBC WITH DIFFERENTIAL (CANCER CENTER ONLY)
Abs Immature Granulocytes: 0.04 10*3/uL (ref 0.00–0.07)
Basophils Absolute: 0 10*3/uL (ref 0.0–0.1)
Basophils Relative: 2 %
Eosinophils Absolute: 0 10*3/uL (ref 0.0–0.5)
Eosinophils Relative: 0 %
HCT: 19.9 % — ABNORMAL LOW (ref 39.0–52.0)
Hemoglobin: 6.6 g/dL — CL (ref 13.0–17.0)
Immature Granulocytes: 2 %
Lymphocytes Relative: 10 %
Lymphs Abs: 0.3 10*3/uL — ABNORMAL LOW (ref 0.7–4.0)
MCH: 32.7 pg (ref 26.0–34.0)
MCHC: 33.2 g/dL (ref 30.0–36.0)
MCV: 98.5 fL (ref 80.0–100.0)
Monocytes Absolute: 0.2 10*3/uL (ref 0.1–1.0)
Monocytes Relative: 8 %
Neutro Abs: 2.1 10*3/uL (ref 1.7–7.7)
Neutrophils Relative %: 78 %
Platelet Count: 207 10*3/uL (ref 150–400)
RBC: 2.02 MIL/uL — ABNORMAL LOW (ref 4.22–5.81)
RDW: 17.3 % — ABNORMAL HIGH (ref 11.5–15.5)
WBC Count: 2.7 10*3/uL — ABNORMAL LOW (ref 4.0–10.5)
nRBC: 0 % (ref 0.0–0.2)

## 2018-11-13 LAB — CMP (CANCER CENTER ONLY)
ALT: 61 U/L — ABNORMAL HIGH (ref 0–44)
AST: 51 U/L — ABNORMAL HIGH (ref 15–41)
Albumin: 3.1 g/dL — ABNORMAL LOW (ref 3.5–5.0)
Alkaline Phosphatase: 175 U/L — ABNORMAL HIGH (ref 38–126)
Anion gap: 11 (ref 5–15)
BUN: 24 mg/dL — ABNORMAL HIGH (ref 6–20)
CO2: 21 mmol/L — ABNORMAL LOW (ref 22–32)
Calcium: 9.8 mg/dL (ref 8.9–10.3)
Chloride: 105 mmol/L (ref 98–111)
Creatinine: 0.75 mg/dL (ref 0.61–1.24)
GFR, Est AFR Am: >60 mL/min (ref >60)
GFR, Estimated: >60 mL/min (ref >60)
Glucose, Bld: 102 mg/dL — ABNORMAL HIGH (ref 70–99)
Potassium: 4.3 mmol/L (ref 3.5–5.1)
Sodium: 137 mmol/L (ref 135–145)
Total Bilirubin: 0.3 mg/dL (ref 0.3–1.2)
Total Protein: 6.6 g/dL (ref 6.5–8.1)

## 2018-11-13 LAB — ABO/RH: ABO/RH(D): O POS

## 2018-11-13 MED ORDER — PEGFILGRASTIM 6 MG/0.6ML ~~LOC~~ PSKT
6.0000 mg | PREFILLED_SYRINGE | Freq: Once | SUBCUTANEOUS | Status: AC
  Administered 2018-11-13: 12:00:00 6 mg via SUBCUTANEOUS

## 2018-11-13 MED ORDER — SODIUM CHLORIDE 0.9% FLUSH
10.0000 mL | Freq: Once | INTRAVENOUS | Status: AC
  Administered 2018-11-13: 09:00:00 10 mL
  Filled 2018-11-13: qty 10

## 2018-11-13 MED ORDER — SODIUM CHLORIDE 0.9% FLUSH
10.0000 mL | INTRAVENOUS | Status: DC | PRN
  Administered 2018-11-13: 10 mL
  Filled 2018-11-13: qty 10

## 2018-11-13 MED ORDER — ONDANSETRON HCL 4 MG/2ML IJ SOLN
8.0000 mg | Freq: Once | INTRAMUSCULAR | Status: AC
  Administered 2018-11-13: 8 mg via INTRAVENOUS

## 2018-11-13 MED ORDER — SODIUM CHLORIDE 0.9% IV SOLUTION
250.0000 mL | Freq: Once | INTRAVENOUS | Status: AC
  Administered 2018-11-13: 12:00:00 250 mL via INTRAVENOUS
  Filled 2018-11-13: qty 250

## 2018-11-13 MED ORDER — PEGFILGRASTIM 6 MG/0.6ML ~~LOC~~ PSKT
PREFILLED_SYRINGE | SUBCUTANEOUS | Status: AC
  Filled 2018-11-13: qty 0.6

## 2018-11-13 MED ORDER — PROCHLORPERAZINE MALEATE 10 MG PO TABS: 10.0000 mg | ORAL_TABLET | Freq: Once | ORAL | Status: DC

## 2018-11-13 MED ORDER — SODIUM CHLORIDE 0.9 % IV SOLN
1000.0000 mg/m2 | Freq: Once | INTRAVENOUS | Status: AC
  Administered 2018-11-13: 11:00:00 1786 mg via INTRAVENOUS
  Filled 2018-11-13: qty 46.97

## 2018-11-13 MED ORDER — DEXAMETHASONE SODIUM PHOSPHATE 10 MG/ML IJ SOLN
INTRAMUSCULAR | Status: AC
  Filled 2018-11-13: qty 1

## 2018-11-13 MED ORDER — SODIUM CHLORIDE 0.9 % IV SOLN
Freq: Once | INTRAVENOUS | Status: AC
  Administered 2018-11-13: 10:00:00 via INTRAVENOUS
  Filled 2018-11-13: qty 250

## 2018-11-13 MED ORDER — ONDANSETRON HCL 4 MG/2ML IJ SOLN
INTRAMUSCULAR | Status: AC
  Filled 2018-11-13: qty 4

## 2018-11-13 MED ORDER — SODIUM CHLORIDE 0.9 % IV SOLN
Freq: Once | INTRAVENOUS | Status: AC
  Administered 2018-11-13: 14:00:00 via INTRAVENOUS
  Filled 2018-11-13: qty 250

## 2018-11-13 MED ORDER — DEXAMETHASONE SODIUM PHOSPHATE 10 MG/ML IJ SOLN
10.0000 mg | Freq: Once | INTRAMUSCULAR | Status: AC
  Administered 2018-11-13: 10:00:00 10 mg via INTRAVENOUS

## 2018-11-13 MED ORDER — HEPARIN SOD (PORK) LOCK FLUSH 100 UNIT/ML IV SOLN
500.0000 [IU] | Freq: Once | INTRAVENOUS | Status: AC | PRN
  Administered 2018-11-13: 16:00:00 500 [IU]
  Filled 2018-11-13: qty 5

## 2018-11-13 MED ORDER — DIPHENHYDRAMINE HCL 25 MG PO CAPS
25.0000 mg | ORAL_CAPSULE | Freq: Once | ORAL | Status: AC
  Administered 2018-11-13: 11:00:00 25 mg via ORAL

## 2018-11-13 MED ORDER — ACETAMINOPHEN 325 MG PO TABS
650.0000 mg | ORAL_TABLET | Freq: Once | ORAL | Status: AC
  Administered 2018-11-13: 11:00:00 650 mg via ORAL

## 2018-11-13 MED ORDER — PROCHLORPERAZINE MALEATE 10 MG PO TABS
ORAL_TABLET | ORAL | Status: AC
  Filled 2018-11-13: qty 1

## 2018-11-13 MED ORDER — ACETAMINOPHEN 325 MG PO TABS
ORAL_TABLET | ORAL | Status: AC
  Filled 2018-11-13: qty 2

## 2018-11-13 MED ORDER — DIPHENHYDRAMINE HCL 25 MG PO CAPS
ORAL_CAPSULE | ORAL | Status: AC
  Filled 2018-11-13: qty 1

## 2018-11-13 NOTE — Patient Instructions (Signed)

## 2018-11-13 NOTE — Progress Notes (Signed)
MD changed Gemzar premeds to [id number] for Day 8.  B. Corey Skains, PharmD, BCPS, BCOP

## 2018-11-13 NOTE — Progress Notes (Signed)
These preliminary result these preliminary results were noted.  Awaiting final report.

## 2018-11-13 NOTE — Progress Notes (Signed)
Dr. [name] made aware of hemoglobin result call from lab. Per Dr. [name] it is ok to treat today with hemoglobin of 6.6. Per Dr. [name] patient is to receive  2 units of PRBC's today.

## 2018-11-13 NOTE — Progress Notes (Signed)
Nutrition follow-up completed with patient during infusion for stage IV bladder/prostate cancer. Weight was documented as 148.3 pounds on October 26 down slightly from 150.5 pounds September 30. Patient reports he had increased nausea and vomiting over the weekend.  He went to the emergency department.  After IV fluids and Phenergan, he improved. He reports he is trying to eat and drink bland foods in small amounts.  Nutrition diagnosis: Unintended weight loss continues.  Intervention: Patient educated to continue small frequent meals and snacks consisting of bland foods.   Provided fact sheet on nausea and vomiting. Encouraged increased fluids as tolerated. Recommended patient take nausea medication as prescribed.  Monitoring, evaluation, goals: Patient will tolerate adequate calories and protein to promote weight stabilization.  Next visit: To be scheduled as needed.  Patient has my contact information for questions or concerns.  **Disclaimer: This note was dictated with voice recognition software. Similar sounding words can inadvertently be transcribed and this note may contain transcription errors which may not have been corrected upon publication of note.**

## 2018-11-13 NOTE — Patient Instructions (Addendum)
ONPRO will start infusing at 3:00pm Thursday Take ONPRO off at 4:00 PM Thursday evening  Blood Transfusion, Adult, Care After This sheet gives you information about how to care for yourself after your procedure. Your doctor may also give you more specific instructions. If you have problems or questions, contact your doctor. Follow these instructions at home:   Take over-the-counter and prescription medicines only as told by your doctor.  Go back to your normal activities as told by your doctor.  Follow instructions from your doctor about how to take care of the area where an IV tube was put into your vein (insertion site). Make sure you: ? Wash your hands with soap and water before you change your bandage (dressing). If there is no soap and water, use hand sanitizer. ? Change your bandage as told by your doctor.  Check your IV insertion site every day for signs of infection. Check for: ? More redness, swelling, or pain. ? More fluid or blood. ? Warmth. ? Pus or a bad smell. Contact a doctor if:  You have more redness, swelling, or pain around the IV insertion site.  You have more fluid or blood coming from the IV insertion site.  Your IV insertion site feels warm to the touch.  You have pus or a bad smell coming from the IV insertion site.  Your pee (urine) turns pink, red, or brown.  You feel weak after doing your normal activities. Get help right away if:  You have signs of a serious allergic or body defense (immune) system reaction, including: ? Itchiness. ? Hives. ? Trouble breathing. ? Anxiety. ? Pain in your chest or lower back. ? Fever, flushing, and chills. ? Fast pulse. ? Rash. ? Watery poop (diarrhea). ? Throwing up (vomiting). ? Dark pee. ? Serious headache. ? Dizziness. ? Stiff neck. ? Yellow color in your face or the white parts of your eyes (jaundice). Summary  After a blood transfusion, return to your normal activities as told by your doctor.   Every day, check for signs of infection where the IV tube was put into your vein.  Some signs of infection are warm skin, more redness and pain, more fluid or blood, and pus or a bad smell where the needle went in.  Contact your doctor if you feel weak or have any unusual symptoms. This information is not intended to replace advice given to you by your health care provider. Make sure you discuss any questions you have with your health care provider. Document Released: 01/23/2014 Document Revised: 05/09/2017 Document Reviewed: 08/27/2015 Elsevier Patient Education  2020 [place] Discharge Instructions for Patients Receiving Chemotherapy  Today you received the following chemotherapy agents: Gemzar.  To help prevent nausea and vomiting after your treatment, we encourage you to take your nausea medication as directed.   If you develop nausea and vomiting that is not controlled by your nausea medication, call the clinic.   BELOW ARE SYMPTOMS THAT SHOULD BE REPORTED IMMEDIATELY:  *FEVER GREATER THAN 100.5 F  *CHILLS WITH OR WITHOUT FEVER  NAUSEA AND VOMITING THAT IS NOT CONTROLLED WITH YOUR NAUSEA MEDICATION  *UNUSUAL SHORTNESS OF BREATH  *UNUSUAL BRUISING OR BLEEDING  TENDERNESS IN MOUTH AND THROAT WITH OR WITHOUT PRESENCE OF ULCERS  *URINARY PROBLEMS  *BOWEL PROBLEMS  UNUSUAL RASH Items with * indicate a potential emergency and should be followed up as soon as possible.  Feel free to call the clinic should you have any  questions or concerns. The clinic phone number is [phone].  Please show the [place] at check-in to the Emergency Department and triage nurse.  Pegfilgrastim injection (onpro) What is this medicine? PEGFILGRASTIM (PEG fil gra stim) is a long-acting granulocyte colony-stimulating factor that stimulates the growth of neutrophils, a type of white blood cell important in the body's fight against infection. It is  used to reduce the incidence of fever and infection in patients with certain types of cancer who are receiving chemotherapy that affects the bone marrow, and to increase survival after being exposed to high doses of radiation. This medicine may be used for other purposes; ask your health care provider or pharmacist if you have questions. COMMON BRAND NAME(S): Steve Rattler, Ziextenzo What should I tell my health care provider before I take this medicine? They need to know if you have any of these conditions:  kidney disease  latex allergy  ongoing radiation therapy  sickle cell disease  skin reactions to acrylic adhesives (On-Body Injector only)  an unusual or allergic reaction to pegfilgrastim, filgrastim, other medicines, foods, dyes, or preservatives  pregnant or trying to get pregnant  breast-feeding How should I use this medicine? This medicine is for injection under the skin. If you get this medicine at home, you will be taught how to prepare and give the pre-filled syringe or how to use the On-body Injector. Refer to the patient Instructions for Use for detailed instructions. Use exactly as directed. Tell your healthcare provider immediately if you suspect that the On-body Injector may not have performed as intended or if you suspect the use of the On-body Injector resulted in a missed or partial dose. It is important that you put your used needles and syringes in a special sharps container. Do not put them in a trash can. If you do not have a sharps container, call your pharmacist or healthcare provider to get one. Talk to your pediatrician regarding the use of this medicine in children. While this drug may be prescribed for selected conditions, precautions do apply. Overdosage: If you think you have taken too much of this medicine contact a poison control center or emergency room at once. NOTE: This medicine is only for you. Do not share this medicine with others.  What if I miss a dose? It is important not to miss your dose. Call your doctor or health care professional if you miss your dose. If you miss a dose due to an On-body Injector failure or leakage, a new dose should be administered as soon as possible using a single prefilled syringe for manual use. What may interact with this medicine? Interactions have not been studied. Give your health care provider a list of all the medicines, herbs, non-prescription drugs, or dietary supplements you use. Also tell them if you smoke, drink alcohol, or use illegal drugs. Some items may interact with your medicine. This list may not describe all possible interactions. Give your health care provider a list of all the medicines, herbs, non-prescription drugs, or dietary supplements you use. Also tell them if you smoke, drink alcohol, or use illegal drugs. Some items may interact with your medicine. What should I watch for while using this medicine? You may need blood work done while you are taking this medicine. If you are going to need a MRI, CT scan, or other procedure, tell your doctor that you are using this medicine (On-Body Injector only). What side effects may I notice from receiving this medicine?  Side effects that you should report to your doctor or health care professional as soon as possible:  allergic reactions like skin rash, itching or hives, swelling of the face, lips, or tongue  back pain  dizziness  fever  pain, redness, or irritation at site where injected  pinpoint red spots on the skin  red or dark-brown urine  shortness of breath or breathing problems  stomach or side pain, or pain at the shoulder  swelling  tiredness  trouble passing urine or change in the amount of urine Side effects that usually do not require medical attention (report to your doctor or health care professional if they continue or are bothersome):  bone pain  muscle pain This list may not describe all  possible side effects. Call your doctor for medical advice about side effects. You may report side effects to FDA at 1-800-FDA-1088. Where should I keep my medicine? Keep out of the reach of children. If you are using this medicine at home, you will be instructed on how to store it. Throw away any unused medicine after the expiration date on the label. NOTE: This sheet is a summary. It may not cover all possible information. If you have questions about this medicine, talk to your doctor, pharmacist, or health care provider.  2020 Elsevier/Gold Standard (2017-04-09 16:57:08)

## 2018-11-14 ENCOUNTER — Encounter: Payer: Self-pay | Admitting: *Deleted

## 2018-11-14 ENCOUNTER — Ambulatory Visit
Admission: RE | Admit: 2018-11-14 | Discharge: 2018-11-14 | Disposition: A | Discharge status: Home / Self Care | Payer: PRIVATE HEALTH INSURANCE | Source: Ambulatory Visit | Attending: Radiation Oncology | Admitting: Radiation Oncology

## 2018-11-14 DIAGNOSIS — S22060A Wedge compression fracture of T7-T8 vertebra, initial encounter for closed fracture: Secondary | ICD-10-CM

## 2018-11-14 DIAGNOSIS — S22070A Wedge compression fracture of T9-T10 vertebra, initial encounter for closed fracture: Secondary | ICD-10-CM

## 2018-11-14 HISTORY — PX: IR RADIOLOGIST EVAL & MGMT: IMG5224

## 2018-11-14 LAB — BPAM RBC
Blood Product Expiration Date: 202011302359
Blood Product Expiration Date: 202011302359
ISSUE DATE / TIME: 202010281204
ISSUE DATE / TIME: 202010281204
Unit Type and Rh: 5100
Unit Type and Rh: 5100

## 2018-11-14 LAB — TYPE AND SCREEN
ABO/RH(D): O POS
Antibody Screen: NEGATIVE
Unit division: 0
Unit division: 0

## 2018-11-14 NOTE — Consult Note (Signed)
Chief Complaint: Metastatic urothelial cancer, symptomatic thoracic pathologic fractures   Referring Physician(s): Hayden Pedro  History of Present Illness: Rodney [name] is a 48 y.o. male presenting today as a scheduled consultation for T8 and T9 pathologic fractures, kindly referred by Ms Shona Simpson, for evaluation of candidacy for vertebral augmentation.   Rodney [name] is here today with his wife for our consultation.    He tells me that he remembers the pain in his back starting around February of this year, which has progressed over the past few months.  When I asked him whether he has had palliative therapy to the spine, he remembers yes, and his wife remembers no, and he seems not to remember durable pain relief.  Clearly his record indicates that he has received therapy to the thoracic spine.    He feels that the pain has progressed more over the past few weeks to months, such that now he is significantly affected by the pain.  The pain is described to be in the mid thoracic spine, with some extension around the bilateral chest wall to the front of his chest.  The intensity is described as 8-9/10 intensity most of the days, with his pain medication only affecting the intensity slightly.  The quality is described as "cramping, stabbing" pain.  He is prescribed a fentanyl patch, and also takes po dilaudid prn.   He tells me that the pain has been waking him at night, and he is unable to perform his activities during the day comfortably.    He describes his typical day as spent at the house.  He gets up in the morning and dresses himself, but typically spends his time moving from the porch to the recliner/couch.  He is out of bed during the day.  He is not getting out much, for concern of his depressed immune system, and does not perform many chores around the house.   He denies any current symptoms of fever, rigors, chills.  He denies any symptoms of PNA or UTI.   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   His CT at the time shows [place] new pathologic compression fractures of T8 and T9.    Past Medical History:  Diagnosis Date   Anxiety    Bladder tumor    Blood in urine    saw some 3 days ago but none inthe last 2 days    Bradycardia    Depression    Dizziness    historical    Elevated PSA    Family history of gastric cancer    Heartburn    occasional    Pain with urination    Prostate cancer ([place])    Urothelial cancer [facility])     Past Surgical History:  Procedure Laterality Date   BACK SURGERY  2012   3 disc; dr Carloyn Manner  did first and dr elsner did the last 2    CYSTOSCOPY W/ RETROGRADES N/A 07/02/2017   Procedure: CYSTOSCOPY WITH RETROGRADE PYELOGRAM/EXAM UNDER ANESTHESIA;  Surgeon: Raynelle Bring, MD;  Location: [facility];  Service: Urology;  Laterality: N/A;   CYSTOSCOPY WITH BIOPSY N/A 08/09/2017   Procedure: CYSTOSCOPY WITH PROSTATE NEEDLE BIOPSY;  Surgeon: Raynelle Bring, MD;  Location: [facility];  Service: Urology;  Laterality: N/A;  GENERAL ANESTHESIA WITH PARALYSIS/ ONLY NEEDS 60 MIN FOR ALL PROCEDURES   IR IMAGING GUIDED PORT INSERTION  08/07/2018   IR RADIOLOGIST EVAL & MGMT  11/14/2018   LEG SURGERY  2012   4 metal plates in plates    TRANSRECTAL ULTRASOUND N/A 08/09/2017   Procedure: TRANSRECTAL ULTRASOUND;  Surgeon: Raynelle Bring, MD;  Location: [facility];  Service: Urology;  Laterality: N/A;  ONLY NEEDS 60 MIN FOR ALL PROCEDURES   TRANSURETHRAL RESECTION OF BLADDER TUMOR N/A 07/02/2017   Procedure: TRANSURETHRAL RESECTION OF BLADDER TUMOR (TURBT);  Surgeon: Raynelle Bring, MD;  Location: [facility];  Service: Urology;  Laterality: N/A;   TRANSURETHRAL RESECTION OF BLADDER TUMOR N/A 08/09/2017   Procedure: TRANSURETHRAL RESECTION OF BLADDER TUMOR (TURBT);  Surgeon: Raynelle Bring, MD;  Location: [facility];  Service: Urology;  Laterality: N/A;    Allergies: Morphine and related and Vicodin  [hydrocodone-acetaminophen]  Medications: Prior to Admission medications   Medication Sig Start Date End Date Taking? Authorizing Provider  cyanocobalamin (,VITAMIN B-12,) 1000 MCG/ML injection Inject 1,000 mcg into the muscle every 30 (thirty) days.    [provider]  dexamethasone (DECADRON) 2 MG tablet Take every other day for 1 week then stop. Patient not taking: Reported on 11/11/2018 10/16/18   [name], MD  dronabinol (MARINOL) 2.5 MG capsule Take 1 capsule (2.5 mg total) by mouth 2 (two) times daily before lunch and supper. 11/06/18   [name], MD  DULoxetine (CYMBALTA) 30 MG capsule Take 1 capsule (30 mg total) by mouth daily. 10/16/18   [name], MD  enoxaparin (LOVENOX) 60 MG/0.6ML injection Inject 0.6 mLs (60 mg total) into the skin every 12 (twelve) hours. 10/13/18 01/11/19  Dessa Phi, DO  fentaNYL (DURAGESIC) 100 MCG/HR Place 1 patch onto the skin every 3 (three) days. 10/23/18   [name], MD  HYDROmorphone (DILAUDID) 2 MG tablet Take 2 tablets (4 mg total) by mouth every 4 (four) hours as needed for severe pain. 11/06/18   [name], MD  lidocaine-prilocaine (EMLA) cream Apply 1 application topically as needed. Patient taking differently: Apply 1 application topically as needed (access).  08/05/18   [name], MD  LORazepam (ATIVAN) 0.5 MG tablet Place 1 tablet (0.5 mg total) under the tongue every 8 (eight) hours as needed (nausea). 10/21/18   Harle Stanford., PA-C  magic mouthwash w/lidocaine SOLN Take 15 mLs by mouth 4 (four) times daily. 09/25/18   [name], MD  Multiple Vitamin (MULTIVITAMIN WITH MINERALS) TABS tablet Take 1 tablet by mouth daily.    [provider]  pantoprazole (PROTONIX) 40 MG tablet TAKE 1 TABLET BY MOUTH EVERY DAY Patient taking differently: Take 40 mg by mouth daily.  11/06/18   [name], MD  polyethylene glycol (MIRALAX / GLYCOLAX) 17 g packet Take 17 g by mouth 2 (two) times  daily. Patient taking differently: Take 17 g by mouth daily as needed for mild constipation.  08/02/18   Sheikh, Georgina Quint Latif, DO  prochlorperazine (COMPAZINE) 10 MG tablet TAKE 1 TABLET BY MOUTH EVERY 6 HOURS AS NEEDED FOR NAUSEA AND VOMITING Patient taking differently: Take 10 mg by mouth every 6 (six) hours as needed for nausea or vomiting.  10/24/18   [name], MD  promethazine (PHENERGAN) 25 MG tablet Take 1 tablet (25 mg total) by mouth every 6 (six) hours as needed for nausea or vomiting. 11/11/18   Madilyn Hook A, PA-C  senna-docusate (SENOKOT-S) 8.6-50 MG tablet Take 2 tablets by mouth 2 (two) times daily. 08/02/18   Kerney Elbe, DO     Family History  Problem Relation Age  of Onset   Heart attack Father    Hypertension Father    Diabetes Father    Heart failure Father    Hypertension Mother    Gastric cancer Sister 93       d. 38   Prostate cancer Brother 78   Melanoma Maternal Aunt    Stroke Maternal Uncle    Stroke Paternal Uncle    Melanoma Maternal Grandmother        dx under 52   Colon cancer Maternal Grandmother    Heart disease Maternal Grandfather    Heart attack Paternal Grandfather     Social History   Socioeconomic History   Marital status: Significant Other    Spouse name: Not on file   Number of children: Not on file   Years of education: Not on file   Highest education level: Not on file  Occupational History   Not on file  Social Needs   Financial resource strain: Not on file   Food insecurity    Worry: Not on file    Inability: Not on file   Transportation needs    Medical: Not on file    Non-medical: Not on file  Tobacco Use   Smoking status: Former Smoker    Packs/day: 2.00    Years: 8.00    Pack years: 16.00    Types: Cigarettes    Quit date: 05/29/2016    Years since quitting: 2.4   Smokeless tobacco: Former Systems developer   Tobacco comment: [facility] 2009  Substance and Sexual Activity   Alcohol use: Not  Currently   Drug use: Not Currently    Types: Marijuana    Comment: last use was 05-29-2017   Sexual activity: Yes  Lifestyle   Physical activity    Days per week: Not on file    Minutes per session: Not on file   Stress: Not on file  Relationships   Social connections    Talks on phone: Not on file    Gets together: Not on file    Attends religious service: Not on file    Active member of club or organization: Not on file    Attends meetings of clubs or organizations: Not on file    Relationship status: Not on file  Other Topics Concern   Not on file  Social History Narrative   Not on file    ECOG Status: 2 - Symptomatic, <50% confined to bed  Review of Systems: A 12 point ROS discussed and pertinent positives are indicated in the HPI above.  All other systems are negative.  Review of Systems  Vital Signs: BP 118/79 (BP Location: Left Arm)    Pulse (!) 117   Physical Exam General: 48 yo male appearing stated age.  Well-developed, well-nourished.  No distress. HEENT: Atraumatic, normocephalic.  Conjugate gaze, extra-ocular motor intact. No scleral icterus or scleral injection. No lesions on external ears, nose, lips, or gums.  Oral mucosa moist, pink.  Neck: Symmetric with no goiter enlargement.  Chest/Lungs:  Symmetric chest with inspiration/expiration.  No labored breathing.  Clear to auscultation with no wheezes, rhonchi, or rales.  Heart:  RRR, with no third heart sounds appreciated. No JVD appreciated.  Abdomen:  Soft, NT/ND, with + bowel sounds.   Genito-urinary: Deferred Neurologic: Alert & Oriented to person, place, and time.   Normal affect and insight.  Appropriate questions.  Moving all 4 extremities with gross sensory intact.  MSK:  TTP along the midline thoracic spine, in  the midspine near the T8and T9 levels. No crepitus.     Mallampati Score:     Imaging: Dg Abdomen 1 View  Result Date: 11/11/2018 CLINICAL DATA:  Nausea and vomiting. EXAM:  ABDOMEN - 1 VIEW COMPARISON:  January 22, 2018 FINDINGS: The bowel gas pattern is nonobstructive and nonspecific. There is height loss of the T12, L1, and L2 vertebral bodies which is new from prior x-ray but likely similar to prior CT dated 10/11/2018 IMPRESSION: Nonobstructive bowel gas pattern. Electronically Signed   By: Constance Holster M.D.   On: 11/11/2018 21:40   Ir Radiologist Eval & Mgmt  Result Date: 11/14/2018 Please refer to notes tab for details about interventional procedure. (Op Note)   Labs:  CBC: Recent Labs    10/23/18 1401 11/06/18 1202 11/11/18 2057 11/13/18 0854  WBC 4.8 27.2* 4.0 2.7*  HGB 8.5* 7.6* 7.2* 6.6*  HCT 25.7* 23.6* 23.0* 19.9*  PLT 229 372 304 207    COAGS: Recent Labs    08/07/18 0935 09/06/18 2201 09/13/18 0432 10/11/18 2208  INR 1.1 1.1 1.2 1.1  APTT  --   --   --  30    BMP: Recent Labs    10/23/18 1401 11/06/18 1202 11/11/18 2057 11/13/18 0854  NA 138 138 136 137  K 4.2 4.3 4.5 4.3  CL 110 104 103 105  CO2 21* 21* 24 21*  GLUCOSE 111* 130* 109* 102*  BUN 13 13 20  24*  CALCIUM 9.3 9.5 9.7 9.8  CREATININE 0.66 0.81 0.64 0.75  GFRNONAA >60 >60 >60 >60  GFRAA >60 >60 >60 >60    LIVER FUNCTION TESTS: Recent Labs    10/23/18 1401 11/06/18 1202 11/11/18 2057 11/13/18 0854  BILITOT <0.2* <0.2* 0.5 0.3  AST 45* 26 70* 51*  ALT 70* 24 69* 61*  ALKPHOS 167* 267* 169* 175*  PROT 6.4* 6.3* 7.0 6.6  ALBUMIN 3.2* 3.0* 3.6 3.1*    TUMOR MARKERS: No results for input(s): AFPTM, CEA, CA199, CHROMGRNA in the last 8760 hours.  Assessment and Plan:  Rodney [name] is a 48 yo male with symptomatic pathologic compression fractures of T8 and T9, metastatic urothelial carcinoma.    He has life-style limiting ongoing and non-remitting pain at the mid thoracic spine, despite prior palliative radiation and high intensity pain medication regimen with dilaudid and fentanyl.   We had a discussion regarding the anatomy,  pathology/pathophysiology, and treatment options for pathologic fractures, including conservative management, surgical fixation, palliative radiation, and vertebral augmentation with/without RFA.    We spent the most of our time discussing the logistics of RFA and augmentation, and the risk benefit analysis.  Specific risks include: bleeding, infection, local injury, need for further surgery/procedure, hospitalization, nerve injury, embolization of cement, cardiopulmonary collapse, death.    I did discuss with him the not-infrequent inflammatory effect that is seen after RFA/augmentation, with post-operative pain sometimes increasing temporarily.   After our discussion, he wishes to proceed with therapy, at the soonest possible time, as he is very motivated to get a chance at relief of pain symptoms.    Plan: - Plan for RFA and augmentation (Osteocool) of T8 and T9 pathologic fractures with moderate sedation.  He prefers to be treated ASAP, which may be with me or one of my partners.   - He will need to hold his anti-coagulation before our procedure.  Given his recent diagnosis, we will need to discuss with his doctor if a bridge will be required, vs a 48-72  hour hold.  - I have encouraged him to observe his other follow up appointments at this time.   Thank you for this interesting consult.  I greatly enjoyed meeting Rodney [name] and look forward to participating in their care.  A copy of this report was sent to the requesting provider on this date.  Electronically Signed: Corrie Mckusick 11/14/2018, 4:04 PM   I spent a total of  40 Minutes   in face to face in clinical consultation, greater than 50% of which was counseling/coordinating care for pathologic fracture of T8 and T9, with possible vertebral augmentation/RFA.

## 2018-11-18 ENCOUNTER — Telehealth: Payer: Self-pay

## 2018-11-18 ENCOUNTER — Other Ambulatory Visit (HOSPITAL_COMMUNITY): Payer: Self-pay | Admitting: Interventional Radiology

## 2018-11-18 ENCOUNTER — Other Ambulatory Visit: Payer: Self-pay | Admitting: Student

## 2018-11-18 DIAGNOSIS — C7951 Secondary malignant neoplasm of bone: Secondary | ICD-10-CM

## 2018-11-18 NOTE — Telephone Encounter (Signed)
-----   Message from [name], MD sent at 11/18/2018  8:48 AM EST ----- Yes. Thanks ----- Message ----- From: [name], RN Sent: 11/18/2018   8:45 AM EST To: [name], MD  Jocelyn Lamer from [facility] called and wants to know if it is ok for patient to hold Lovenox x 1 dose prior to having an [facility] Procedure by Dr. Jacqualyn Posey.  Lanelle Bal

## 2018-11-19 ENCOUNTER — Other Ambulatory Visit: Payer: Self-pay

## 2018-11-19 ENCOUNTER — Ambulatory Visit (HOSPITAL_COMMUNITY)
Admission: RE | Admit: 2018-11-19 | Discharge: 2018-11-19 | Disposition: A | Discharge status: Home / Self Care | Payer: PRIVATE HEALTH INSURANCE | Source: Ambulatory Visit | Attending: Interventional Radiology | Admitting: Interventional Radiology

## 2018-11-19 ENCOUNTER — Encounter (HOSPITAL_COMMUNITY): Payer: Self-pay

## 2018-11-19 VITALS — BP 140/121 | HR 125 | Temp 97.5°F | Resp 18 | Ht 74.0 in | Wt 145.0 lb

## 2018-11-19 DIAGNOSIS — M8448XP Pathological fracture, other site, subsequent encounter for fracture with malunion: Secondary | ICD-10-CM

## 2018-11-19 DIAGNOSIS — D696 Thrombocytopenia, unspecified: Secondary | ICD-10-CM | POA: Diagnosis not present

## 2018-11-19 DIAGNOSIS — C7951 Secondary malignant neoplasm of bone: Secondary | ICD-10-CM | POA: Diagnosis not present

## 2018-11-19 DIAGNOSIS — M549 Dorsalgia, unspecified: Secondary | ICD-10-CM | POA: Diagnosis not present

## 2018-11-19 DIAGNOSIS — Z87891 Personal history of nicotine dependence: Secondary | ICD-10-CM | POA: Insufficient documentation

## 2018-11-19 DIAGNOSIS — C679 Malignant neoplasm of bladder, unspecified: Secondary | ICD-10-CM | POA: Diagnosis not present

## 2018-11-19 DIAGNOSIS — Z79899 Other long term (current) drug therapy: Secondary | ICD-10-CM | POA: Diagnosis not present

## 2018-11-19 DIAGNOSIS — Z539 Procedure and treatment not carried out, unspecified reason: Secondary | ICD-10-CM | POA: Diagnosis not present

## 2018-11-19 LAB — PREPARE PLATELET PHERESIS
Unit division: 0
Unit division: 0

## 2018-11-19 LAB — PROTIME-INR
INR: 1 (ref 0.8–1.2)
Prothrombin Time: 13.3 seconds (ref 11.4–15.2)

## 2018-11-19 LAB — BPAM PLATELET PHERESIS
Blood Product Expiration Date: 202011052359
Blood Product Expiration Date: 202011062359
ISSUE DATE / TIME: 202011031411
ISSUE DATE / TIME: 202011031411
Unit Type and Rh: 6200
Unit Type and Rh: 6200

## 2018-11-19 LAB — CBC
HCT: 29.1 % — ABNORMAL LOW (ref 39.0–52.0)
Hemoglobin: 9.5 g/dL — ABNORMAL LOW (ref 13.0–17.0)
MCH: 31.5 pg (ref 26.0–34.0)
MCHC: 32.6 g/dL (ref 30.0–36.0)
MCV: 96.4 fL (ref 80.0–100.0)
Platelets: 11 10*3/uL — CL (ref 150–400)
RBC: 3.02 MIL/uL — ABNORMAL LOW (ref 4.22–5.81)
RDW: 15.8 % — ABNORMAL HIGH (ref 11.5–15.5)
WBC: 9.9 10*3/uL (ref 4.0–10.5)
nRBC: 0.2 % (ref 0.0–0.2)

## 2018-11-19 LAB — APTT: aPTT: 34 seconds (ref 24–36)

## 2018-11-19 MED ORDER — SODIUM CHLORIDE 0.9% FLUSH: 10.0000 mL | INTRAVENOUS | Status: DC | PRN

## 2018-11-19 MED ORDER — SODIUM CHLORIDE 0.9% FLUSH: 3.0000 mL | INTRAVENOUS | Status: DC | PRN

## 2018-11-19 MED ORDER — DIPHENHYDRAMINE HCL 50 MG/ML IJ SOLN: 25.0000 mg | Freq: Once | INTRAMUSCULAR | Status: DC

## 2018-11-19 MED ORDER — SODIUM CHLORIDE 0.9 % IV SOLN: INTRAVENOUS | Status: DC

## 2018-11-19 MED ORDER — SODIUM CHLORIDE 0.9% IV SOLUTION: 250.0000 mL | Freq: Once | INTRAVENOUS | Status: DC

## 2018-11-19 MED ORDER — HEPARIN SOD (PORK) LOCK FLUSH 100 UNIT/ML IV SOLN: 250.0000 [IU] | INTRAVENOUS | Status: DC | PRN

## 2018-11-19 MED ORDER — HEPARIN SOD (PORK) LOCK FLUSH 100 UNIT/ML IV SOLN
500.0000 [IU] | INTRAVENOUS | Status: AC | PRN
  Administered 2018-11-19: 500 [IU]

## 2018-11-19 MED ORDER — SODIUM CHLORIDE 0.9% FLUSH: 10.0000 mL | Freq: Two times a day (BID) | INTRAVENOUS | Status: DC

## 2018-11-19 MED ORDER — HEPARIN SOD (PORK) LOCK FLUSH 100 UNIT/ML IV SOLN: 500.0000 [IU] | Freq: Every day | INTRAVENOUS | Status: DC | PRN

## 2018-11-19 MED ORDER — CHLORHEXIDINE GLUCONATE CLOTH 2 % EX PADS: 6.0000 | MEDICATED_PAD | Freq: Every day | CUTANEOUS | Status: DC

## 2018-11-19 NOTE — H&P (Signed)
Chief Complaint: Patient was seen in consultation today for Thoracic 8 and 9 osteocool ablation and vertebral augmentation at the request of Shona Simpson NP    Supervising Physician: Corrie Mckusick  Patient Status: [facility] - Out-pt  History of Present Illness: Rodney [name] is a 48 y.o. male   Metastatic Urothelial cancer Bony metastasis Back pain   CT 10/11/18: IMPRESSION: 1. Small bilateral pulmonary emboli. 2. Much improved CT appearance of the chest overall. The mediastinal and hilar adenopathy has resolved and the metastatic rib, sternal and left clavicle lesions appear improved. 3. Persistent significant metastatic disease involving the thoracic spine but there is also evidence of some improvement and no progressive findings. New pathologic compression fractures of T8 and T9. No spinal canal compromise. 4. Resolution of hepatic metastatic disease. 5. Persistent lumbar spine and pelvic metastatic bone lesions but some of the show slight improvement with less significant soft tissue components and some bony healing changes. 6. Status post cysto prostatectomy with neobladder. No complicating features. No abdominal or pelvic lymphadenopathy  Referred to Dr Earleen Newport for evaluation and management of bony lesions. consultation performed 11/14/18. Dr Earleen Newport note: Plan: - Plan for RFA and augmentation (Osteocool) of T8 and T9 pathologic fractures with moderate sedation.  He prefers to be treated ASAP, which may be with me or one of my partners.   - He will need to hold his anti-coagulation before our procedure.  Given his recent diagnosis, we will need to discuss with his doctor if a bridge will be required, vs a 48-72 hour hold.  - I have encouraged him to observe his other follow up appointments at this time  LD Lovenox 11/18/18   Past Medical History:  Diagnosis Date   Anxiety    Bladder tumor    Blood in urine    saw some 3 days ago but none inthe last 2 days     Bradycardia    Depression    Dizziness    historical    Elevated PSA    Family history of gastric cancer    Heartburn    occasional    Pain with urination    Prostate cancer ([place])    Urothelial cancer [facility])     Past Surgical History:  Procedure Laterality Date   BACK SURGERY  2012   3 disc; dr Carloyn Manner  did first and dr elsner did the last 2    CYSTOSCOPY W/ RETROGRADES N/A 07/02/2017   Procedure: CYSTOSCOPY WITH RETROGRADE PYELOGRAM/EXAM UNDER ANESTHESIA;  Surgeon: Raynelle Bring, MD;  Location: [facility];  Service: Urology;  Laterality: N/A;   CYSTOSCOPY WITH BIOPSY N/A 08/09/2017   Procedure: CYSTOSCOPY WITH PROSTATE NEEDLE BIOPSY;  Surgeon: Raynelle Bring, MD;  Location: [facility];  Service: Urology;  Laterality: N/A;  GENERAL ANESTHESIA WITH PARALYSIS/ ONLY NEEDS 60 MIN FOR ALL PROCEDURES   IR IMAGING GUIDED PORT INSERTION  08/07/2018   IR RADIOLOGIST EVAL & MGMT  11/14/2018   LEG SURGERY  2012   4 metal plates in plates    TRANSRECTAL ULTRASOUND N/A 08/09/2017   Procedure: TRANSRECTAL ULTRASOUND;  Surgeon: Raynelle Bring, MD;  Location: [facility];  Service: Urology;  Laterality: N/A;  ONLY NEEDS 60 MIN FOR ALL PROCEDURES   TRANSURETHRAL RESECTION OF BLADDER TUMOR N/A 07/02/2017   Procedure: TRANSURETHRAL RESECTION OF BLADDER TUMOR (TURBT);  Surgeon: Raynelle Bring, MD;  Location: [facility];  Service: Urology;  Laterality: N/A;   TRANSURETHRAL RESECTION OF BLADDER TUMOR N/A 08/09/2017   Procedure: TRANSURETHRAL RESECTION  OF BLADDER TUMOR (TURBT);  Surgeon: Raynelle Bring, MD;  Location: [facility];  Service: Urology;  Laterality: N/A;    Allergies: Morphine and related and Vicodin [hydrocodone-acetaminophen]  Medications: Prior to Admission medications   Medication Sig Start Date End Date Taking? Authorizing Provider  DULoxetine (CYMBALTA) 30 MG capsule Take 1 capsule (30 mg total) by mouth daily. 10/16/18  Yes [name], MD  enoxaparin (LOVENOX) 60 MG/0.6ML injection Inject  0.6 mLs (60 mg total) into the skin every 12 (twelve) hours. 10/13/18 01/11/19 Yes Dessa Phi, DO  fentaNYL (DURAGESIC) 100 MCG/HR Place 1 patch onto the skin every 3 (three) days. 10/23/18  Yes [name], MD  HYDROmorphone (DILAUDID) 2 MG tablet Take 2 tablets (4 mg total) by mouth every 4 (four) hours as needed for severe pain. 11/06/18  Yes [name], MD  lidocaine-prilocaine (EMLA) cream Apply 1 application topically as needed. Patient taking differently: Apply 1 application topically as needed (access).  08/05/18  Yes [name], MD  LORazepam (ATIVAN) 0.5 MG tablet Place 1 tablet (0.5 mg total) under the tongue every 8 (eight) hours as needed (nausea). 10/21/18  Yes Harle Stanford., PA-C  Multiple Vitamin (MULTIVITAMIN WITH MINERALS) TABS tablet Take 1 tablet by mouth daily.   Yes [provider]  pantoprazole (PROTONIX) 40 MG tablet TAKE 1 TABLET BY MOUTH EVERY DAY Patient taking differently: Take 40 mg by mouth daily.  11/06/18  Yes [name], MD  polyethylene glycol (MIRALAX / GLYCOLAX) 17 g packet Take 17 g by mouth 2 (two) times daily. Patient taking differently: Take 17 g by mouth daily as needed for mild constipation.  08/02/18  Yes Sheikh, Omair Latif, DO  prochlorperazine (COMPAZINE) 10 MG tablet TAKE 1 TABLET BY MOUTH EVERY 6 HOURS AS NEEDED FOR NAUSEA AND VOMITING Patient taking differently: Take 10 mg by mouth every 6 (six) hours as needed for nausea or vomiting.  10/24/18  Yes [name], MD  promethazine (PHENERGAN) 25 MG tablet Take 1 tablet (25 mg total) by mouth every 6 (six) hours as needed for nausea or vomiting. 11/11/18  Yes Madilyn Hook A, PA-C  senna-docusate (SENOKOT-S) 8.6-50 MG tablet Take 2 tablets by mouth 2 (two) times daily. 08/02/18  Yes Sheikh, Omair Latif, DO  cyanocobalamin (,VITAMIN B-12,) 1000 MCG/ML injection Inject 1,000 mcg into the muscle every 30 (thirty) days.    [provider]  dexamethasone (DECADRON) 2 MG  tablet Take every other day for 1 week then stop. Patient not taking: Reported on 11/11/2018 10/16/18   [name], MD  dronabinol (MARINOL) 2.5 MG capsule Take 1 capsule (2.5 mg total) by mouth 2 (two) times daily before lunch and supper. 11/06/18   [name], MD  magic mouthwash w/lidocaine SOLN Take 15 mLs by mouth 4 (four) times daily. 09/25/18   [name], MD     Family History  Problem Relation Age of Onset   Heart attack Father    Hypertension Father    Diabetes Father    Heart failure Father    Hypertension Mother    Gastric cancer Sister 63       d. 17   Prostate cancer Brother 33   Melanoma Maternal Aunt    Stroke Maternal Uncle    Stroke Paternal Uncle    Melanoma Maternal Grandmother        dx under 73   Colon cancer Maternal Grandmother    Heart disease Maternal Grandfather  Heart attack Paternal Grandfather     Social History   Socioeconomic History   Marital status: Significant Other    Spouse name: Not on file   Number of children: Not on file   Years of education: Not on file   Highest education level: Not on file  Occupational History   Not on file  Social Needs   Financial resource strain: Not on file   Food insecurity    Worry: Not on file    Inability: Not on file   Transportation needs    Medical: Not on file    Non-medical: Not on file  Tobacco Use   Smoking status: Former Smoker    Packs/day: 2.00    Years: 8.00    Pack years: 16.00    Types: Cigarettes    Quit date: 05/29/2016    Years since quitting: 2.4   Smokeless tobacco: Former Systems developer   Tobacco comment: [place] 2009  Substance and Sexual Activity   Alcohol use: Not Currently   Drug use: Not Currently    Types: Marijuana    Comment: last use was 05-29-2017   Sexual activity: Yes  Lifestyle   Physical activity    Days per week: Not on file    Minutes per session: Not on file   Stress: Not on file  Relationships   Social connections     Talks on phone: Not on file    Gets together: Not on file    Attends religious service: Not on file    Active member of club or organization: Not on file    Attends meetings of clubs or organizations: Not on file    Relationship status: Not on file  Other Topics Concern   Not on file  Social History Narrative   Not on file    Review of Systems: A 12 point ROS discussed and pertinent positives are indicated in the HPI above.  All other systems are negative.  Review of Systems  Constitutional: Positive for activity change and fatigue. Negative for fever.  Respiratory: Negative for cough and shortness of breath.   Cardiovascular: Negative for chest pain.  Gastrointestinal: Negative for nausea.  Musculoskeletal: Positive for back pain.  Neurological: Positive for weakness.  Psychiatric/Behavioral: Negative for behavioral problems and confusion.    Vital Signs: BP (!) 140/121    Pulse (!) 125    Temp (!) 97.5 F (36.4 C) (Skin)    Resp 18    Ht 6\' 2"  (1.88 m)    Wt 145 lb (65.8 kg)    SpO2 100%    BMI 18.62 kg/m   Physical Exam Vitals signs reviewed.  Cardiovascular:     Rate and Rhythm: Normal rate and regular rhythm.     Heart sounds: Normal heart sounds.  Pulmonary:     Breath sounds: Normal breath sounds.  Abdominal:     Tenderness: There is no abdominal tenderness.  Musculoskeletal: Normal range of motion.     Comments: Mid back pain  Skin:    General: Skin is warm and dry.  Neurological:     Mental Status: He is alert and oriented to person, place, and time.  Psychiatric:        Mood and Affect: Mood normal.        Behavior: Behavior normal.        Thought Content: Thought content normal.        Judgment: Judgment normal.     Imaging: Dg Abdomen 1 View  Result  Date: 11/11/2018 CLINICAL DATA:  Nausea and vomiting. EXAM: ABDOMEN - 1 VIEW COMPARISON:  January 22, 2018 FINDINGS: The bowel gas pattern is nonobstructive and nonspecific. There is height loss of the  T12, L1, and L2 vertebral bodies which is new from prior x-ray but likely similar to prior CT dated 10/11/2018 IMPRESSION: Nonobstructive bowel gas pattern. Electronically Signed   By: Constance Holster M.D.   On: 11/11/2018 21:40   Ir Radiologist Eval & Mgmt  Result Date: 11/14/2018 Please refer to notes tab for details about interventional procedure. (Op Note)   Labs:  CBC: Recent Labs    11/06/18 1202 11/11/18 2057 11/13/18 0854 11/19/18 1220  WBC 27.2* 4.0 2.7* 9.9  HGB 7.6* 7.2* 6.6* 9.5*  HCT 23.6* 23.0* 19.9* 29.1*  PLT 372 304 207 PENDING    COAGS: Recent Labs    09/06/18 2201 09/13/18 0432 10/11/18 2208 11/19/18 1220  INR 1.1 1.2 1.1 1.0  APTT  --   --  30 34    BMP: Recent Labs    10/23/18 1401 11/06/18 1202 11/11/18 2057 11/13/18 0854  NA 138 138 136 137  K 4.2 4.3 4.5 4.3  CL 110 104 103 105  CO2 21* 21* 24 21*  GLUCOSE 111* 130* 109* 102*  BUN 13 13 20  24*  CALCIUM 9.3 9.5 9.7 9.8  CREATININE 0.66 0.81 0.64 0.75  GFRNONAA >60 >60 >60 >60  GFRAA >60 >60 >60 >60    LIVER FUNCTION TESTS: Recent Labs    10/23/18 1401 11/06/18 1202 11/11/18 2057 11/13/18 0854  BILITOT <0.2* <0.2* 0.5 0.3  AST 45* 26 70* 51*  ALT 70* 24 69* 61*  ALKPHOS 167* 267* 169* 175*  PROT 6.4* 6.3* 7.0 6.6  ALBUMIN 3.2* 3.0* 3.6 3.1*    TUMOR MARKERS: No results for input(s): AFPTM, CEA, CA199, CHROMGRNA in the last 8760 hours.  Assessment and Plan:  Hx Urothelial cancer Bony metastasis Back pain worsening New painful pathologic fractures Thoracic 8 and 9 Consultation with Dr Earleen Newport regarding Osteocool ablation and vertebral augmentation Scheduled now for same Risks and benefits of Thoracic 8 and 9 Osteocool ablation and vertebral augmentation were discussed with the patient including, but not limited to education regarding the natural healing process of compression fractures without intervention, bleeding, infection, cement migration which may cause spinal  cord damage, paralysis, pulmonary embolism or even death.  This interventional procedure involves the use of X-rays and because of the n ature of the planned procedure, it is possible that we will have prolonged use of X-ray fluoroscopy.  Potential radiation risks to you include (but are not limited to) the following: - A slightly elevated risk for cancer  several years later in life. This risk is typically less than 0.5% percent. This risk is low in comparison to the normal incidence of human cancer, which is 33% for women and 50% for men according to the [place]. - Radiation induced injury can include skin redness, resembling a rash, tissue breakdown / ulcers and hair loss (which can be temporary or permanent).   The likelihood of either of these occurring depends on the difficulty of the procedure and whether you are sensitive to radiation due to previous procedures, disease, or genetic conditions.   IF your procedure requires a prolonged use of radiation, you will be notified and given written instructions for further action.  It is your responsibility to monitor the irradiated area for the 2 weeks following the procedure and to notify your physician  if you are concerned that you have suffered a radiation induced injury.    All of the patient's questions were answered, patient is agreeable to proceed.  Consent signed and in chart.   Thank you for this interesting consult.  I greatly enjoyed meeting Rodney [name] and look forward to participating in their care.  A copy of this report was sent to the requesting provider on this date.  Electronically Signed: Lavonia Drafts, PA-C 11/19/2018, 1:27 PM   I spent a total of  30 Minutes   in face to face in clinical consultation, greater than 50% of which was counseling/coordinating care for T8/T9 osteocool ablation and Kyphoplasty

## 2018-11-19 NOTE — Discharge Instructions (Addendum)
Moderate Conscious Sedation, Adult, Care After °These instructions provide you with information about caring for yourself after your procedure. Your health care provider may also give you more specific instructions. Your treatment has been planned according to current medical practices, but problems sometimes occur. Call your health care provider if you have any problems or questions after your procedure. °What can I expect after the procedure? °After your procedure, it is common: °· To feel sleepy for several hours. °· To feel clumsy and have poor balance for several hours. °· To have poor judgment for several hours. °· To vomit if you eat too soon. °Follow these instructions at home: °For at least 24 hours after the procedure: ° °· Do not: °? Participate in activities where you could fall or become injured. °? Drive. °? Use heavy machinery. °? Drink alcohol. °? Take sleeping pills or medicines that cause drowsiness. °? Make important decisions or sign legal documents. °? Take care of children on your own. °· Rest. °Eating and drinking °· Follow the diet recommended by your health care provider. °· If you vomit: °? Drink water, juice, or soup when you can drink without vomiting. °? Make sure you have little or no nausea before eating solid foods. °General instructions °· Have a responsible adult stay with you until you are awake and alert. °· Take over-the-counter and prescription medicines only as told by your health care provider. °· If you smoke, do not smoke without supervision. °· Keep all follow-up visits as told by your health care provider. This is important. °Contact a health care provider if: °· You keep feeling nauseous or you keep vomiting. °· You feel light-headed. °· You develop a rash. °· You have a fever. °Get help right away if: °· You have trouble breathing. °This information is not intended to replace advice given to you by your health care provider. Make sure you discuss any questions you have  with your health care provider. °Document Released: 10/23/2012 Document Revised: 12/15/2016 Document Reviewed: 04/24/2015 °Elsevier Patient Education © 2020 [employer] ° °

## 2018-11-20 LAB — PATHOLOGIST SMEAR REVIEW

## 2018-11-25 ENCOUNTER — Other Ambulatory Visit: Payer: Self-pay | Admitting: Oncology

## 2018-11-26 ENCOUNTER — Other Ambulatory Visit: Payer: Self-pay | Admitting: Medical

## 2018-11-27 ENCOUNTER — Inpatient Hospital Stay: Payer: PRIVATE HEALTH INSURANCE

## 2018-11-27 ENCOUNTER — Other Ambulatory Visit: Payer: Self-pay

## 2018-11-27 ENCOUNTER — Inpatient Hospital Stay: Payer: PRIVATE HEALTH INSURANCE | Attending: Oncology | Admitting: Oncology

## 2018-11-27 VITALS — BP 108/79 | HR 92 | Temp 98.7°F | Resp 18 | Ht 74.0 in | Wt 149.9 lb

## 2018-11-27 DIAGNOSIS — D6481 Anemia due to antineoplastic chemotherapy: Secondary | ICD-10-CM | POA: Diagnosis not present

## 2018-11-27 DIAGNOSIS — Z7901 Long term (current) use of anticoagulants: Secondary | ICD-10-CM | POA: Diagnosis not present

## 2018-11-27 DIAGNOSIS — C679 Malignant neoplasm of bladder, unspecified: Secondary | ICD-10-CM

## 2018-11-27 DIAGNOSIS — D709 Neutropenia, unspecified: Secondary | ICD-10-CM | POA: Insufficient documentation

## 2018-11-27 DIAGNOSIS — C801 Malignant (primary) neoplasm, unspecified: Secondary | ICD-10-CM

## 2018-11-27 DIAGNOSIS — C61 Malignant neoplasm of prostate: Secondary | ICD-10-CM | POA: Diagnosis not present

## 2018-11-27 DIAGNOSIS — C7951 Secondary malignant neoplasm of bone: Secondary | ICD-10-CM

## 2018-11-27 DIAGNOSIS — Z86711 Personal history of pulmonary embolism: Secondary | ICD-10-CM | POA: Insufficient documentation

## 2018-11-27 DIAGNOSIS — Z86718 Personal history of other venous thrombosis and embolism: Secondary | ICD-10-CM | POA: Diagnosis not present

## 2018-11-27 DIAGNOSIS — Z5111 Encounter for antineoplastic chemotherapy: Secondary | ICD-10-CM | POA: Insufficient documentation

## 2018-11-27 DIAGNOSIS — R634 Abnormal weight loss: Secondary | ICD-10-CM | POA: Insufficient documentation

## 2018-11-27 DIAGNOSIS — Z9079 Acquired absence of other genital organ(s): Secondary | ICD-10-CM | POA: Insufficient documentation

## 2018-11-27 DIAGNOSIS — T451X5A Adverse effect of antineoplastic and immunosuppressive drugs, initial encounter: Secondary | ICD-10-CM | POA: Insufficient documentation

## 2018-11-27 DIAGNOSIS — Z923 Personal history of irradiation: Secondary | ICD-10-CM | POA: Diagnosis not present

## 2018-11-27 DIAGNOSIS — Z95828 Presence of other vascular implants and grafts: Secondary | ICD-10-CM

## 2018-11-27 DIAGNOSIS — Z79899 Other long term (current) drug therapy: Secondary | ICD-10-CM | POA: Insufficient documentation

## 2018-11-27 LAB — CMP (CANCER CENTER ONLY)
ALT: 23 U/L (ref 0–44)
AST: 24 U/L (ref 15–41)
Albumin: 3.3 g/dL — ABNORMAL LOW (ref 3.5–5.0)
Alkaline Phosphatase: 222 U/L — ABNORMAL HIGH (ref 38–126)
Anion gap: 10 (ref 5–15)
BUN: 10 mg/dL (ref 6–20)
CO2: 23 mmol/L (ref 22–32)
Calcium: 9.6 mg/dL (ref 8.9–10.3)
Chloride: 103 mmol/L (ref 98–111)
Creatinine: 0.82 mg/dL (ref 0.61–1.24)
GFR, Est AFR Am: >60 mL/min (ref >60)
GFR, Estimated: >60 mL/min (ref >60)
Glucose, Bld: 109 mg/dL — ABNORMAL HIGH (ref 70–99)
Potassium: 4.7 mmol/L (ref 3.5–5.1)
Sodium: 136 mmol/L (ref 135–145)
Total Bilirubin: <0.2 mg/dL — ABNORMAL LOW (ref 0.3–1.2)
Total Protein: 6.4 g/dL — ABNORMAL LOW (ref 6.5–8.1)

## 2018-11-27 LAB — CBC WITH DIFFERENTIAL (CANCER CENTER ONLY)
Abs Immature Granulocytes: 3.78 10*3/uL — ABNORMAL HIGH (ref 0.00–0.07)
Basophils Absolute: 0.2 10*3/uL — ABNORMAL HIGH (ref 0.0–0.1)
Basophils Relative: 1 %
Eosinophils Absolute: 0 10*3/uL (ref 0.0–0.5)
Eosinophils Relative: 0 %
HCT: 27.8 % — ABNORMAL LOW (ref 39.0–52.0)
Hemoglobin: 8.9 g/dL — ABNORMAL LOW (ref 13.0–17.0)
Immature Granulocytes: 12 %
Lymphocytes Relative: 4 %
Lymphs Abs: 1.2 10*3/uL (ref 0.7–4.0)
MCH: 31.9 pg (ref 26.0–34.0)
MCHC: 32 g/dL (ref 30.0–36.0)
MCV: 99.6 fL (ref 80.0–100.0)
Monocytes Absolute: 3.3 10*3/uL — ABNORMAL HIGH (ref 0.1–1.0)
Monocytes Relative: 11 %
Neutro Abs: 22 10*3/uL — ABNORMAL HIGH (ref 1.7–7.7)
Neutrophils Relative %: 72 %
Platelet Count: 364 10*3/uL (ref 150–400)
RBC: 2.79 MIL/uL — ABNORMAL LOW (ref 4.22–5.81)
RDW: 18.9 % — ABNORMAL HIGH (ref 11.5–15.5)
WBC Count: 30.5 10*3/uL — ABNORMAL HIGH (ref 4.0–10.5)
nRBC: 0.9 % — ABNORMAL HIGH (ref 0.0–0.2)

## 2018-11-27 LAB — TYPE AND SCREEN
ABO/RH(D): O POS
Antibody Screen: NEGATIVE

## 2018-11-27 MED ORDER — PALONOSETRON HCL INJECTION 0.25 MG/5ML
INTRAVENOUS | Status: AC
  Filled 2018-11-27: qty 5

## 2018-11-27 MED ORDER — PROMETHAZINE HCL 25 MG PO TABS: 25.0000 mg | ORAL_TABLET | Freq: Four times a day (QID) | ORAL | 0 refills | Status: DC | PRN

## 2018-11-27 MED ORDER — FENTANYL 100 MCG/HR TD PT72: 1.0000 | MEDICATED_PATCH | TRANSDERMAL | 0 refills | Status: DC

## 2018-11-27 MED ORDER — SODIUM CHLORIDE 0.9 % IV SOLN
626.5000 mg | Freq: Once | INTRAVENOUS | Status: AC
  Administered 2018-11-27: 630 mg via INTRAVENOUS
  Filled 2018-11-27: qty 63

## 2018-11-27 MED ORDER — SODIUM CHLORIDE 0.9 % IV SOLN
Freq: Once | INTRAVENOUS | Status: AC
  Administered 2018-11-27: 09:00:00 via INTRAVENOUS
  Filled 2018-11-27: qty 250

## 2018-11-27 MED ORDER — PALONOSETRON HCL INJECTION 0.25 MG/5ML
0.2500 mg | Freq: Once | INTRAVENOUS | Status: AC
  Administered 2018-11-27: 0.25 mg via INTRAVENOUS

## 2018-11-27 MED ORDER — LORAZEPAM 0.5 MG PO TABS: 0.5000 mg | ORAL_TABLET | Freq: Three times a day (TID) | ORAL | 0 refills | Status: DC | PRN

## 2018-11-27 MED ORDER — SODIUM CHLORIDE 0.9% FLUSH
10.0000 mL | Freq: Once | INTRAVENOUS | Status: AC
  Administered 2018-11-27: 10 mL
  Filled 2018-11-27: qty 10

## 2018-11-27 MED ORDER — DRONABINOL 2.5 MG PO CAPS: 2.5000 mg | ORAL_CAPSULE | Freq: Two times a day (BID) | ORAL | 0 refills | Status: DC

## 2018-11-27 MED ORDER — HEPARIN SOD (PORK) LOCK FLUSH 100 UNIT/ML IV SOLN
500.0000 [IU] | Freq: Once | INTRAVENOUS | Status: AC | PRN
  Administered 2018-11-27: 500 [IU]
  Filled 2018-11-27: qty 5

## 2018-11-27 MED ORDER — SODIUM CHLORIDE 0.9% FLUSH
10.0000 mL | INTRAVENOUS | Status: DC | PRN
  Administered 2018-11-27: 10 mL
  Filled 2018-11-27: qty 10

## 2018-11-27 MED ORDER — SODIUM CHLORIDE 0.9 % IV SOLN
Freq: Once | INTRAVENOUS | Status: AC
  Administered 2018-11-27: 09:00:00 via INTRAVENOUS
  Filled 2018-11-27: qty 5

## 2018-11-27 MED ORDER — SODIUM CHLORIDE 0.9 % IV SOLN
1000.0000 mg/m2 | Freq: Once | INTRAVENOUS | Status: AC
  Administered 2018-11-27: 1786 mg via INTRAVENOUS
  Filled 2018-11-27: qty 46.97

## 2018-11-27 NOTE — Patient Instructions (Signed)

## 2018-11-27 NOTE — Progress Notes (Signed)
Hematology and Oncology Follow Up Visit  Rodney [name] ID:[id number] 11/25/1970 48 y.o. 11/27/2018 8:05 AM Rodney [name], MDBurdine, [name], MD   Principle Diagnosis: 48 year old man with:  1.  Bladder cancer diagnosed in 2019.  He is subsequently found to have stage IV disease with bone and pulmonary involvement in 2020.    2.  Prostate cancer diagnosed in 2019.  He is status post prostatectomy for Gleason score 6 disease in 2019.  Prior Therapy:  He is status post a robotic assisted laparoscopic radical cystoprostatectomy and bilateral pelvic lymphadenectomy and neobladder creation completed on November 08, 2017 by Dr. Alinda [name].  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  Prostate adenocarcinoma is 3+4 = 7 with 0 out of 14 lymph nodes noted.  He is status post radiation to the cervical and thoracic spine for a total of 30 Gy in 10 fractions completed on August 06, 2018.  Current therapy: Gemcitabine and cisplatin cycle 1 started on 08/13/2018.  He is currently receiving carboplatin and gemcitabine and has completed 5 cycles of therapy.  He is here for evaluation prior to cycle 6.  Interim History: Rodney [name] is here for a follow-up visit.  Since the last visit, he had completed last cycle of chemotherapy with few complaints including increased nausea and vomiting.  He has also been evaluated for possible compression fracture of T8 and T9 and intervention with osteocool procedure that was delayed because of thrombocytopenia.  He reports feeling reasonably well at this time with his nausea currently improved with the help of Phenergan and Compazine which she alternates.  He denies any excessive fatigue or tiredness but does report improvement in his appetite and weight is stable.  He denies any worsening pain at this time although he still requiring fair amount of breakthrough pain medication despite fentanyl patch.   He denied headaches, blurry vision, syncope or  seizures.  Denies any fevers, chills or sweats.  Denied chest pain, palpitation, orthopnea or leg edema.  Denied cough, wheezing or hemoptysis.  Denied nausea, vomiting or abdominal pain.  Denies any constipation or diarrhea.  Denies any frequency urgency or hesitancy.  Denies any arthralgias or myalgias.  Denies any skin rashes or lesions.  Denies any bleeding or clotting tendency.  Denies any easy bruising.  Denies any hair or nail changes.  Denies any anxiety or depression.  Remaining review of system is negative.                         Medications: Reviewed without any changes. Current Outpatient Medications  Medication Sig Dispense Refill  . cyanocobalamin (,VITAMIN B-12,) 1000 MCG/ML injection Inject 1,000 mcg into the muscle every 30 (thirty) days.    [name] dexamethasone (DECADRON) 2 MG tablet Take every other day for 1 week then stop. (Patient not taking: Reported on 11/11/2018) 20 tablet 0  . dronabinol (MARINOL) 2.5 MG capsule Take 1 capsule (2.5 mg total) by mouth 2 (two) times daily before lunch and supper. 30 capsule 0  . DULoxetine (CYMBALTA) 30 MG capsule Take 1 capsule (30 mg total) by mouth daily. 90 capsule 3  . enoxaparin (LOVENOX) 60 MG/0.6ML injection Inject 0.6 mLs (60 mg total) into the skin every 12 (twelve) hours. 36 mL 2  . fentaNYL (DURAGESIC) 100 MCG/HR Place 1 patch onto the skin every 3 (three) days. 10 patch 0  . HYDROmorphone (DILAUDID) 2 MG tablet Take 2 tablets (4 mg total) by mouth  every 4 (four) hours as needed for severe pain. 90 tablet 0  . lidocaine-prilocaine (EMLA) cream Apply 1 application topically as needed. (Patient taking differently: Apply 1 application topically as needed (access). ) 30 g 0  . LORazepam (ATIVAN) 0.5 MG tablet Place 1 tablet (0.5 mg total) under the tongue every 8 (eight) hours as needed (nausea). 35 tablet 0  . magic mouthwash w/lidocaine SOLN Take 15 mLs by mouth 4 (four) times daily. 400 mL 0  . Multiple Vitamin  (MULTIVITAMIN WITH MINERALS) TABS tablet Take 1 tablet by mouth daily.    . ondansetron (ZOFRAN) 4 MG tablet TAKE 1 TABLET BY MOUTH EVERY 6 HOURS AS NEEDED FOR NAUSEA 20 tablet 0  . pantoprazole (PROTONIX) 40 MG tablet TAKE 1 TABLET BY MOUTH EVERY DAY (Patient taking differently: Take 40 mg by mouth daily. ) 30 tablet 0  . polyethylene glycol (MIRALAX / GLYCOLAX) 17 g packet Take 17 g by mouth 2 (two) times daily. (Patient taking differently: Take 17 g by mouth daily as needed for mild constipation. ) 14 each 0  . prochlorperazine (COMPAZINE) 10 MG tablet TAKE 1 TABLET BY MOUTH EVERY 6 HOURS AS NEEDED FOR NAUSEA AND VOMITING (Patient taking differently: Take 10 mg by mouth every 6 (six) hours as needed for nausea or vomiting. ) 30 tablet 0  . promethazine (PHENERGAN) 25 MG tablet Take 1 tablet (25 mg total) by mouth every 6 (six) hours as needed for nausea or vomiting. 30 tablet 0  . senna-docusate (SENOKOT-S) 8.6-50 MG tablet Take 2 tablets by mouth 2 (two) times daily. 60 tablet 0   No current facility-administered medications for this visit.      Allergies:  Allergies  Allergen Reactions  . Morphine And Related Itching  . Vicodin [Hydrocodone-Acetaminophen] Itching    Past Medical History, Surgical history, Social history, and Family History unchanged with review.    Physical Exam:   Blood pressure 108/79, pulse 92, temperature 98.7 F (37.1 C), temperature source Temporal, resp. rate 18, height 6\' 2"  (1.88 m), weight 149 lb 14.4 oz (68 kg), SpO2 100 %.      ECOG: 2      General appearance: Alert, awake without any distress. Head: Atraumatic without abnormalities Oropharynx: Without any thrush or ulcers. Eyes: No scleral icterus. Lymph nodes: No lymphadenopathy noted in the cervical, supraclavicular, or axillary nodes Heart:regular rate and rhythm, without any murmurs or gallops.   Lung: Clear to auscultation without any rhonchi, wheezes or dullness to  percussion. Abdomin: Soft, nontender without any shifting dullness or ascites. Musculoskeletal: No clubbing or cyanosis. Neurological: No motor or sensory deficits. Skin: No rashes or lesions.          Lab Results: Lab Results  Component Value Date   WBC 9.9 11/19/2018   HGB 9.5 (L) 11/19/2018   HCT 29.1 (L) 11/19/2018   MCV 96.4 11/19/2018   PLT 11 (LL) 11/19/2018     Chemistry      Component Value Date/Time   NA 137 11/13/2018 0854   K 4.3 11/13/2018 0854   CL 105 11/13/2018 0854   CO2 21 (L) 11/13/2018 0854   BUN 24 (H) 11/13/2018 0854   CREATININE 0.75 11/13/2018 0854      Component Value Date/Time   CALCIUM 9.8 11/13/2018 0854   ALKPHOS 175 (H) 11/13/2018 0854   AST 51 (H) 11/13/2018 0854   ALT 61 (H) 11/13/2018 0854   BILITOT 0.3 11/13/2018 0854        Impression and  Plan:   48 year old with:   1.  Bladder cancer diagnosed in 2019 and subsequently found to have stage IV disease with multiple areas of metastasis.  He had an excellent response to chemotherapy and has tolerated it reasonably well.  Risks and benefits of completing 6 cycles of chemotherapy was reviewed today.  Plan is to repeat imaging study after cycle 6 and potentially proceed with a switch maintenance afterwards.  He is agreeable to proceed with this plan.   2.  IV access:  Port-A-Cath remains in place and without any issues.   3.  Antiemetics: He is given instructions how to continue to use Phenergan and Compazine.   4.  Goals of care:  His disease is incurable although aggressive measures are warranted at this time.   5.  Chronic pain: He is currently on fentanyl patch as well as Dilaudid for breakthrough.  Is also under evaluation with interventional radiology regarding his compression fracture.  6.  Weight loss: Weight is stable at this time we will continue to encourage adding nutritional supplements for the time being.  7.  Deep vein thrombosis and pulmonary embolism:  Continues to tolerate Lovenox without any issue.  8.  Neutropenia: He is at risk of neutropenic sepsis is of a his malignancy and diffuse bone disease.  He will continue to receive that after each cycle of therapy.  9.  Follow-up: He will return in 1 week for day 8 of therapy and in the next few weeks after complete imaging studies.   25 minutes was spent with the patient face-to-face today.  More than 50% of time was spent on updating his disease status, discussing treatment options and outlining future plan of care.    Zola Button, MD 11/11/20208:05 AM

## 2018-11-27 NOTE — Patient Instructions (Signed)
[facility] Discharge Instructions for Patients Receiving Chemotherapy  Today you received the following chemotherapy agents: Gemzar/Carboplatin.  To help prevent nausea and vomiting after your treatment, we encourage you to take your nausea medication as directed.   If you develop nausea and vomiting that is not controlled by your nausea medication, call the clinic.   BELOW ARE SYMPTOMS THAT SHOULD BE REPORTED IMMEDIATELY:  *FEVER GREATER THAN 100.5 F  *CHILLS WITH OR WITHOUT FEVER  NAUSEA AND VOMITING THAT IS NOT CONTROLLED WITH YOUR NAUSEA MEDICATION  *UNUSUAL SHORTNESS OF BREATH  *UNUSUAL BRUISING OR BLEEDING  TENDERNESS IN MOUTH AND THROAT WITH OR WITHOUT PRESENCE OF ULCERS  *URINARY PROBLEMS  *BOWEL PROBLEMS  UNUSUAL RASH Items with * indicate a potential emergency and should be followed up as soon as possible.  Feel free to call the clinic should you have any questions or concerns. The clinic phone number is [phone].  Please show the CHEMO ALERT CARD at check-in to the Emergency Department and triage nurse.   

## 2018-11-28 ENCOUNTER — Telehealth: Payer: Self-pay | Admitting: Oncology

## 2018-11-28 ENCOUNTER — Telehealth: Payer: Self-pay

## 2018-11-28 NOTE — Telephone Encounter (Signed)
Scheduled appt per 11/11 los.  Spoke with pt and he is aware of the appt date and time.

## 2018-11-28 NOTE — Telephone Encounter (Signed)
Caryl Pina from [facility] called and stated patient was supposed to have T8-9 ablation earlier this week but there were not any openings. Per Caryl Pina, she can schedule patient tomorrow or early next week. Dr. [name] made aware and stated tomorrow will be fine. Next week may be problematic because patient's counts might drop from previous chemotherapy received this week. Caryl Pina made aware and stated she will get patient scheduled for tomorrow.

## 2018-11-29 ENCOUNTER — Ambulatory Visit (HOSPITAL_COMMUNITY)
Admission: RE | Admit: 2018-11-29 | Discharge: 2018-11-29 | Disposition: A | Discharge status: Home / Self Care | Payer: PRIVATE HEALTH INSURANCE | Source: Ambulatory Visit | Attending: Interventional Radiology | Admitting: Interventional Radiology

## 2018-11-29 ENCOUNTER — Other Ambulatory Visit (HOSPITAL_COMMUNITY): Payer: Self-pay | Admitting: Interventional Radiology

## 2018-11-29 ENCOUNTER — Other Ambulatory Visit: Payer: Self-pay | Admitting: Physician Assistant

## 2018-11-29 ENCOUNTER — Encounter (HOSPITAL_COMMUNITY): Payer: Self-pay | Admitting: Interventional Radiology

## 2018-11-29 ENCOUNTER — Other Ambulatory Visit: Payer: Self-pay

## 2018-11-29 DIAGNOSIS — C61 Malignant neoplasm of prostate: Secondary | ICD-10-CM | POA: Diagnosis not present

## 2018-11-29 DIAGNOSIS — F329 Major depressive disorder, single episode, unspecified: Secondary | ICD-10-CM | POA: Diagnosis not present

## 2018-11-29 DIAGNOSIS — C7951 Secondary malignant neoplasm of bone: Secondary | ICD-10-CM | POA: Diagnosis not present

## 2018-11-29 DIAGNOSIS — F419 Anxiety disorder, unspecified: Secondary | ICD-10-CM | POA: Diagnosis not present

## 2018-11-29 DIAGNOSIS — C679 Malignant neoplasm of bladder, unspecified: Secondary | ICD-10-CM | POA: Insufficient documentation

## 2018-11-29 DIAGNOSIS — Z79899 Other long term (current) drug therapy: Secondary | ICD-10-CM | POA: Insufficient documentation

## 2018-11-29 DIAGNOSIS — Z87891 Personal history of nicotine dependence: Secondary | ICD-10-CM | POA: Diagnosis not present

## 2018-11-29 HISTORY — PX: IR KYPHO EA ADDL LEVEL THORACIC OR LUMBAR: IMG5520

## 2018-11-29 HISTORY — PX: IR BONE TUMOR(S)RF ABLATION: IMG2284

## 2018-11-29 HISTORY — PX: IR KYPHO THORACIC WITH BONE BIOPSY: IMG5518

## 2018-11-29 LAB — CBC
HCT: 30.7 % — ABNORMAL LOW (ref 39.0–52.0)
Hemoglobin: 9.4 g/dL — ABNORMAL LOW (ref 13.0–17.0)
MCH: 31.3 pg (ref 26.0–34.0)
MCHC: 30.6 g/dL (ref 30.0–36.0)
MCV: 102.3 fL — ABNORMAL HIGH (ref 80.0–100.0)
Platelets: 409 10*3/uL — ABNORMAL HIGH (ref 150–400)
RBC: 3 MIL/uL — ABNORMAL LOW (ref 4.22–5.81)
RDW: 18.9 % — ABNORMAL HIGH (ref 11.5–15.5)
WBC: 16.9 10*3/uL — ABNORMAL HIGH (ref 4.0–10.5)
nRBC: 0.2 % (ref 0.0–0.2)

## 2018-11-29 LAB — PROTIME-INR
INR: 1.1 (ref 0.8–1.2)
Prothrombin Time: 13.8 seconds (ref 11.4–15.2)

## 2018-11-29 MED ORDER — BUPIVACAINE HCL (PF) 0.25 % IJ SOLN
INTRAMUSCULAR | Status: AC
  Administered 2018-11-29: 20 mL
  Filled 2018-11-29: qty 30

## 2018-11-29 MED ORDER — IOHEXOL 300 MG/ML  SOLN
50.0000 mL | Freq: Once | INTRAMUSCULAR | Status: AC | PRN
  Administered 2018-11-29: 20 mL

## 2018-11-29 MED ORDER — SODIUM CHLORIDE 0.9 % IV SOLN
INTRAVENOUS | Status: DC
  Administered 2018-11-29: 11:00:00 via INTRAVENOUS

## 2018-11-29 MED ORDER — FENTANYL CITRATE (PF) 100 MCG/2ML IJ SOLN
INTRAMUSCULAR | Status: AC
  Filled 2018-11-29: qty 4

## 2018-11-29 MED ORDER — MIDAZOLAM HCL 2 MG/2ML IJ SOLN
INTRAMUSCULAR | Status: AC
  Filled 2018-11-29: qty 4

## 2018-11-29 MED ORDER — MIDAZOLAM HCL 2 MG/2ML IJ SOLN
INTRAMUSCULAR | Status: AC | PRN
  Administered 2018-11-29 (×2): 1 mg via INTRAVENOUS
  Administered 2018-11-29 (×2): 0.5 mg via INTRAVENOUS
  Administered 2018-11-29 (×3): 1 mg via INTRAVENOUS

## 2018-11-29 MED ORDER — HYDROMORPHONE HCL 1 MG/ML IJ SOLN
INTRAMUSCULAR | Status: AC | PRN
  Administered 2018-11-29: 1 mg via INTRAVENOUS
  Administered 2018-11-29 (×2): 0.5 mg via INTRAVENOUS

## 2018-11-29 MED ORDER — FENTANYL CITRATE (PF) 100 MCG/2ML IJ SOLN
INTRAMUSCULAR | Status: AC | PRN
  Administered 2018-11-29 (×5): 25 ug via INTRAVENOUS
  Administered 2018-11-29: 50 ug via INTRAVENOUS
  Administered 2018-11-29: 25 ug via INTRAVENOUS
  Administered 2018-11-29 (×2): 50 ug via INTRAVENOUS

## 2018-11-29 MED ORDER — MIDAZOLAM HCL 2 MG/2ML IJ SOLN
INTRAMUSCULAR | Status: AC
  Filled 2018-11-29: qty 2

## 2018-11-29 MED ORDER — FENTANYL CITRATE (PF) 100 MCG/2ML IJ SOLN
INTRAMUSCULAR | Status: AC
  Filled 2018-11-29: qty 2

## 2018-11-29 MED ORDER — HYDROMORPHONE HCL 1 MG/ML IJ SOLN
INTRAMUSCULAR | Status: AC
  Filled 2018-11-29: qty 1

## 2018-11-29 MED ORDER — KETOROLAC TROMETHAMINE 15 MG/ML IJ SOLN
INTRAMUSCULAR | Status: AC
  Administered 2018-11-29: 30 mg
  Filled 2018-11-29: qty 2

## 2018-11-29 MED ORDER — KETOROLAC TROMETHAMINE 30 MG/ML IJ SOLN: 30.0000 mg | Freq: Once | INTRAMUSCULAR | Status: DC

## 2018-11-29 NOTE — H&P (Signed)
Chief Complaint: Patient was seen in consultation today for T8/9 osteocool ablation and vertebral augmentation.  Referring Physician(s): Shona Simpson, NP  Supervising Physician: Corrie Mckusick  Patient Status: [facility] - Out-pt  History of Present Illness: Rodney [name] is a 48 y.o. male with a past medical history significant for anxiety, depression, bradycardia, prostate cancer, urothelial cancer with bony metastasis followed by Dr. [name] who presents today for T8/9 osteocool ablation with vertebral augmentation. Rodney [name] presented to IR for same on 11/19/18, however he was noted to be severely thrombocytopenic and the procedure was unable to be performed at that time. He has since received platelets and presents again today for previously discussed procedure. He denies any new complaints today except that he feels like his catheter is tugging a bit. He still continue to have severe back pain as previously described.  He denies any fevers, chills, dyspnea, cough, abdominal pain or diarrhea. He does endorse nausea and vomiting related to chemotherapy treatments which is well controlled with phenergan. He states understanding of the requested procedure and wishes to proceed.   Past Medical History:  Diagnosis Date  . Anxiety   . Bladder tumor   . Blood in urine    saw some 3 days ago but none inthe last 2 days   . Bradycardia   . Depression   . Dizziness    historical   . Elevated PSA   . Family history of gastric cancer   . Heartburn    occasional   . Pain with urination   . Prostate cancer ([place])   . Urothelial cancer [facility])     Past Surgical History:  Procedure Laterality Date  . BACK SURGERY  2012   3 disc; dr Carloyn Manner  did first and dr elsner did the last 2   . CYSTOSCOPY W/ RETROGRADES N/A 07/02/2017   Procedure: CYSTOSCOPY WITH RETROGRADE PYELOGRAM/EXAM UNDER ANESTHESIA;  Surgeon: Raynelle Bring, MD;  Location: [facility];  Service: Urology;  Laterality: N/A;  . CYSTOSCOPY WITH  BIOPSY N/A 08/09/2017   Procedure: CYSTOSCOPY WITH PROSTATE NEEDLE BIOPSY;  Surgeon: Raynelle Bring, MD;  Location: [facility];  Service: Urology;  Laterality: N/A;  GENERAL ANESTHESIA WITH PARALYSIS/ ONLY NEEDS 60 MIN FOR ALL PROCEDURES  . IR IMAGING GUIDED PORT INSERTION  08/07/2018  . IR RADIOLOGIST EVAL & MGMT  11/14/2018  . LEG SURGERY  2012   4 metal plates in plates   . TRANSRECTAL ULTRASOUND N/A 08/09/2017   Procedure: TRANSRECTAL ULTRASOUND;  Surgeon: Raynelle Bring, MD;  Location: [facility];  Service: Urology;  Laterality: N/A;  ONLY NEEDS 60 MIN FOR ALL PROCEDURES  . TRANSURETHRAL RESECTION OF BLADDER TUMOR N/A 07/02/2017   Procedure: TRANSURETHRAL RESECTION OF BLADDER TUMOR (TURBT);  Surgeon: Raynelle Bring, MD;  Location: [facility];  Service: Urology;  Laterality: N/A;  . TRANSURETHRAL RESECTION OF BLADDER TUMOR N/A 08/09/2017   Procedure: TRANSURETHRAL RESECTION OF BLADDER TUMOR (TURBT);  Surgeon: Raynelle Bring, MD;  Location: [facility];  Service: Urology;  Laterality: N/A;    Allergies: Morphine and related and Vicodin [hydrocodone-acetaminophen]  Medications: Prior to Admission medications   Medication Sig Start Date End Date Taking? Authorizing Provider  cyanocobalamin (,VITAMIN B-12,) 1000 MCG/ML injection Inject 1,000 mcg into the muscle every 30 (thirty) days.   Yes [provider]  DULoxetine (CYMBALTA) 30 MG capsule Take 1 capsule (30 mg total) by mouth daily. 10/16/18  Yes [name], MD  HYDROmorphone (DILAUDID) 2 MG tablet Take 2 tablets (4 mg total)  by mouth every 4 (four) hours as needed for severe pain. 11/06/18  Yes [name], MD  Multiple Vitamin (MULTIVITAMIN WITH MINERALS) TABS tablet Take 1 tablet by mouth daily.   Yes [provider]  pantoprazole (PROTONIX) 40 MG tablet TAKE 1 TABLET BY MOUTH EVERY DAY Patient taking differently: Take 40 mg by mouth daily.  11/06/18  Yes [name], MD  promethazine (PHENERGAN) 25 MG tablet Take 1 tablet  (25 mg total) by mouth every 6 (six) hours as needed for nausea or vomiting. 11/27/18  Yes [name], MD  dexamethasone (DECADRON) 2 MG tablet Take every other day for 1 week then stop. Patient not taking: Reported on 11/11/2018 10/16/18   [name], MD  dronabinol (MARINOL) 2.5 MG capsule Take 1 capsule (2.5 mg total) by mouth 2 (two) times daily before lunch and supper. 11/27/18   [name], MD  enoxaparin (LOVENOX) 60 MG/0.6ML injection Inject 0.6 mLs (60 mg total) into the skin every 12 (twelve) hours. 10/13/18 01/11/19  Dessa Phi, DO  fentaNYL (DURAGESIC) 100 MCG/HR Place 1 patch onto the skin every 3 (three) days. 11/27/18   [name], MD  lidocaine-prilocaine (EMLA) cream Apply 1 application topically as needed. Patient taking differently: Apply 1 application topically as needed (access).  08/05/18   [name], MD  LORazepam (ATIVAN) 0.5 MG tablet Place 1 tablet (0.5 mg total) under the tongue every 8 (eight) hours as needed (nausea). 11/27/18   [name], MD  magic mouthwash w/lidocaine SOLN Take 15 mLs by mouth 4 (four) times daily. 09/25/18   [name], MD  ondansetron (ZOFRAN) 4 MG tablet TAKE 1 TABLET BY MOUTH EVERY 6 HOURS AS NEEDED FOR NAUSEA 11/26/18   [name], MD  polyethylene glycol (MIRALAX / GLYCOLAX) 17 g packet Take 17 g by mouth 2 (two) times daily. Patient taking differently: Take 17 g by mouth daily as needed for mild constipation.  08/02/18   Sheikh, Georgina Quint Latif, DO  prochlorperazine (COMPAZINE) 10 MG tablet TAKE 1 TABLET BY MOUTH EVERY 6 HOURS AS NEEDED FOR NAUSEA AND VOMITING Patient taking differently: Take 10 mg by mouth every 6 (six) hours as needed for nausea or vomiting.  10/24/18   [name], MD  senna-docusate (SENOKOT-S) 8.6-50 MG tablet Take 2 tablets by mouth 2 (two) times daily. 08/02/18   Kerney Elbe, DO     Family History  Problem Relation Age of Onset  . Heart attack Father   . Hypertension  Father   . Diabetes Father   . Heart failure Father   . Hypertension Mother   . Gastric cancer Sister 22       d. 45  . Prostate cancer Brother 32  . Melanoma Maternal Aunt   . Stroke Maternal Uncle   . Stroke Paternal Uncle   . Melanoma Maternal Grandmother        dx under 47  . Colon cancer Maternal Grandmother   . Heart disease Maternal Grandfather   . Heart attack Paternal Grandfather     Social History   Socioeconomic History  . Marital status: Significant Other    Spouse name: Not on file  . Number of children: Not on file  . Years of education: Not on file  . Highest education level: Not on file  Occupational History  . Not on file  Social Needs  . Financial resource strain: Not on file  . Food insecurity  Worry: Not on file    Inability: Not on file  . Transportation needs    Medical: Not on file    Non-medical: Not on file  Tobacco Use  . Smoking status: Former Smoker    Packs/day: 2.00    Years: 8.00    Pack years: 16.00    Types: Cigarettes    Quit date: 05/29/2016    Years since quitting: 2.5  . Smokeless tobacco: Former Systems developer  . Tobacco comment: quti 2009  Substance and Sexual Activity  . Alcohol use: Not Currently  . Drug use: Not Currently    Types: Marijuana    Comment: last use was 05-29-2017  . Sexual activity: Yes  Lifestyle  . Physical activity    Days per week: Not on file    Minutes per session: Not on file  . Stress: Not on file  Relationships  . Social Herbalist on phone: Not on file    Gets together: Not on file    Attends religious service: Not on file    Active member of club or organization: Not on file    Attends meetings of clubs or organizations: Not on file    Relationship status: Not on file  Other Topics Concern  . Not on file  Social History Narrative  . Not on file     Review of Systems: A 12 point ROS discussed and pertinent positives are indicated in the HPI above.  All other systems are negative.   Review of Systems  Constitutional: Negative for appetite change, chills and fever.  Respiratory: Negative for cough and shortness of breath.   Cardiovascular: Negative for chest pain.  Gastrointestinal: Negative for abdominal pain, diarrhea, nausea and vomiting.  Musculoskeletal: Positive for back pain.  Skin: Negative for wound.  Neurological: Negative for dizziness and headaches.    Vital Signs: BP 110/88   Pulse (!) 121   Temp 98.2 F (36.8 C) (Skin)   Resp 16   Ht 6\' 2"  (1.88 m)   Wt 149 lb (67.6 kg)   SpO2 97%   BMI 19.13 kg/m   Physical Exam Vitals signs reviewed.  Constitutional:      General: He is not in acute distress.    Comments: Appears in pain  HENT:     Mouth/Throat:     Mouth: Mucous membranes are moist.     Pharynx: Oropharynx is clear. No oropharyngeal exudate or posterior oropharyngeal erythema.  Cardiovascular:     Rate and Rhythm: Regular rhythm. Tachycardia present.  Pulmonary:     Effort: Pulmonary effort is normal.     Breath sounds: Normal breath sounds.  Abdominal:     General: Bowel sounds are normal. There is no distension.     Palpations: Abdomen is soft.     Tenderness: There is no abdominal tenderness.  Skin:    General: Skin is warm and dry.  Neurological:     Mental Status: He is alert and oriented to person, place, and time.  Psychiatric:        Mood and Affect: Mood normal.        Behavior: Behavior normal.        Thought Content: Thought content normal.        Judgment: Judgment normal.      MD Evaluation Airway: WNL Heart: WNL Abdomen: WNL Chest/ Lungs: WNL ASA  Classification: 3 Mallampati/Airway Score: One   Imaging: Dg Abdomen 1 View  Result Date: 11/11/2018 CLINICAL DATA:  Nausea and vomiting. EXAM: ABDOMEN - 1 VIEW COMPARISON:  January 22, 2018 FINDINGS: The bowel gas pattern is nonobstructive and nonspecific. There is height loss of the T12, L1, and L2 vertebral bodies which is new from prior x-ray but likely  similar to prior CT dated 10/11/2018 IMPRESSION: Nonobstructive bowel gas pattern. Electronically Signed   By: Constance Holster M.D.   On: 11/11/2018 21:40   Ir Radiologist Eval & Mgmt  Result Date: 11/14/2018 Please refer to notes tab for details about interventional procedure. (Op Note)   Labs:  CBC: Recent Labs    11/13/18 0854 11/19/18 1220 11/27/18 0801 11/29/18 0737  WBC 2.7* 9.9 30.5* 16.9*  HGB 6.6* 9.5* 8.9* 9.4*  HCT 19.9* 29.1* 27.8* 30.7*  PLT 207 11* 364 409*    COAGS: Recent Labs    09/13/18 0432 10/11/18 2208 11/19/18 1220 11/29/18 0737  INR 1.2 1.1 1.0 1.1  APTT  --  30 34  --     BMP: Recent Labs    11/06/18 1202 11/11/18 2057 11/13/18 0854 11/27/18 0801  NA 138 136 137 136  K 4.3 4.5 4.3 4.7  CL 104 103 105 103  CO2 21* 24 21* 23  GLUCOSE 130* 109* 102* 109*  BUN 13 20 24* 10  CALCIUM 9.5 9.7 9.8 9.6  CREATININE 0.81 0.64 0.75 0.82  GFRNONAA >60 >60 >60 >60  GFRAA >60 >60 >60 >60    LIVER FUNCTION TESTS: Recent Labs    11/06/18 1202 11/11/18 2057 11/13/18 0854 11/27/18 0801  BILITOT <0.2* 0.5 0.3 <0.2*  AST 26 70* 51* 24  ALT 24 69* 61* 23  ALKPHOS 267* 169* 175* 222*  PROT 6.3* 7.0 6.6 6.4*  ALBUMIN 3.0* 3.6 3.1* 3.3*    TUMOR MARKERS: No results for input(s): AFPTM, CEA, CA199, CHROMGRNA in the last 8760 hours.  Assessment and Plan:  48 y/o M with history of prostate and urothelial cancer with bony metastasis followed by Dr. [name] who presents today for T 8/9 osteocool ablation and vertebral augmentation as previously discussed in consultation with Dr. Earleen Newport on 11/14/18.  Patient has been NPO since 9 pm last night, last dose of Lovenox 11/11. Afebrile, WBC 16.9 (receiveing Neulasta Onpro after each cycle of therapy), hgb 9.4, plt 409, INR 1.1.  Risks and benefits of thoracic 8 and 9 Osteocool ablation and vertebral augmentation were discussed with the patient including, but not limited to education regarding the  natural healing process of compression fractures without intervention, bleeding, infection, cement migration which may cause spinal cord damage, paralysis, pulmonary embolism or even death. This interventional procedure involves the use of X-rays and because of the nature of the planned procedure, it is possible that we will have prolonged use of X-ray fluoroscopy. Potential radiation risks to you include (but are not limited to) the following: - A slightly elevated risk for cancer  several years later in life. This risk is typically less than 0.5% percent. This risk is low in comparison to the normal incidence of human cancer, which is 33% for women and 50% for men according to the [employer]. - Radiation induced injury can include skin redness, resembling a rash, tissue breakdown / ulcers and hair loss (which can be temporary or permanent).  The likelihood of either of these occurring depends on the difficulty of the procedure and whether you are sensitive to radiation due to previous procedures, disease, or genetic conditions.  IF your procedure requires a prolonged use of radiation, you will  be notified and given written instructions for further action.  It is your responsibility to monitor the irradiated area for the 2 weeks following the procedure and to notify your physician if you are concerned that you have suffered a radiation induced injury.    All of the patient's questions were answered, patient is agreeable to proceed.  Consent signed and in chart.  Thank you for this interesting consult.  I greatly enjoyed meeting Rodney [name] and look forward to participating in their care.  A copy of this report was sent to the requesting provider on this date.  Electronically Signed: Joaquim Nam, PA-C 11/29/2018, 9:40 AM   I spent a total of  15 Minutes in face to face in clinical consultation, greater than 50% of which was counseling/coordinating care for T 8/9 osteocool  ablation and vertebral augmentation.

## 2018-11-29 NOTE — Progress Notes (Signed)
Rodney Ransom, Rn states Dr Earleen Newport is aware of heart rate

## 2018-11-29 NOTE — Procedures (Signed)
Interventional Radiology Procedure Note  Procedure: 2-level osteocool of T7 and T8 and KP, bi-pedicular approach, confirmed to be symptomatic under fluoroscopic evaluation and stimulation. .  Complications: None Recommendations:  - supine best rest x 3 hours - local wound care - Do not submerge for 7 days - Routine care  - advance diet - 3 hr dc home - follow up with Dr. Earleen Newport in ~4 weeks  Signed,  Dulcy Fanny. Earleen Newport, DO

## 2018-11-29 NOTE — Progress Notes (Signed)
Discharge instructions reviewed with pt and Rodney [name] both voice understanding.

## 2018-11-29 NOTE — Discharge Instructions (Signed)
Moderate Conscious Sedation, Adult, Care After These instructions provide you with information about caring for yourself after your procedure. Your health care provider may also give you more specific instructions. Your treatment has been planned according to current medical practices, but problems sometimes occur. Call your health care provider if you have any problems or questions after your procedure. What can I expect after the procedure? After your procedure, it is common:  To feel sleepy for several hours.  To feel clumsy and have poor balance for several hours.  To have poor judgment for several hours.  To vomit if you eat too soon. Follow these instructions at home: For at least 24 hours after the procedure:   Do not: ? Participate in activities where you could fall or become injured. ? Drive. ? Use heavy machinery. ? Drink alcohol. ? Take sleeping pills or medicines that cause drowsiness. ? Make important decisions or sign legal documents. ? Take care of children on your own.  Rest. Eating and drinking  Follow the diet recommended by your health care provider.  If you vomit: ? Drink water, juice, or soup when you can drink without vomiting. ? Make sure you have little or no nausea before eating solid foods. General instructions  Have a responsible adult stay with you until you are awake and alert.  Take over-the-counter and prescription medicines only as told by your health care provider.  If you smoke, do not smoke without supervision.  Keep all follow-up visits as told by your health care provider. This is important. Contact a health care provider if:  You keep feeling nauseous or you keep vomiting.  You feel light-headed.  You develop a rash.  You have a fever. Get help right away if:  You have trouble breathing. This information is not intended to replace advice given to you by your health care provider. Make sure you discuss any questions you have  with your health care provider. Document Released: 10/23/2012 Document Revised: 12/15/2016 Document Reviewed: 04/24/2015 Elsevier Patient Education  2020 [place].   Balloon Kyphoplasty Balloon kyphoplasty is a procedure to treat a spinal compression fracture, which is a collapse of the bones that form the spine (vertebrae). With this type of fracture, the vertebrae are squeezed (compressed) into a wedge shape, and this causes pain. In this procedure, the collapsed vertebrae are expanded with a balloon, and bone cement is injected into the vertebrae to strengthen them. Tell a health care provider about:  Any allergies you have.  All medicines you are taking, including vitamins, herbs, eye drops, creams, and over-the-counter medicines.  Any problems you or family members have had with anesthetic medicines.  Any blood disorders you have.  Any surgeries you have had.  Any medical conditions you have or have had.  Whether you are pregnant or may be pregnant. What are the risks? Generally, this is a safe procedure. However, problems may occur, including:  Infection.  Bleeding.  Increased back pain.  Allergic reactions to medicines.  Damage to other structures, nerves, or organs.  Leaking of bone cement into other parts of the body. What happens before the procedure? Medicines Ask your health care provider about:  Changing or stopping your regular medicines. This is especially important if you are taking diabetes medicines or blood thinners.  Taking medicines such as aspirin and ibuprofen. These medicines can thin your blood. Do not take these medicines unless your health care provider tells you to take them.  Taking over-the-counter medicines, vitamins, herbs,  and supplements. General instructions  Follow instructions from your health care provider about eating or drinking restrictions.  Do not use any products that contain nicotine or tobacco for at least 4 weeks  before the procedure. These products include cigarettes, e-cigarettes, and chewing tobacco. If you need help quitting, ask your health care provider.  Ask your health care provider: ? How your surgery site will be marked. ? What steps will be taken to help prevent infection. These may include:  Removing hair at the surgery site.  Washing skin with a germ-killing soap.  Taking antibiotic medicine.  Plan to have someone take you home from the hospital or clinic.  If you will be going home right after the procedure, plan to have someone with you for 24 hours. What happens during the procedure?   An IV will be inserted into one of your veins.  You will be given one or more of the following: ? A medicine to help you relax (sedative). ? A medicine to numb the area (local anesthetic). ? A medicine to make you fall asleep (general anesthetic).  Your surgeon will use an X-ray machine to see your spinal compression fracture.  A hollow needle will be inserted through the skin and into the spine.  Through this needle, the balloon will be placed in your spine where the fractures are located.  The balloon will be inflated. This will create space and push the bone back toward its normal height and shape.  The balloon will be removed.  The newly created space in your spine will be filled with bone cement.  When the cement hardens, the hollow needle will be removed.  The needle puncture site will be closed with skin glue or adhesive tape.  A bandage (dressing) may be used to cover the needle puncture site. The procedure may vary among health care providers and hospitals. What happens after the procedure?  Your blood pressure, heart rate, breathing rate, and blood oxygen level will be monitored until you leave the hospital or clinic.  You will have some pain. Pain medicine will be available to help you.  An ice pack may be placed over the needle site.  Do not drive for 24 hours if  you were given a sedative during your procedure. Summary  Balloon kyphoplasty is a procedure to treat a spinal compression fracture, which is a collapse of the bones that form the spine (vertebrae).  Before the procedure, follow instructions from your health care provider about eating or drinking restrictions.  Ask your health care provider about changing or stopping your regular medicines. This is especially important if you are taking diabetes medicines or blood thinners.  Plan to have someone take you home from the hospital or clinic. Do not drive for 24 hours if you were given a sedative during your procedure.  If you will be going home right after the procedure, plan to have someone with you for 24 hours. This information is not intended to replace advice given to you by your health care provider. Make sure you discuss any questions you have with your health care provider. Document Released: 12/09/2003 Document Revised: 12/10/2017 Document Reviewed: 12/10/2017 Elsevier Patient Education  2020 [employer].

## 2018-12-02 ENCOUNTER — Encounter (HOSPITAL_COMMUNITY): Payer: Self-pay

## 2018-12-02 ENCOUNTER — Other Ambulatory Visit (HOSPITAL_COMMUNITY): Payer: Self-pay | Admitting: Interventional Radiology

## 2018-12-02 DIAGNOSIS — C7951 Secondary malignant neoplasm of bone: Secondary | ICD-10-CM

## 2018-12-04 ENCOUNTER — Inpatient Hospital Stay: Payer: PRIVATE HEALTH INSURANCE

## 2018-12-04 ENCOUNTER — Telehealth: Payer: Self-pay | Admitting: Radiation Oncology

## 2018-12-04 ENCOUNTER — Telehealth: Payer: Self-pay | Admitting: *Deleted

## 2018-12-04 ENCOUNTER — Other Ambulatory Visit: Payer: Self-pay

## 2018-12-04 ENCOUNTER — Telehealth: Payer: Self-pay

## 2018-12-04 DIAGNOSIS — C7951 Secondary malignant neoplasm of bone: Secondary | ICD-10-CM

## 2018-12-04 DIAGNOSIS — Z5111 Encounter for antineoplastic chemotherapy: Secondary | ICD-10-CM | POA: Diagnosis not present

## 2018-12-04 DIAGNOSIS — Z95828 Presence of other vascular implants and grafts: Secondary | ICD-10-CM

## 2018-12-04 DIAGNOSIS — C61 Malignant neoplasm of prostate: Secondary | ICD-10-CM

## 2018-12-04 DIAGNOSIS — D6481 Anemia due to antineoplastic chemotherapy: Secondary | ICD-10-CM

## 2018-12-04 DIAGNOSIS — T451X5A Adverse effect of antineoplastic and immunosuppressive drugs, initial encounter: Secondary | ICD-10-CM

## 2018-12-04 LAB — CMP (CANCER CENTER ONLY)
ALT: 23 U/L (ref 0–44)
AST: 27 U/L (ref 15–41)
Albumin: 3.1 g/dL — ABNORMAL LOW (ref 3.5–5.0)
Alkaline Phosphatase: 143 U/L — ABNORMAL HIGH (ref 38–126)
Anion gap: 10 (ref 5–15)
BUN: 20 mg/dL (ref 6–20)
CO2: 21 mmol/L — ABNORMAL LOW (ref 22–32)
Calcium: 9.3 mg/dL (ref 8.9–10.3)
Chloride: 107 mmol/L (ref 98–111)
Creatinine: 0.74 mg/dL (ref 0.61–1.24)
GFR, Est AFR Am: >60 mL/min (ref >60)
GFR, Estimated: >60 mL/min (ref >60)
Glucose, Bld: 99 mg/dL (ref 70–99)
Potassium: 4.9 mmol/L (ref 3.5–5.1)
Sodium: 138 mmol/L (ref 135–145)
Total Bilirubin: <0.2 mg/dL — ABNORMAL LOW (ref 0.3–1.2)
Total Protein: 6.5 g/dL (ref 6.5–8.1)

## 2018-12-04 LAB — CBC WITH DIFFERENTIAL (CANCER CENTER ONLY)
Abs Immature Granulocytes: 0.08 10*3/uL — ABNORMAL HIGH (ref 0.00–0.07)
Basophils Absolute: 0 10*3/uL (ref 0.0–0.1)
Basophils Relative: 1 %
Eosinophils Absolute: 0 10*3/uL (ref 0.0–0.5)
Eosinophils Relative: 0 %
HCT: 20.8 % — ABNORMAL LOW (ref 39.0–52.0)
Hemoglobin: 6.8 g/dL — CL (ref 13.0–17.0)
Immature Granulocytes: 2 %
Lymphocytes Relative: 7 %
Lymphs Abs: 0.4 10*3/uL — ABNORMAL LOW (ref 0.7–4.0)
MCH: 32.2 pg (ref 26.0–34.0)
MCHC: 32.7 g/dL (ref 30.0–36.0)
MCV: 98.6 fL (ref 80.0–100.0)
Monocytes Absolute: 0.3 10*3/uL (ref 0.1–1.0)
Monocytes Relative: 6 %
Neutro Abs: 4.2 10*3/uL (ref 1.7–7.7)
Neutrophils Relative %: 84 %
Platelet Count: 158 10*3/uL (ref 150–400)
RBC: 2.11 MIL/uL — ABNORMAL LOW (ref 4.22–5.81)
RDW: 17.6 % — ABNORMAL HIGH (ref 11.5–15.5)
WBC Count: 5 10*3/uL (ref 4.0–10.5)
nRBC: 0 % (ref 0.0–0.2)

## 2018-12-04 LAB — PREPARE RBC (CROSSMATCH)

## 2018-12-04 MED ORDER — HEPARIN SOD (PORK) LOCK FLUSH 100 UNIT/ML IV SOLN
500.0000 [IU] | Freq: Every day | INTRAVENOUS | Status: AC | PRN
  Filled 2018-12-04: qty 5

## 2018-12-04 MED ORDER — DIPHENHYDRAMINE HCL 25 MG PO CAPS
ORAL_CAPSULE | ORAL | Status: AC
  Filled 2018-12-04: qty 1

## 2018-12-04 MED ORDER — ACETAMINOPHEN 325 MG PO TABS
ORAL_TABLET | ORAL | Status: AC
  Filled 2018-12-04: qty 2

## 2018-12-04 MED ORDER — SODIUM CHLORIDE 0.9% FLUSH
10.0000 mL | INTRAVENOUS | Status: AC | PRN
  Administered 2018-12-04: 10 mL
  Filled 2018-12-04: qty 10

## 2018-12-04 MED ORDER — SODIUM CHLORIDE 0.9% FLUSH
10.0000 mL | Freq: Once | INTRAVENOUS | Status: DC
  Filled 2018-12-04: qty 10

## 2018-12-04 MED ORDER — HEPARIN SOD (PORK) LOCK FLUSH 100 UNIT/ML IV SOLN
500.0000 [IU] | Freq: Every day | INTRAVENOUS | Status: AC | PRN
  Administered 2018-12-04: 500 [IU]
  Filled 2018-12-04: qty 5

## 2018-12-04 MED ORDER — ACETAMINOPHEN 325 MG PO TABS
650.0000 mg | ORAL_TABLET | Freq: Once | ORAL | Status: AC
  Administered 2018-12-04: 650 mg via ORAL

## 2018-12-04 MED ORDER — SODIUM CHLORIDE 0.9% IV SOLUTION
250.0000 mL | Freq: Once | INTRAVENOUS | Status: AC
  Administered 2018-12-04: 250 mL via INTRAVENOUS
  Filled 2018-12-04: qty 250

## 2018-12-04 MED ORDER — DIPHENHYDRAMINE HCL 25 MG PO CAPS
25.0000 mg | ORAL_CAPSULE | Freq: Once | ORAL | Status: AC
  Administered 2018-12-04: 25 mg via ORAL

## 2018-12-04 NOTE — Telephone Encounter (Signed)
Received call from lab with panic level HGB of 6.8 Results given to Dr. [name].

## 2018-12-04 NOTE — Telephone Encounter (Signed)
-----   Message from [name], MD sent at 12/04/2018  1:54 PM EST ----- No chemo today. One unit of PRBC before the end of the week.

## 2018-12-04 NOTE — Telephone Encounter (Signed)
I called and LM for the patient to check on how he's doing since his vertebral augmentation procedure last Friday, and encouraged him to call us back to discuss.

## 2018-12-04 NOTE — Patient Instructions (Signed)
Blood Transfusion, Adult, Care After This sheet gives you information about how to care for yourself after your procedure. Your doctor may also give you more specific instructions. If you have problems or questions, contact your doctor. Follow these instructions at home:   Take over-the-counter and prescription medicines only as told by your doctor.  Go back to your normal activities as told by your doctor.  Follow instructions from your doctor about how to take care of the area where an IV tube was put into your vein (insertion site). Make sure you: ? Wash your hands with soap and water before you change your bandage (dressing). If there is no soap and water, use hand sanitizer. ? Change your bandage as told by your doctor.  Check your IV insertion site every day for signs of infection. Check for: ? More redness, swelling, or pain. ? More fluid or blood. ? Warmth. ? Pus or a bad smell. Contact a doctor if:  You have more redness, swelling, or pain around the IV insertion site.  You have more fluid or blood coming from the IV insertion site.  Your IV insertion site feels warm to the touch.  You have pus or a bad smell coming from the IV insertion site.  Your pee (urine) turns pink, red, or brown.  You feel weak after doing your normal activities. Get help right away if:  You have signs of a serious allergic or body defense (immune) system reaction, including: ? Itchiness. ? Hives. ? Trouble breathing. ? Anxiety. ? Pain in your chest or lower back. ? Fever, flushing, and chills. ? Fast pulse. ? Rash. ? Watery poop (diarrhea). ? Throwing up (vomiting). ? Dark pee. ? Serious headache. ? Dizziness. ? Stiff neck. ? Yellow color in your face or the white parts of your eyes (jaundice). Summary  After a blood transfusion, return to your normal activities as told by your doctor.  Every day, check for signs of infection where the IV tube was put into your vein.  Some  signs of infection are warm skin, more redness and pain, more fluid or blood, and pus or a bad smell where the needle went in.  Contact your doctor if you feel weak or have any unusual symptoms. This information is not intended to replace advice given to you by your health care provider. Make sure you discuss any questions you have with your health care provider. Document Released: 01/23/2014 Document Revised: 05/09/2017 Document Reviewed: 08/27/2015 Elsevier Patient Education  2020 Elsevier Inc.  

## 2018-12-05 ENCOUNTER — Encounter (HOSPITAL_COMMUNITY): Payer: Self-pay | Admitting: Interventional Radiology

## 2018-12-05 LAB — TYPE AND SCREEN
ABO/RH(D): O POS
Antibody Screen: NEGATIVE
Unit division: 0

## 2018-12-05 LAB — BPAM RBC
Blood Product Expiration Date: 202012202359
ISSUE DATE / TIME: 202011181557
Unit Type and Rh: 5100

## 2018-12-09 ENCOUNTER — Other Ambulatory Visit: Payer: Self-pay | Admitting: Oncology

## 2018-12-10 ENCOUNTER — Telehealth: Payer: Self-pay | Admitting: Oncology

## 2018-12-10 NOTE — Telephone Encounter (Signed)
Called patient regarding providers request, patient agreed to do virtual visit on 12/04.

## 2018-12-13 ENCOUNTER — Other Ambulatory Visit: Payer: Self-pay | Admitting: Oncology

## 2018-12-13 MED ORDER — HYDROMORPHONE HCL 2 MG PO TABS: 4.0000 mg | ORAL_TABLET | ORAL | 0 refills | Status: DC | PRN

## 2018-12-17 ENCOUNTER — Other Ambulatory Visit: Payer: Self-pay

## 2018-12-17 ENCOUNTER — Inpatient Hospital Stay: Payer: PRIVATE HEALTH INSURANCE | Attending: Oncology

## 2018-12-17 ENCOUNTER — Inpatient Hospital Stay: Payer: PRIVATE HEALTH INSURANCE

## 2018-12-17 DIAGNOSIS — D701 Agranulocytosis secondary to cancer chemotherapy: Secondary | ICD-10-CM | POA: Diagnosis not present

## 2018-12-17 DIAGNOSIS — Z8546 Personal history of malignant neoplasm of prostate: Secondary | ICD-10-CM | POA: Diagnosis not present

## 2018-12-17 DIAGNOSIS — C7951 Secondary malignant neoplasm of bone: Secondary | ICD-10-CM | POA: Insufficient documentation

## 2018-12-17 DIAGNOSIS — Z79899 Other long term (current) drug therapy: Secondary | ICD-10-CM | POA: Insufficient documentation

## 2018-12-17 DIAGNOSIS — Z7901 Long term (current) use of anticoagulants: Secondary | ICD-10-CM | POA: Diagnosis not present

## 2018-12-17 DIAGNOSIS — C7800 Secondary malignant neoplasm of unspecified lung: Secondary | ICD-10-CM | POA: Insufficient documentation

## 2018-12-17 DIAGNOSIS — Z86711 Personal history of pulmonary embolism: Secondary | ICD-10-CM | POA: Diagnosis not present

## 2018-12-17 DIAGNOSIS — Z452 Encounter for adjustment and management of vascular access device: Secondary | ICD-10-CM | POA: Diagnosis present

## 2018-12-17 DIAGNOSIS — Z923 Personal history of irradiation: Secondary | ICD-10-CM | POA: Insufficient documentation

## 2018-12-17 DIAGNOSIS — C679 Malignant neoplasm of bladder, unspecified: Secondary | ICD-10-CM | POA: Diagnosis present

## 2018-12-17 DIAGNOSIS — E079 Disorder of thyroid, unspecified: Secondary | ICD-10-CM | POA: Diagnosis not present

## 2018-12-17 DIAGNOSIS — C61 Malignant neoplasm of prostate: Secondary | ICD-10-CM

## 2018-12-17 DIAGNOSIS — Z86718 Personal history of other venous thrombosis and embolism: Secondary | ICD-10-CM | POA: Diagnosis not present

## 2018-12-17 DIAGNOSIS — Z9079 Acquired absence of other genital organ(s): Secondary | ICD-10-CM | POA: Insufficient documentation

## 2018-12-17 DIAGNOSIS — Z95828 Presence of other vascular implants and grafts: Secondary | ICD-10-CM

## 2018-12-17 LAB — CBC WITH DIFFERENTIAL (CANCER CENTER ONLY)
Abs Immature Granulocytes: 0.04 10*3/uL (ref 0.00–0.07)
Basophils Absolute: 0 10*3/uL (ref 0.0–0.1)
Basophils Relative: 1 %
Eosinophils Absolute: 0.2 10*3/uL (ref 0.0–0.5)
Eosinophils Relative: 6 %
HCT: 29 % — ABNORMAL LOW (ref 39.0–52.0)
Hemoglobin: 9.4 g/dL — ABNORMAL LOW (ref 13.0–17.0)
Immature Granulocytes: 1 %
Lymphocytes Relative: 29 %
Lymphs Abs: 0.8 10*3/uL (ref 0.7–4.0)
MCH: 31.9 pg (ref 26.0–34.0)
MCHC: 32.4 g/dL (ref 30.0–36.0)
MCV: 98.3 fL (ref 80.0–100.0)
Monocytes Absolute: 0.7 10*3/uL (ref 0.1–1.0)
Monocytes Relative: 24 %
Neutro Abs: 1.1 10*3/uL — ABNORMAL LOW (ref 1.7–7.7)
Neutrophils Relative %: 39 %
Platelet Count: 386 10*3/uL (ref 150–400)
RBC: 2.95 MIL/uL — ABNORMAL LOW (ref 4.22–5.81)
RDW: 20.5 % — ABNORMAL HIGH (ref 11.5–15.5)
WBC Count: 2.8 10*3/uL — ABNORMAL LOW (ref 4.0–10.5)
nRBC: 0 % (ref 0.0–0.2)

## 2018-12-17 LAB — CMP (CANCER CENTER ONLY)
ALT: 20 U/L (ref 0–44)
AST: 21 U/L (ref 15–41)
Albumin: 3.5 g/dL (ref 3.5–5.0)
Alkaline Phosphatase: 130 U/L — ABNORMAL HIGH (ref 38–126)
Anion gap: 8 (ref 5–15)
BUN: 11 mg/dL (ref 6–20)
CO2: 24 mmol/L (ref 22–32)
Calcium: 9.9 mg/dL (ref 8.9–10.3)
Chloride: 108 mmol/L (ref 98–111)
Creatinine: 0.76 mg/dL (ref 0.61–1.24)
GFR, Est AFR Am: >60 mL/min (ref >60)
GFR, Estimated: >60 mL/min (ref >60)
Glucose, Bld: 87 mg/dL (ref 70–99)
Potassium: 4.7 mmol/L (ref 3.5–5.1)
Sodium: 140 mmol/L (ref 135–145)
Total Bilirubin: <0.2 mg/dL — ABNORMAL LOW (ref 0.3–1.2)
Total Protein: 6.5 g/dL (ref 6.5–8.1)

## 2018-12-17 MED ORDER — SODIUM CHLORIDE 0.9% FLUSH
10.0000 mL | Freq: Once | INTRAVENOUS | Status: AC
  Administered 2018-12-17: 10 mL
  Filled 2018-12-17: qty 10

## 2018-12-17 MED ORDER — HEPARIN SOD (PORK) LOCK FLUSH 100 UNIT/ML IV SOLN
500.0000 [IU] | Freq: Once | INTRAVENOUS | Status: AC
  Administered 2018-12-17: 500 [IU]
  Filled 2018-12-17: qty 5

## 2018-12-18 ENCOUNTER — Ambulatory Visit (HOSPITAL_COMMUNITY)
Admission: RE | Admit: 2018-12-18 | Discharge: 2018-12-18 | Disposition: A | Discharge status: Home / Self Care | Payer: PRIVATE HEALTH INSURANCE | Source: Ambulatory Visit | Attending: Oncology | Admitting: Oncology

## 2018-12-18 DIAGNOSIS — C801 Malignant (primary) neoplasm, unspecified: Secondary | ICD-10-CM

## 2018-12-18 DIAGNOSIS — C7951 Secondary malignant neoplasm of bone: Secondary | ICD-10-CM | POA: Diagnosis present

## 2018-12-18 MED ORDER — SODIUM CHLORIDE (PF) 0.9 % IJ SOLN
INTRAMUSCULAR | Status: AC
  Filled 2018-12-18: qty 50

## 2018-12-18 MED ORDER — HEPARIN SOD (PORK) LOCK FLUSH 100 UNIT/ML IV SOLN
INTRAVENOUS | Status: AC
  Administered 2018-12-18: 500 [IU] via INTRAVENOUS
  Filled 2018-12-18: qty 5

## 2018-12-18 MED ORDER — HEPARIN SOD (PORK) LOCK FLUSH 100 UNIT/ML IV SOLN
500.0000 [IU] | Freq: Once | INTRAVENOUS | Status: AC
  Administered 2018-12-18: 09:00:00 500 [IU] via INTRAVENOUS

## 2018-12-18 MED ORDER — IOHEXOL 300 MG/ML  SOLN
100.0000 mL | Freq: Once | INTRAMUSCULAR | Status: AC | PRN
  Administered 2018-12-18: 100 mL via INTRAVENOUS

## 2018-12-20 ENCOUNTER — Inpatient Hospital Stay (HOSPITAL_BASED_OUTPATIENT_CLINIC_OR_DEPARTMENT_OTHER): Payer: PRIVATE HEALTH INSURANCE | Admitting: Oncology

## 2018-12-20 ENCOUNTER — Ambulatory Visit: Payer: PRIVATE HEALTH INSURANCE | Admitting: Oncology

## 2018-12-20 DIAGNOSIS — Z86718 Personal history of other venous thrombosis and embolism: Secondary | ICD-10-CM

## 2018-12-20 DIAGNOSIS — C7951 Secondary malignant neoplasm of bone: Secondary | ICD-10-CM

## 2018-12-20 DIAGNOSIS — C679 Malignant neoplasm of bladder, unspecified: Secondary | ICD-10-CM

## 2018-12-20 DIAGNOSIS — C801 Malignant (primary) neoplasm, unspecified: Secondary | ICD-10-CM

## 2018-12-20 DIAGNOSIS — C7800 Secondary malignant neoplasm of unspecified lung: Secondary | ICD-10-CM | POA: Diagnosis not present

## 2018-12-20 DIAGNOSIS — Z7901 Long term (current) use of anticoagulants: Secondary | ICD-10-CM

## 2018-12-20 NOTE — Progress Notes (Signed)
DISCONTINUE OFF PATHWAY REGIMEN - Bladder   [id number]:Carboplatin + Gemcitabine (05/998) q21 Days:   A cycle is every 21 days:     Carboplatin      Gemcitabine   **Always confirm dose/schedule in your pharmacy ordering system**  REASON: Disease Progression PRIOR TREATMENT: Off Pathway: Carboplatin + Gemcitabine (05/998) q21 Days TREATMENT RESPONSE: Partial Response (PR)  START ON PATHWAY REGIMEN - Bladder     A cycle is 21 days:     Pembrolizumab   **Always confirm dose/schedule in your pharmacy ordering system**  Patient Characteristics: Advanced/Metastatic Disease, Second Line, FGFR2/FGFR3 Mutation Negative or Unknown, Prior Platinum-Based Therapy and No Prior PD-1/PD-L1 Inhibitor Therapeutic Status: Advanced/Metastatic Disease Line of Therapy: Second Line FGFR2/FGFR3 Mutation Status: Did Not Order Test Intent of Therapy: Non-Curative / Palliative Intent, Discussed with Patient

## 2018-12-20 NOTE — Progress Notes (Signed)
Hematology and Oncology Follow Up for Telemedicine Visits  Rodney [name] [id number] 12/22/1970 48 y.o. 12/20/2018 3:00 PM Rodney [name], MDBurdine, [name], MD   I connected with Rodney [name] on 12/20/18 at  3:30 PM EST by video enabled telemedicine visit and verified that I am speaking with the correct person using two identifiers.   I discussed the limitations, risks, security and privacy concerns of performing an evaluation and management service by telemedicine and the availability of in-person appointments. I also discussed with the patient that there may be a patient responsible charge related to this service. The patient expressed understanding and agreed to proceed.  Other persons participating in the visit and their role in the encounter:    Patient's location: Home Provider's location: Office   Principle Diagnosis:  48 year old man with:  1.    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  He was initially diagnosed with localized disease in 2019.  2.  Prostate cancer diagnosed in 2019.  He is status post prostatectomy for Gleason score 6 disease in 2019.   Prior Therapy:  He is status post a robotic assisted laparoscopic radical cystoprostatectomy and bilateral pelvic lymphadenectomy and neobladder creation completed on November 08, 2017 by Dr. Alinda [name].  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  Prostate adenocarcinoma is 3+4 = 7 with 0 out of 14 lymph nodes noted.  He is status post radiation to the cervical and thoracic spine for a total of 30 Gy in 10 fractions completed on August 06, 2018.  He is status post 6 cycles of platinum and gemcitabine chemotherapy concluded in November 2020.  He initially was treated with cisplatin and switched to carboplatin for better tolerance.  Current therapy: Under evaluation for switching to maintenance immunotherapy.  Interim History: Rodney [name] reports no major changes in  his health.  He underwent interventional radiology procedure including osteocool ablation of T7 and T8 and kyphoplasty on November 29, 2018.  He did require packed red cell transfusion for hemoglobin of 6.8 and did not receive day 8 of the last cycle.  Since his procedure, he reports no major changes in his pain level although he has not been using fentanyl patch anymore and uses Dilaudid for breakthrough as needed.  He denies any residual nausea, vomiting or bleeding complications at this time.  Performance status and quality of life remains unchanged.  .    Medications: I have reviewed the patient's current medications.  Current Outpatient Medications  Medication Sig Dispense Refill  . cyanocobalamin (,VITAMIN B-12,) 1000 MCG/ML injection Inject 1,000 mcg into the muscle every 30 (thirty) days.    [name] dexamethasone (DECADRON) 2 MG tablet Take every other day for 1 week then stop. (Patient not taking: Reported on 11/11/2018) 20 tablet 0  . dronabinol (MARINOL) 2.5 MG capsule Take 1 capsule (2.5 mg total) by mouth 2 (two) times daily before lunch and supper. 30 capsule 0  . DULoxetine (CYMBALTA) 30 MG capsule Take 1 capsule (30 mg total) by mouth daily. 90 capsule 3  . enoxaparin (LOVENOX) 60 MG/0.6ML injection Inject 0.6 mLs (60 mg total) into the skin every 12 (twelve) hours. 36 mL 2  . fentaNYL (DURAGESIC) 100 MCG/HR Place 1 patch onto the skin every 3 (three) days. 10 patch 0  . HYDROmorphone (DILAUDID) 2 MG tablet Take 2 tablets (4 mg total) by mouth every 4 (four) hours as needed for severe pain. 90 tablet 0  . lidocaine-prilocaine (  EMLA) cream Apply 1 application topically as needed. (Patient taking differently: Apply 1 application topically as needed (access). ) 30 g 0  . LORazepam (ATIVAN) 0.5 MG tablet Place 1 tablet (0.5 mg total) under the tongue every 8 (eight) hours as needed (nausea). 35 tablet 0  . magic mouthwash w/lidocaine SOLN Take 15 mLs by mouth 4 (four) times daily. 400 mL 0  .  Multiple Vitamin (MULTIVITAMIN WITH MINERALS) TABS tablet Take 1 tablet by mouth daily.    . ondansetron (ZOFRAN) 4 MG tablet TAKE 1 TABLET BY MOUTH EVERY 6 HOURS AS NEEDED FOR NAUSEA 20 tablet 0  . pantoprazole (PROTONIX) 40 MG tablet TAKE 1 TABLET BY MOUTH EVERY DAY 30 tablet 0  . polyethylene glycol (MIRALAX / GLYCOLAX) 17 g packet Take 17 g by mouth 2 (two) times daily. (Patient taking differently: Take 17 g by mouth daily as needed for mild constipation. ) 14 each 0  . prochlorperazine (COMPAZINE) 10 MG tablet TAKE 1 TABLET BY MOUTH EVERY 6 HOURS AS NEEDED FOR NAUSEA AND VOMITING (Patient taking differently: Take 10 mg by mouth every 6 (six) hours as needed for nausea or vomiting. ) 30 tablet 0  . promethazine (PHENERGAN) 25 MG tablet TAKE 1 TABLET BY MOUTH EVERY 6 HOURS AS NEEDED FOR NAUSEA OR VOMITING 30 tablet 0  . senna-docusate (SENOKOT-S) 8.6-50 MG tablet Take 2 tablets by mouth 2 (two) times daily. 60 tablet 0   No current facility-administered medications for this visit.      Allergies:  Allergies  Allergen Reactions  . Morphine And Related Itching  . Vicodin [Hydrocodone-Acetaminophen] Itching    Past Medical History, Surgical history, Social history, and Family History updated without any changes.   Lab Results: Lab Results  Component Value Date   WBC 2.8 (L) 12/17/2018   HGB 9.4 (L) 12/17/2018   HCT 29.0 (L) 12/17/2018   MCV 98.3 12/17/2018   PLT 386 12/17/2018     Chemistry      Component Value Date/Time   NA 140 12/17/2018 0918   K 4.7 12/17/2018 0918   CL 108 12/17/2018 0918   CO2 24 12/17/2018 0918   BUN 11 12/17/2018 0918   CREATININE 0.76 12/17/2018 0918      Component Value Date/Time   CALCIUM 9.9 12/17/2018 0918   ALKPHOS 130 (H) 12/17/2018 0918   AST 21 12/17/2018 0918   ALT 20 12/17/2018 0918   BILITOT <0.2 (L) 12/17/2018 0918       Radiological Studies:  IMPRESSION: 1. Widespread mixed lytic and sclerotic osseous metastases are  all stable to decreased in size and demonstrate increased sclerosis, compatible with treatment response. No new or enlarging bone metastases. New mild T6 vertebral compression fracture. 2. New small left adrenal nodule and newly mildly enlarged right external iliac lymph node, suspicious for new sites of metastatic disease. Close CT surveillance advised. 3. Small pericardial effusions, slightly increased. 4.  Aortic Atherosclerosis (ICD10-I70.0).  Impression and Plan:  48 year old with:   1.    Stage IV bladder cancer with bone and lung metastasis documented in 2020.  He completed 6 cycles of chemotherapy utilizing carboplatin (the first cycle with cisplatin) with gemcitabine.  CT scan obtained on December 18, 2018 was personally reviewed and showed continued objective response with improvement in his bone lesions.  The natural course of this disease was reviewed at this time.  Treatment options were reiterated.  These options would include active surveillance versus switching to immunotherapy given his still high volume  residual disease.  Risks and benefits of immunotherapy was reviewed today.  These would include pneumonitis, colitis, arthritis and fatigue.   After discussion today he is agreeable to proceed.  2. IV access: Port-A-Cath will continue to be in use for future cancer treatment.  3. Antiemetics:  Anticipate less nausea and vomiting in the future.  4. Goals of care:Therapy remains palliative although aggressive measures are warranted given his reasonable performance status.   5.  Chronic pain: He is status post interventional radiology procedure including osteocool and kyphoplasty.  He still on Dilaudid and fentanyl.  6.  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  I will continue to educate him about these issues moving forward.  7.  Deep vein thrombosis and pulmonary embolism: I recommended continuing  Lovenox indefinitely.  8.  Neutropenia: Related to chemotherapy and resolving at this time.  9.  Follow-up: Will be in the near future to receive the first cycle of Pembrolizumab.  I discussed the assessment and treatment plan with the patient. The patient was provided an opportunity to ask questions and all were answered. The patient agreed with the plan and demonstrated an understanding of the instructions.   The patient was advised to call back or seek an in-person evaluation if the symptoms worsen or if the condition fails to improve as anticipated.  I provided 25 minutes of face-to-face video visit time during this encounter, and > 50% was dedicated to updating his disease status including reviewing imaging studies, laboratory data as well as treatment options for the future.  Zola Button, MD 12/20/2018 3:00 PM

## 2018-12-23 ENCOUNTER — Telehealth: Payer: Self-pay | Admitting: Oncology

## 2018-12-23 NOTE — Telephone Encounter (Signed)
Scheduled appt per 12/4 los.  Spoke with pt and he is aware of his appt date and time

## 2018-12-26 ENCOUNTER — Other Ambulatory Visit: Payer: Self-pay

## 2018-12-26 MED ORDER — PROCHLORPERAZINE MALEATE 10 MG PO TABS: ORAL_TABLET | ORAL | 0 refills | Status: DC

## 2018-12-26 MED ORDER — PANTOPRAZOLE SODIUM 40 MG PO TBEC: 40.0000 mg | DELAYED_RELEASE_TABLET | Freq: Every day | ORAL | 0 refills | Status: DC

## 2018-12-27 ENCOUNTER — Other Ambulatory Visit: Payer: Self-pay | Admitting: Oncology

## 2018-12-28 ENCOUNTER — Other Ambulatory Visit: Payer: Self-pay | Admitting: Oncology

## 2018-12-31 ENCOUNTER — Telehealth: Payer: Self-pay

## 2018-12-31 NOTE — Telephone Encounter (Signed)
Spoke to patient's friend [name] and made her aware that Dr. [name] has been made aware and will evaluate at the next visit. She verbalized understanding.

## 2018-12-31 NOTE — Telephone Encounter (Signed)
-----   Message from [name], MD sent at 12/31/2018 12:10 PM EST ----- Noted. I will evaluate next visit. Thanks.  ----- Message ----- From: [name], RN Sent: 12/31/2018  12:02 PM EST To: [name], MD  Patient's friend called and wants to make you aware that patient has "quarter size knots on each side of his rib cage that have gotten bigger over the last week". She states patient continues to have pain and she wants to make sure you are aware of the new raised areas on right and left rib area.  Lanelle Bal

## 2019-01-02 ENCOUNTER — Inpatient Hospital Stay: Payer: PRIVATE HEALTH INSURANCE

## 2019-01-02 ENCOUNTER — Other Ambulatory Visit: Payer: Self-pay

## 2019-01-02 VITALS — BP 100/69 | HR 95 | Temp 98.7°F | Resp 18

## 2019-01-02 DIAGNOSIS — C7951 Secondary malignant neoplasm of bone: Secondary | ICD-10-CM

## 2019-01-02 DIAGNOSIS — Z452 Encounter for adjustment and management of vascular access device: Secondary | ICD-10-CM | POA: Diagnosis not present

## 2019-01-02 DIAGNOSIS — Z95828 Presence of other vascular implants and grafts: Secondary | ICD-10-CM

## 2019-01-02 DIAGNOSIS — C679 Malignant neoplasm of bladder, unspecified: Secondary | ICD-10-CM

## 2019-01-02 LAB — CMP (CANCER CENTER ONLY)
ALT: 20 U/L (ref 0–44)
AST: 22 U/L (ref 15–41)
Albumin: 3.6 g/dL (ref 3.5–5.0)
Alkaline Phosphatase: 135 U/L — ABNORMAL HIGH (ref 38–126)
Anion gap: 10 (ref 5–15)
BUN: 19 mg/dL (ref 6–20)
CO2: 23 mmol/L (ref 22–32)
Calcium: 9.6 mg/dL (ref 8.9–10.3)
Chloride: 105 mmol/L (ref 98–111)
Creatinine: 0.85 mg/dL (ref 0.61–1.24)
GFR, Est AFR Am: >60 mL/min (ref >60)
GFR, Estimated: >60 mL/min (ref >60)
Glucose, Bld: 97 mg/dL (ref 70–99)
Potassium: 4.5 mmol/L (ref 3.5–5.1)
Sodium: 138 mmol/L (ref 135–145)
Total Bilirubin: 0.3 mg/dL (ref 0.3–1.2)
Total Protein: 6.7 g/dL (ref 6.5–8.1)

## 2019-01-02 LAB — CBC WITH DIFFERENTIAL (CANCER CENTER ONLY)
Abs Immature Granulocytes: 0.06 10*3/uL (ref 0.00–0.07)
Basophils Absolute: 0.1 10*3/uL (ref 0.0–0.1)
Basophils Relative: 1 %
Eosinophils Absolute: 0.8 10*3/uL — ABNORMAL HIGH (ref 0.0–0.5)
Eosinophils Relative: 9 %
HCT: 33.2 % — ABNORMAL LOW (ref 39.0–52.0)
Hemoglobin: 10.7 g/dL — ABNORMAL LOW (ref 13.0–17.0)
Immature Granulocytes: 1 %
Lymphocytes Relative: 19 %
Lymphs Abs: 1.6 10*3/uL (ref 0.7–4.0)
MCH: 32.3 pg (ref 26.0–34.0)
MCHC: 32.2 g/dL (ref 30.0–36.0)
MCV: 100.3 fL — ABNORMAL HIGH (ref 80.0–100.0)
Monocytes Absolute: 0.9 10*3/uL (ref 0.1–1.0)
Monocytes Relative: 11 %
Neutro Abs: 5 10*3/uL (ref 1.7–7.7)
Neutrophils Relative %: 59 %
Platelet Count: 228 10*3/uL (ref 150–400)
RBC: 3.31 MIL/uL — ABNORMAL LOW (ref 4.22–5.81)
RDW: 18.3 % — ABNORMAL HIGH (ref 11.5–15.5)
WBC Count: 8.4 10*3/uL (ref 4.0–10.5)
nRBC: 0 % (ref 0.0–0.2)

## 2019-01-02 MED ORDER — HEPARIN SOD (PORK) LOCK FLUSH 100 UNIT/ML IV SOLN
500.0000 [IU] | Freq: Once | INTRAVENOUS | Status: AC | PRN
  Administered 2019-01-02: 500 [IU]
  Filled 2019-01-02: qty 5

## 2019-01-02 MED ORDER — SODIUM CHLORIDE 0.9 % IV SOLN
Freq: Once | INTRAVENOUS | Status: AC
  Filled 2019-01-02: qty 250

## 2019-01-02 MED ORDER — SODIUM CHLORIDE 0.9% FLUSH
10.0000 mL | INTRAVENOUS | Status: DC | PRN
  Administered 2019-01-02: 10 mL
  Filled 2019-01-02: qty 10

## 2019-01-02 MED ORDER — SODIUM CHLORIDE 0.9% FLUSH
10.0000 mL | Freq: Once | INTRAVENOUS | Status: AC
  Administered 2019-01-02: 10 mL
  Filled 2019-01-02: qty 10

## 2019-01-02 MED ORDER — SODIUM CHLORIDE 0.9 % IV SOLN
200.0000 mg | Freq: Once | INTRAVENOUS | Status: AC
  Administered 2019-01-02: 200 mg via INTRAVENOUS
  Filled 2019-01-02: qty 8

## 2019-01-02 NOTE — Patient Instructions (Signed)
[facility] Discharge Instructions for Patients Receiving Chemotherapy  Today you received the following chemotherapy agents Pembrolizumab [facility]).  To help prevent nausea and vomiting after your treatment, we encourage you to take your nausea medication as prescribed.   If you develop nausea and vomiting that is not controlled by your nausea medication, call the clinic.   BELOW ARE SYMPTOMS THAT SHOULD BE REPORTED IMMEDIATELY:  *FEVER GREATER THAN 100.5 F  *CHILLS WITH OR WITHOUT FEVER  NAUSEA AND VOMITING THAT IS NOT CONTROLLED WITH YOUR NAUSEA MEDICATION  *UNUSUAL SHORTNESS OF BREATH  *UNUSUAL BRUISING OR BLEEDING  TENDERNESS IN MOUTH AND THROAT WITH OR WITHOUT PRESENCE OF ULCERS  *URINARY PROBLEMS  *BOWEL PROBLEMS  UNUSUAL RASH Items with * indicate a potential emergency and should be followed up as soon as possible.  Feel free to call the clinic should you have any questions or concerns. The clinic phone number is [phone].  Please show the [place] at check-in to the Emergency Department and triage nurse.  Pembrolizumab injection What is this medicine? PEMBROLIZUMAB (pem broe liz ue mab) is a monoclonal antibody. It is used to treat bladder cancer, cervical cancer, endometrial cancer, esophageal cancer, head and neck cancer, hepatocellular cancer, Hodgkin lymphoma, kidney cancer, lymphoma, melanoma, Merkel cell carcinoma, lung cancer, stomach cancer, urothelial cancer, and cancers that have a certain genetic condition. This medicine may be used for other purposes; ask your health care provider or pharmacist if you have questions. COMMON BRAND NAME(S): Keytruda What should I tell my health care provider before I take this medicine? They need to know if you have any of these conditions:  diabetes  immune system problems  inflammatory bowel disease  liver disease  lung or breathing disease  lupus  received or scheduled to receive  an organ transplant or a stem-cell transplant that uses donor stem cells  an unusual or allergic reaction to pembrolizumab, other medicines, foods, dyes, or preservatives  pregnant or trying to get pregnant  breast-feeding How should I use this medicine? This medicine is for infusion into a vein. It is given by a health care professional in a hospital or clinic setting. A special MedGuide will be given to you before each treatment. Be sure to read this information carefully each time. Talk to your pediatrician regarding the use of this medicine in children. While this drug may be prescribed for selected conditions, precautions do apply. Overdosage: If you think you have taken too much of this medicine contact a poison control center or emergency room at once. NOTE: This medicine is only for you. Do not share this medicine with others. What if I miss a dose? It is important not to miss your dose. Call your doctor or health care professional if you are unable to keep an appointment. What may interact with this medicine? Interactions have not been studied. Give your health care provider a list of all the medicines, herbs, non-prescription drugs, or dietary supplements you use. Also tell them if you smoke, drink alcohol, or use illegal drugs. Some items may interact with your medicine. This list may not describe all possible interactions. Give your health care provider a list of all the medicines, herbs, non-prescription drugs, or dietary supplements you use. Also tell them if you smoke, drink alcohol, or use illegal drugs. Some items may interact with your medicine. What should I watch for while using this medicine? Your condition will be monitored carefully while you are receiving this medicine. You may  need blood work done while you are taking this medicine. Do not become pregnant while taking this medicine or for 4 months after stopping it. Women should inform their doctor if they wish to  become pregnant or think they might be pregnant. There is a potential for serious side effects to an unborn child. Talk to your health care professional or pharmacist for more information. Do not breast-feed an infant while taking this medicine or for 4 months after the last dose. What side effects may I notice from receiving this medicine? Side effects that you should report to your doctor or health care professional as soon as possible:  allergic reactions like skin rash, itching or hives, swelling of the face, lips, or tongue  bloody or black, tarry  breathing problems  changes in vision  chest pain  chills  confusion  constipation  cough  diarrhea  dizziness or feeling faint or lightheaded  fast or irregular heartbeat  fever  flushing  hair loss  joint pain  low blood counts - this medicine may decrease the number of white blood cells, red blood cells and platelets. You may be at increased risk for infections and bleeding.  muscle pain  muscle weakness  persistent headache  redness, blistering, peeling or loosening of the skin, including inside the mouth  signs and symptoms of high blood sugar such as dizziness; dry mouth; dry skin; fruity breath; nausea; stomach pain; increased hunger or thirst; increased urination  signs and symptoms of kidney injury like trouble passing urine or change in the amount of urine  signs and symptoms of liver injury like dark urine, light-colored stools, loss of appetite, nausea, right upper belly pain, yellowing of the eyes or skin  sweating  swollen lymph nodes  weight loss Side effects that usually do not require medical attention (report to your doctor or health care professional if they continue or are bothersome):  decreased appetite  muscle pain  tiredness This list may not describe all possible side effects. Call your doctor for medical advice about side effects. You may report side effects to FDA at  1-800-FDA-1088. Where should I keep my medicine? This drug is given in a hospital or clinic and will not be stored at home. NOTE: This sheet is a summary. It may not cover all possible information. If you have questions about this medicine, talk to your doctor, pharmacist, or health care provider.  2020 Elsevier/Gold Standard (2018-01-29 13:46:58)  Coronavirus (COVID-19) Are you at risk?  Are you at risk for the Coronavirus (COVID-19)?  To be considered HIGH RISK for Coronavirus (COVID-19), you have to meet the following criteria:  . Traveled to [place], [place], [place], [place] or [place]; or in the [place] to [place], [place], [place], or [place]; and have fever, cough, and shortness of breath within the last 2 weeks of travel OR . Been in close contact with a person diagnosed with COVID-19 within the last 2 weeks and have fever, cough, and shortness of breath . IF YOU DO NOT MEET THESE CRITERIA, YOU ARE CONSIDERED LOW RISK FOR COVID-19.  What to do if you are HIGH RISK for COVID-19?  [name] If you are having a medical emergency, call 911. . Seek medical care right away. Before you go to a doctor's office, urgent care or emergency department, call ahead and tell them about your recent travel, contact with someone diagnosed with COVID-19, and your symptoms. You should receive instructions from your physician's office regarding next  steps of care.  . When you arrive at healthcare provider, tell the healthcare staff immediately you have returned from visiting [place], [place], [place], [place] or [place]; or traveled in the [place] to [place], [place], [place], or [place]; in the last two weeks or you have been in close contact with a person diagnosed with COVID-19 in the last 2 weeks.   . Tell the health care staff about your symptoms: fever, cough and shortness of breath. . After you have been seen by a medical provider, you will be either: o Tested for (COVID-19) and  discharged home on quarantine except to seek medical care if symptoms worsen, and asked to  - Stay home and avoid contact with others until you get your results (4-5 days)  - Avoid travel on public transportation if possible (such as bus, train, or airplane) or o Sent to the Emergency Department by EMS for evaluation, COVID-19 testing, and possible admission depending on your condition and test results.  What to do if you are LOW RISK for COVID-19?  Reduce your risk of any infection by using the same precautions used for avoiding the common cold or flu:  [name] Wash your hands often with soap and warm water for at least 20 seconds.  If soap and water are not readily available, use an alcohol-based hand sanitizer with at least 60% alcohol.  . If coughing or sneezing, cover your mouth and nose by coughing or sneezing into the elbow areas of your shirt or coat, into a tissue or into your sleeve (not your hands). . Avoid shaking hands with others and consider head nods or verbal greetings only. . Avoid touching your eyes, nose, or mouth with unwashed hands.  . Avoid close contact with people who are sick. . Avoid places or events with large numbers of people in one location, like concerts or sporting events. . Carefully consider travel plans you have or are making. . If you are planning any travel outside or inside the [place], visit the CDC's Travelers' Health webpage for the latest health notices. . If you have some symptoms but not all symptoms, continue to monitor at home and seek medical attention if your symptoms worsen. . If you are having a medical emergency, call 911.   [place] / e-Visit: [email or web address]         [facility]: [facility]: [id number]                   [facility]: [phone]

## 2019-01-03 LAB — TSH: TSH: 0.851 u[IU]/mL (ref 0.320–4.118)

## 2019-01-13 ENCOUNTER — Other Ambulatory Visit: Payer: Self-pay | Admitting: Oncology

## 2019-01-13 ENCOUNTER — Telehealth: Payer: Self-pay

## 2019-01-13 DIAGNOSIS — R519 Headache, unspecified: Secondary | ICD-10-CM

## 2019-01-13 DIAGNOSIS — C679 Malignant neoplasm of bladder, unspecified: Secondary | ICD-10-CM

## 2019-01-13 NOTE — Telephone Encounter (Signed)
-----   Message from [name], MD sent at 01/13/2019 11:27 AM EST ----- Regarding: RE: Patient advice request He is to continue taking antinausea medication.  These should be appropriate to control his nausea.  I recommend scheduling Phenergan around-the-clock every 8 hours and to use the Ativan as needed.  He is to update [place] in the next 24 to 48 hours.  Thanks ----- Message ----- From: Teodoro Spray, RN Sent: 01/13/2019  10:29 AM EST To: [name], MD Subject: RE: Patient advice request                     Patients states he has been taking compazine, phenergan, and ativan as prescribed.  Patient also has a prescription for zofran but states that does not have an affect on him.   ----- Message ----- From: [name], MD Sent: 01/13/2019  10:21 AM EST To: Teodoro Spray, RN Subject: RE: Patient advice request                     Do you know what nausea medication he is using now?  He is prescribed Compazine, Phenergan as well as Ativan sublingual.  Please double check to see what he is exactly taking and we can prescribe a different medication appropriately.I will arrange for MRI of the brain to evaluate his headaches.  Thank you ----- Message ----- From: Teodoro Spray, RN Sent: 01/13/2019  10:05 AM EST To: [name], MD Subject: Patient advice request                         Patient called office stating he has been nauseous, has been unable to keep food down, and has had a headache for 5 days. Patient states he was able to eat soup last night and a biscuit this AM without any difficulty.  Patient states he has been drinking sprite and water. Patient states he has been taking his nausea medication but they didn't seem to help.  Patient states he doesn't feel he is dehydrated and wants to know what to do about his nausea.   Please advise.

## 2019-01-13 NOTE — Telephone Encounter (Signed)
Called and spoke to patient's friend [name]).  Informed her of information listed below.  She verbalized understanding and agreed to relay information to patient.

## 2019-01-17 ENCOUNTER — Other Ambulatory Visit: Payer: Self-pay

## 2019-01-17 ENCOUNTER — Encounter (HOSPITAL_COMMUNITY): Payer: Self-pay | Admitting: Emergency Medicine

## 2019-01-17 ENCOUNTER — Emergency Department (HOSPITAL_COMMUNITY)
Admission: EM | Admit: 2019-01-17 | Discharge: 2019-01-17 | Disposition: A | Discharge status: Home / Self Care | Payer: PRIVATE HEALTH INSURANCE | Attending: Emergency Medicine | Admitting: Emergency Medicine

## 2019-01-17 ENCOUNTER — Emergency Department (HOSPITAL_COMMUNITY): Payer: PRIVATE HEALTH INSURANCE

## 2019-01-17 DIAGNOSIS — Z86711 Personal history of pulmonary embolism: Secondary | ICD-10-CM | POA: Diagnosis not present

## 2019-01-17 DIAGNOSIS — Z79899 Other long term (current) drug therapy: Secondary | ICD-10-CM | POA: Diagnosis not present

## 2019-01-17 DIAGNOSIS — Z87891 Personal history of nicotine dependence: Secondary | ICD-10-CM | POA: Diagnosis not present

## 2019-01-17 DIAGNOSIS — Z8546 Personal history of malignant neoplasm of prostate: Secondary | ICD-10-CM | POA: Diagnosis not present

## 2019-01-17 DIAGNOSIS — R519 Headache, unspecified: Secondary | ICD-10-CM

## 2019-01-17 DIAGNOSIS — Z8551 Personal history of malignant neoplasm of bladder: Secondary | ICD-10-CM | POA: Diagnosis not present

## 2019-01-17 DIAGNOSIS — F1210 Cannabis abuse, uncomplicated: Secondary | ICD-10-CM | POA: Insufficient documentation

## 2019-01-17 LAB — COMPREHENSIVE METABOLIC PANEL WITH GFR
ALT: 19 U/L (ref 0–44)
AST: 18 U/L (ref 15–41)
Albumin: 3.8 g/dL (ref 3.5–5.0)
Alkaline Phosphatase: 133 U/L — ABNORMAL HIGH (ref 38–126)
Anion gap: 7 (ref 5–15)
BUN: 16 mg/dL (ref 6–20)
CO2: 24 mmol/L (ref 22–32)
Calcium: 10.5 mg/dL — ABNORMAL HIGH (ref 8.9–10.3)
Chloride: 109 mmol/L (ref 98–111)
Creatinine, Ser: 0.82 mg/dL (ref 0.61–1.24)
GFR calc Af Amer: >60 mL/min
GFR calc non Af Amer: >60 mL/min
Glucose, Bld: 106 mg/dL — ABNORMAL HIGH (ref 70–99)
Potassium: 4.1 mmol/L (ref 3.5–5.1)
Sodium: 140 mmol/L (ref 135–145)
Total Bilirubin: 0.6 mg/dL (ref 0.3–1.2)
Total Protein: 6.9 g/dL (ref 6.5–8.1)

## 2019-01-17 LAB — CBC
HCT: 55.6 % — ABNORMAL HIGH (ref 39.0–52.0)
Hemoglobin: 18.5 g/dL — ABNORMAL HIGH (ref 13.0–17.0)
MCH: 33.2 pg (ref 26.0–34.0)
MCHC: 33.3 g/dL (ref 30.0–36.0)
MCV: 99.8 fL (ref 80.0–100.0)
Platelets: 115 K/uL — ABNORMAL LOW (ref 150–400)
RBC: 5.57 MIL/uL (ref 4.22–5.81)
RDW: 16.5 % — ABNORMAL HIGH (ref 11.5–15.5)
WBC: 3.7 K/uL — ABNORMAL LOW (ref 4.0–10.5)
nRBC: 0 % (ref 0.0–0.2)

## 2019-01-17 MED ORDER — SODIUM CHLORIDE 0.9 % IV SOLN: INTRAVENOUS | Status: DC

## 2019-01-17 MED ORDER — HEPARIN SOD (PORK) LOCK FLUSH 100 UNIT/ML IV SOLN
500.0000 [IU] | Freq: Once | INTRAVENOUS | Status: AC
  Administered 2019-01-17: 500 [IU]
  Filled 2019-01-17: qty 5

## 2019-01-17 MED ORDER — HYDROMORPHONE HCL 1 MG/ML IJ SOLN
1.0000 mg | Freq: Once | INTRAMUSCULAR | Status: AC
  Administered 2019-01-17: 1 mg via INTRAVENOUS
  Filled 2019-01-17: qty 1

## 2019-01-17 MED ORDER — HYDROMORPHONE HCL 2 MG/ML IJ SOLN
2.0000 mg | Freq: Once | INTRAMUSCULAR | Status: AC
  Administered 2019-01-17: 2 mg via INTRAVENOUS
  Filled 2019-01-17: qty 1

## 2019-01-17 MED ORDER — ONDANSETRON HCL 4 MG/2ML IJ SOLN
4.0000 mg | Freq: Once | INTRAMUSCULAR | Status: AC
  Administered 2019-01-17: 4 mg via INTRAVENOUS
  Filled 2019-01-17: qty 2

## 2019-01-17 MED ORDER — SODIUM CHLORIDE 0.9 % IV BOLUS
1000.0000 mL | Freq: Once | INTRAVENOUS | Status: AC
  Administered 2019-01-17: 1000 mL via INTRAVENOUS

## 2019-01-17 MED ORDER — GADOBUTROL 1 MMOL/ML IV SOLN
7.0000 mL | Freq: Once | INTRAVENOUS | Status: AC | PRN
  Administered 2019-01-17: 7 mL via INTRAVENOUS

## 2019-01-17 NOTE — ED Notes (Signed)
Santiago Glad called MRI at 18:50. Notified by Donna Christen.

## 2019-01-17 NOTE — Discharge Instructions (Addendum)
Dr. [name] has been made aware of your encounter today and will reach out to you at the beginning of the week. Please call the office if you do not hear from him.   Please return to the ED or seek medical attention immediately should she develop any extremity weakness, numbness, dizziness, or any other neurologic deficits.  Please return for any other new or worsening symptoms, as well.

## 2019-01-17 NOTE — ED Triage Notes (Signed)
Per pt, states he has stage 4 cancer-states headache and back pain for 1 week-vomiting on and off as well-called oncologist but hasn't gotten a call back

## 2019-01-17 NOTE — ED Provider Notes (Signed)
[facility] DEPT Provider Note   CSN: [id number] Arrival date & time: 01/17/19  1114     History Chief Complaint  Patient presents with   Headache    Rodney [name] is a 49 y.o. male with PMH significant for metastatic bladder cancer with numerous bone metastases who presents to the ED with a 1 week history of constant right-sided headache with intermittent nausea and nonbloody emesis.  He has attempted to contact his oncologist, with no success.  He has a MR brain with and without contrast scheduled for 01/22/2019 outpatient, ordered by his oncologist Dr. [name].  He has been using his home fentanyl patches and p.o. Dilaudid, without resolution.  He endorses diminished p.o. intake due to his episodes of emesis despite using his anti-nausea medications, as prescribed.  He denies any recent was, fevers or chills, chest pain or difficulty breathing, new or different abdominal discomfort, or other symptoms.  He denies any blurred vision, dizziness, ataxia, numbness, tingling, or focal weakness.  No preceding trauma.  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[facility])     Patient Active Problem List   Diagnosis Date Noted   Pulmonary embolism ([place]) 10/11/2018   Palliative care by specialist    Hypokalemia 09/08/2018   Hypophosphatemia 0000000   Acute metabolic encephalopathy 0000000   Anemia associated with chemotherapy 09/08/2018   Leukopenia due to antineoplastic chemotherapy ([place]) 09/08/2018   Bacteremia 09/08/2018   AKI (acute kidney injury) ([place])    Failure to thrive in adult    UTI (urinary tract infection)  09/07/2018   ARF (acute renal failure) ([place]) 09/07/2018   Dehydration 09/07/2018   Hypernatremia 09/07/2018   AMS (altered mental status) 09/07/2018   FTT (failure to thrive) in adult [id number]   Metabolic acidosis [id number]   Acute hypernatremia 09/07/2018   Odynophagia 09/07/2018   Oral thrush 09/07/2018   Port-A-Cath in place 08/13/2018   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 [facility])    Pain    Metastatic cancer ([place])    Goals of care, counseling/discussion    Intractable pain 07/20/2018   Constipation 07/20/2018   Acute lower UTI 07/20/2018   Leukocytosis 07/20/2018   Secondary malignancy of vertebral column ([place]) 07/16/2018   Status post radical cystoprostatectomy 11/08/2017   Bladder cancer ([place]) 11/08/2017   Genetic testing 10/26/2017   Family history of prostate cancer    Family history of gastric cancer    Family history of melanoma    Family history of colon cancer    Urothelial cancer ([place])    Malignant neoplasm of prostate ([place]) 09/11/2017    Past Surgical History:  Procedure Laterality Date   BACK SURGERY  2012   3 disc; dr Carloyn Manner  did first and dr elsner did the last 2    CYSTOSCOPY W/ RETROGRADES N/A 07/02/2017   Procedure: CYSTOSCOPY WITH RETROGRADE PYELOGRAM/EXAM UNDER ANESTHESIA;  Surgeon: Raynelle Bring, MD;  Location: [facility];  Service: Urology;  Laterality: N/A;  CYSTOSCOPY WITH BIOPSY N/A 08/09/2017   Procedure: CYSTOSCOPY WITH PROSTATE NEEDLE BIOPSY;  Surgeon: Raynelle Bring, MD;  Location: [facility];  Service: Urology;  Laterality: N/A;  GENERAL ANESTHESIA WITH PARALYSIS/ ONLY NEEDS 60 MIN FOR ALL PROCEDURES   IR BONE TUMOR(S)RF ABLATION  11/29/2018   IR BONE TUMOR(S)RF ABLATION  11/29/2018   IR IMAGING GUIDED PORT INSERTION  08/07/2018   IR KYPHO EA ADDL LEVEL THORACIC OR LUMBAR  11/29/2018   IR KYPHO THORACIC WITH BONE BIOPSY  11/29/2018   IR RADIOLOGIST EVAL & MGMT   11/14/2018   LEG SURGERY  2012   4 metal plates in plates    TRANSRECTAL ULTRASOUND N/A 08/09/2017   Procedure: TRANSRECTAL ULTRASOUND;  Surgeon: Raynelle Bring, MD;  Location: [facility];  Service: Urology;  Laterality: N/A;  ONLY NEEDS 60 MIN FOR ALL PROCEDURES   TRANSURETHRAL RESECTION OF BLADDER TUMOR N/A 07/02/2017   Procedure: TRANSURETHRAL RESECTION OF BLADDER TUMOR (TURBT);  Surgeon: Raynelle Bring, MD;  Location: [facility];  Service: Urology;  Laterality: N/A;   TRANSURETHRAL RESECTION OF BLADDER TUMOR N/A 08/09/2017   Procedure: TRANSURETHRAL RESECTION OF BLADDER TUMOR (TURBT);  Surgeon: Raynelle Bring, MD;  Location: [facility];  Service: Urology;  Laterality: N/A;       Family History  Problem Relation Age of Onset   Heart attack Father    Hypertension Father    Diabetes Father    Heart failure Father    Hypertension Mother    Gastric cancer Sister 59       d. 38   Prostate cancer Brother 80   Melanoma Maternal Aunt    Stroke Maternal Uncle    Stroke Paternal Uncle    Melanoma Maternal Grandmother        dx under 74   Colon cancer Maternal Grandmother    Heart disease Maternal Grandfather    Heart attack Paternal Grandfather     Social History   Tobacco Use   Smoking status: Former Smoker    Packs/day: 2.00    Years: 8.00    Pack years: 16.00    Types: Cigarettes    Quit date: 05/29/2016    Years since quitting: 2.6   Smokeless tobacco: Former Systems developer   Tobacco comment: [place] 2009  Substance Use Topics   Alcohol use: Not Currently   Drug use: Not Currently    Types: Marijuana    Comment: last use was 05-29-2017    Home Medications Prior to Admission medications   Medication Sig Start Date End Date Taking? Authorizing Provider  dronabinol (MARINOL) 2.5 MG capsule Take 1 capsule (2.5 mg total) by mouth 2 (two) times daily before lunch and supper. 11/27/18  Yes [name], MD  DULoxetine (CYMBALTA) 30 MG capsule Take 1 capsule (30 mg total) by  mouth daily. 10/16/18  Yes [name], MD  enoxaparin (LOVENOX) 60 MG/0.6ML injection Inject 0.6 mLs (60 mg total) into the skin every 12 (twelve) hours. 10/13/18 01/17/19 Yes Dessa Phi, DO  fentaNYL (DURAGESIC) 100 MCG/HR Place 1 patch onto the skin every 3 (three) days. Patient taking differently: Place 0.5 patches onto the skin every 3 (three) days.  11/27/18  Yes Shadad, Mathis Dad, MD  HYDROmorphone (DILAUDID) 2 MG tablet TAKE TWO TABLETS BY MOUTH EVERY 4 HOURS AS NEEDED FOR SEVERE PAIN Patient taking differently: Take 4 mg by mouth every 4 (four) hours as needed for severe pain.  12/30/18  Yes [name], MD  lidocaine-prilocaine (EMLA) cream Apply 1 application  topically as needed. Patient taking differently: Apply 1 application topically as needed (access).  08/05/18  Yes [name], MD  LORazepam (ATIVAN) 0.5 MG tablet Place 1 tablet (0.5 mg total) under the tongue every 8 (eight) hours as needed (nausea). 11/27/18  Yes [name], MD  Multiple Vitamin (MULTIVITAMIN WITH MINERALS) TABS tablet Take 1 tablet by mouth daily.   Yes [provider]  ondansetron (ZOFRAN) 4 MG tablet TAKE 1 TABLET BY MOUTH EVERY 6 HOURS AS NEEDED FOR NAUSEA Patient taking differently: Take 4 mg by mouth every 6 (six) hours as needed for nausea or vomiting.  11/26/18  Yes [name], MD  pantoprazole (PROTONIX) 40 MG tablet Take 1 tablet (40 mg total) by mouth daily. 12/26/18  Yes [name], MD  prochlorperazine (COMPAZINE) 10 MG tablet TAKE 1 TABLET BY MOUTH EVERY 6 HOURS AS NEEDED FOR NAUSEA AND VOMITING Patient taking differently: Take 10 mg by mouth every 6 (six) hours as needed for nausea or vomiting. TAKE 1 TABLET BY MOUTH EVERY 6 HOURS AS NEEDED FOR NAUSEA AND VOMITING 12/26/18  Yes Shadad, Mathis Dad, MD  promethazine (PHENERGAN) 25 MG tablet TAKE 1 TABLET BY MOUTH EVERY 6 HOURS AS NEEDED FOR NAUSEA OR VOMITING Patient taking differently: Take 25 mg by mouth every 6 (six)  hours as needed for nausea or vomiting.  01/13/19  Yes Shadad, Mathis Dad, MD  senna-docusate (SENOKOT-S) 8.6-50 MG tablet Take 2 tablets by mouth 2 (two) times daily. Patient taking differently: Take 2 tablets by mouth daily.  08/02/18  Yes Sheikh, [place], DO    Allergies    Drug class [iodinated diagnostic agents], Morphine and related, and Vicodin [hydrocodone-acetaminophen]  Review of Systems   Review of Systems  All other systems reviewed and are negative.   Physical Exam Updated Vital Signs BP 96/67    Pulse 98    Temp 97.9 F (36.6 C) (Oral)    Resp 11    SpO2 92%   Physical Exam Vitals and nursing note reviewed. Exam conducted with a chaperone present.  Constitutional:      General: He is not in acute distress.    Appearance: Normal appearance. He is not ill-appearing.  HENT:     Head: Normocephalic and atraumatic.     Mouth/Throat:     Pharynx: Oropharynx is clear.  Eyes:     General: No scleral icterus.    Extraocular Movements: Extraocular movements intact.     Conjunctiva/sclera: Conjunctivae normal.     Pupils: Pupils are equal, round, and reactive to light.  Neck:     Comments: No meningismus. Cardiovascular:     Rate and Rhythm: Normal rate and regular rhythm.     Pulses: Normal pulses.     Heart sounds: Normal heart sounds.  Pulmonary:     Effort: Pulmonary effort is normal. No respiratory distress.     Breath sounds: Normal breath sounds. No wheezing or rales.  Musculoskeletal:     Cervical back: Normal range of motion and neck supple. No rigidity.  Skin:    General: Skin is dry.  Neurological:     General: No focal deficit present.     Mental Status: He is alert and oriented to person, place, and time.     GCS: GCS eye subscore is 4. GCS verbal subscore is 5. GCS motor subscore is 6.     Cranial Nerves: No cranial nerve deficit.     Sensory: No sensory deficit.  Motor: No weakness.     Coordination: Coordination normal.     Gait: Gait normal.   Psychiatric:        Mood and Affect: Mood normal.        Behavior: Behavior normal.        Thought Content: Thought content normal.     ED Results / Procedures / Treatments   Labs (all labs ordered are listed, but only abnormal results are displayed) Labs Reviewed  CBC - Abnormal; Notable for the following components:      Result Value   WBC 3.7 (*)    Hemoglobin 18.5 (*)    HCT 55.6 (*)    RDW 16.5 (*)    Platelets 115 (*)    All other components within normal limits  COMPREHENSIVE METABOLIC PANEL - Abnormal; Notable for the following components:   Glucose, Bld 106 (*)    Calcium 10.5 (*)    Alkaline Phosphatase 133 (*)    All other components within normal limits    EKG None  Radiology MR Brain W and Wo Contrast  Result Date: 01/17/2019 CLINICAL DATA:  Headache. Intracranial hemorrhage suspected. Stage for cancer of the bladder. EXAM: MRI HEAD WITHOUT AND WITH CONTRAST TECHNIQUE: Multiplanar, multiecho pulse sequences of the brain and surrounding structures were obtained without and with intravenous contrast. CONTRAST:  53mL GADAVIST GADOBUTROL 1 MMOL/ML IV SOLN COMPARISON:  Head CT 09/07/2018 FINDINGS: Brain: Diffusion imaging does not show any acute or subacute infarction or other cause of restricted diffusion. No abnormality is seen affecting the brainstem or cerebellum. Cerebral hemispheres show pronounced chronic small-vessel ischemic changes of the white matter considering age. No cortical or large vessel territory infarction. No hemorrhage, hydrocephalus or extra-axial collection. After contrast administration, there is leptomeningeal enhancement along the surface of the inferior cerebellum and around the brainstem consistent with leptomeningeal metastatic disease. I do not see any intraparenchymal metastasis. Vascular: Major vessels at the base of the brain show flow. Skull and upper cervical spine: Probable metastasis to the C3 vertebral body. 8 mm enhancing focus of the  left frontal bone could be a metastasis in that location. Sinuses/Orbits: Clear/normal Other: None IMPRESSION: Abnormal leptomeningeal enhancement along the surface of the cerebellum and around the brainstem consistent with leptomeningeal metastatic disease. No intraparenchymal metastasis is seen. Bone metastasis suspected in the C3 vertebral body and in the left frontal calvarium. Electronically Signed   By: Nelson Chimes M.D.   On: 01/17/2019 21:18    Procedures Procedures (including critical care time)  Medications Ordered in ED Medications  sodium chloride 0.9 % bolus 1,000 mL (0 mLs Intravenous Stopped 01/17/19 1928)    And  0.9 %  sodium chloride infusion ( Intravenous New Bag/Given 01/17/19 1747)  HYDROmorphone (DILAUDID) injection 1 mg (1 mg Intravenous Given 01/17/19 1326)  ondansetron (ZOFRAN) injection 4 mg (4 mg Intravenous Given 01/17/19 1326)  HYDROmorphone (DILAUDID) injection 2 mg (2 mg Intravenous Given 01/17/19 2031)  gadobutrol (GADAVIST) 1 MMOL/ML injection 7 mL (7 mLs Intravenous Contrast Given 01/17/19 2044)    ED Course  I have reviewed the triage vital signs and the nursing notes.  Pertinent labs & imaging results that were available during my care of the patient were reviewed by me and considered in my medical decision making (see chart for details).    MDM Rules/Calculators/A&P                      Dr. [name], patient's oncologist, placed order for MRI  brain with and without contrast on 01/13/2019 to be completed 01/21/2019 due to his acute intractable headache in setting of metastatic malignancy.  Discussed with patient option of obtaining MR here in the ED given his persistent headache symptoms that have brought him to the ER for evaluation, he voiced enthusiasm and would like to proceed with imaging.  Patient's last chemotherapy infusion agent, Keytruda, was provided 01/02/2019.  Patient had T7-T8 kyphoplasty 11/29/2018 and required PRBC transfusion for hemoglobin of 6.8.   Today his hemoglobin is mildly elevated 18.5, with slight leukopenia to 3.7.  Overall his laboratory work-up is reassuring, as are his vital signs.  On reexamination after providing IV fluids, Zofran, and morphine, patient continues to endorse a headache, but is feeling improved compared to how he felt before he came in today.  He has been taking his Dilaudid and continue to use fentanyl patches at home, with little relief.  On exam, he is neurologically intact with no focal weakness, sensation loss, or other neurologic deficits.  Dr. Lacinda Axon personally also evaluated patient and feels as though he is safe for discharge with oncology follow-up given lack of acute distress or neurologic findings if MRI unremarkable.   Spoke with Dr. Maylon Peppers who will notify Dr. [name] of patient's presentation and his evidence of disease progression on today's MR Brain W and WO.  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  Plan is to manage patient's pain outpatient, but no other intervention at this time.  Dr. [name] will be made aware and will be sure to reach out to patient early next week regarding ongoing evaluation and management.  Patient has been taking 2 mg tabs of Dilaudid p.o., gel suggests that he may increase to 1.5 tabs (3 mg) as needed for his pain and discomfort.  Patient also must return to the ED or seek medical attention immediately for any new focal neurologic deficits such as new weakness, diminished sensation, or any other abnormal deficits. Patient voiced understanding and is agreeable to the plan.     Final Clinical Impression(s) / ED Diagnoses Final diagnoses:  Acute intractable headache, unspecified headache type    Rx / DC Orders ED Discharge Orders    None       Corena Herter, PA-C 01/17/19 2149    Drenda Freeze, MD 01/18/19 1348

## 2019-01-20 ENCOUNTER — Telehealth: Payer: Self-pay

## 2019-01-20 ENCOUNTER — Other Ambulatory Visit: Payer: Self-pay | Admitting: Oncology

## 2019-01-20 DIAGNOSIS — C679 Malignant neoplasm of bladder, unspecified: Secondary | ICD-10-CM

## 2019-01-20 MED ORDER — HYDROMORPHONE HCL 2 MG PO TABS: ORAL_TABLET | ORAL | 0 refills | Status: DC

## 2019-01-20 MED ORDER — DEXAMETHASONE 4 MG PO TABS: 4.0000 mg | ORAL_TABLET | Freq: Two times a day (BID) | ORAL | 1 refills | Status: DC

## 2019-01-20 NOTE — Progress Notes (Signed)
The results of the MRI were personally reviewed and discussed with the patient and his fiance via phone.  He appears to have leptomeningeal involvement with worsening headaches associated with nausea and vomiting  The natural course of this disease and treatment approach was reviewed.  He understands that this carries a very poor prognosis especially in the setting of a solid tumor malignancy such as GU cancer.  Any treatment at this time would be directed to palliating his symptoms which would include a trial of steroids as well as palliative radiation therapy.  Overall prognosis and goals of care were reiterated at this time and we will discuss further upon his upcoming visit on January 7.  But he understands that this is potentially the beginning of an end-stage disease of his cancer.  Consideration for hospice and supportive care only should be made at this time.  We will start dexamethasone 4 mg twice daily to attempt palliate his symptoms at this time.  We will address future treatment options for his cancer next visit but will likely not receive any additional therapy.

## 2019-01-20 NOTE — Telephone Encounter (Signed)
Patient fiance Rodney [name] called to state that the patient was seen in the ED on Friday where they performed the brain MRI. Upon DC they recommended that they follow up with Dr. [name] today. Rodney [name] stated that they would like to review the MRI results with Dr. [name]. Also she is concerned that the patient continues to have headaches, nausea, vomiting, and no appetite, and the medications are minimally effective for these symptoms. Dr. [name] made aware and stated that he will call patient today to discuss concerns. Contacted Rodney [name] and made her aware. She also verbalized that the patient needs a refill of Dilaudid and explained that Dr. [name] has been made aware. She had no other questions or concerns.

## 2019-01-21 ENCOUNTER — Other Ambulatory Visit: Payer: Self-pay

## 2019-01-21 ENCOUNTER — Ambulatory Visit: Payer: PRIVATE HEALTH INSURANCE | Admitting: Radiation Oncology

## 2019-01-21 ENCOUNTER — Ambulatory Visit
Admission: RE | Admit: 2019-01-21 | Discharge: 2019-01-21 | Disposition: A | Discharge status: Home / Self Care | Payer: PRIVATE HEALTH INSURANCE | Source: Ambulatory Visit | Attending: Radiation Oncology | Admitting: Radiation Oncology

## 2019-01-21 ENCOUNTER — Encounter: Payer: Self-pay | Admitting: Radiation Oncology

## 2019-01-21 DIAGNOSIS — C679 Malignant neoplasm of bladder, unspecified: Secondary | ICD-10-CM

## 2019-01-21 DIAGNOSIS — C7951 Secondary malignant neoplasm of bone: Secondary | ICD-10-CM

## 2019-01-21 NOTE — Progress Notes (Signed)
Radiation Oncology         [phone] ________________________________  Outpatient Reconsultation - Conducted via telephone due to current COVID-19 concerns for limiting patient exposure  I spoke with the patient to conduct this consult visit via telephone to spare the patient unnecessary potential exposure in the healthcare setting during the current COVID-19 pandemic. The patient was notified in advance and was offered a [place] meeting to allow for face to face communication but unfortunately reported that they did not have the appropriate resources/technology to support such a visit and instead preferred to proceed with a telephone visit.   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  Narrative:  The patient was contacted today by phone to discuss options of further palliative radiotherapy to the brain. In summary he was diagnosed with metastatic urothelial carcinoma from the bladder to the bone involving the pelvis and thoracic and cervical spine as well as to the liver and lung. He was treated with palliative radiotherapy in July 2020. Mid treatment he did have pain in the left tibia and simulation scan showed a very small lytic lesion in the left tibia which was treated also. He received palliative radiotherapy to this site also in July 2020. During treatment he did very well with radiotherapy and did not have significant desquamation. Imaging on 10/11/2018 revealed new compression deformities in the T8 and T9 vertebral bodies, but his  other sites of disease seem to have improved. He did have small bilateral PEs and was started on BID lovenox.  He did undergo vertebral augmentation which helped significantly. He has been on Gemzar/Carboplatin and recent CT on 12/18/2018 revealed progressive disease in the left adrenal gland, and right iliac adenopathy. He was counseled on options of immunotherapy and was in the process of getting started with this. Unfortunately he presented on 01/17/2019 with symptoms of headaches with nausea and vomiting to the ED. 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  He spoke with Dr. [name], who reiterated the poor prognosis and low likelihood that he would respond to immunotherapy.  Options that were discussed were to consider hospice versus palliative radiotherapy.  He is contacted by phone today to discuss options.  On review of systems, the patient reports that he is doing okay.  He just started taking dexamethasone 4 mg twice a day yesterday, and has not noticed any significant improvement of his nausea or vomiting and reports that he vomited this morning.  He has had persistent headaches.  His significant other Rodney [name] who is always accompanied him for discussions states that he has become confused and last night had an episode of confusion.  They are struggling with how to go about moving forward with his care, they are understandably quite disappointed that his treatments have not responded and that he now has disease in the brain.  No other complaints  are verbalized.  Past Medical History:  Past Medical History:  Diagnosis Date  . Anxiety   . Bladder tumor   . Blood in urine    saw some 3 days ago but none inthe last 2 days   . Bradycardia   . Depression   . Dizziness    historical     . Elevated PSA   . Family history of gastric cancer   . Heartburn    occasional   . Pain with urination   . Prostate cancer ([place])   . Urothelial cancer [facility])     Past Surgical History: Past Surgical History:  Procedure Laterality Date  . BACK SURGERY  2012   3 disc; dr Carloyn Manner  did first and dr elsner did the last 2   . CYSTOSCOPY W/ RETROGRADES N/A 07/02/2017   Procedure: CYSTOSCOPY WITH RETROGRADE PYELOGRAM/EXAM UNDER ANESTHESIA;  Surgeon: Raynelle Bring, MD;  Location: [facility];  Service: Urology;  Laterality: N/A;  . CYSTOSCOPY WITH BIOPSY N/A 08/09/2017   Procedure: CYSTOSCOPY WITH PROSTATE NEEDLE BIOPSY;  Surgeon: Raynelle Bring, MD;  Location: [facility];  Service: Urology;  Laterality: N/A;  GENERAL ANESTHESIA WITH PARALYSIS/ ONLY NEEDS 60 MIN FOR ALL PROCEDURES  . IR BONE TUMOR(S)RF ABLATION  11/29/2018  . IR BONE TUMOR(S)RF ABLATION  11/29/2018  . IR IMAGING GUIDED PORT INSERTION  08/07/2018  . IR KYPHO EA ADDL LEVEL THORACIC OR LUMBAR  11/29/2018  . IR KYPHO THORACIC WITH BONE BIOPSY  11/29/2018  . IR RADIOLOGIST EVAL & MGMT  11/14/2018  . LEG SURGERY  2012   4 metal plates in plates   . TRANSRECTAL ULTRASOUND N/A 08/09/2017   Procedure: TRANSRECTAL ULTRASOUND;  Surgeon: Raynelle Bring, MD;  Location: [facility];  Service: Urology;  Laterality: N/A;  ONLY NEEDS 60 MIN FOR ALL PROCEDURES  . TRANSURETHRAL RESECTION OF BLADDER TUMOR N/A 07/02/2017   Procedure: TRANSURETHRAL RESECTION OF BLADDER TUMOR (TURBT);  Surgeon: Raynelle Bring, MD;  Location: [facility];  Service: Urology;  Laterality: N/A;  . TRANSURETHRAL RESECTION OF BLADDER TUMOR N/A 08/09/2017   Procedure: TRANSURETHRAL RESECTION OF BLADDER TUMOR (TURBT);  Surgeon: Raynelle Bring, MD;  Location: [facility];  Service: Urology;  Laterality: N/A;    Social History:  Social History   Socioeconomic History  . Marital status: Significant Other    Spouse name: Not on file  . Number of children: Not on file  . Years of education: Not on  file  . Highest education level: Not on file  Occupational History  . Not on file  Tobacco Use  . Smoking status: Former Smoker    Packs/day: 2.00    Years: 8.00    Pack years: 16.00    Types: Cigarettes    Quit date: 05/29/2016    Years since quitting: 2.6  . Smokeless tobacco: Former Systems developer  . Tobacco comment: quti 2009  Substance and Sexual Activity  . Alcohol use: Not Currently  . Drug use: Not Currently    Types: Marijuana    Comment: last use was 05-29-2017  . Sexual activity: Yes  Other Topics Concern  . Not on file  Social History Narrative  . Not on file   Social Determinants of Health   Financial Resource Strain:   . Difficulty of Paying Living Expenses: Not on file  Food Insecurity:   . Worried About Charity fundraiser in the Last Year: Not on file  . Ran Out of Food in the Last Year: Not on file  Transportation Needs:   . Film/video editor (Medical): Not on file  . Lack of Transportation (Non-Medical): Not on file  Physical Activity:   . Days of Exercise per Week: Not on file  . Minutes of Exercise per Session: Not on file  Stress:   . Feeling of Stress : Not on file  Social Connections:   . Frequency of Communication with Friends and Family: Not on file  . Frequency of Social Gatherings with Friends and Family: Not on file  . Attends Religious Services: Not on file  . Active Member of Clubs or Organizations: Not on file  . Attends Archivist Meetings: Not on file  . Marital Status: Not on file  Intimate Partner Violence:   . Fear of Current or Ex-Partner: Not on file  . Emotionally Abused: Not on file  . Physically Abused: Not on file  . Sexually Abused: Not on file  The patient is in a relationship and lives in [place].  He has 2 daughters.  Family History: Family History  Problem Relation Age of Onset  . Heart attack Father   . Hypertension Father   . Diabetes Father   . Heart failure Father   . Hypertension Mother   . Gastric  cancer Sister 63       d. 45  . Prostate cancer Brother 66  . Melanoma Maternal Aunt   . Stroke Maternal Uncle   . Stroke Paternal Uncle   . Melanoma Maternal Grandmother        dx under 41  . Colon cancer Maternal Grandmother   . Heart disease Maternal Grandfather   . Heart attack Paternal Grandfather    Allergies:  Allergies  Allergen Reactions  . Drug Class [Iodinated Diagnostic Agents] Nausea And Vomiting  . Morphine And Related Itching  . Vicodin [Hydrocodone-Acetaminophen] Itching   Medications:  Current Outpatient Medications  Medication Sig Dispense Refill  . dexamethasone (DECADRON) 4 MG tablet Take 1 tablet (4 mg total) by mouth 2 (two) times daily. 60 tablet 1  . dronabinol (MARINOL) 2.5 MG capsule Take 1 capsule (2.5 mg total) by mouth 2 (two) times daily before lunch and supper. 30 capsule 0  . DULoxetine (CYMBALTA) 30 MG capsule Take 1 capsule (30 mg total) by mouth daily. 90 capsule 3  . enoxaparin (LOVENOX) 60 MG/0.6ML injection Inject 0.6 mLs (60 mg total) into the skin every 12 (twelve) hours. 36 mL 2  . fentaNYL (DURAGESIC) 100 MCG/HR Place 1 patch onto the skin every 3 (three) days. (Patient taking differently: Place 0.5 patches onto the skin every 3 (three) days. ) 10 patch 0  . HYDROmorphone (DILAUDID) 2 MG tablet TAKE TWO TABLETS BY MOUTH EVERY 4 HOURS AS NEEDED FOR SEVERE PAIN 90 tablet 0  . lidocaine-prilocaine (EMLA) cream Apply 1 application topically as needed. (Patient taking differently: Apply 1 application topically as needed (access). ) 30 g 0  . LORazepam (ATIVAN) 0.5 MG tablet Place 1 tablet (0.5 mg total) under the tongue every 8 (eight) hours as needed (nausea). 35 tablet 0  . Multiple Vitamin (MULTIVITAMIN WITH MINERALS) TABS tablet Take 1 tablet by mouth daily.    . ondansetron (ZOFRAN) 4 MG tablet TAKE 1 TABLET BY MOUTH EVERY 6 HOURS AS NEEDED FOR NAUSEA (Patient taking differently: Take 4 mg by mouth every 6 (six) hours as needed for nausea or  vomiting. ) 20 tablet 0  . pantoprazole (PROTONIX) 40 MG tablet Take 1 tablet (40 mg total) by mouth daily.  30 tablet 0  . prochlorperazine (COMPAZINE) 10 MG tablet TAKE 1 TABLET BY MOUTH EVERY 6 HOURS AS NEEDED FOR NAUSEA AND VOMITING 30 tablet 0  . promethazine (PHENERGAN) 25 MG tablet TAKE 1 TABLET BY MOUTH EVERY 6 HOURS AS NEEDED FOR NAUSEA OR VOMITING (Patient taking differently: Take 25 mg by mouth every 6 (six) hours as needed for nausea or vomiting. ) 30 tablet 0  . senna-docusate (SENOKOT-S) 8.6-50 MG tablet Take 2 tablets by mouth 2 (two) times daily. (Patient taking differently: Take 2 tablets by mouth daily. ) 60 tablet 0   No current facility-administered medications for this encounter.   Physical Exam: Unable to assess due to encounter type.  Impression/Plan: 1. 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  The patient situation is quite unfortunate.  We discussed the nature of leptomeningeal disease, the spread, and the prognosis.  Dr. [name] has reviewed his case and discussed that options for radiotherapy would include treatment to the posterior fossa versus craniospinal irradiation.  If the patient wishes to be aggressive about treatment, he would recommend an MRI of the entire spine to further determine how we would offer treatment.  I discussed the symptoms of leptomeningeal disease if left untreated, and the patient and his significant other are interested in keeping quality of life at the forefront of his care.  We discussed options of meeting with hospice, as well as the option of having hospice immediately following radiotherapy.  At the conclusion of our discussion, the patient would like to meet with hospice in [facility] in a referral was placed so that they can determine eligibility.  We will also proceed with an MRI as he is interested in palliative radiotherapy in some form if he is eligible, and then picking up with hospice at the conclusion of  radiotherapy.  The patient unfortunately also has had a exposure to a Covid positive person in his family over the holidays.  I will follow-up with him once we know the results of his Covid test as this plays a role about the timing of his care. 2. CODE STATUS.  Regardless of the ongoing Covid pandemic, we discussed the concerns about how severe his disease is.  He has already been taking measures to document advanced directives though he has not completed these.  He states that he would not want resuscitation if his heart or lungs stopped functioning.  With his permission I have placed an order for his CODE STATUS to reflect DNR. 3. Covid exposure.  The patient was exposed approximately 7 days ago to someone who later tested positive 5 days ago.  Because of the timing I recommended that he and his significant other have testing.  They were given the contact number to schedule this at either the [facility] or [facility] testing site.  Hopefully we will have results later in the week, and if he is negative then he could come out of quarantine.  Otherwise he understands that he should remain in quarantine.    Given current concerns for patient exposure during the COVID-19 pandemic, this encounter was conducted via telephone.  The patient has given verbal consent for this type of encounter. The time spent during this encounter was 50 minutes and 50% of that time was spent in the coordination of his care. The attendants for this meeting included  Rodney [name], [place],  Rodney [name], and Rodney [name]. During the encounter Rodney [name] [facility] was located at [facility]  Radiation Oncology Department.  Rodney [name]  was located at home along with Rodney [name].    Carola Rhine, PAC

## 2019-01-22 ENCOUNTER — Ambulatory Visit (HOSPITAL_COMMUNITY): Payer: PRIVATE HEALTH INSURANCE

## 2019-01-22 ENCOUNTER — Ambulatory Visit: Payer: PRIVATE HEALTH INSURANCE | Attending: Internal Medicine

## 2019-01-22 ENCOUNTER — Telehealth: Payer: Self-pay

## 2019-01-22 DIAGNOSIS — Z20822 Contact with and (suspected) exposure to covid-19: Secondary | ICD-10-CM

## 2019-01-22 NOTE — Telephone Encounter (Signed)
-----   Message from [name], MD sent at 01/22/2019 11:00 AM EST ----- Regarding: RE: Patient covid exposure Please cancel these appointments and will be rescheduled pending these results. Thanks ----- Message ----- From: Teodoro Spray, RN Sent: 01/22/2019  10:52 AM EST To: [name], MD Subject: Patient covid exposure                         Patient's significant other called stating patient was exposed to covid on 01/12/19. Patient was tested for covid this morning but result may not be available until Friday 01/24/19 at the earliest.  Patient is scheduled for lab, flush, MD and infusion on 01/23/19.  Would you like me to reschedule patient's appointments?   Please advise.

## 2019-01-22 NOTE — Telephone Encounter (Signed)
Please see information below.  Appointments for 01/23/19 have been canceled. Patient instructed to call office once he received Covid test results. Patient verbalized understanding.

## 2019-01-23 ENCOUNTER — Ambulatory Visit: Payer: PRIVATE HEALTH INSURANCE | Admitting: Oncology

## 2019-01-23 ENCOUNTER — Telehealth: Payer: Self-pay | Admitting: Radiation Therapy

## 2019-01-23 ENCOUNTER — Other Ambulatory Visit: Payer: PRIVATE HEALTH INSURANCE

## 2019-01-23 ENCOUNTER — Ambulatory Visit: Payer: PRIVATE HEALTH INSURANCE

## 2019-01-23 ENCOUNTER — Telehealth: Payer: Self-pay

## 2019-01-23 NOTE — Telephone Encounter (Signed)
Checking on COVID 19 results, not available yet.

## 2019-01-23 NOTE — Telephone Encounter (Signed)
Spoke with [name] about his upcoming spine MRI on 1/11. He has the appointment down and plans to attend.  [facility] D[facility] R.T.(R)(T) Radiation Special Procedures Navigator

## 2019-01-24 ENCOUNTER — Telehealth: Payer: Self-pay

## 2019-01-24 ENCOUNTER — Telehealth: Payer: Self-pay | Admitting: *Deleted

## 2019-01-24 ENCOUNTER — Telehealth: Payer: Self-pay | Admitting: Radiation Therapy

## 2019-01-24 LAB — NOVEL CORONAVIRUS, NAA: SARS-CoV-2, NAA: NOT DETECTED

## 2019-01-24 NOTE — Telephone Encounter (Signed)
-----   Message from [name], MD sent at 01/24/2019  8:27 AM EST ----- Regarding: RE: Patient covid exposure Keep as is for now. He will be getting radiation for now. Thanks ----- Message ----- From: Teodoro Spray, RN Sent: 01/24/2019   8:24 AM EST To: [name], MD Subject: FW: Patient covid exposure                     Update since message that was sent below.  Patient has since tested negative.  His next scheduled appointment to see you is on 02/13/19.  Would you like me to reschedule his missed appointments from this week? And if so, will his appointments on 02/13/19 need to be rescheduled as well?  Please advise.   ----- Message ----- From: Teodoro Spray, RN Sent: 01/22/2019  10:52 AM EST To: [name], MD Subject: Patient covid exposure                         Patient's significant other called stating patient was exposed to covid on 01/12/19. Patient was tested for covid this morning but result may not be available until Friday 01/24/19 at the earliest.  Patient is scheduled for lab, flush, MD and infusion on 01/23/19.  Would you like me to reschedule patient's appointments?   Please advise.

## 2019-01-24 NOTE — Telephone Encounter (Signed)
Returned patient's sgo phone call, spoke with patient's sgo

## 2019-01-24 NOTE — Telephone Encounter (Signed)
Called and spoke to patient's friend Lynett Fish) who is ok the patient's HIPPA form. Informed Ms. Powell of Dr. Hazeline Junker response in regards to rescheduling patient's missed appointments. Friend verbalized understanding and will relay information to patient.

## 2019-01-24 NOTE — Telephone Encounter (Signed)
Shared results of negative Covid test with the patient and reviewed his upcoming schedule for next week.  Mont Dutton R.T.(R)(T) Radiation Special Procedures Navigator

## 2019-01-27 ENCOUNTER — Other Ambulatory Visit: Payer: Self-pay

## 2019-01-27 ENCOUNTER — Other Ambulatory Visit: Payer: Self-pay | Admitting: Oncology

## 2019-01-27 ENCOUNTER — Ambulatory Visit (HOSPITAL_COMMUNITY)
Admission: RE | Admit: 2019-01-27 | Discharge: 2019-01-27 | Disposition: A | Discharge status: Home / Self Care | Payer: PRIVATE HEALTH INSURANCE | Source: Ambulatory Visit | Attending: Radiation Oncology | Admitting: Radiation Oncology

## 2019-01-27 DIAGNOSIS — C679 Malignant neoplasm of bladder, unspecified: Secondary | ICD-10-CM | POA: Diagnosis not present

## 2019-01-27 MED ORDER — GADOBUTROL 1 MMOL/ML IV SOLN
7.0000 mL | Freq: Once | INTRAVENOUS | Status: AC | PRN
  Administered 2019-01-27: 7 mL via INTRAVENOUS

## 2019-01-28 ENCOUNTER — Other Ambulatory Visit: Payer: Self-pay

## 2019-01-28 ENCOUNTER — Telehealth: Payer: Self-pay | Admitting: Radiation Oncology

## 2019-01-28 ENCOUNTER — Ambulatory Visit
Admission: RE | Admit: 2019-01-28 | Discharge: 2019-01-28 | Disposition: A | Discharge status: Home / Self Care | Payer: PRIVATE HEALTH INSURANCE | Source: Ambulatory Visit | Attending: Radiation Oncology | Admitting: Radiation Oncology

## 2019-01-28 DIAGNOSIS — C7931 Secondary malignant neoplasm of brain: Secondary | ICD-10-CM | POA: Insufficient documentation

## 2019-01-28 DIAGNOSIS — Z510 Encounter for antineoplastic radiation therapy: Secondary | ICD-10-CM | POA: Insufficient documentation

## 2019-01-28 DIAGNOSIS — C679 Malignant neoplasm of bladder, unspecified: Secondary | ICD-10-CM | POA: Insufficient documentation

## 2019-01-28 DIAGNOSIS — C7951 Secondary malignant neoplasm of bone: Secondary | ICD-10-CM | POA: Diagnosis not present

## 2019-01-28 DIAGNOSIS — C7932 Secondary malignant neoplasm of cerebral meninges: Secondary | ICD-10-CM

## 2019-01-28 NOTE — Progress Notes (Signed)
I spoke with Dr. Tammi Klippel in Dr. Ida Rogue absence. He reviewed the patient's case. Since the patient is not having neurologic decline in the extremities or trunk, given the poor prognosis, he would favor treating posterior fossa rather than craniospinal therapy given the risks and side effects, but that we could revisit this discussion if the patient developed neurologic symptoms. The patient is aware and has signed consent for treatment of the brain, specifically the posterior fossa over 10 fractions.

## 2019-01-28 NOTE — Telephone Encounter (Signed)
I spoke with Mr. Mcrill and Lynett Fish with [facility]. He has not heard from hospice of [facility] so we will reach out to them. He does want to proceed and I reviewed his spine imaging with him and that I suspect Dr. [name] will offer craniospinal radiotherapy. He will come in to simulate later today and we will review consent at that time. He does want to let us know he has had some symptoms of syncope and after hearing his description I think this is due to orthostasis. He will notify us if this happens again or be seen in an urgent setting for evaluation. He does have episodes of confusion and sometimes feeling shaky. If his symptoms progress he is to let us know otherwise to continue his dexamethasone as previously prescribed.

## 2019-01-29 ENCOUNTER — Encounter: Payer: Self-pay | Admitting: Radiation Oncology

## 2019-01-29 ENCOUNTER — Ambulatory Visit (HOSPITAL_COMMUNITY): Payer: PRIVATE HEALTH INSURANCE

## 2019-01-29 DIAGNOSIS — C7932 Secondary malignant neoplasm of cerebral meninges: Secondary | ICD-10-CM | POA: Insufficient documentation

## 2019-01-30 DIAGNOSIS — Z510 Encounter for antineoplastic radiation therapy: Secondary | ICD-10-CM | POA: Diagnosis not present

## 2019-02-01 ENCOUNTER — Encounter (HOSPITAL_COMMUNITY): Payer: Self-pay | Admitting: Emergency Medicine

## 2019-02-01 ENCOUNTER — Emergency Department (HOSPITAL_COMMUNITY): Payer: PRIVATE HEALTH INSURANCE

## 2019-02-01 ENCOUNTER — Other Ambulatory Visit: Payer: Self-pay

## 2019-02-01 ENCOUNTER — Inpatient Hospital Stay (HOSPITAL_COMMUNITY)
Admission: EM | Admit: 2019-02-01 | Discharge: 2019-02-02 | DRG: 071 | Disposition: A | Discharge status: Hospice | Payer: PRIVATE HEALTH INSURANCE | Attending: Internal Medicine | Admitting: Internal Medicine

## 2019-02-01 DIAGNOSIS — Z86711 Personal history of pulmonary embolism: Secondary | ICD-10-CM

## 2019-02-01 DIAGNOSIS — Z9221 Personal history of antineoplastic chemotherapy: Secondary | ICD-10-CM

## 2019-02-01 DIAGNOSIS — S0181XA Laceration without foreign body of other part of head, initial encounter: Secondary | ICD-10-CM | POA: Diagnosis present

## 2019-02-01 DIAGNOSIS — R12 Heartburn: Secondary | ICD-10-CM | POA: Diagnosis present

## 2019-02-01 DIAGNOSIS — D72829 Elevated white blood cell count, unspecified: Secondary | ICD-10-CM | POA: Diagnosis present

## 2019-02-01 DIAGNOSIS — Z66 Do not resuscitate: Secondary | ICD-10-CM | POA: Diagnosis present

## 2019-02-01 DIAGNOSIS — Z8551 Personal history of malignant neoplasm of bladder: Secondary | ICD-10-CM

## 2019-02-01 DIAGNOSIS — G9341 Metabolic encephalopathy: Secondary | ICD-10-CM | POA: Diagnosis not present

## 2019-02-01 DIAGNOSIS — Z87891 Personal history of nicotine dependence: Secondary | ICD-10-CM | POA: Diagnosis not present

## 2019-02-01 DIAGNOSIS — Z8546 Personal history of malignant neoplasm of prostate: Secondary | ICD-10-CM

## 2019-02-01 DIAGNOSIS — I10 Essential (primary) hypertension: Secondary | ICD-10-CM | POA: Diagnosis present

## 2019-02-01 DIAGNOSIS — C7932 Secondary malignant neoplasm of cerebral meninges: Secondary | ICD-10-CM | POA: Diagnosis present

## 2019-02-01 DIAGNOSIS — C679 Malignant neoplasm of bladder, unspecified: Secondary | ICD-10-CM | POA: Diagnosis present

## 2019-02-01 DIAGNOSIS — W1830XA Fall on same level, unspecified, initial encounter: Secondary | ICD-10-CM | POA: Diagnosis present

## 2019-02-01 DIAGNOSIS — F419 Anxiety disorder, unspecified: Secondary | ICD-10-CM | POA: Diagnosis present

## 2019-02-01 DIAGNOSIS — Z781 Physical restraint status: Secondary | ICD-10-CM | POA: Diagnosis not present

## 2019-02-01 DIAGNOSIS — Z87440 Personal history of urinary (tract) infections: Secondary | ICD-10-CM | POA: Diagnosis not present

## 2019-02-01 DIAGNOSIS — Z20822 Contact with and (suspected) exposure to covid-19: Secondary | ICD-10-CM | POA: Diagnosis present

## 2019-02-01 DIAGNOSIS — Z79899 Other long term (current) drug therapy: Secondary | ICD-10-CM | POA: Diagnosis not present

## 2019-02-01 DIAGNOSIS — F329 Major depressive disorder, single episode, unspecified: Secondary | ICD-10-CM | POA: Diagnosis present

## 2019-02-01 DIAGNOSIS — S0081XA Abrasion of other part of head, initial encounter: Secondary | ICD-10-CM

## 2019-02-01 DIAGNOSIS — C61 Malignant neoplasm of prostate: Secondary | ICD-10-CM | POA: Diagnosis present

## 2019-02-01 DIAGNOSIS — Z906 Acquired absence of other parts of urinary tract: Secondary | ICD-10-CM

## 2019-02-01 DIAGNOSIS — G9340 Encephalopathy, unspecified: Secondary | ICD-10-CM

## 2019-02-01 DIAGNOSIS — Z885 Allergy status to narcotic agent status: Secondary | ICD-10-CM

## 2019-02-01 DIAGNOSIS — Z8042 Family history of malignant neoplasm of prostate: Secondary | ICD-10-CM

## 2019-02-01 DIAGNOSIS — C799 Secondary malignant neoplasm of unspecified site: Secondary | ICD-10-CM

## 2019-02-01 DIAGNOSIS — Z91041 Radiographic dye allergy status: Secondary | ICD-10-CM

## 2019-02-01 LAB — CBG MONITORING, ED: Glucose-Capillary: 138 mg/dL — ABNORMAL HIGH (ref 70–99)

## 2019-02-01 LAB — CBC WITH DIFFERENTIAL/PLATELET
Abs Immature Granulocytes: 0.27 10*3/uL — ABNORMAL HIGH (ref 0.00–0.07)
Basophils Absolute: 0.1 10*3/uL (ref 0.0–0.1)
Basophils Relative: 0 %
Eosinophils Absolute: 0 10*3/uL (ref 0.0–0.5)
Eosinophils Relative: 0 %
HCT: 38.8 % — ABNORMAL LOW (ref 39.0–52.0)
Hemoglobin: 13.3 g/dL (ref 13.0–17.0)
Immature Granulocytes: 2 %
Lymphocytes Relative: 12 %
Lymphs Abs: 1.9 10*3/uL (ref 0.7–4.0)
MCH: 34.5 pg — ABNORMAL HIGH (ref 26.0–34.0)
MCHC: 34.3 g/dL (ref 30.0–36.0)
MCV: 100.5 fL — ABNORMAL HIGH (ref 80.0–100.0)
Monocytes Absolute: 1 10*3/uL (ref 0.1–1.0)
Monocytes Relative: 6 %
Neutro Abs: 12.2 10*3/uL — ABNORMAL HIGH (ref 1.7–7.7)
Neutrophils Relative %: 80 %
Platelets: 306 10*3/uL (ref 150–400)
RBC: 3.86 MIL/uL — ABNORMAL LOW (ref 4.22–5.81)
RDW: 15.6 % — ABNORMAL HIGH (ref 11.5–15.5)
WBC: 15.4 10*3/uL — ABNORMAL HIGH (ref 4.0–10.5)
nRBC: 0 % (ref 0.0–0.2)

## 2019-02-01 LAB — BASIC METABOLIC PANEL
Anion gap: 7 (ref 5–15)
BUN: 37 mg/dL — ABNORMAL HIGH (ref 6–20)
CO2: 22 mmol/L (ref 22–32)
Calcium: 10.2 mg/dL (ref 8.9–10.3)
Chloride: 109 mmol/L (ref 98–111)
Creatinine, Ser: 0.89 mg/dL (ref 0.61–1.24)
GFR calc Af Amer: >60 mL/min (ref >60)
GFR calc non Af Amer: >60 mL/min (ref >60)
Glucose, Bld: 116 mg/dL — ABNORMAL HIGH (ref 70–99)
Potassium: 4.3 mmol/L (ref 3.5–5.1)
Sodium: 138 mmol/L (ref 135–145)

## 2019-02-01 LAB — AMMONIA: Ammonia: 26 umol/L (ref 9–35)

## 2019-02-01 MED ORDER — LACTATED RINGERS IV BOLUS
1000.0000 mL | Freq: Once | INTRAVENOUS | Status: AC
  Administered 2019-02-01: 22:00:00 1000 mL via INTRAVENOUS

## 2019-02-01 MED ORDER — BACITRACIN ZINC 500 UNIT/GM EX OINT
TOPICAL_OINTMENT | Freq: Once | CUTANEOUS | Status: AC
  Filled 2019-02-01: qty 0.9

## 2019-02-01 NOTE — ED Notes (Addendum)
Pt laceration on left upper eyebrow was cleaned with sterile saline and gauze. Nurse aware.

## 2019-02-01 NOTE — ED Notes (Signed)
Patient walked out of lobby to wait for his ride-was found down, fell and hit head-abrasion/laceration to left eye

## 2019-02-01 NOTE — ED Notes (Signed)
Pt pulled out IV

## 2019-02-01 NOTE — ED Provider Notes (Signed)
[facility] DEPT Provider Note   CSN: [id number] Arrival date & time: 02/01/19  1513     History Chief Complaint  Patient presents with  . Gait Problem    Rodney [name] is a 49 y.o. male.  HPI  History primarily via phone with his daughter Nira Conn and review of records. Daughterdropped him off today for evaluation of confusion. He was initially in the waiting room and subsequently walked out, fell and was brought back in. Per daughter, pt has been increasingly confused since yesterday. Much worse today. Saying things that don't need sense. Daughter was driving today and he tried getting out of a moving vehicle. She says he lives with her girlfriend and she is in charge of medications. She has no significant concern for possible inappropriate med use. No reported fever. Daughter not aware of head wound. She says he is very weak and uses a walker. He fell once yesterday but didn't have apparent injury from this.   Past Medical History:  Diagnosis Date  . Anxiety   . Bladder tumor   . Blood in urine    saw some 3 days ago but none inthe last 2 days   . Bradycardia   . Depression   . Dizziness    historical   . Elevated PSA   . Family history of gastric cancer   . Heartburn    occasional   . Pain with urination   . Prostate cancer ([place])   . 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 Pulmonary embolism ([place]) 10/11/2018  . Palliative care by specialist   . Hypokalemia 09/08/2018  . Hypophosphatemia 09/08/2018  . Acute metabolic encephalopathy 0000000  . Anemia associated with chemotherapy 09/08/2018  . Leukopenia due to antineoplastic chemotherapy ([place]) 09/08/2018  . Bacteremia 09/08/2018  . AKI (acute kidney injury) ([place])   . Failure to thrive in adult   . UTI (urinary tract infection) 09/07/2018  . ARF (acute renal failure)  ([place]) 09/07/2018  . Dehydration 09/07/2018  . Hypernatremia 09/07/2018  . AMS (altered mental status) 09/07/2018  . FTT (failure to thrive) in adult 09/07/2018  . Metabolic acidosis [id number]  . Acute hypernatremia 09/07/2018  . Odynophagia 09/07/2018  . Oral thrush 09/07/2018  . Port-A-Cath in place 08/13/2018  . 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 Pain   . Metastatic cancer ([place])   . Goals of care, counseling/discussion   . Intractable pain 07/20/2018  . Constipation 07/20/2018  . Acute lower UTI 07/20/2018  . Leukocytosis 07/20/2018  . Secondary malignancy of vertebral column ([place]) 07/16/2018  . 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 Bladder cancer ([place]) 11/08/2017  . Genetic testing 10/26/2017  . Family history of prostate cancer   . Family history of gastric cancer   . Family history of melanoma   . Family history of colon cancer   . Urothelial cancer ([place])   . Malignant neoplasm of prostate ([place]) 09/11/2017    Past Surgical History:  Procedure Laterality Date  . BACK SURGERY  2012   3 disc; dr Carloyn Manner  did first and dr elsner did the last 2   . CYSTOSCOPY W/ RETROGRADES N/A 07/02/2017   Procedure: CYSTOSCOPY WITH RETROGRADE PYELOGRAM/EXAM UNDER ANESTHESIA;  Surgeon: Raynelle Bring, MD;  Location: [facility];  Service: Urology;  Laterality: N/A;  .  CYSTOSCOPY WITH BIOPSY N/A 08/09/2017   Procedure: CYSTOSCOPY WITH PROSTATE NEEDLE BIOPSY;  Surgeon: Raynelle Bring, MD;  Location: [facility];  Service: Urology;  Laterality: N/A;  GENERAL ANESTHESIA WITH PARALYSIS/ ONLY NEEDS 60 MIN FOR ALL PROCEDURES  . IR BONE TUMOR(S)RF ABLATION  11/29/2018  . IR BONE TUMOR(S)RF ABLATION  11/29/2018  . IR IMAGING GUIDED PORT INSERTION  08/07/2018  . IR KYPHO EA ADDL LEVEL THORACIC OR LUMBAR  11/29/2018  . IR KYPHO THORACIC WITH BONE BIOPSY  11/29/2018  . IR RADIOLOGIST EVAL & MGMT  11/14/2018  . LEG SURGERY  2012   4 metal  plates in plates   . TRANSRECTAL ULTRASOUND N/A 08/09/2017   Procedure: TRANSRECTAL ULTRASOUND;  Surgeon: Raynelle Bring, MD;  [facility];  Service: Urology;  Laterality: N/A;  ONLY NEEDS 60 MIN FOR ALL PROCEDURES  . TRANSURETHRAL RESECTION OF BLADDER TUMOR N/A 07/02/2017   Procedure: TRANSURETHRAL RESECTION OF BLADDER TUMOR (TURBT);  Surgeon: Raynelle Bring, MD;  [facility];  Service: Urology;  Laterality: N/A;  . TRANSURETHRAL RESECTION OF BLADDER TUMOR N/A 08/09/2017   Procedure: TRANSURETHRAL RESECTION OF BLADDER TUMOR (TURBT);  Surgeon: Raynelle Bring, MD;  Location[facility];  Service: Urology;  Laterality: N/A;       Family History  Problem Relation Age of Onset  . Heart attack Father   . Hypertension Father   . Diabetes Father   . Heart failure Father   . Hypertension Mother   . Gastric cancer Sister 47       d. 50  . Prostate cancer Brother 75  . Melanoma Maternal Aunt   . Stroke Maternal Uncle   . Stroke Paternal Uncle   . Melanoma Maternal Grandmother        dx under 54  . Colon cancer Maternal Grandmother   . Heart disease Maternal Grandfather   . Heart attack Paternal Grandfather     Social History   Tobacco Use  . Smoking status: Former Smoker    Packs/day: 2.00    Years: 8.00    Pack years: 16.00    Types: Cigarettes    Quit date: 05/29/2016    Years since quitting: 2.6  . Smokeless tobacco: Former Systems developer  . Tobacco comment: quti 2009  Substance Use Topics  . Alcohol use: Not Currently  . Drug use: Not Currently    Types: Marijuana    Comment: last use was 05-29-2017    Home Medications Prior to Admission medications   Medication Sig Start Date End Date Taking? Authorizing Provider  dexamethasone (DECADRON) 4 MG tablet Take 1 tablet (4 mg total) by mouth 2 (two) times daily. 01/20/19   [name], MD  dronabinol (MARINOL) 2.5 MG capsule Take 1 capsule (2.5 mg total) by mouth 2 (two) times daily before lunch and supper. 11/27/18   [name], MD  DULoxetine (CYMBALTA) 30 MG capsule Take 1 capsule (30 mg total) by mouth daily. 10/16/18   [name], MD  enoxaparin (LOVENOX) 60 MG/0.6ML injection Inject 0.6 mLs (60 mg total) into the skin every 12 (twelve) hours. 10/13/18 01/17/19  Dessa Phi, DO  fentaNYL (DURAGESIC) 100 MCG/HR Place 1 patch onto the skin every 3 (three) days. Patient taking differently: Place 0.5 patches onto the skin every 3 (three) days.  11/27/18   [name], MD  HYDROmorphone (DILAUDID) 2 MG tablet TAKE TWO TABLETS BY MOUTH EVERY 4 HOURS AS NEEDED FOR SEVERE PAIN 01/20/19   [name], MD  lidocaine-prilocaine (EMLA) cream Apply 1 application topically as needed. Patient taking differently: Apply 1 application topically as needed (access).  08/05/18   [name], MD  LORazepam (ATIVAN) 0.5 MG tablet Place 1 tablet (0.5 mg total) under the tongue every 8 (eight) hours as needed (nausea). 11/27/18   [name], MD  Multiple Vitamin (MULTIVITAMIN WITH MINERALS) TABS tablet Take 1 tablet by mouth daily.    [provider]  ondansetron (ZOFRAN) 4 MG tablet TAKE 1 TABLET BY MOUTH EVERY 6 HOURS AS NEEDED FOR NAUSEA Patient taking differently: Take 4 mg by mouth every 6 (six) hours as needed for nausea or vomiting.  11/26/18   [name], MD  pantoprazole (PROTONIX) 40 MG tablet Take 1 tablet (40 mg total) by mouth daily. 12/26/18   [name], MD  prochlorperazine (COMPAZINE) 10 MG tablet TAKE 1 TABLET BY MOUTH EVERY 6 HOURS AS NEEDED FOR NAUSEA AND VOMITING 01/28/19   [name], MD  promethazine (PHENERGAN) 25 MG tablet TAKE 1 TABLET BY MOUTH EVERY 6 HOURS AS NEEDED FOR NAUSEA OR VOMITING 01/28/19   [name], MD  senna-docusate (SENOKOT-S) 8.6-50 MG tablet Take 2 tablets by mouth 2 (two) times daily. Patient taking differently: Take 2 tablets by mouth daily.  08/02/18   Raiford Noble Latif, DO    Allergies    Drug class [iodinated diagnostic agents], Morphine  and related, and Vicodin [hydrocodone-acetaminophen]  Review of Systems   Review of Systems All systems reviewed and negative, other than as noted in HPI.  Physical Exam Updated Vital Signs BP (!) 123/102 (BP Location: Left Arm)   Pulse 94   Temp (!) 97.3 F (36.3 C) (Oral)   Resp (!) 22   SpO2 98%   Physical Exam Vitals and nursing note reviewed.  Constitutional:      General: He is not in acute distress.    Appearance: He is well-developed.  HENT:     Head: Normocephalic.     Comments: Both left eyebrow there is a small macerated laceration/abrasion.  Minimal bleeding.  Not amenable to suturing.  Does not seem to have midline spinal tenderness. Eyes:     General:        Right eye: No discharge.        Left eye: No discharge.     Conjunctiva/sclera: Conjunctivae normal.  Cardiovascular:     Rate and Rhythm: Normal rate and regular rhythm.     Heart sounds: Normal heart sounds. No murmur. No friction rub. No gallop.   Pulmonary:     Effort: Pulmonary effort is normal. No respiratory distress.     Breath sounds: Normal breath sounds.  Abdominal:     General: There is no distension.     Palpations: Abdomen is soft.     Tenderness: There is no abdominal tenderness.  Musculoskeletal:        General: No tenderness.     Cervical back: Neck supple.  Skin:    General: Skin is warm and dry.  Neurological:     Mental Status: He is alert.     Comments: Confused.  Answering questions but content relates inappropriate for what is being asked of him.  He is moving all extremities equally with good strength.  Cranial nerves II through XII seem intact.     ED Results / Procedures / Treatments   Labs (all labs ordered are listed, but only abnormal results are displayed) Labs Reviewed  CBC WITH DIFFERENTIAL/PLATELET -  Abnormal; Notable for the following components:      Result Value   WBC 15.4 (*)    RBC 3.86 (*)    HCT 38.8 (*)    MCV 100.5 (*)    MCH 34.5 (*)    RDW 15.6  (*)    Neutro Abs 12.2 (*)    Abs Immature Granulocytes 0.27 (*)    All other components within normal limits  BASIC METABOLIC PANEL - Abnormal; Notable for the following components:   Glucose, Bld 116 (*)    BUN 37 (*)    All other components within normal limits  URINALYSIS, ROUTINE W REFLEX MICROSCOPIC - Abnormal; Notable for the following components:   Hgb urine dipstick SMALL (*)    Leukocytes,Ua SMALL (*)    Non Squamous Epithelial 0-5 (*)    All other components within normal limits  COMPREHENSIVE METABOLIC PANEL - Abnormal; Notable for the following components:   BUN 34 (*)    Total Protein 6.2 (*)    Albumin 3.4 (*)    All other components within normal limits  CBC - Abnormal; Notable for the following components:   WBC 13.0 (*)    RBC 3.62 (*)    Hemoglobin 12.2 (*)    HCT 36.7 (*)    MCV 101.4 (*)    All other components within normal limits  CBG MONITORING, ED - Abnormal; Notable for the following components:   Glucose-Capillary 138 (*)    All other components within normal limits  SARS CORONAVIRUS 2 (TAT 6-24 HRS)  AMMONIA    EKG None  Radiology CT Head Wo Contrast  Result Date: 02/01/2019 CLINICAL DATA:  49 year old male with history of facial trauma after a fall. EXAM: CT HEAD WITHOUT CONTRAST CT MAXILLOFACIAL WITHOUT CONTRAST TECHNIQUE: Multidetector CT imaging of the head and maxillofacial structures were performed using the standard protocol without intravenous contrast. Multiplanar CT image reconstructions of the maxillofacial structures were also generated. COMPARISON:  Head CT 09/07/2018. FINDINGS: CT HEAD FINDINGS Brain: Patchy and confluent areas of decreased attenuation are noted throughout the deep and periventricular white matter of the cerebral hemispheres bilaterally, increased compared to the prior study from 09/07/2018. No evidence of acute infarction, hemorrhage, hydrocephalus, extra-axial collection or mass lesion/mass effect. Vascular: No  hyperdense vessel or unexpected calcification. Skull: Normal. Negative for fracture or focal lesion. Other: None. CT MAXILLOFACIAL FINDINGS Osseous: No fracture or mandibular dislocation. No destructive process. Orbits: Negative. No traumatic or inflammatory finding. Sinuses: Clear. Soft tissues: Small amount of gas in the soft tissues overlying the left periorbital region, presumably from a scalp laceration. IMPRESSION: 1. Small scalp laceration in the left periorbital region without underlying displaced facial bone or skull fracture. No evidence of significant acute intracranial trauma. 2. Progressively worsening diffuse white matter changes, increased compared to prior study from 09/07/2018. Given the patient's history of leptomeningeal metastatic disease, this may be related to evolving post radiation changes. Electronically Signed   By: Vinnie Langton M.D.   On: 02/01/2019 18:36   CT Maxillofacial Wo Contrast  Result Date: 02/01/2019 CLINICAL DATA:  49 year old male with history of facial trauma after a fall. EXAM: CT HEAD WITHOUT CONTRAST CT MAXILLOFACIAL WITHOUT CONTRAST TECHNIQUE: Multidetector CT imaging of the head and maxillofacial structures were performed using the standard protocol without intravenous contrast. Multiplanar CT image reconstructions of the maxillofacial structures were also generated. COMPARISON:  Head CT 09/07/2018. FINDINGS: CT HEAD FINDINGS Brain: Patchy and confluent areas of decreased attenuation are noted throughout the deep and  periventricular white matter of the cerebral hemispheres bilaterally, increased compared to the prior study from 09/07/2018. No evidence of acute infarction, hemorrhage, hydrocephalus, extra-axial collection or mass lesion/mass effect. Vascular: No hyperdense vessel or unexpected calcification. Skull: Normal. Negative for fracture or focal lesion. Other: None. CT MAXILLOFACIAL FINDINGS Osseous: No fracture or mandibular dislocation. No destructive  process. Orbits: Negative. No traumatic or inflammatory finding. Sinuses: Clear. Soft tissues: Small amount of gas in the soft tissues overlying the left periorbital region, presumably from a scalp laceration. IMPRESSION: 1. Small scalp laceration in the left periorbital region without underlying displaced facial bone or skull fracture. No evidence of significant acute intracranial trauma. 2. Progressively worsening diffuse white matter changes, increased compared to prior study from 09/07/2018. Given the patient's history of leptomeningeal metastatic disease, this may be related to evolving post radiation changes. Electronically Signed   By: Vinnie Langton M.D.   On: 02/01/2019 18:36    Procedures Procedures (including critical care time)  Medications Ordered in ED Medications  bacitracin ointment ( Topical Given 02/01/19 1831)    ED Course  I have reviewed the triage vital signs and the nursing notes.  Pertinent labs & imaging results that were available during my care of the patient were reviewed by me and considered in my medical decision making (see chart for details).    MDM Rules/Calculators/A&P                      48yM with confusion. Hx of metastatic bladder cancer. Symptoms from worsening leptomeningeal disease? Recent oncology notes mentioning that any treatments would be directed to palliating his symptoms. Daughter says symptoms worsening over last day. He is very confused. Answering questions but disoriented to place and time. Answers mostly no appropriate. He pulled out one of his IVs and is needing frequent redirection as he keeps trying to get out of the bed. He has already fallen once today. CT head/neck w/o significant acute traumatic injury. Wound above L eye not amenable to repair. Has a leukocytosis but nonspecific. He is on decadron and could explain this.   Final Clinical Impression(s) / ED Diagnoses Final diagnoses:  Encephalopathy  Metastatic malignant neoplasm,  unspecified site [facility])  Abrasion of face, initial encounter    Rx / DC Orders ED Discharge Orders    None       Virgel Manifold, MD 02/02/19 [phone]

## 2019-02-01 NOTE — ED Triage Notes (Signed)
Per pt, states he has been "falling out"-pin point pupils, unable to maintain eye contact-no able to answer questions-

## 2019-02-01 NOTE — ED Triage Notes (Signed)
Per pt, took 3 Vicodin instead of his prescribed 1-states he did it because his back pain

## 2019-02-01 NOTE — ED Notes (Signed)
Patient transported to CT 

## 2019-02-02 ENCOUNTER — Inpatient Hospital Stay (HOSPITAL_COMMUNITY): Payer: PRIVATE HEALTH INSURANCE

## 2019-02-02 DIAGNOSIS — G9341 Metabolic encephalopathy: Principal | ICD-10-CM

## 2019-02-02 LAB — URINALYSIS, ROUTINE W REFLEX MICROSCOPIC
Bacteria, UA: NONE SEEN
Bilirubin Urine: NEGATIVE
Glucose, UA: NEGATIVE mg/dL
Ketones, ur: NEGATIVE mg/dL
Nitrite: NEGATIVE
Protein, ur: NEGATIVE mg/dL
Specific Gravity, Urine: 1.008 (ref 1.005–1.030)
pH: 7 (ref 5.0–8.0)

## 2019-02-02 LAB — COMPREHENSIVE METABOLIC PANEL
ALT: 23 U/L (ref 0–44)
AST: 16 U/L (ref 15–41)
Albumin: 3.4 g/dL — ABNORMAL LOW (ref 3.5–5.0)
Alkaline Phosphatase: 111 U/L (ref 38–126)
Anion gap: 8 (ref 5–15)
BUN: 34 mg/dL — ABNORMAL HIGH (ref 6–20)
CO2: 22 mmol/L (ref 22–32)
Calcium: 10 mg/dL (ref 8.9–10.3)
Chloride: 111 mmol/L (ref 98–111)
Creatinine, Ser: 0.81 mg/dL (ref 0.61–1.24)
GFR calc Af Amer: >60 mL/min (ref >60)
GFR calc non Af Amer: >60 mL/min (ref >60)
Glucose, Bld: 83 mg/dL (ref 70–99)
Potassium: 3.7 mmol/L (ref 3.5–5.1)
Sodium: 141 mmol/L (ref 135–145)
Total Bilirubin: 0.4 mg/dL (ref 0.3–1.2)
Total Protein: 6.2 g/dL — ABNORMAL LOW (ref 6.5–8.1)

## 2019-02-02 LAB — CBC
HCT: 36.7 % — ABNORMAL LOW (ref 39.0–52.0)
Hemoglobin: 12.2 g/dL — ABNORMAL LOW (ref 13.0–17.0)
MCH: 33.7 pg (ref 26.0–34.0)
MCHC: 33.2 g/dL (ref 30.0–36.0)
MCV: 101.4 fL — ABNORMAL HIGH (ref 80.0–100.0)
Platelets: 233 10*3/uL (ref 150–400)
RBC: 3.62 MIL/uL — ABNORMAL LOW (ref 4.22–5.81)
RDW: 15.5 % (ref 11.5–15.5)
WBC: 13 10*3/uL — ABNORMAL HIGH (ref 4.0–10.5)
nRBC: 0 % (ref 0.0–0.2)

## 2019-02-02 LAB — SARS CORONAVIRUS 2 (TAT 6-24 HRS): SARS Coronavirus 2: NEGATIVE

## 2019-02-02 MED ORDER — ONDANSETRON HCL 4 MG PO TABS: 4.0000 mg | ORAL_TABLET | Freq: Four times a day (QID) | ORAL | Status: DC | PRN

## 2019-02-02 MED ORDER — DEXAMETHASONE 4 MG PO TABS
4.0000 mg | ORAL_TABLET | Freq: Two times a day (BID) | ORAL | Status: DC
  Administered 2019-02-02: 10:00:00 4 mg via ORAL
  Filled 2019-02-02: qty 1

## 2019-02-02 MED ORDER — DEXAMETHASONE SODIUM PHOSPHATE 4 MG/ML IJ SOLN
4.0000 mg | Freq: Three times a day (TID) | INTRAMUSCULAR | Status: DC
  Administered 2019-02-02: 4 mg via INTRAVENOUS
  Filled 2019-02-02: qty 1

## 2019-02-02 MED ORDER — LORAZEPAM 2 MG/ML IJ SOLN
1.0000 mg | INTRAMUSCULAR | Status: DC | PRN
  Administered 2019-02-02 (×2): 1 mg via INTRAVENOUS
  Filled 2019-02-02 (×2): qty 1

## 2019-02-02 MED ORDER — ENOXAPARIN SODIUM 40 MG/0.4ML ~~LOC~~ SOLN
40.0000 mg | Freq: Every day | SUBCUTANEOUS | Status: DC
  Administered 2019-02-02: 01:00:00 40 mg via SUBCUTANEOUS
  Filled 2019-02-02: qty 0.4

## 2019-02-02 MED ORDER — LEVETIRACETAM IN NACL 500 MG/100ML IV SOLN
500.0000 mg | Freq: Two times a day (BID) | INTRAVENOUS | Status: DC
  Administered 2019-02-02: 500 mg via INTRAVENOUS
  Filled 2019-02-02: qty 100

## 2019-02-02 MED ORDER — DRONABINOL 2.5 MG PO CAPS: 2.5000 mg | ORAL_CAPSULE | Freq: Two times a day (BID) | ORAL | Status: DC

## 2019-02-02 MED ORDER — SODIUM CHLORIDE 0.9 % IV SOLN
1.0000 g | INTRAVENOUS | Status: DC
  Administered 2019-02-02: 1 g via INTRAVENOUS
  Filled 2019-02-02: qty 1

## 2019-02-02 MED ORDER — SENNOSIDES-DOCUSATE SODIUM 8.6-50 MG PO TABS
2.0000 | ORAL_TABLET | Freq: Every day | ORAL | Status: DC
  Administered 2019-02-02: 10:00:00 2 via ORAL
  Filled 2019-02-02: qty 2

## 2019-02-02 MED ORDER — LORAZEPAM 2 MG/ML IJ SOLN
1.0000 mg | INTRAMUSCULAR | Status: DC | PRN
  Administered 2019-02-02 (×2): 1 mg via INTRAVENOUS
  Filled 2019-02-02 (×2): qty 1

## 2019-02-02 MED ORDER — DULOXETINE HCL 30 MG PO CPEP
30.0000 mg | ORAL_CAPSULE | Freq: Every day | ORAL | Status: DC
  Administered 2019-02-02: 30 mg via ORAL
  Filled 2019-02-02: qty 1

## 2019-02-02 MED ORDER — PANTOPRAZOLE SODIUM 40 MG PO TBEC
40.0000 mg | DELAYED_RELEASE_TABLET | Freq: Every day | ORAL | Status: DC
  Administered 2019-02-02: 40 mg via ORAL
  Filled 2019-02-02: qty 1

## 2019-02-02 MED ORDER — ONDANSETRON HCL 4 MG/2ML IJ SOLN: 4.0000 mg | Freq: Four times a day (QID) | INTRAMUSCULAR | Status: DC | PRN

## 2019-02-02 MED ORDER — DEXTROSE-NACL 5-0.9 % IV SOLN
INTRAVENOUS | Status: DC
  Administered 2019-02-02: 07:00:00 100 mL/h via INTRAVENOUS

## 2019-02-02 MED ORDER — HYDROMORPHONE HCL 1 MG/ML IJ SOLN
0.5000 mg | INTRAMUSCULAR | Status: DC | PRN
  Administered 2019-02-02 (×2): 0.5 mg via INTRAVENOUS
  Filled 2019-02-02 (×2): qty 0.5

## 2019-02-02 MED ORDER — ADULT MULTIVITAMIN W/MINERALS CH: 1.0000 | ORAL_TABLET | Freq: Every day | ORAL | Status: DC

## 2019-02-02 NOTE — Evaluation (Signed)
Clinical/Bedside Swallow Evaluation Patient Details  Name: Rodney [name] MRN: [id number] Date of Birth: 1970-11-23  Today's Date: 02/02/2019 Time: SLP Start Time (ACUTE ONLY): 1350 SLP Stop Time (ACUTE ONLY): 1421 SLP Time Calculation (min) (ACUTE ONLY): 31 min  Past Medical History:  Past Medical History:  Diagnosis Date  . Anxiety   . Bladder tumor   . Blood in urine    saw some 3 days ago but none inthe last 2 days   . Bradycardia   . Depression   . Dizziness    historical   . Elevated PSA   . Family history of gastric cancer   . Heartburn    occasional   . Pain with urination   . Prostate cancer ([place])   . 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 BACK SURGERY  2012   3 disc; dr Carloyn Manner  did first and dr elsner did the last 2   . CYSTOSCOPY W/ RETROGRADES N/A 07/02/2017   Procedure: CYSTOSCOPY WITH RETROGRADE PYELOGRAM/EXAM UNDER ANESTHESIA;  Surgeon: Raynelle Bring, MD;  [facility];  Service: Urology;  Laterality: N/A;  . CYSTOSCOPY WITH BIOPSY N/A 08/09/2017   Procedure: CYSTOSCOPY WITH PROSTATE NEEDLE BIOPSY;  Surgeon: Raynelle Bring, MD;  [facility];  Service: Urology;  Laterality: N/A;  GENERAL ANESTHESIA WITH PARALYSIS/ ONLY NEEDS 60 MIN FOR ALL PROCEDURES  . IR BONE TUMOR(S)RF ABLATION  11/29/2018  . IR BONE TUMOR(S)RF ABLATION  11/29/2018  . IR IMAGING GUIDED PORT INSERTION  08/07/2018  . IR KYPHO EA ADDL LEVEL THORACIC OR LUMBAR  11/29/2018  . IR KYPHO THORACIC WITH BONE BIOPSY  11/29/2018  . IR RADIOLOGIST EVAL & MGMT  11/14/2018  . LEG SURGERY  2012   4 metal plates in plates   . TRANSRECTAL ULTRASOUND N/A 08/09/2017   Procedure: TRANSRECTAL ULTRASOUND;  Surgeon: Raynelle Bring, MD;  [facility];  Service: Urology;  Laterality: N/A;  ONLY NEEDS 60 MIN FOR ALL PROCEDURES  . TRANSURETHRAL RESECTION OF BLADDER TUMOR N/A 07/02/2017   Procedure: TRANSURETHRAL RESECTION OF BLADDER TUMOR  (TURBT);  Surgeon: Raynelle Bring, MD;  [facility];  Service: Urology;  Laterality: N/A;  . TRANSURETHRAL RESECTION OF BLADDER TUMOR N/A 08/09/2017   Procedure: TRANSURETHRAL RESECTION OF BLADDER TUMOR (TURBT);  Surgeon: Raynelle Bring, MD;  [facility];  Service: Urology;  Laterality: N/A;   HPI:  Rodney [name], 48y/m, presented to ED with progressive confusion over last 2 days. PMH of metastatic prostate cancer, bladder cancer status post chemo radiation and surgery, depression, anxiety.  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 Discussion of Hospice were had with patient's daughter and girlfriend. Patient's girlfriend reported pt has begun to have more difficulty eating solids in the last several weeks.    Assessment / Plan / Recommendation Clinical Impression  Clinical Swallow Assessment completed at bedside with patient's girlfriend present. Patient's bilateral restraint's removed during assessment. Pt is able to hold cup, but appears to have uncontrolled movements in his hands. Will need assistance to eat. Pt tolerated thin liquids and purees with no s/s of aspiration and clear vocal quality. Cereal bar (soft solid) required prolonged mastication with prolonged A-P transit. A liquid wash was required to clear oral cavity. Recommend Dys 2, thin liquid diet for comfort. Patient and girlfriend educated about aspiration precautions and diet consistency recommendations. RN notified as  well of recommendation. No further ST services needed at this time as discharge plan is to Hospice.  SLP Visit Diagnosis: Dysphagia, oropharyngeal phase (R13.12)    Aspiration Risk  Moderate aspiration risk    Diet Recommendation Dysphagia 2 (Fine chop);Thin liquid   Liquid Administration via: Cup;Straw Medication Administration: Whole meds with puree Supervision: Staff to assist with self feeding Compensations:  Slow rate;Small sips/bites    Other  Recommendations Oral Care Recommendations: Oral care BID   Follow up Recommendations None        Swallow Study   General Date of Onset: 02/01/19 HPI: Rodney [name], 48y/m, presented to ED with progressive confusion over last 2 days. PMH of metastatic prostate cancer, bladder cancer status post chemo radiation and surgery, depression, anxiety.  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 Discussion of Hospice were had with patient's daughter and girlfriend. Patient's girlfriend reported pt has begun to have more difficulty eating solids in the last several weeks.  Type of Study: Bedside Swallow Evaluation Previous Swallow Assessment: none Diet Prior to this Study: Thin liquids Temperature Spikes Noted: No Respiratory Status: Room air History of Recent Intubation: No Behavior/Cognition: Alert;Cooperative;Impulsive Oral Cavity Assessment: Within Functional Limits Oral Care Completed by SLP: No Oral Cavity - Dentition: Adequate natural dentition Vision: Functional for self-feeding Self-Feeding Abilities: Needs assist Patient Positioning: Upright in bed Baseline Vocal Quality: Normal Volitional Cough: Cognitively unable to elicit Volitional Swallow: Unable to elicit    Oral/Motor/Sensory Function     Ice Chips Ice chips: Within functional limits Presentation: Spoon   Thin Liquid Thin Liquid: Within functional limits Presentation: Cup;Straw    Nectar Thick Nectar Thick Liquid: Not tested   Honey Thick Honey Thick Liquid: Not tested   Puree Puree: Within functional limits   Solid     Solid: Impaired Presentation: Spoon Oral Phase Impairments: Reduced lingual movement/coordination;Impaired mastication Oral Phase Functional Implications: Prolonged oral transit;Impaired mastication;Oral holding      [name]., MA CCC-SLP 02/02/2019,2:49 PM

## 2019-02-02 NOTE — H&P (Signed)
History and Physical   Rodney [name] [id number] DOB: January 05, 1971 DOA: 02/01/2019  Referring MD/NP/PA: Dr. Wilson Singer  PCP: Curlene Labrum, MD   Outpatient Specialists: None  Patient coming from: Home  Chief Complaint: Confusion  HPI: Rodney [name] is a 49 y.o. male with medical history significant of bladder cancer and prostate cancer with metastasis status post chemo radiation and surgery, history of depression with anxiety who was brought in by daughter secondary to progressive confusion for 2 days.  Patient has had previous UTIs.  He apparently was noted by the daughter to be confused multiple times.  She could not understand what he was doing.  He is talking out of 2 urine and not making sense.  He was with the daughter today when he tried to get out of the vehicle while she was driving.  She brought him to the ER where he unfortunately failed in the ER with some laceration to the left side of the face.  Patient remains confused.  He has been admitted with altered mental status.  Urinalysis is still not available although he has had previous UTIs which could be a cause.  No evidence of pneumonia no fever or chills..  ED Course: Temperature 97.7 blood pressure 147/104 pulse 116 respirate of 22 oxygen sat 92% on room air.  Sodium 138 the rest of the chemistry appeared to be within normal.  White count 15.4 hemoglobin 13.3 and platelets 306.  Relatively normal differentials.  19 screen negative from January 6.  Repeat currently pending.  Head CT and CT maxillofacial normal.  Patient being admitted to the hospital for further work-up urinalysis is currently pending  Review of Systems: As per HPI otherwise 10 point review of systems negative.    Past Medical History:  Diagnosis Date  . Anxiety   . Bladder tumor   . Blood in urine    saw some 3 days ago but none inthe last 2 days   . Bradycardia   . Depression   . Dizziness    historical   . Elevated PSA   . Family history of gastric  cancer   . Heartburn    occasional   . Pain with urination   . Prostate cancer ([place])   . 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)     Past Surgical History:  Procedure Laterality Date  . BACK SURGERY  2012   3 disc; dr Carloyn Manner  did first and dr elsner did the last 2   . CYSTOSCOPY W/ RETROGRADES N/A 07/02/2017   Procedure: CYSTOSCOPY WITH RETROGRADE PYELOGRAM/EXAM UNDER ANESTHESIA;  Surgeon: Raynelle Bring, MD;  Location: [facility];  Service: Urology;  Laterality: N/A;  . CYSTOSCOPY WITH BIOPSY N/A 08/09/2017   Procedure: CYSTOSCOPY WITH PROSTATE NEEDLE BIOPSY;  Surgeon: Raynelle Bring, MD;  Location: [facility];  Service: Urology;  Laterality: N/A;  GENERAL ANESTHESIA WITH PARALYSIS/ ONLY NEEDS 60 MIN FOR ALL PROCEDURES  . IR BONE TUMOR(S)RF ABLATION  11/29/2018  . IR BONE TUMOR(S)RF ABLATION  11/29/2018  . IR IMAGING GUIDED PORT INSERTION  08/07/2018  . IR KYPHO EA ADDL LEVEL THORACIC OR LUMBAR  11/29/2018  . IR KYPHO THORACIC WITH BONE BIOPSY  11/29/2018  . IR RADIOLOGIST EVAL & MGMT  11/14/2018  . LEG SURGERY  2012   4 metal plates in plates   . TRANSRECTAL ULTRASOUND N/A 08/09/2017   Procedure: TRANSRECTAL ULTRASOUND;  Surgeon: Raynelle Bring, MD;  Location: [facility];  Service: Urology;  Laterality: N/A;  ONLY NEEDS 60 MIN FOR  ALL PROCEDURES  . TRANSURETHRAL RESECTION OF BLADDER TUMOR N/A 07/02/2017   Procedure: TRANSURETHRAL RESECTION OF BLADDER TUMOR (TURBT);  Surgeon: Raynelle Bring, MD;  Location: [facility];  Service: Urology;  Laterality: N/A;  . TRANSURETHRAL RESECTION OF BLADDER TUMOR N/A 08/09/2017   Procedure: TRANSURETHRAL RESECTION OF BLADDER TUMOR (TURBT);  Surgeon: Raynelle Bring, MD;  Location: [facility];  Service: Urology;  Laterality: N/A;     reports that he quit smoking about 2 years ago. His smoking use included cigarettes. He has a 16.00 pack-year smoking history. He has quit using smokeless tobacco. He reports previous alcohol use. He reports previous drug use. Drug: Marijuana.  Allergies    Allergen Reactions  . Drug Class [Iodinated Diagnostic Agents] Nausea And Vomiting  . Hydrocodone Itching  . Morphine Itching  . Morphine And Related Itching  . Vicodin [Hydrocodone-Acetaminophen] Itching    Family History  Problem Relation Age of Onset  . Heart attack Father   . Hypertension Father   . Diabetes Father   . Heart failure Father   . Hypertension Mother   . Gastric cancer Sister 36       d. 36  . Prostate cancer Brother 65  . Melanoma Maternal Aunt   . Stroke Maternal Uncle   . Stroke Paternal Uncle   . Melanoma Maternal Grandmother        dx under 60  . Colon cancer Maternal Grandmother   . Heart disease Maternal Grandfather   . Heart attack Paternal Grandfather      Prior to Admission medications   Medication Sig Start Date End Date Taking? Authorizing Provider  dexamethasone (DECADRON) 4 MG tablet Take 1 tablet (4 mg total) by mouth 2 (two) times daily. 01/20/19  Yes [name], MD  dronabinol (MARINOL) 2.5 MG capsule Take 1 capsule (2.5 mg total) by mouth 2 (two) times daily before lunch and supper. 11/27/18  Yes [name], MD  DULoxetine (CYMBALTA) 30 MG capsule Take 1 capsule (30 mg total) by mouth daily. 10/16/18  Yes [name], MD  HYDROmorphone (DILAUDID) 2 MG tablet TAKE TWO TABLETS BY MOUTH EVERY 4 HOURS AS NEEDED FOR SEVERE PAIN 01/20/19  Yes [name], MD  Multiple Vitamin (MULTIVITAMIN WITH MINERALS) TABS tablet Take 1 tablet by mouth daily.   Yes [provider]  pantoprazole (PROTONIX) 40 MG tablet Take 1 tablet (40 mg total) by mouth daily. 12/26/18  Yes [name], MD  prochlorperazine (COMPAZINE) 10 MG tablet TAKE 1 TABLET BY MOUTH EVERY 6 HOURS AS NEEDED FOR NAUSEA AND VOMITING 01/28/19  Yes [name], MD  promethazine (PHENERGAN) 25 MG tablet TAKE 1 TABLET BY MOUTH EVERY 6 HOURS AS NEEDED FOR NAUSEA OR VOMITING 01/28/19  Yes Shadad, Mathis Dad, MD  senna-docusate (SENOKOT-S) 8.6-50 MG tablet Take 2 tablets by  mouth 2 (two) times daily. Patient taking differently: Take 2 tablets by mouth daily.  08/02/18  Yes Raiford Noble [place], [place]    Physical Exam: Vitals:   02/01/19 1725 02/01/19 2056 02/01/19 2116 02/01/19 2356  BP: (!) 123/102 119/86  116/81  Pulse: 94 89  84  Resp: (!) 22 18  15   Temp:      TempSrc:      SpO2: 98% 98%  97%  Weight:   68 kg   Height:   5\' 9"  (1.753 m)       Constitutional: Confused, restrained, agitated Vitals:   02/01/19 1725 02/01/19 2056 02/01/19 2116 02/01/19 2356  BP: (!) 123/102 119/86  116/81  Pulse: 94 89  84  Resp: (!) 22 18  15   Temp:      TempSrc:      SpO2: 98% 98%  97%  Weight:   68 kg   Height:   5\' 9"  (1.753 m)    Eyes: PERRL, lids and conjunctivae normal, left facial lacerations around the left eyelid ENMT: Mucous membranes are moist. Posterior pharynx clear of any exudate or lesions.Normal dentition.  Neck: normal, supple, no masses, no thyromegaly Respiratory: clear to auscultation bilaterally, no wheezing, no crackles. Normal respiratory effort. No accessory muscle use.  Cardiovascular: Regular rate and rhythm, no murmurs / rubs / gallops. No extremity edema. 2+ pedal pulses. No carotid bruits.  Abdomen: no tenderness, no masses palpated. No hepatosplenomegaly. Bowel sounds positive.  Musculoskeletal: no clubbing / cyanosis. No joint deformity upper and lower extremities. Good ROM, no contractures. Normal muscle tone.  Skin: no rashes, lesions, ulcers. No induration Neurologic: CN 2-12 grossly intact. Sensation intact, DTR normal. Strength 5/5 in all 4.  Psychiatric: Confused, disoriented.     Labs on Admission: I have personally reviewed following labs and imaging studies  CBC: Recent Labs  Lab 02/01/19 1914  WBC 15.4*  NEUTROABS 12.2*  HGB 13.3  HCT 38.8*  MCV 100.5*  PLT [id number]   Basic Metabolic Panel: Recent Labs  Lab 02/01/19 1914  NA 138  K 4.3  CL 109  CO2 22  GLUCOSE 116*  BUN 37*  CREATININE 0.89  CALCIUM 10.2    GFR: Estimated Creatinine Clearance: 95.5 mL/min (by C-G formula based on SCr of 0.89 mg/dL). Liver Function Tests: No results for input(s): AST, ALT, ALKPHOS, BILITOT, PROT, ALBUMIN in the last 168 hours. No results for input(s): LIPASE, AMYLASE in the last 168 hours. Recent Labs  Lab 02/01/19 1914  AMMONIA 26   Coagulation Profile: No results for input(s): INR, PROTIME in the last 168 hours. Cardiac Enzymes: No results for input(s): CKTOTAL, CKMB, CKMBINDEX, TROPONINI in the last 168 hours. BNP (last 3 results) No results for input(s): PROBNP in the last 8760 hours. HbA1C: No results for input(s): HGBA1C in the last 72 hours. CBG: Recent Labs  Lab 02/01/19 1518  GLUCAP 138*   Lipid Profile: No results for input(s): CHOL, HDL, LDLCALC, TRIG, CHOLHDL, LDLDIRECT in the last 72 hours. Thyroid Function Tests: No results for input(s): TSH, T4TOTAL, FREET4, T3FREE, THYROIDAB in the last 72 hours. Anemia Panel: No results for input(s): VITAMINB12, FOLATE, FERRITIN, TIBC, IRON, RETICCTPCT in the last 72 hours. Urine analysis:    Component Value Date/Time   COLORURINE YELLOW 11/11/2018 2057   APPEARANCEUR CLOUDY (A) 11/11/2018 2057   LABSPEC 1.009 11/11/2018 2057   PHURINE 7.0 11/11/2018 2057   GLUCOSEU NEGATIVE 11/11/2018 2057   HGBUR SMALL (A) 11/11/2018 2057   BILIRUBINUR NEGATIVE 11/11/2018 2057   [place] 11/11/2018 2057   PROTEINUR 100 (A) 11/11/2018 2057   NITRITE NEGATIVE 11/11/2018 2057   LEUKOCYTESUR LARGE (A) 11/11/2018 2057   Sepsis Labs: @LABRCNTIP (procalcitonin:4,lacticidven:4) )No results found for this or any previous visit (from the past 240 hour(s)).   Radiological Exams on Admission: CT Head Wo Contrast  Result Date: 02/01/2019 CLINICAL DATA:  49 year old male with history of facial trauma after a fall. EXAM: CT HEAD WITHOUT CONTRAST CT MAXILLOFACIAL WITHOUT CONTRAST TECHNIQUE: Multidetector CT imaging of the head and maxillofacial  structures were performed using the standard protocol without intravenous contrast. Multiplanar CT image reconstructions of the maxillofacial structures were also generated.  COMPARISON:  Head CT 09/07/2018. FINDINGS: CT HEAD FINDINGS Brain: Patchy and confluent areas of decreased attenuation are noted throughout the deep and periventricular white matter of the cerebral hemispheres bilaterally, increased compared to the prior study from 09/07/2018. No evidence of acute infarction, hemorrhage, hydrocephalus, extra-axial collection or mass lesion/mass effect. Vascular: No hyperdense vessel or unexpected calcification. Skull: Normal. Negative for fracture or focal lesion. Other: None. CT MAXILLOFACIAL FINDINGS Osseous: No fracture or mandibular dislocation. No destructive process. Orbits: Negative. No traumatic or inflammatory finding. Sinuses: Clear. Soft tissues: Small amount of gas in the soft tissues overlying the left periorbital region, presumably from a scalp laceration. IMPRESSION: 1. Small scalp laceration in the left periorbital region without underlying displaced facial bone or skull fracture. No evidence of significant acute intracranial trauma. 2. Progressively worsening diffuse white matter changes, increased compared to prior study from 09/07/2018. Given the patient's history of leptomeningeal metastatic disease, this may be related to evolving post radiation changes. Electronically Signed   By: Vinnie Langton M.D.   On: 02/01/2019 18:36   CT Maxillofacial Wo Contrast  Result Date: 02/01/2019 CLINICAL DATA:  49 year old male with history of facial trauma after a fall. EXAM: CT HEAD WITHOUT CONTRAST CT MAXILLOFACIAL WITHOUT CONTRAST TECHNIQUE: Multidetector CT imaging of the head and maxillofacial structures were performed using the standard protocol without intravenous contrast. Multiplanar CT image reconstructions of the maxillofacial structures were also generated. COMPARISON:  Head CT 09/07/2018.  FINDINGS: CT HEAD FINDINGS Brain: Patchy and confluent areas of decreased attenuation are noted throughout the deep and periventricular white matter of the cerebral hemispheres bilaterally, increased compared to the prior study from 09/07/2018. No evidence of acute infarction, hemorrhage, hydrocephalus, extra-axial collection or mass lesion/mass effect. Vascular: No hyperdense vessel or unexpected calcification. Skull: Normal. Negative for fracture or focal lesion. Other: None. CT MAXILLOFACIAL FINDINGS Osseous: No fracture or mandibular dislocation. No destructive process. Orbits: Negative. No traumatic or inflammatory finding. Sinuses: Clear. Soft tissues: Small amount of gas in the soft tissues overlying the left periorbital region, presumably from a scalp laceration. IMPRESSION: 1. Small scalp laceration in the left periorbital region without underlying displaced facial bone or skull fracture. No evidence of significant acute intracranial trauma. 2. Progressively worsening diffuse white matter changes, increased compared to prior study from 09/07/2018. Given the patient's history of leptomeningeal metastatic disease, this may be related to evolving post radiation changes. Electronically Signed   By: Vinnie Langton M.D.   On: 02/01/2019 18:36    Assessment/Plan Principal Problem:   Metabolic encephalopathy Active Problems:   Malignant neoplasm of prostate [facility])   Status post radical cystoprostatectomy   Bladder cancer ([place])     #1 metabolic encephalopathy: Patient has confusion.  He is currently restrained.  He has had the fall with some laceration.  No significant injury.  I suspect UTI or symptoms related to his known prostate cancer.  I will wait for the urinalysis.  Check ammonia level.  Hydrate patient and monitor him closely.  Control anxiety with as needed Ativan.  His CT of the head did show progressive worsening of diffuse white matter changes which is increased compared to prior scan but  suspected to be related to his previous leptomeningeal metastatic disease.  This may be the cause of his confusion if urinalysis and other screenings are negative.  Repeat COVID-19 screen also to rule out that as a cause.  #2 anxiety with depression: Patient currently restrained.  Medically given some Ativan and will follow closely.  #3 history  of metastatic prostate cancer: Status post radical prostatectomy.  Patient is DNR.  Defer any care to his urologist.   DVT prophylaxis: Lovenox Code Status: DNR Family Communication: Daughter over the phone Disposition Plan: Home Consults called: None Admission status: Inpatient  Severity of Illness: The appropriate patient status for this patient is INPATIENT. Inpatient status is judged to be reasonable and necessary in order to provide the required intensity of service to ensure the patient's safety. The patient's presenting symptoms, physical exam findings, and initial radiographic and laboratory data in the context of their chronic comorbidities is felt to place them at high risk for further clinical deterioration. Furthermore, it is not anticipated that the patient will be medically stable for discharge from the hospital within 2 midnights of admission. The following factors support the patient status of inpatient.   " The patient's presenting symptoms include confusion. " The worrisome physical exam findings include disoriented. " The initial radiographic and laboratory data are worrisome because of abnormal head CT . " The chronic co-morbidities include metastatic prostate cancer.   * I certify that at the point of admission it is my clinical judgment that the patient will require inpatient hospital care spanning beyond 2 midnights from the point of admission due to high intensity of service, high risk for further deterioration and high frequency of surveillance required.Barbette Merino MD [facility] Pager [phone]  If  7PM-7AM, please contact night-coverage [email or web address] Password [facility]  02/02/2019, 12:07 AM

## 2019-02-02 NOTE — ED Notes (Signed)
Patient dressed out into a hospital gown. Clothes and shoes on the back of stretcher.

## 2019-02-02 NOTE — ED Notes (Signed)
Requested urine from patient. 

## 2019-02-02 NOTE — Discharge Summary (Signed)
Physician Discharge Summary  Rodney [name] [id number] DOB: May 02, 1970 DOA: 02/01/2019  PCP: Curlene Labrum, MD  Admit date: 02/01/2019 Discharge date: 02/02/2019  Admitted From: Home Disposition:  Residential Hospice Discharge Condition:Guarded CODE STATUS: DNR Diet recommendation:Dysphagia 2  Brief/Interim Summary:  HPI: Rodney [name] is a 49 y.o. male with medical history significant of bladder cancer and prostate cancer with metastasis status post chemo radiation and surgery, history of depression with anxiety who was brought in by daughter secondary to progressive confusion for 2 days.  Patient has had previous UTIs.  He apparently was noted by the daughter to be confused multiple times.  She could not understand what he was doing.  He is talking out of 2 urine and not making sense.  He was with the daughter today when he tried to get out of the vehicle while she was driving.  She brought him to the ER where he unfortunately failed in the ER with some laceration to the left side of the face.  Patient remains confused.  He has been admitted with altered mental status.  Urinalysis is still not available although he has had previous UTIs which could be a cause.  No evidence of pneumonia no fever or chills..  ED Course: Temperature 97.7 blood pressure 147/104 pulse 116 respirate of 22 oxygen sat 92% on room air.  Sodium 138 the rest of the chemistry appeared to be within normal.  White count 15.4 hemoglobin 13.3 and platelets 306.  Relatively normal differentials.  19 screen negative from January 6.  Repeat currently pending.  Head CT and CT maxillofacial normal.  Patient being admitted to the hospital for further work-up urinalysis is currently pending.   Hospital Course:  Patient is a 49 year old male with history of metastatic prostate cancer, bladder cancer status post chemo radiation and surgery, depression, anxiety was brought by her daughter due to progressive confusion for last 2  days.  He also fell in the emergency department sustaining laceration on the left side of the face.  On presentation he was mildly hypertensive, sinus tachycardic, had mild leukocytosis.  Head/CT/maxillofacial CT did not show any acute intracranial abnormalities.  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 Patient was admitted for the management of altered mental status. UA was questionable for UTI.  Started on antibiotics and urine culture sent.  This morning he was still confused.  Remains hemodynamically stable, mildly agitated As per the report,he is being planned for palliative radiation therapy tomorrow. MRI done on 1/21 showed abnormal leptomeningeal enhancement along the surface of the cerebellum and around the brainstem consistent with leptomeningeal metastatic disease. As per the caregiver(girlfriend) and his sister, they know that he has been found to have metastasis in the brain as per last MRI but his confusion has worsened for last 1 week. We had ordered MRI of the brain to rule out worsening brain metastasis, EEG to rule out subclinical seizures but later on canceled because of no change in the management plan. He was also started on IVKeppra, IV Decadron I had long conversation  with the daughter and the girlfriend about the idea of residential hospice because of his poor prognosis.They were very understandable and agreed to this plan. Case manager consulted and he is being discharged today to residential hospice.  Discharge Diagnoses:  Principal Problem:   Metabolic encephalopathy Active Problems:   Malignant neoplasm of prostate [facility])   Status post radical cystoprostatectomy  Bladder cancer [facility])    Discharge Instructions  Discharge Instructions    Diet general   Complete by: As directed      Allergies as of 02/02/2019      Reactions   Drug Class [iodinated Diagnostic Agents] Nausea  And Vomiting   Hydrocodone Itching   Morphine Itching   Morphine And Related Itching   Vicodin [hydrocodone-acetaminophen] Itching      Medication List    STOP taking these medications   dexamethasone 4 MG tablet Commonly known as: DECADRON   dronabinol 2.5 MG capsule Commonly known as: MARINOL   DULoxetine 30 MG capsule Commonly known as: CYMBALTA   HYDROmorphone 2 MG tablet Commonly known as: DILAUDID   multivitamin with minerals Tabs tablet   pantoprazole 40 MG tablet Commonly known as: PROTONIX   prochlorperazine 10 MG tablet Commonly known as: COMPAZINE   promethazine 25 MG tablet Commonly known as: PHENERGAN   senna-docusate 8.6-50 MG tablet Commonly known as: Senokot-S       Allergies  Allergen Reactions  . Drug Class [Iodinated Diagnostic Agents] Nausea And Vomiting  . Hydrocodone Itching  . Morphine Itching  . Morphine And Related Itching  . Vicodin [Hydrocodone-Acetaminophen] Itching    Consultations:  None   Procedures/Studies: CT Head Wo Contrast  Result Date: 02/01/2019 CLINICAL DATA:  49 year old male with history of facial trauma after a fall. EXAM: CT HEAD WITHOUT CONTRAST CT MAXILLOFACIAL WITHOUT CONTRAST TECHNIQUE: Multidetector CT imaging of the head and maxillofacial structures were performed using the standard protocol without intravenous contrast. Multiplanar CT image reconstructions of the maxillofacial structures were also generated. COMPARISON:  Head CT 09/07/2018. FINDINGS: CT HEAD FINDINGS Brain: Patchy and confluent areas of decreased attenuation are noted throughout the deep and periventricular white matter of the cerebral hemispheres bilaterally, increased compared to the prior study from 09/07/2018. No evidence of acute infarction, hemorrhage, hydrocephalus, extra-axial collection or mass lesion/mass effect. Vascular: No hyperdense vessel or unexpected calcification. Skull: Normal. Negative for fracture or focal lesion. Other:  None. CT MAXILLOFACIAL FINDINGS Osseous: No fracture or mandibular dislocation. No destructive process. Orbits: Negative. No traumatic or inflammatory finding. Sinuses: Clear. Soft tissues: Small amount of gas in the soft tissues overlying the left periorbital region, presumably from a scalp laceration. IMPRESSION: 1. Small scalp laceration in the left periorbital region without underlying displaced facial bone or skull fracture. No evidence of significant acute intracranial trauma. 2. Progressively worsening diffuse white matter changes, increased compared to prior study from 09/07/2018. Given the patient's history of leptomeningeal metastatic disease, this may be related to evolving post radiation changes. Electronically Signed   By: Vinnie Langton M.D.   On: 02/01/2019 18:36   MR Brain W and Wo Contrast  Result Date: 01/17/2019 CLINICAL DATA:  Headache. Intracranial hemorrhage suspected. Stage for cancer of the bladder. EXAM: MRI HEAD WITHOUT AND WITH CONTRAST TECHNIQUE: Multiplanar, multiecho pulse sequences of the brain and surrounding structures were obtained without and with intravenous contrast. CONTRAST:  56mL GADAVIST GADOBUTROL 1 MMOL/ML IV SOLN COMPARISON:  Head CT 09/07/2018 FINDINGS: Brain: Diffusion imaging does not show any acute or subacute infarction or other cause of restricted diffusion. No abnormality is seen affecting the brainstem or cerebellum. Cerebral hemispheres show pronounced chronic small-vessel ischemic changes of the white matter considering age. No cortical or large vessel territory infarction. No hemorrhage, hydrocephalus or extra-axial collection. After contrast administration, there is leptomeningeal enhancement along the surface of the inferior cerebellum and around the brainstem consistent with leptomeningeal metastatic disease. I do  not see any intraparenchymal metastasis. Vascular: Major vessels at the base of the brain show flow. Skull and upper cervical spine: Probable  metastasis to the C3 vertebral body. 8 mm enhancing focus of the left frontal bone could be a metastasis in that location. Sinuses/Orbits: Clear/normal Other: None IMPRESSION: Abnormal leptomeningeal enhancement along the surface of the cerebellum and around the brainstem consistent with leptomeningeal metastatic disease. No intraparenchymal metastasis is seen. Bone metastasis suspected in the C3 vertebral body and in the left frontal calvarium. Electronically Signed   By: Nelson Chimes M.D.   On: 01/17/2019 21:18   MR TOTAL SPINE METS SCREENING  Result Date: 01/27/2019 CLINICAL DATA:  49 year old male with widely metastatic bladder cancer, additional history of prostate cancer. Leptomeningeal metastases on brain MRI earlier this month. Spine staging. EXAM: MRI TOTAL SPINE WITHOUT AND WITH CONTRAST TECHNIQUE: Multisequence MR imaging of the spine from the cervical spine to the sacrum was performed prior to and following IV contrast administration for evaluation of spinal metastatic disease. CONTRAST:  50mL GADAVIST GADOBUTROL 1 MMOL/ML IV SOLN COMPARISON:  Brain MRI 01/17/2019. CT Chest, Abdomen, and Pelvis 12/18/2018. Total spine MRI 07/20/2018. FINDINGS: MRI CERVICAL SPINE FINDINGS Alignment: Stable to improved cervical lordosis from July 2020. Vertebrae: Chronic C3 posterior vertebral body metastasis appears smaller from the prior total spine MRI on series 5, image 4 but remains enhancing. Background increased T1 marrow signal compatible with sequelae of radiation therapy in the upper cervical spine since July. No other cervical vertebral metastasis. No cervical epidural tumor identified now. Cord: There is cervicomedullary junction and occasional cervical spine leptomeningeal metastatic disease which is more conspicuous than on the 01/17/2019 brain MRI (series 12 image 4 today) involving the left cervicomedullary junction and lower brainstem. There is also evidence of a tiny leptomeningeal metastasis at the  dorsal C6 cord on image 3. There is faint abnormal STIR signal in the cervicomedullary junction on series 6, image 4. No other cervical cord edema. No abnormal cervical spinal cord signal despite degenerative appearing spinal stenosis with mild cord mass effect at C3-C4 (series 15, image 14). No pachymeningeal thickening in the cervical spine. Posterior Fossa, vertebral arteries, paraspinal tissues: Abnormal brainstem and posterior fossa leptomeningeal tumor as described recently on the brain MRI, and above. Disc levels: Cervical spine degeneration appears stable since July with mild degenerative spinal stenosis at C3-C4. MRI THORACIC SPINE FINDINGS Segmentation: Normal as described previously. Alignment: Increased midthoracic kyphosis due to pathologic compression fractures since July. No thoracic spondylolisthesis. Vertebrae: T1 through T3 vertebrae remain negative. Decreased size of the T4 posterior vertebral body metastasis since July although new mild pathologic compression of that level since then. The metastasis remains enhancing. T5 right posterior element metastasis similarly regressed on series 5, image 2. Pathologic compression fractures of T6 through T8 are new since July and T7 and T8 appear augmented as demonstrated recently by CT. Posterior element but no epidural tumor identified at those levels. Endplate compression fractures of T9, T11 and T12 are also new since July and may be pathologic, but with indeterminate enhancement only at T11 and T12. These appear stable from the CT last month. Background increased T1 bone marrow signal in keeping with prior radiation therapy. No epidural tumor identified. Cord: Abnormal leptomeningeal enhancement of the thoracic cord is most conspicuous from T10 to the conus (series 13, image 5). No thoracic spinal cord edema is evident. No pachymeningeal thickening identified. Paraspinal and other soft tissues: Visible abdominal viscera appear stable from the CT last  month.  Negative thoracic paraspinal soft tissues. Disc levels: No malignant or degenerative thoracic spinal stenosis, despite T6 through T8 compression fractures with mild retropulsion. No thoracic epidural tumor identified. MRI LUMBAR SPINE FINDINGS Segmentation:  Normal as described previously. Alignment: Straightening of lumbar lordosis has not significantly changed. Vertebrae: L1 pathologic compression fracture is new since July but stable from the CT last month. The underlying L1 vertebral metastasis appears regressed similar to the above lesions. Scattered smaller lumbar vertebral metastases elsewhere have also regressed since July. There are new but chronic appearing mild compression fractures of T2 through T4, stable from the CT last month. No lumbar epidural tumor. S1 sacral tumor has also regressed since July. Conus medullaris: Extends to the T12-L1 level and demonstrates abnormal leptomeningeal Hance mint as stated above. No cauda equina enhancement is identified. Paraspinal and other soft tissues: Stable from the CT last month. Disc levels: There is no malignant or degenerative lumbar spinal stenosis despite mild retropulsion of L1. IMPRESSION: 1. Generally regressed spinal vertebral metastases since the Total Spine MRI in July 2020, and no epidural tumor is identified now. However, multiple pathologic compression fractures have occurred in the thoracic and lumbar spine since July but are stable from the restaging CTs last month. 2. Spinal cord leptomeningeal metastases most pronounced at the cervicomedullary junction, the dorsal C6 cord, and the lower thoracic spinal cord T10 through the conus at T12-L1. Minimal edema at the cervicomedullary junction, but no bona fide spinal cord edema. No cauda equina metastatic disease identified. No pachymeningeal metastatic disease in the spine. 3. No malignant spinal stenosis, and isolated mild degenerative spinal cord mass effect at C3-C4 is stable since July.  Electronically Signed   By: Genevie Ann M.D.   On: 01/27/2019 21:20   CT Maxillofacial Wo Contrast  Result Date: 02/01/2019 CLINICAL DATA:  49 year old male with history of facial trauma after a fall. EXAM: CT HEAD WITHOUT CONTRAST CT MAXILLOFACIAL WITHOUT CONTRAST TECHNIQUE: Multidetector CT imaging of the head and maxillofacial structures were performed using the standard protocol without intravenous contrast. Multiplanar CT image reconstructions of the maxillofacial structures were also generated. COMPARISON:  Head CT 09/07/2018. FINDINGS: CT HEAD FINDINGS Brain: Patchy and confluent areas of decreased attenuation are noted throughout the deep and periventricular white matter of the cerebral hemispheres bilaterally, increased compared to the prior study from 09/07/2018. No evidence of acute infarction, hemorrhage, hydrocephalus, extra-axial collection or mass lesion/mass effect. Vascular: No hyperdense vessel or unexpected calcification. Skull: Normal. Negative for fracture or focal lesion. Other: None. CT MAXILLOFACIAL FINDINGS Osseous: No fracture or mandibular dislocation. No destructive process. Orbits: Negative. No traumatic or inflammatory finding. Sinuses: Clear. Soft tissues: Small amount of gas in the soft tissues overlying the left periorbital region, presumably from a scalp laceration. IMPRESSION: 1. Small scalp laceration in the left periorbital region without underlying displaced facial bone or skull fracture. No evidence of significant acute intracranial trauma. 2. Progressively worsening diffuse white matter changes, increased compared to prior study from 09/07/2018. Given the patient's history of leptomeningeal metastatic disease, this may be related to evolving post radiation changes. Electronically Signed   By: Vinnie Langton M.D.   On: 02/01/2019 18:36       Subjective: Patient seen and examined at the bedside this morning  Discharge Exam: Vitals:   02/02/19 0656 02/02/19 1410   BP: (!) 116/91 113/87  Pulse: (!) 103 94  Resp: 16 16  Temp: 98 F (36.7 C) 97.9 F (36.6 C)  SpO2: 93% 90%   Vitals:  02/02/19 0404 02/02/19 0441 02/02/19 0656 02/02/19 1410  BP: (!) 145/118 107/86 (!) 116/91 113/87  Pulse: 100 100 (!) 103 94  Resp: 14 14 16 16   Temp: 98.1 F (36.7 C)  98 F (36.7 C) 97.9 F (36.6 C)  TempSrc: Oral  Oral Oral  SpO2: 94% 97% 93% 90%  Weight:      Height:        General: Pt is not alert, awake, agitated,left facial bruise Cardiovascular: RRR, S1/S2 +, no rubs, no gallops Respiratory: CTA bilaterally, no wheezing, no rhonchi Abdominal: Soft, NT, ND, bowel sounds + Extremities: no edema, no cyanosis    The results of significant diagnostics from this hospitalization (including imaging, microbiology, ancillary and laboratory) are listed below for reference.     Microbiology: Recent Results (from the past 240 hour(s))  SARS CORONAVIRUS 2 (TAT 6-24 HRS) Nasopharyngeal Nasopharyngeal Swab     Status: None   Collection Time: 02/02/19  1:41 AM   Specimen: Nasopharyngeal Swab  Result Value Ref Range Status   SARS Coronavirus 2 NEGATIVE NEGATIVE Final    Comment: (NOTE) SARS-CoV-2 target nucleic acids are NOT DETECTED. The SARS-CoV-2 RNA is generally detectable in upper and lower respiratory specimens during the acute phase of infection. Negative results do not preclude SARS-CoV-2 infection, do not rule out co-infections with other pathogens, and should not be used as the sole basis for treatment or other patient management decisions. Negative results must be combined with clinical observations, patient history, and epidemiological information. The expected result is Negative. Fact Sheet for Patients: SugarRoll.be Fact Sheet for Healthcare Providers: https://www.woods-mathews.com/ This test is not yet approved or cleared by the [place] FDA and  has been authorized for detection and/or  diagnosis of SARS-CoV-2 by FDA under an Emergency Use Authorization (EUA). This EUA will remain  in effect (meaning this test can be used) for the duration of the COVID-19 declaration under Section 56 4(b)(1) of the Act, 21 U.S.C. section 360bbb-3(b)(1), unless the authorization is terminated or revoked sooner. Performed at [facility], [place] [address] [place], [place] [address]      Labs: BNP (last 3 results) Recent Labs    10/11/18 2208  BNP [id number]   Basic Metabolic Panel: Recent Labs  Lab 02/01/19 1914 02/02/19 0456  NA 138 141  K 4.3 3.7  CL 109 111  CO2 22 22  GLUCOSE 116* 83  BUN 37* 34*  CREATININE 0.89 0.81  CALCIUM 10.2 10.0   Liver Function Tests: Recent Labs  Lab 02/02/19 0456  AST 16  ALT 23  ALKPHOS 111  BILITOT 0.4  PROT 6.2*  ALBUMIN 3.4*   No results for input(s): LIPASE, AMYLASE in the last 168 hours. Recent Labs  Lab 02/01/19 1914  AMMONIA 26   CBC: Recent Labs  Lab 02/01/19 1914 02/02/19 0456  WBC 15.4* 13.0*  NEUTROABS 12.2*  --   HGB 13.3 12.2*  HCT 38.8* 36.7*  MCV 100.5* 101.4*  PLT 306 233   Cardiac Enzymes: No results for input(s): CKTOTAL, CKMB, CKMBINDEX, TROPONINI in the last 168 hours. BNP: Invalid input(s): POCBNP CBG: Recent Labs  Lab 02/01/19 1518  GLUCAP 138*   D-Dimer No results for input(s): DDIMER in the last 72 hours. Hgb A1c No results for input(s): HGBA1C in the last 72 hours. Lipid Profile No results for input(s): CHOL, HDL, LDLCALC, TRIG, CHOLHDL, LDLDIRECT in the last 72 hours. Thyroid function studies No results for input(s): TSH, T4TOTAL, T3FREE, THYROIDAB in the last 72  hours.  Invalid input(s): FREET3 Anemia work up No results for input(s): VITAMINB12, FOLATE, FERRITIN, TIBC, IRON, RETICCTPCT in the last 72 hours. Urinalysis    Component Value Date/Time   COLORURINE YELLOW 02/01/2019 1914   APPEARANCEUR CLEAR 02/01/2019 1914   LABSPEC 1.008 02/01/2019 1914   PHURINE 7.0  02/01/2019 1914   GLUCOSEU NEGATIVE 02/01/2019 1914   HGBUR SMALL (A) 02/01/2019 [place] NEGATIVE 02/01/2019 [place] NEGATIVE 02/01/2019 1914   PROTEINUR NEGATIVE 02/01/2019 1914   NITRITE NEGATIVE 02/01/2019 1914   LEUKOCYTESUR SMALL (A) 02/01/2019 1914   Sepsis Labs Invalid input(s): PROCALCITONIN,  WBC,  LACTICIDVEN Microbiology Recent Results (from the past 240 hour(s))  SARS CORONAVIRUS 2 (TAT 6-24 HRS) Nasopharyngeal Nasopharyngeal Swab     Status: None   Collection Time: 02/02/19  1:41 AM   Specimen: Nasopharyngeal Swab  Result Value Ref Range Status   SARS Coronavirus 2 NEGATIVE NEGATIVE Final    Comment: (NOTE) SARS-CoV-2 target nucleic acids are NOT DETECTED. The SARS-CoV-2 RNA is generally detectable in upper and lower respiratory specimens during the acute phase of infection. Negative results do not preclude SARS-CoV-2 infection, do not rule out co-infections with other pathogens, and should not be used as the sole basis for treatment or other patient management decisions. Negative results must be combined with clinical observations, patient history, and epidemiological information. The expected result is Negative. Fact Sheet for Patients: SugarRoll.be Fact Sheet for Healthcare Providers: https://www.woods-mathews.com/ This test is not yet approved or cleared by the [place] FDA and  has been authorized for detection and/or diagnosis of SARS-CoV-2 by FDA under an Emergency Use Authorization (EUA). This EUA will remain  in effect (meaning this test can be used) for the duration of the COVID-19 declaration under Section 56 4(b)(1) of the Act, 21 U.S.C. section 360bbb-3(b)(1), unless the authorization is terminated or revoked sooner. Performed at [facility], [place] [address] [place], [place] [address]     Please note: You were cared for by a hospitalist during your hospital stay. Once you are  discharged, your primary care physician will handle any further medical issues. Please note that NO REFILLS for any discharge medications will be authorized once you are discharged, as it is imperative that you return to your primary care physician (or establish a relationship with a primary care physician if you do not have one) for your post hospital discharge needs so that they can reassess your need for medications and monitor your lab values.    Time coordinating discharge: 40 minutes  SIGNED:   Shelly Coss, MD  [facility] 02/02/2019, 4:37 PM Pager [phone]  If 7PM-7AM, please contact night-coverage [email or web address] Password [email or web address]

## 2019-02-02 NOTE — ED Notes (Signed)
ED TO INPATIENT HANDOFF REPORT  ED Nurse Name and Phone #: [phone]  S Name/Age/Gender Rodney [name] 49 y.o. male Room/Bed: WHALD/WHALD  Code Status   Code Status: DNR  Home/SNF/Other Home Patient oriented to: self Is this baseline? No   Triage Complete: Triage complete  Chief Complaint Metabolic encephalopathy [id number]  Triage Note Per pt, states he has been "falling out"-pin point pupils, unable to maintain eye contact-no able to answer questions-  Per pt, took 3 Vicodin instead of his prescribed 1-states he did it because his back pain    Allergies Allergies  Allergen Reactions  . Drug Class [Iodinated Diagnostic Agents] Nausea And Vomiting  . Hydrocodone Itching  . Morphine Itching  . Morphine And Related Itching  . Vicodin [Hydrocodone-Acetaminophen] Itching    Level of Care/Admitting Diagnosis ED Disposition    ED Disposition Condition [facility]: [place] [[id number]]  Level of Care: Med-Surg [16]  Covid Evaluation: Asymptomatic Screening Protocol (No Symptoms)  Diagnosis: Metabolic encephalopathy [id number].ICD-9-CM]  Admitting Physician: Elwyn Reach [[id number]]  Attending Physician: Elwyn Reach [[id number]]  Estimated length of stay: past midnight tomorrow  Certification:: I certify this patient will need inpatient services for at least 2 midnights       B Medical/Surgery History Past Medical History:  Diagnosis Date  . Anxiety   . Bladder tumor   . Blood in urine    saw some 3 days ago but none inthe last 2 days   . Bradycardia   . Depression   . Dizziness    historical   . Elevated PSA   . Family history of gastric cancer   . Heartburn    occasional   . Pain with urination   . Prostate cancer ([place])   . 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 BACK SURGERY  2012   3 disc; dr Carloyn Manner  did first and dr elsner did the last 2   . CYSTOSCOPY W/ RETROGRADES N/A  07/02/2017   Procedure: CYSTOSCOPY WITH RETROGRADE PYELOGRAM/EXAM UNDER ANESTHESIA;  Surgeon: Raynelle Bring, MD;  Location: [facility];  Service: Urology;  Laterality: N/A;  . CYSTOSCOPY WITH BIOPSY N/A 08/09/2017   Procedure: CYSTOSCOPY WITH PROSTATE NEEDLE BIOPSY;  Surgeon: Raynelle Bring, MD;  Location: [facility];  Service: Urology;  Laterality: N/A;  GENERAL ANESTHESIA WITH PARALYSIS/ ONLY NEEDS 60 MIN FOR ALL PROCEDURES  . IR BONE TUMOR(S)RF ABLATION  11/29/2018  . IR BONE TUMOR(S)RF ABLATION  11/29/2018  . IR IMAGING GUIDED PORT INSERTION  08/07/2018  . IR KYPHO EA ADDL LEVEL THORACIC OR LUMBAR  11/29/2018  . IR KYPHO THORACIC WITH BONE BIOPSY  11/29/2018  . IR RADIOLOGIST EVAL & MGMT  11/14/2018  . LEG SURGERY  2012   4 metal plates in plates   . TRANSRECTAL ULTRASOUND N/A 08/09/2017   Procedure: TRANSRECTAL ULTRASOUND;  Surgeon: Raynelle Bring, MD;  Location: [facility];  Service: Urology;  Laterality: N/A;  ONLY NEEDS 60 MIN FOR ALL PROCEDURES  . TRANSURETHRAL RESECTION OF BLADDER TUMOR N/A 07/02/2017   Procedure: TRANSURETHRAL RESECTION OF BLADDER TUMOR (TURBT);  Surgeon: Raynelle Bring, MD;  Location: [facility];  Service: Urology;  Laterality: N/A;  . TRANSURETHRAL RESECTION OF BLADDER TUMOR N/A 08/09/2017   Procedure: TRANSURETHRAL RESECTION OF BLADDER TUMOR (TURBT);  Surgeon: Raynelle Bring, MD;  Location: [facility];  Service: Urology;  Laterality: N/A;     A IV Location/Drains/Wounds Patient Lines/Drains/Airways Status  Active Line/Drains/Airways    Name:   Placement date:   Placement time:   Site:   Days:   Implanted Port 08/07/18 Right   08/07/18    0805    --   179   Peripheral IV 02/01/19 Left Forearm   02/01/19    2135    Forearm   1   Incision (Closed) 07/22/18 Hip Left;Posterior   07/22/18    1245     195   Incision (Closed) 11/29/18 Back Right;Left;Upper   11/29/18    1233     65          Intake/Output Last 24 hours  Intake/Output Summary (Last 24 hours) at 02/02/2019 0738 Last  data filed at 02/02/2019 0026 Gross per 24 hour  Intake 1000 ml  Output --  Net 1000 ml    Labs/Imaging Results for orders placed or performed during the hospital encounter of 02/01/19 (from the past 48 hour(s))  CBG monitoring, ED     Status: Abnormal   Collection Time: 02/01/19  3:18 PM  Result Value Ref Range   Glucose-Capillary 138 (H) 70 - 99 mg/dL  CBC with Differential     Status: Abnormal   Collection Time: 02/01/19  7:14 PM  Result Value Ref Range   WBC 15.4 (H) 4.0 - 10.5 K/uL   RBC 3.86 (L) 4.22 - 5.81 MIL/uL   Hemoglobin 13.3 13.0 - 17.0 g/dL   HCT 38.8 (L) 39.0 - 52.0 %   MCV 100.5 (H) 80.0 - 100.0 fL   MCH 34.5 (H) 26.0 - 34.0 pg   MCHC 34.3 30.0 - 36.0 g/dL   RDW 15.6 (H) 11.5 - 15.5 %   Platelets 306 150 - 400 K/uL   nRBC 0.0 0.0 - 0.2 %   Neutrophils Relative % 80 %   Neutro Abs 12.2 (H) 1.7 - 7.7 K/uL   Lymphocytes Relative 12 %   Lymphs Abs 1.9 0.7 - 4.0 K/uL   Monocytes Relative 6 %   Monocytes Absolute 1.0 0.1 - 1.0 K/uL   Eosinophils Relative 0 %   Eosinophils Absolute 0.0 0.0 - 0.5 K/uL   Basophils Relative 0 %   Basophils Absolute 0.1 0.0 - 0.1 K/uL   Immature Granulocytes 2 %   Abs Immature Granulocytes 0.27 (H) 0.00 - 0.07 K/uL    Comment: Performed at [facility], [place] [address], [place], [place] [id number]  Basic metabolic panel     Status: Abnormal   Collection Time: 02/01/19  7:14 PM  Result Value Ref Range   Sodium 138 135 - 145 mmol/L   Potassium 4.3 3.5 - 5.1 mmol/L   Chloride 109 98 - 111 mmol/L   CO2 22 22 - 32 mmol/L   Glucose, Bld 116 (H) 70 - 99 mg/dL   BUN 37 (H) 6 - 20 mg/dL   Creatinine, Ser 0.89 0.61 - 1.24 mg/dL   Calcium 10.2 8.9 - 10.3 mg/dL   GFR calc non Af Amer >60 >60 mL/min   GFR calc Af Amer >60 >60 mL/min   Anion gap 7 5 - 15    Comment: Performed at [facility], [place] [address] [place], [place] [address]  Ammonia     Status: None   Collection Time: 02/01/19  7:14 PM   Result Value Ref Range   Ammonia 26 9 - 35 umol/L    Comment: Performed at [facility], [place] [address] [place], [place] [address]  Urinalysis,  Routine w reflex microscopic     Status: Abnormal   Collection Time: 02/01/19  7:14 PM  Result Value Ref Range   Color, Urine YELLOW YELLOW   APPearance CLEAR CLEAR   Specific Gravity, Urine 1.008 1.005 - 1.030   pH 7.0 5.0 - 8.0   Glucose, UA NEGATIVE NEGATIVE mg/dL   Hgb urine dipstick SMALL (A) NEGATIVE   Bilirubin Urine NEGATIVE NEGATIVE   Ketones, ur NEGATIVE NEGATIVE mg/dL   Protein, ur NEGATIVE NEGATIVE mg/dL   Nitrite NEGATIVE NEGATIVE   Leukocytes,Ua SMALL (A) NEGATIVE   RBC / HPF 0-5 0 - 5 RBC/hpf   WBC, UA 11-20 0 - 5 WBC/hpf   Bacteria, UA NONE SEEN NONE SEEN   Non Squamous Epithelial 0-5 (A) NONE SEEN    Comment: Performed at [facility], [place] [address] [place], [place] [address]  Comprehensive metabolic panel     Status: Abnormal   Collection Time: 02/02/19  4:56 AM  Result Value Ref Range   Sodium 141 135 - 145 mmol/L   Potassium 3.7 3.5 - 5.1 mmol/L   Chloride 111 98 - 111 mmol/L   CO2 22 22 - 32 mmol/L   Glucose, Bld 83 70 - 99 mg/dL   BUN 34 (H) 6 - 20 mg/dL   Creatinine, Ser 0.81 0.61 - 1.24 mg/dL   Calcium 10.0 8.9 - 10.3 mg/dL   Total Protein 6.2 (L) 6.5 - 8.1 g/dL   Albumin 3.4 (L) 3.5 - 5.0 g/dL   AST 16 15 - 41 U/L   ALT 23 0 - 44 U/L   Alkaline Phosphatase 111 38 - 126 U/L   Total Bilirubin 0.4 0.3 - 1.2 mg/dL   GFR calc non Af Amer >60 >60 mL/min   GFR calc Af Amer >60 >60 mL/min   Anion gap 8 5 - 15    Comment: Performed at [facility], [place] [address], [place], [place] [address]  CBC     Status: Abnormal   Collection Time: 02/02/19  4:56 AM  Result Value Ref Range   WBC 13.0 (H) 4.0 - 10.5 K/uL   RBC 3.62 (L) 4.22 - 5.81 MIL/uL   Hemoglobin 12.2 (L) 13.0 - 17.0 g/dL   HCT 36.7 (L) 39.0 - 52.0 %   MCV 101.4 (H) 80.0 - 100.0 fL   MCH 33.7  26.0 - 34.0 pg   MCHC 33.2 30.0 - 36.0 g/dL   RDW 15.5 11.5 - 15.5 %   Platelets 233 150 - 400 K/uL   nRBC 0.0 0.0 - 0.2 %    Comment: Performed at [facility], [place] [address], [place], [place] [address]   CT Head Wo Contrast  Result Date: 02/01/2019 CLINICAL DATA:  49 year old male with history of facial trauma after a fall. EXAM: CT HEAD WITHOUT CONTRAST CT MAXILLOFACIAL WITHOUT CONTRAST TECHNIQUE: Multidetector CT imaging of the head and maxillofacial structures were performed using the standard protocol without intravenous contrast. Multiplanar CT image reconstructions of the maxillofacial structures were also generated. COMPARISON:  Head CT 09/07/2018. FINDINGS: CT HEAD FINDINGS Brain: Patchy and confluent areas of decreased attenuation are noted throughout the deep and periventricular white matter of the cerebral hemispheres bilaterally, increased compared to the prior study from 09/07/2018. No evidence of acute infarction, hemorrhage, hydrocephalus, extra-axial collection or mass lesion/mass effect. Vascular: No hyperdense vessel or unexpected calcification. Skull: Normal. Negative for fracture or focal lesion. Other: None. CT MAXILLOFACIAL FINDINGS Osseous: No fracture or mandibular dislocation.  No destructive process. Orbits: Negative. No traumatic or inflammatory finding. Sinuses: Clear. Soft tissues: Small amount of gas in the soft tissues overlying the left periorbital region, presumably from a scalp laceration. IMPRESSION: 1. Small scalp laceration in the left periorbital region without underlying displaced facial bone or skull fracture. No evidence of significant acute intracranial trauma. 2. Progressively worsening diffuse white matter changes, increased compared to prior study from 09/07/2018. Given the patient's history of leptomeningeal metastatic disease, this may be related to evolving post radiation changes. Electronically Signed   By: Vinnie Langton M.D.   On:  02/01/2019 18:36   CT Maxillofacial Wo Contrast  Result Date: 02/01/2019 CLINICAL DATA:  49 year old male with history of facial trauma after a fall. EXAM: CT HEAD WITHOUT CONTRAST CT MAXILLOFACIAL WITHOUT CONTRAST TECHNIQUE: Multidetector CT imaging of the head and maxillofacial structures were performed using the standard protocol without intravenous contrast. Multiplanar CT image reconstructions of the maxillofacial structures were also generated. COMPARISON:  Head CT 09/07/2018. FINDINGS: CT HEAD FINDINGS Brain: Patchy and confluent areas of decreased attenuation are noted throughout the deep and periventricular white matter of the cerebral hemispheres bilaterally, increased compared to the prior study from 09/07/2018. No evidence of acute infarction, hemorrhage, hydrocephalus, extra-axial collection or mass lesion/mass effect. Vascular: No hyperdense vessel or unexpected calcification. Skull: Normal. Negative for fracture or focal lesion. Other: None. CT MAXILLOFACIAL FINDINGS Osseous: No fracture or mandibular dislocation. No destructive process. Orbits: Negative. No traumatic or inflammatory finding. Sinuses: Clear. Soft tissues: Small amount of gas in the soft tissues overlying the left periorbital region, presumably from a scalp laceration. IMPRESSION: 1. Small scalp laceration in the left periorbital region without underlying displaced facial bone or skull fracture. No evidence of significant acute intracranial trauma. 2. Progressively worsening diffuse white matter changes, increased compared to prior study from 09/07/2018. Given the patient's history of leptomeningeal metastatic disease, this may be related to evolving post radiation changes. Electronically Signed   By: Vinnie Langton M.D.   On: 02/01/2019 18:36    Pending Labs Unresulted Labs (From admission, onward)    Start     Ordered   02/09/19 0500  Creatinine, serum  (enoxaparin (LOVENOX)    CrCl >/= 30 ml/min)  Weekly,   R     Comments: while on enoxaparin therapy    02/02/19 0013   02/01/19 2049  SARS CORONAVIRUS 2 (TAT 6-24 HRS) Nasopharyngeal Nasopharyngeal Swab  (Tier 3 (TAT 6-24 hrs))  Once,   STAT    Question Answer Comment  Is this test for diagnosis or screening Screening   Symptomatic for COVID-19 as defined by CDC No   Hospitalized for COVID-19 No   Admitted to ICU for COVID-19 No   Previously tested for COVID-19 No   Resident in a congregate (group) care setting No   Employed in healthcare setting No      02/01/19 2048          Vitals/Pain Today's Vitals   02/02/19 0252 02/02/19 0404 02/02/19 0441 02/02/19 0656  BP: (!) 115/92 (!) 145/118 107/86 (!) 116/91  Pulse: 93 100 100 (!) 103  Resp: 14 14 14 16   Temp: 97.9 F (36.6 C) 98.1 F (36.7 C)  98 F (36.7 C)  TempSrc: Oral Oral  Oral  SpO2: 95% 94% 97% 93%  Weight:      Height:      PainSc: 0-No pain 0-No pain 0-No pain 0-No pain    Isolation Precautions No active isolations  Medications Medications  multivitamin with minerals tablet 1 tablet (has no administration in time range)  senna-docusate (Senokot-S) tablet 2 tablet (has no administration in time range)  DULoxetine (CYMBALTA) DR capsule 30 mg (has no administration in time range)  dronabinol (MARINOL) capsule 2.5 mg (has no administration in time range)  pantoprazole (PROTONIX) EC tablet 40 mg (has no administration in time range)  enoxaparin (LOVENOX) injection 40 mg (40 mg Subcutaneous Given 02/02/19 0046)  dextrose 5 %-0.9 % sodium chloride infusion (100 mL/hr Intravenous New Bag/Given 02/02/19 0647)  ondansetron (ZOFRAN) tablet 4 mg (has no administration in time range)    Or  ondansetron (ZOFRAN) injection 4 mg (has no administration in time range)  LORazepam (ATIVAN) injection 1 mg (1 mg Intravenous Given 02/02/19 0046)  bacitracin ointment ( Topical Given 02/01/19 1831)  lactated ringers bolus 1,000 mL (0 mLs Intravenous Stopped 02/02/19 0026)    Mobility walks  with person assist High fall risk   Focused Assessments .   R Recommendations: See Admitting Provider Note  Report given to:   Additional Notes: n/a

## 2019-02-02 NOTE — Progress Notes (Signed)
Patient is a 49 year old male with history of metastatic prostate cancer, bladder cancer status post chemo radiation and surgery, depression, anxiety was brought by her daughter due to progressive confusion for last 2 days.  He also fell in the emergency department sustaining laceration on the left side of the face.  On presentation he was mildly hypertensive, sinus tachycardic, had mild leukocytosis.  Head/CT/maxillofacial CT did not show any acute intracranial abnormalities.  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 Patient was admitted for the management of altered mental status. UA was questionable for UTI.  Started on antibiotics and urine culture sent.  This morning he was still confused.  Remains hemodynamically stable, mildly agitated As per the report,he is being planned for palliative radiation therapy tomorrow. MRI done on 1/21 showed abnormal leptomeningeal enhancement along the surface of the cerebellum and around the brainstem consistent with leptomeningeal metastatic disease. As per the caregiver(girlfriend) and his sister, they know that he has been found to have metastasis in the brain as per last MRI but his confusion has worsened for last 1 week.  I have ordered MRI of the brain to rule out worsening brain metastasis, EEG to rule out subclinical seizures.  Started on Keppra, IV Decadron Continue sedatives for agitation.  Ammonia level normal. I have discussed with the daughter about the idea of residential hospice.  She is very interested on hospice at home.  If his mental status does not improve then she is agreeable to consider residential hospice.  I have requested case manager consultation. I will get the report of the MRI and get back to the daughter tomorrow.  No charge

## 2019-02-02 NOTE — ED Notes (Signed)
Please call Nira Conn (daughter) at [phone]

## 2019-02-03 ENCOUNTER — Ambulatory Visit
Admission: RE | Admit: 2019-02-03 | Payer: PRIVATE HEALTH INSURANCE | Source: Ambulatory Visit | Admitting: Radiation Oncology

## 2019-02-03 LAB — URINE CULTURE: Culture: >=100000 — AB

## 2019-02-03 LAB — SARS CORONAVIRUS 2 (TAT 6-24 HRS): SARS Coronavirus 2: NEGATIVE

## 2019-02-04 ENCOUNTER — Ambulatory Visit: Payer: PRIVATE HEALTH INSURANCE

## 2019-02-05 ENCOUNTER — Ambulatory Visit: Payer: PRIVATE HEALTH INSURANCE

## 2019-02-06 ENCOUNTER — Ambulatory Visit: Payer: PRIVATE HEALTH INSURANCE

## 2019-02-07 ENCOUNTER — Ambulatory Visit: Payer: PRIVATE HEALTH INSURANCE

## 2019-02-10 ENCOUNTER — Ambulatory Visit: Payer: PRIVATE HEALTH INSURANCE

## 2019-02-11 ENCOUNTER — Ambulatory Visit: Payer: PRIVATE HEALTH INSURANCE

## 2019-02-12 ENCOUNTER — Ambulatory Visit: Payer: PRIVATE HEALTH INSURANCE

## 2019-02-13 ENCOUNTER — Other Ambulatory Visit: Payer: PRIVATE HEALTH INSURANCE

## 2019-02-13 ENCOUNTER — Ambulatory Visit: Payer: PRIVATE HEALTH INSURANCE

## 2019-02-13 ENCOUNTER — Ambulatory Visit: Payer: PRIVATE HEALTH INSURANCE | Admitting: Oncology

## 2019-02-14 ENCOUNTER — Ambulatory Visit: Payer: PRIVATE HEALTH INSURANCE

## 2019-02-17 ENCOUNTER — Other Ambulatory Visit (HOSPITAL_COMMUNITY): Payer: PRIVATE HEALTH INSURANCE

## 2019-02-17 DEATH — deceased

## 2019-03-21 NOTE — Progress Notes (Signed)
  Radiation Oncology         [phone] ________________________________  Name: Rodney [name] MRN: ID:[id number]  Date: 01/28/2019  DOB: 05/07/1970    Simulation and treatment planning note  DIAGNOSIS:     ICD-10-CM   1. 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 The patient was placed in a supine position and a customized thermoplastic head cast was constructed to aid in patient immobilization during the treatment. This complex treatment device will be used on a daily basis. In this fashion a CT scan was obtained through the head and neck region and isocenter was placed near midline within the brain.  The patient will be planned to receive a course of whole brain radiation treatment to a dose of 30 gray in 10 fractions at 3 gray per fraction. To accomplish this, 2 customized blocks have been designed which corresponds to left and right whole brain radiation fields. These 2 complex treatment devices will be used on a daily basis during the course of radiation. A complex isodose plan is requested to insure that the target area is adequately covered in to facilitate optimization of the treatment plan. A forward planning technique will also be evaluated to determine if this approach significantly improves the plan.   ________________________________   Jodelle Gross, MD, PhD

## 2021-04-23 IMAGING — CT CT ANGIOGRAPHY CHEST
2 of 6 series · 14 of 46 positions shown · IV contrast (OMNIPAQUE)
Comparison: No prior chest CT.  PET-CT 09/25/2017 is correlated.
COMPARISON: No prior chest CT.  PET-CT 09/25/2017 is correlated.

Addendum:
CLINICAL DATA: 48-year-old with personal history of laparoscopic
cystoprostatectomy for prostate cancer and bladder cancer, with
known widespread osseous metastatic disease for which the patient
has severe pain. He has been essentially bedridden for approximately
2 weeks and now has acute onset of shortness of breath.

EXAM:
CT ANGIOGRAPHY CHEST WITH CONTRAST
TECHNIQUE: Multidetector CT imaging of the chest was performed using the
standard protocol during bolus administration of intravenous
contrast. Multiplanar CT image reconstructions and MIPs were
obtained to evaluate the vascular anatomy.
CONTRAST:  100mL OMNIPAQUE IOHEXOL 350 MG/ML IV.

[Series 5: thins · axial · 0.76mm/px · z∈[-268,-10]mm · 12 of 283 slices shown]
[im 13/283  lung]
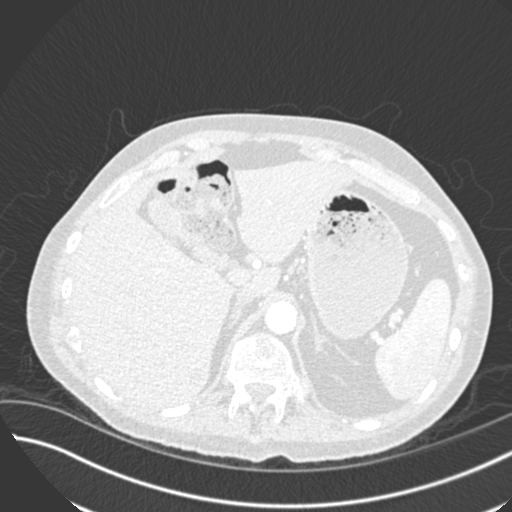
[im 37/283  soft-tissue]
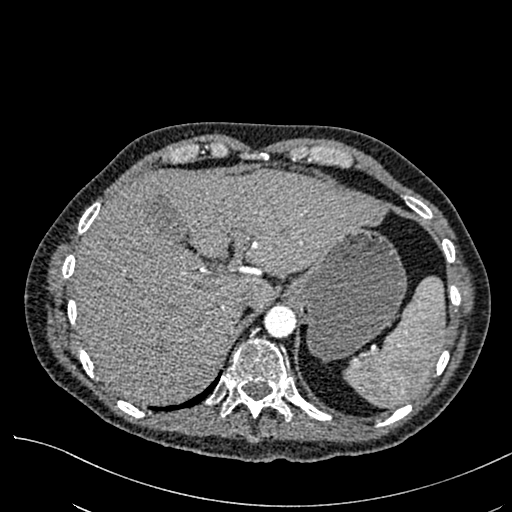
[im 62/283  lung]
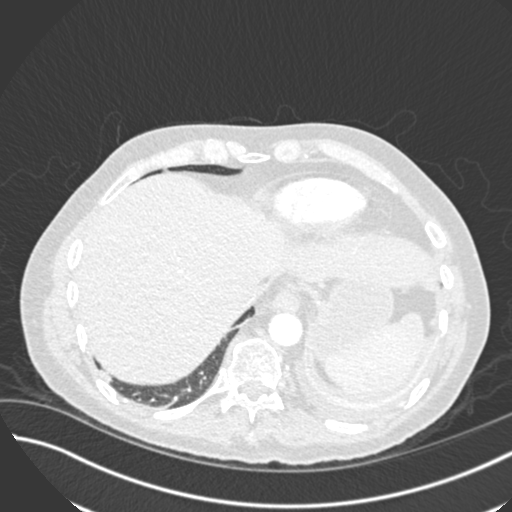
[im 86/283  soft-tissue]
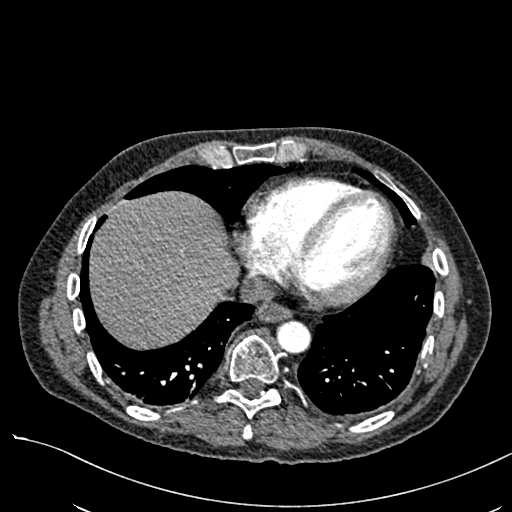
[im 111/283  lung]
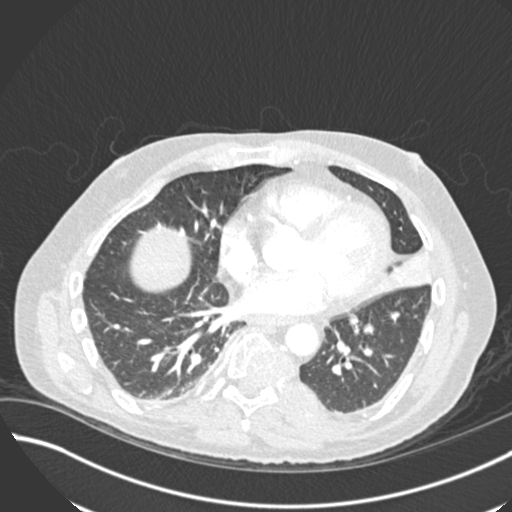
[im 135/283  soft-tissue]
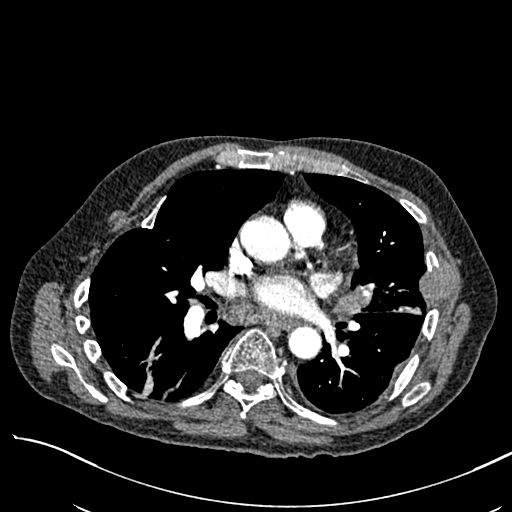
[im 148/283  lung]
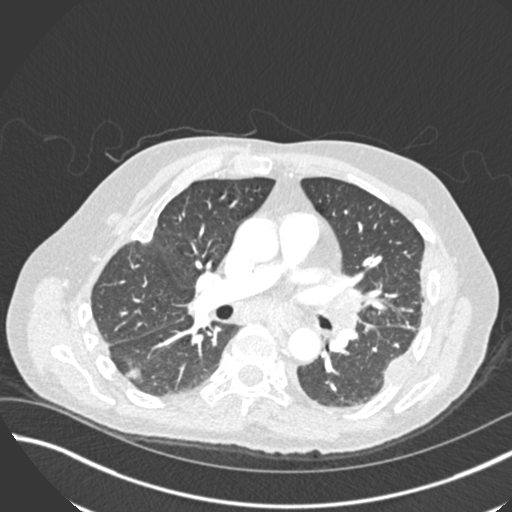
[im 172/283  soft-tissue]
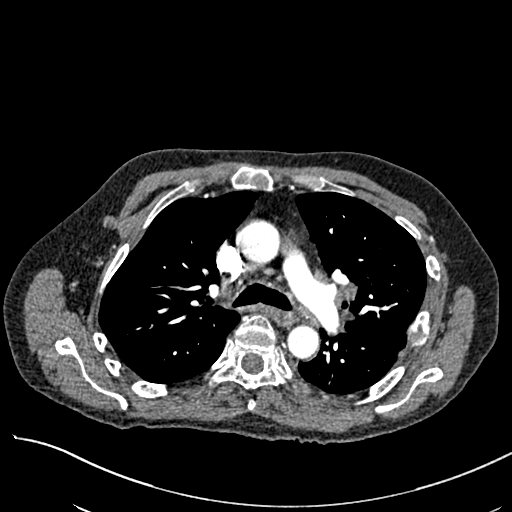
[im 197/283  lung]
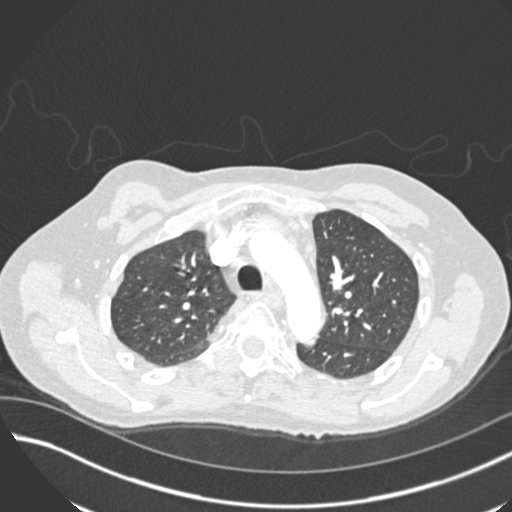
[im 221/283  soft-tissue]
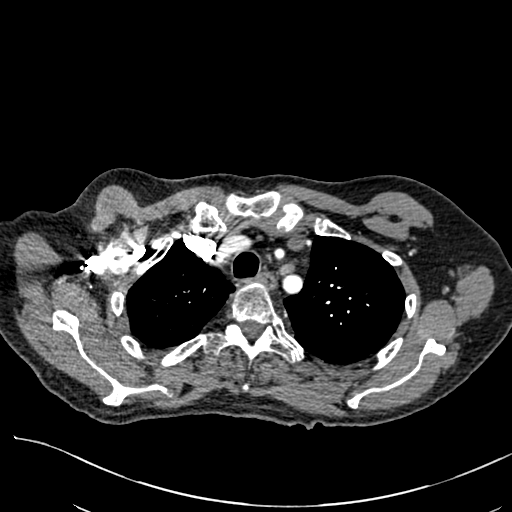
[im 246/283  lung]
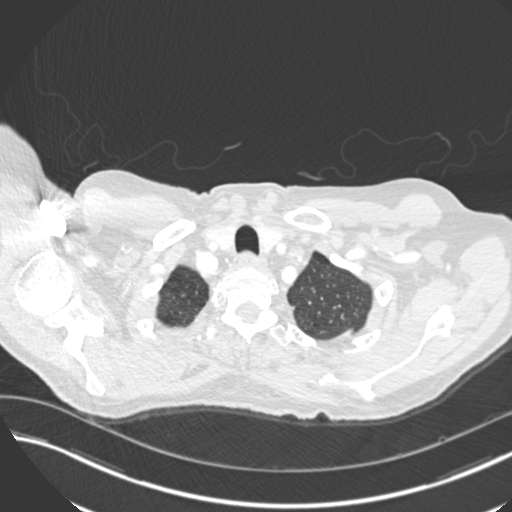
[im 270/283  soft-tissue]
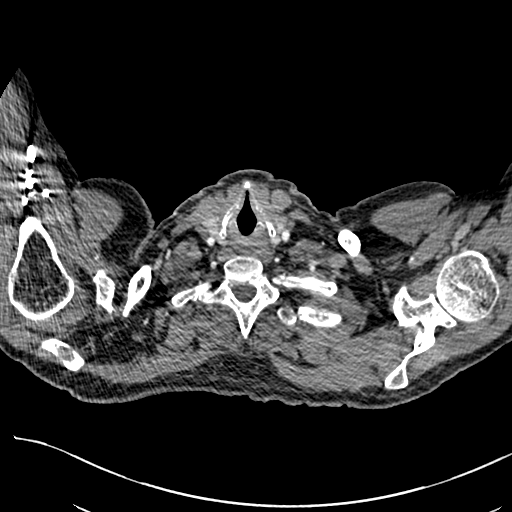

[Series 7: coronal mpr · coronal · 0.60mm/px · 2 of 80 slices shown]
[im 27/80  soft-tissue]
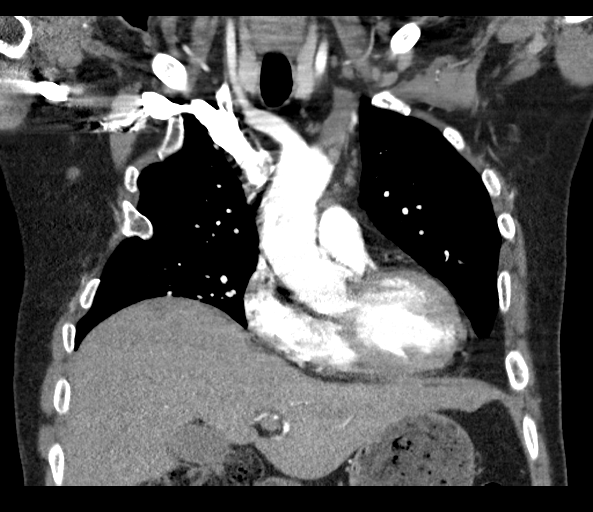
[im 53/80  soft-tissue]
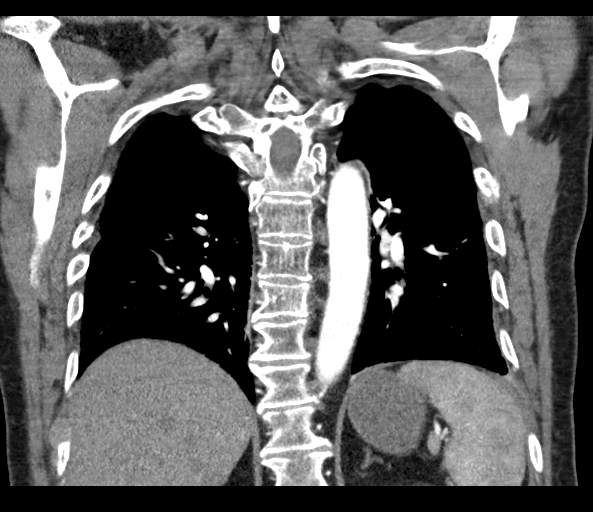

[14 of 46 positions shown; findings below may reference images not displayed]

FINDINGS: Cardiovascular: Contrast opacification of the pulmonary arteries is
good. No filling defects within either main pulmonary artery or
their segmental branches in either lung to suggest pulmonary
embolism. Heart size upper normal to mildly enlarged. No visible
coronary atherosclerosis. No pericardial effusion. No visible
atherosclerosis involving the thoracic or proximal abdominal aorta
or their visible branches.

Mediastinum/Nodes: Pathologic lymphadenopathy involving the
mediastinum and the LEFT hilum. Index subcarinal (station 7) nodal
mass measures approximately 1.7 x 3.8 cm. Index LEFT hilar nodal
mass measures approximately 2.8 x 2.8 cm and this mass obstructs the
lingular segment bronchus. Fluid is present in the mid and distal
esophagus without evidence of hiatal hernia. Esophagus otherwise
normal in appearance. Normal-appearing thyroid gland.

Lungs/Pleura: Obstructive atelectasis involving the lingula, related
to bronchial obstruction by the LEFT hilar mass. Linear atelectasis
involving the RIGHT LOWER LOBE. Small LEFT pleural effusion and
associated mild passive atelectasis involving the LEFT LOWER LOBE.
No pulmonary parenchymal nodules or masses. No confluent or
ground-glass airspace consolidation.

Upper Abdomen: Multiple low-attenuation masses throughout the
visualized liver, the largest measuring approximately 3 cm in the
POSTERIOR segment RIGHT lobe. Visualized upper abdomen otherwise
unremarkable for the early arterial phase of enhancement.

Musculoskeletal: Widespread osseous metastatic disease as noted on
prior MRI examination. The most critical osseous metastasis involves
the LEFT LATERAL T8 vertebral body and pedicle, with marked epidural
tumor extension at this level with associated cord compression.
There is an adjacent large metastasis involving the LEFT POSTERIOR
ninth rib which is nearly contiguous with the vertebral body
metastasis.

Osseous metastases are present in multiple thoracic vertebrae,
multiple ribs (including the LEFT LATERAL fifth rib which has a
pathologic fracture), the head of the LEFT clavicle and the sternum.
Metastases involving the LEFT LATERAL fifth rib and the LEFT
posterolateral seventh rib demonstrate subpleural extension.

Review of the MIP images confirms the above findings.
IMPRESSION: 1. No evidence of pulmonary embolism.
2. Metastatic mediastinal and LEFT hilar lymphadenopathy.
3. The LEFT hilar nodal mass obstructs the lingular segment bronchus
of the LEFT UPPER LOBE, accounting for post-obstructive atelectasis
in the lingula.
4. Atelectasis involving the RIGHT LOWER LOBE.
5. Multiple metastases involving the visualized liver.
6. Widespread osseous metastatic disease. The most critical osseous
finding is that of a metastasis involving the LEFT posterolateral T8
vertebral body with epidural tumor extension and spinal
stenosis/cord compression at this level.

These results will be called to the ordering clinician or
representative by the Radiologist Assistant, and communication
documented in the PACS or zVision Dashboard.

ADDENDUM:
I was able to speak directly with Dr. Hard by telephone on
08/02/2018 at [DATE] p.m.

*** End of Addendum ***
FINDINGS: Cardiovascular: Contrast opacification of the pulmonary arteries is
good. No filling defects within either main pulmonary artery or
their segmental branches in either lung to suggest pulmonary
embolism. Heart size upper normal to mildly enlarged. No visible
coronary atherosclerosis. No pericardial effusion. No visible
atherosclerosis involving the thoracic or proximal abdominal aorta
or their visible branches.

Mediastinum/Nodes: Pathologic lymphadenopathy involving the
mediastinum and the LEFT hilum. Index subcarinal (station 7) nodal
mass measures approximately 1.7 x 3.8 cm. Index LEFT hilar nodal
mass measures approximately 2.8 x 2.8 cm and this mass obstructs the
lingular segment bronchus. Fluid is present in the mid and distal
esophagus without evidence of hiatal hernia. Esophagus otherwise
normal in appearance. Normal-appearing thyroid gland.

Lungs/Pleura: Obstructive atelectasis involving the lingula, related
to bronchial obstruction by the LEFT hilar mass. Linear atelectasis
involving the RIGHT LOWER LOBE. Small LEFT pleural effusion and
associated mild passive atelectasis involving the LEFT LOWER LOBE.
No pulmonary parenchymal nodules or masses. No confluent or
ground-glass airspace consolidation.

Upper Abdomen: Multiple low-attenuation masses throughout the
visualized liver, the largest measuring approximately 3 cm in the
POSTERIOR segment RIGHT lobe. Visualized upper abdomen otherwise
unremarkable for the early arterial phase of enhancement.

Musculoskeletal: Widespread osseous metastatic disease as noted on
prior MRI examination. The most critical osseous metastasis involves
the LEFT LATERAL T8 vertebral body and pedicle, with marked epidural
tumor extension at this level with associated cord compression.
There is an adjacent large metastasis involving the LEFT POSTERIOR
ninth rib which is nearly contiguous with the vertebral body
metastasis.

Osseous metastases are present in multiple thoracic vertebrae,
multiple ribs (including the LEFT LATERAL fifth rib which has a
pathologic fracture), the head of the LEFT clavicle and the sternum.
Metastases involving the LEFT LATERAL fifth rib and the LEFT
posterolateral seventh rib demonstrate subpleural extension.

Review of the MIP images confirms the above findings.
IMPRESSION: 1. No evidence of pulmonary embolism.
2. Metastatic mediastinal and LEFT hilar lymphadenopathy.
3. The LEFT hilar nodal mass obstructs the lingular segment bronchus
of the LEFT UPPER LOBE, accounting for post-obstructive atelectasis
in the lingula.
4. Atelectasis involving the RIGHT LOWER LOBE.
5. Multiple metastases involving the visualized liver.
6. Widespread osseous metastatic disease. The most critical osseous
finding is that of a metastasis involving the LEFT posterolateral T8
vertebral body with epidural tumor extension and spinal
stenosis/cord compression at this level.

These results will be called to the ordering clinician or
representative by the Radiologist Assistant, and communication
documented in the PACS or zVision Dashboard.
